# Patient Record
Sex: Female | Born: 1975
Health system: Southern US, Community
[De-identification: ages and names within clinical notes are randomized; demographics above are authoritative.]

## PROBLEM LIST (undated history)

## (undated) DIAGNOSIS — M1990 Unspecified osteoarthritis, unspecified site: Secondary | ICD-10-CM

## (undated) DIAGNOSIS — K219 Gastro-esophageal reflux disease without esophagitis: Secondary | ICD-10-CM

## (undated) DIAGNOSIS — Z87898 Personal history of other specified conditions: Secondary | ICD-10-CM

## (undated) DIAGNOSIS — D131 Benign neoplasm of stomach: Secondary | ICD-10-CM

## (undated) DIAGNOSIS — E119 Type 2 diabetes mellitus without complications: Secondary | ICD-10-CM

## (undated) DIAGNOSIS — R112 Nausea with vomiting, unspecified: Secondary | ICD-10-CM

## (undated) DIAGNOSIS — E8881 Metabolic syndrome: Secondary | ICD-10-CM

## (undated) DIAGNOSIS — M7541 Impingement syndrome of right shoulder: Secondary | ICD-10-CM

## (undated) DIAGNOSIS — R7302 Impaired glucose tolerance (oral): Secondary | ICD-10-CM

## (undated) DIAGNOSIS — K5900 Constipation, unspecified: Secondary | ICD-10-CM

## (undated) DIAGNOSIS — K635 Polyp of colon: Secondary | ICD-10-CM

## (undated) DIAGNOSIS — E785 Hyperlipidemia, unspecified: Secondary | ICD-10-CM

## (undated) DIAGNOSIS — B0089 Other herpesviral infection: Secondary | ICD-10-CM

## (undated) DIAGNOSIS — T7840XA Allergy, unspecified, initial encounter: Secondary | ICD-10-CM

## (undated) DIAGNOSIS — G4700 Insomnia, unspecified: Secondary | ICD-10-CM

## (undated) DIAGNOSIS — F419 Anxiety disorder, unspecified: Secondary | ICD-10-CM

## (undated) DIAGNOSIS — Z8639 Personal history of other endocrine, nutritional and metabolic disease: Secondary | ICD-10-CM

## (undated) DIAGNOSIS — G43909 Migraine, unspecified, not intractable, without status migrainosus: Secondary | ICD-10-CM

## (undated) DIAGNOSIS — F429 Obsessive-compulsive disorder, unspecified: Secondary | ICD-10-CM

## (undated) DIAGNOSIS — E669 Obesity, unspecified: Secondary | ICD-10-CM

## (undated) DIAGNOSIS — S0300XA Dislocation of jaw, unspecified side, initial encounter: Secondary | ICD-10-CM

## (undated) DIAGNOSIS — Z9889 Other specified postprocedural states: Secondary | ICD-10-CM

## (undated) DIAGNOSIS — E038 Other specified hypothyroidism: Secondary | ICD-10-CM

## (undated) DIAGNOSIS — E559 Vitamin D deficiency, unspecified: Secondary | ICD-10-CM

## (undated) DIAGNOSIS — I10 Essential (primary) hypertension: Secondary | ICD-10-CM

## (undated) HISTORY — PX: REDUCTION MAMMAPLASTY: SUR839

## (undated) HISTORY — DX: Hyperlipidemia, unspecified: E78.5

## (undated) HISTORY — PX: BREAST BIOPSY: SHX20

## (undated) HISTORY — DX: Benign neoplasm of stomach: D13.1

## (undated) HISTORY — PX: BREAST EXCISIONAL BIOPSY: SUR124

## (undated) HISTORY — DX: Polyp of colon: K63.5

## (undated) HISTORY — DX: Allergy, unspecified, initial encounter: T78.40XA

## (undated) HISTORY — DX: Vitamin D deficiency, unspecified: E55.9

## (undated) HISTORY — PX: WISDOM TOOTH EXTRACTION: SHX21

---

## 2006-01-11 HISTORY — PX: LASIK: SHX215

## 2008-02-12 HISTORY — PX: OTHER SURGICAL HISTORY: SHX169

## 2008-05-11 HISTORY — PX: BREAST REDUCTION SURGERY: SHX8

## 2010-01-11 HISTORY — PX: CARPAL TUNNEL RELEASE: SHX101

## 2010-11-23 ENCOUNTER — Emergency Department (HOSPITAL_COMMUNITY)
Admission: EM | Admit: 2010-11-23 | Discharge: 2010-11-23 | Disposition: A | Discharge status: Home / Self Care | Payer: 59 | Source: Home / Self Care

## 2010-11-23 ENCOUNTER — Encounter: Payer: Self-pay | Admitting: *Deleted

## 2010-11-23 DIAGNOSIS — S93499A Sprain of other ligament of unspecified ankle, initial encounter: Secondary | ICD-10-CM

## 2010-11-23 DIAGNOSIS — S86019A Strain of unspecified Achilles tendon, initial encounter: Secondary | ICD-10-CM

## 2010-11-23 HISTORY — DX: Essential (primary) hypertension: I10

## 2010-11-23 MED ORDER — IBUPROFEN 800 MG PO TABS: 800.0000 mg | ORAL_TABLET | Freq: Three times a day (TID) | ORAL | Status: AC

## 2010-11-23 NOTE — ED Notes (Signed)
Pt c/o pain to left heel that radiates up into her calf.  States last pm she went to run after her 37 year old and felt something pull in the back of her ankle that caused a burning pain up into her calf.  Has continued to have pain especially with walking.

## 2010-11-23 NOTE — ED Provider Notes (Signed)
History     CSN: [id number] Arrival date & time: 11/23/2010  3:58 PM   None     Chief Complaint  Patient presents with  . Ankle Injury    (Consider location/radiation/quality/duration/timing/severity/associated sxs/prior treatment) HPI Comments: Pt states she took of running after her 37 yo nephew and suddenly felt a pop and severe pain in the back of her Lt ankle. She is able to walk on her toes but is painful to walk normally since.   Patient is a 35 y.o. female presenting with lower extremity injury. The history is provided by the patient.  Ankle Injury This is a new problem. The current episode started 12 to 24 hours ago. The problem occurs constantly. The problem has not changed since onset.Pertinent negatives include no chest pain and no shortness of breath. The symptoms are aggravated by bending. The symptoms are relieved by rest. She has tried nothing for the symptoms.    Past Medical History  Diagnosis Date  . Hypertension   . Thyroid disease     Past Surgical History  Procedure Date  . Breast reduction surgery   . Carpal tunnel release     bilateral    No family history on file.  History  Substance Use Topics  . Smoking status: Never Smoker   . Smokeless tobacco: Not on file  . Alcohol Use: Yes     social    OB History    Grav Para Term Preterm Abortions TAB SAB Ect Mult Living                  Review of Systems  Respiratory: Negative for shortness of breath.   Cardiovascular: Negative for chest pain.  Musculoskeletal: Negative for back pain and joint swelling.  Skin: Negative for color change, rash and wound.    Allergies  Review of patient's allergies indicates no known allergies.  Home Medications   Current Outpatient Rx  Name Route Sig Dispense Refill  . BENAZEPRIL-HYDROCHLOROTHIAZIDE 20-12.5 MG PO TABS Oral Take 1 tablet by mouth daily.      Marland Kitchen LEVOTHYROXINE SODIUM 100 MCG PO TABS Oral Take 100 mcg by mouth daily.      Azzie Roup ACE-ETH  ESTRAD-FE 1-20 MG-MCG PO TABS Oral Take 1 tablet by mouth daily.      . IBUPROFEN 800 MG PO TABS Oral Take 1 tablet (800 mg total) by mouth 3 (three) times daily. 15 tablet 0    BP 132/89  Pulse 97  Temp(Src) 98 F (36.7 C) (Oral)  Resp 16  SpO2 97%  Physical Exam  Nursing note and vitals reviewed. Constitutional: She appears well-developed and well-nourished. No distress.  HENT:  Head: Normocephalic and atraumatic.  Cardiovascular: Normal rate, regular rhythm and normal heart sounds.   Pulmonary/Chest: Effort normal and breath sounds normal. No respiratory distress.  Musculoskeletal:       Left ankle: She exhibits decreased range of motion (decreased dorsiflexion due to pain). She exhibits no swelling, no ecchymosis, no deformity and normal pulse. no tenderness. No lateral malleolus, no medial malleolus, no AITFL, no CF ligament, no posterior TFL, no head of 5th metatarsal and no proximal fibula tenderness found. Achilles tendon exhibits pain. Achilles tendon exhibits no defect and normal Thompson's test results.       Left foot: She exhibits normal range of motion, no tenderness, no bony tenderness, no swelling, normal capillary refill and no deformity.  Neurological: She is alert.  Skin: Skin is warm and dry. No rash noted.  Psychiatric: She has a normal mood and affect.    ED Course  Procedures (including critical care time)  Labs Reviewed - No data to display No results found.   1. Achilles tendon sprain       MDM          Melody Comas, PA 11/23/10 1812

## 2010-11-24 NOTE — ED Provider Notes (Signed)
Medical screening examination/treatment/procedure(s) were performed by non-physician practitioner and as supervising physician I was immediately available for consultation/collaboration.   [facility]; MD   Sharin Grave, MD 11/24/10 [phone]

## 2010-12-07 ENCOUNTER — Encounter: Payer: Self-pay | Admitting: Internal Medicine

## 2010-12-07 ENCOUNTER — Other Ambulatory Visit: Payer: 59 | Admitting: Internal Medicine

## 2010-12-07 DIAGNOSIS — Z0000 Encounter for general adult medical examination without abnormal findings: Secondary | ICD-10-CM

## 2010-12-07 LAB — CBC WITH DIFFERENTIAL/PLATELET
Basophils Absolute: 0 10*3/uL (ref 0.0–0.1)
Eosinophils Absolute: 0.1 10*3/uL (ref 0.0–0.7)
Lymphocytes Relative: 23 % (ref 12–46)
Lymphs Abs: 2 10*3/uL (ref 0.7–4.0)
MCH: 30.2 pg (ref 26.0–34.0)
Neutrophils Relative %: 70 % (ref 43–77)
Platelets: 357 10*3/uL (ref 150–400)
RBC: 4.93 MIL/uL (ref 3.87–5.11)
RDW: 14.2 % (ref 11.5–15.5)
WBC: 8.8 10*3/uL (ref 4.0–10.5)

## 2010-12-07 LAB — COMPREHENSIVE METABOLIC PANEL
ALT: 12 U/L (ref 0–35)
AST: 18 U/L (ref 0–37)
CO2: 25 mEq/L (ref 19–32)
Creat: 0.84 mg/dL (ref 0.50–1.10)
Sodium: 139 mEq/L (ref 135–145)
Total Bilirubin: 0.5 mg/dL (ref 0.3–1.2)
Total Protein: 7.3 g/dL (ref 6.0–8.3)

## 2010-12-07 LAB — LIPID PANEL
Cholesterol: 248 mg/dL — ABNORMAL HIGH (ref 0–200)
LDL Cholesterol: 140 mg/dL — ABNORMAL HIGH (ref 0–99)
Total CHOL/HDL Ratio: 4.9 Ratio
VLDL: 57 mg/dL — ABNORMAL HIGH (ref 0–40)

## 2010-12-07 LAB — TSH: TSH: 0.668 u[IU]/mL (ref 0.350–4.500)

## 2010-12-08 LAB — VITAMIN D 25 HYDROXY (VIT D DEFICIENCY, FRACTURES): Vit D, 25-Hydroxy: 28 ng/mL — ABNORMAL LOW (ref 30–89)

## 2010-12-10 ENCOUNTER — Encounter: Payer: Self-pay | Admitting: Internal Medicine

## 2011-01-07 ENCOUNTER — Encounter: Payer: Self-pay | Admitting: Internal Medicine

## 2011-01-07 ENCOUNTER — Ambulatory Visit (INDEPENDENT_AMBULATORY_CARE_PROVIDER_SITE_OTHER): Payer: 59 | Admitting: Internal Medicine

## 2011-01-07 VITALS — BP 116/78 | HR 80 | Temp 98.3°F | Ht 64.5 in | Wt 237.0 lb

## 2011-01-07 DIAGNOSIS — E785 Hyperlipidemia, unspecified: Secondary | ICD-10-CM

## 2011-01-07 DIAGNOSIS — R7302 Impaired glucose tolerance (oral): Secondary | ICD-10-CM | POA: Insufficient documentation

## 2011-01-07 DIAGNOSIS — E669 Obesity, unspecified: Secondary | ICD-10-CM | POA: Insufficient documentation

## 2011-01-07 DIAGNOSIS — E039 Hypothyroidism, unspecified: Secondary | ICD-10-CM | POA: Insufficient documentation

## 2011-01-07 DIAGNOSIS — R7309 Other abnormal glucose: Secondary | ICD-10-CM

## 2011-01-07 DIAGNOSIS — Z0000 Encounter for general adult medical examination without abnormal findings: Secondary | ICD-10-CM

## 2011-01-07 DIAGNOSIS — Z131 Encounter for screening for diabetes mellitus: Secondary | ICD-10-CM

## 2011-01-07 LAB — POCT URINALYSIS DIPSTICK
Bilirubin, UA: NEGATIVE
Blood, UA: NEGATIVE
Leukocytes, UA: NEGATIVE
Nitrite, UA: NEGATIVE
Protein, UA: NEGATIVE
Urobilinogen, UA: NEGATIVE
pH, UA: 5.5

## 2011-01-07 NOTE — Patient Instructions (Signed)
Continue with same dose of Synthroid. Begin Lipitor 10 mg daily and recheck fasting lipid panel liver functions with office visit in 6 months.

## 2011-01-07 NOTE — Progress Notes (Signed)
Subjective:    Patient ID: Dana Washington, female    DOB: Jan 25, 1975, 35 y.o.   MRN: [id number]  HPI 35 year old white female moved here recently from [place]. 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 She has a sister who lives here. Parents are to move here soon. Patient was told by physician in [place] that she had hyperlipidemia. She was tried on Pravachol for a while but subsequently discontinued it. Apparently this is hereditary from her father. Also his been on Cymbalta 30 mg daily off and on for the past few years. She had weaned off of it but feels she needs to go back on it since moving was somewhat stressful. She recently purchased a house but was living temporarily with her sister prior to purchasing a house. Patient completed 4 years of college. Does not smoke or consume alcohol. History of hypothyroidism currently on Synthroid 0.1 mg daily. Had breast reduction surgery may 2010. Subsequently had bilateral CTR February 2012. Lasik surgery 2008. Hx of Hashimoto's thyroiditis with resultant hypothyroidism. Family history of early onset breast cancer. History of vitamin D deficiency. Benign Lymph node biopsy February 2010.   Family history father with history of prostate cancer, mother with history of skin cancer. Sister with history of migraine headaches.  Social history patient is single with no children    Review of Systems  Constitutional: Negative.   HENT:       Negative  Eyes:       History of Lasik surgery  Cardiovascular:       History of palpitations, history of high blood pressure  Gastrointestinal:       History of constipation  Genitourinary:       Takes oral contraceptives  Neurological: Positive for headaches.  Hematological: Negative.   Psychiatric/Behavioral:       History of anxiety       Objective:   Physical Exam  Vitals reviewed. Constitutional: She is oriented to person, place, and time.  She appears well-developed and well-nourished. No distress.  HENT:  Head: Normocephalic and atraumatic.  Right Ear: External ear normal.  Left Ear: External ear normal.  Mouth/Throat: Oropharynx is clear and moist. No oropharyngeal exudate.  Eyes: Conjunctivae and EOM are normal. Pupils are equal, round, and reactive to light. No scleral icterus.  Neck: Neck supple. No JVD present. No thyromegaly present.  Cardiovascular: Normal rate, regular rhythm and normal heart sounds.   No murmur heard. Pulmonary/Chest: Effort normal and breath sounds normal.       Breast exam: has nodules each breast to the left of each nipple about the size of a nickel. Patient says this is been diagnosed as bilateral Seromas  Abdominal: Soft. Bowel sounds are normal. She exhibits no mass. There is no tenderness.  Genitourinary:       Deferred  Musculoskeletal: Normal range of motion. She exhibits no edema.  Lymphadenopathy:    She has no cervical adenopathy.  Neurological: She is alert and oriented to person, place, and time. She has normal reflexes. No cranial nerve deficit. Coordination normal.  Skin: Skin is warm and dry. No rash noted.  Psychiatric: She has a normal mood and affect. Her behavior is normal. Judgment and thought content normal.          Assessment & Plan:  History of Hashimoto's thyroiditis according to old medical records now with hypothyroidism treated with thyroid replacement therapy  Obesity  History of breast reduction surgery  History of anxiety  treated with Cymbalta-refill Cymbalta for 12 months  Hyperlipidemia-likely familial  Plan: Patient will start Lipitor generic 10 mg daily and return in 6 months for fasting lipid panel liver functions and office visit. I believe Lipitor would be a better choice and Pravachol because it would lower both total cholesterol and triglycerides as she has elevation of both.  History of vitamin D deficiency-needs to take 2000 units vitamin D 3  daily over-the-counter.

## 2011-01-18 ENCOUNTER — Encounter: Payer: Self-pay | Admitting: Internal Medicine

## 2011-01-18 ENCOUNTER — Ambulatory Visit (INDEPENDENT_AMBULATORY_CARE_PROVIDER_SITE_OTHER): Payer: 59 | Admitting: Internal Medicine

## 2011-01-18 VITALS — BP 128/84 | HR 80 | Temp 98.5°F | Wt 238.0 lb

## 2011-01-18 DIAGNOSIS — J40 Bronchitis, not specified as acute or chronic: Secondary | ICD-10-CM

## 2011-01-18 DIAGNOSIS — J45909 Unspecified asthma, uncomplicated: Secondary | ICD-10-CM

## 2011-01-20 ENCOUNTER — Encounter: Payer: Self-pay | Admitting: Internal Medicine

## 2011-01-20 NOTE — Progress Notes (Signed)
  Subjective:    Patient ID: Dana Washington, female    DOB: 05-06-1975, 36 y.o.   MRN: [id number]  HPI Patient has been on call a lot over the new year holiday it has come down with respiratory infection around New Year's Eve. Has cough and congestion. Some discolored sputum production. Some shortness of breath. Coworkers are complaining about her cough. No fever or shaking chills. No myalgias. Has had influenza immunization at work.    Review of Systems     Objective:   Physical Exam HEENT exam: TMs and pharynx are clear; neck supple without significant adenopathy; thousand nasally congested; coughing a lot in exam room; chest clear        Assessment & Plan:  Asthmatic bronchitis  Plan: Levaquin 500 milligrams daily for 10 days. Albuterol inhaler 2 sprays by mouth 4 times a day for 2 weeks. Hycodan 8 ounces 1 teaspoon by mouth every 6 hours when necessary cough. Sterapred DS 10 mg 6 day dosepak.

## 2011-01-20 NOTE — Patient Instructions (Signed)
Take antibiotics as prescribed daily with a meal. Take cough syrup as needed sparingly for cough. Take prednisone as directed for bronchospasm. Call if not better in 10 days or sooner if worse.

## 2011-03-11 ENCOUNTER — Encounter: Payer: Self-pay | Admitting: Internal Medicine

## 2011-03-11 ENCOUNTER — Ambulatory Visit (INDEPENDENT_AMBULATORY_CARE_PROVIDER_SITE_OTHER): Payer: 59 | Admitting: Internal Medicine

## 2011-03-11 VITALS — BP 116/80 | HR 72 | Temp 97.8°F | Wt 246.0 lb

## 2011-03-11 DIAGNOSIS — R110 Nausea: Secondary | ICD-10-CM

## 2011-03-11 DIAGNOSIS — G43909 Migraine, unspecified, not intractable, without status migrainosus: Secondary | ICD-10-CM

## 2011-03-11 MED ORDER — ONDANSETRON HCL 4 MG/2ML IJ SOLN
4.0000 mg | Freq: Once | INTRAMUSCULAR | Status: AC
  Administered 2011-03-11: 4 mg via INTRAMUSCULAR

## 2011-03-30 ENCOUNTER — Telehealth: Payer: Self-pay | Admitting: Internal Medicine

## 2011-03-30 MED ORDER — FROVATRIPTAN SUCCINATE 2.5 MG PO TABS: 2.5000 mg | ORAL_TABLET | ORAL | Status: AC | PRN

## 2011-03-30 NOTE — Telephone Encounter (Signed)
Cannot tolerate Imitrex. Call in Frova tablets to hospital pharmacy. Rx written for 90 day supply with one refill.

## 2011-04-08 ENCOUNTER — Ambulatory Visit (INDEPENDENT_AMBULATORY_CARE_PROVIDER_SITE_OTHER): Payer: 59 | Admitting: Internal Medicine

## 2011-04-08 ENCOUNTER — Encounter: Payer: Self-pay | Admitting: Internal Medicine

## 2011-04-08 VITALS — BP 112/66 | HR 80 | Temp 98.3°F

## 2011-04-08 DIAGNOSIS — R51 Headache: Secondary | ICD-10-CM

## 2011-04-08 DIAGNOSIS — Z8669 Personal history of other diseases of the nervous system and sense organs: Secondary | ICD-10-CM

## 2011-04-08 NOTE — Patient Instructions (Addendum)
Try Zomig nasal spray for migraine headache instead of Frova. Try one Fioricet tablet as a rescue medication. Had onset of headache try to Aleve tablets and didn't move to triptan medication if no relief within the hour.

## 2011-04-08 NOTE — Progress Notes (Signed)
  Subjective:    Patient ID: Dana Washington, female    DOB: 1975/12/20, 36 y.o.   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 Currently on Topamax 50 mg daily. Currently having only one migraine a week worse previously she was having 3-4 week. She tried Frova tablets after trying Imitrex nasal spray which gave her chest pain. Imitrex nasal spray gave her better relief but the chest pain frightened her. Frova did fairly well but didn't give her complete relief of the headache. She did try Fioricet as a rescue medication. She took wanted and said it helped a bit but then when she took 2 she felt "drunk". She still not clear if she can mix ibuprofen with Frova with Fioricet. Explained how she could use these different medications today. At onset of headache she should take 2 Aleve or 600 mg of ibuprofen. Within the hour she's not getting relief, she should try Frova or Zomig nasal spray which I have prescribed today 5 mg. If no relief or incomplete relief after that she should then go to Fioricet as a rescue medication.    Review of Systems     Objective:   Physical Exam patient not examined today. Spent 20 minutes talking with her about how to manage these headaches and what medications can be mixed safely. She does have Phenergan on hand for nausea. Does feel that Topamax is helping a great deal.        Assessment & Plan:  Migraine headaches  Plan: Increase Topamax to 75 mg daily written prescription for #90 with one refill. Return in 2 months for followup on management. Try Zomig nasal spray 5 mg instead of Frova to see if pain relief will be better. For rescue medicine, try Fioricet 1 tablet instead of 2. At onset of headache try 2 Aleve and then proceed to triptan medication if not better within the hour.

## 2011-04-09 NOTE — Patient Instructions (Signed)
Start Topamax 25 mg daily increasing after one to 2 weeks to 50 mg daily. Return in 4 weeks. Try Imitrex tablets at onset of headache. Try Fioricet as rescue medication one or 2 tablets if headache not relieved by Imitrex

## 2011-04-09 NOTE — Progress Notes (Signed)
  Subjective:    Patient ID: Trellis Moment, female    DOB: [birth year], 36 y.o.   MRN: [id number]  HPI 36 year old white female presents today with a headache associated with photophobiaand nausea.some years ago she saw a neurologist and apparently was diagnosed with migraine headaches and then they went away for a while. Has been under some stress recently with moving and with excepting a new job. Has been having very frequent headaches.    Review of Systems     Objective:   Physical Exam funduscopic exam is benign. Disc are sharp and flat bilaterally. Brief neurological exam shows no focal deficits.Spent 25 minutes talking with patient about headaches, triggers for headaches, and management of headaches        Assessment & Plan:  Migraine headache  Plan: Patient may try Imitrex 100 mg tablet at onset of migraine. Since they have been very frequent, we  want to use prophylaxis with Topamax starting initially with 25 mg increasing to 50 mg after one to 2 weeks.return in about 4 weeks for reevaluation. For rescue medication for headache not relieved with Imitrex, try Fioricet 1 or 2 tablets every 4-6 hours not to exceed 6 tablets in 24 hours.

## 2011-04-29 ENCOUNTER — Telehealth: Payer: Self-pay | Admitting: Internal Medicine

## 2011-04-29 DIAGNOSIS — G43909 Migraine, unspecified, not intractable, without status migrainosus: Secondary | ICD-10-CM

## 2011-04-29 NOTE — Telephone Encounter (Signed)
Called in Sterapred dosepack for pt.  Advised pt to keep prev scheduled appt.

## 2011-04-29 NOTE — Telephone Encounter (Signed)
Call in Sterapred DS 10 mg 6 day dosepack Take as directed. And keep followup appointment.

## 2011-06-11 ENCOUNTER — Ambulatory Visit (INDEPENDENT_AMBULATORY_CARE_PROVIDER_SITE_OTHER): Payer: 59 | Admitting: Internal Medicine

## 2011-06-11 ENCOUNTER — Encounter: Payer: Self-pay | Admitting: Internal Medicine

## 2011-06-11 VITALS — BP 122/76 | HR 92 | Temp 98.6°F | Wt 252.0 lb

## 2011-06-11 DIAGNOSIS — F418 Other specified anxiety disorders: Secondary | ICD-10-CM | POA: Insufficient documentation

## 2011-06-11 DIAGNOSIS — Z8669 Personal history of other diseases of the nervous system and sense organs: Secondary | ICD-10-CM

## 2011-06-11 DIAGNOSIS — G43909 Migraine, unspecified, not intractable, without status migrainosus: Secondary | ICD-10-CM

## 2011-06-11 DIAGNOSIS — F32A Depression, unspecified: Secondary | ICD-10-CM | POA: Insufficient documentation

## 2011-06-11 DIAGNOSIS — F329 Major depressive disorder, single episode, unspecified: Secondary | ICD-10-CM

## 2011-06-11 DIAGNOSIS — I10 Essential (primary) hypertension: Secondary | ICD-10-CM

## 2011-06-11 DIAGNOSIS — F3289 Other specified depressive episodes: Secondary | ICD-10-CM

## 2011-06-11 DIAGNOSIS — E669 Obesity, unspecified: Secondary | ICD-10-CM

## 2011-06-11 MED ORDER — DULOXETINE HCL 60 MG PO CPEP: 60.0000 mg | ORAL_CAPSULE | Freq: Every day | ORAL | Status: AC

## 2011-06-11 NOTE — Progress Notes (Signed)
  Subjective:    Patient ID: Dana Washington, female    DOB: [birth year], 36 y.o.   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 Also has been a bit depressed recently. Somewhat homesick for [facility]. Her sister lives here. Patient recently bought a house here. However still misses her friends and family. Is working a great deal. Is on call this weekend. Has had fewer migraines on Topamax but the migraines seem to be more severe. They're waking her up in the middle of the night. She has missed one day of work since last visit. On further questioning, she is eating only a bowl of cereal sometimes for dinner. I suspect she is getting somewhat hypoglycemic and waking up in the middle of the night with a migraine triggered by that. Blood pressures under good control on current regimen. Benazepril/HCTZ 20/12.5 refilled today for when necessary one year. She says her sister thinks her Cymbalta needs to be increased. Think she is depressed.    Review of Systems     Objective:   Physical Exam somewhat tearful in the office today talking about her hometown. Chest clear; cardiac exam regular rate and rhythm; extremities without edema        Assessment & Plan:  Hypertension  Obesity  Migraine headaches  Depression  Plan: Patient has appointment have physical exam next month so we can followup with headaches and other issues at that time. Increase Cymbalta to 60 mg daily. New prescription written. Topamax 100 mg at bedtime new prescription written #90 with with 2 refills. Patient is to try to eat more calories for supper and less calories for lunch. Keep headache diary until return.

## 2011-06-11 NOTE — Patient Instructions (Signed)
Take Topamax 100 mg at bedtime. Increase Cymbalta from 30-60 mg daily. Return next month for physical exam. Continue antihypertensive medication. Try to eat more calories at suppertime

## 2011-06-14 ENCOUNTER — Encounter: Payer: Self-pay | Admitting: Internal Medicine

## 2011-06-14 ENCOUNTER — Ambulatory Visit (INDEPENDENT_AMBULATORY_CARE_PROVIDER_SITE_OTHER): Payer: 59 | Admitting: Internal Medicine

## 2011-06-14 DIAGNOSIS — L259 Unspecified contact dermatitis, unspecified cause: Secondary | ICD-10-CM

## 2011-06-14 DIAGNOSIS — L309 Dermatitis, unspecified: Secondary | ICD-10-CM

## 2011-06-14 NOTE — Progress Notes (Signed)
  Subjective:    Patient ID: Dana Washington, female    DOB: 02-May-1975, 36 y.o.   MRN: [id number]  HPI 35 year old white female with history of migraine headaches and depression was just seen here May 31 for followup on headaches and depression. 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 She noticed an area of erythema adjacent to her left nasal bridge running under her left eye. She is not aware of any recent insect bite. A few weeks ago she did have contact dermatitis consistent with poison ivy on her arms. Has not been working in the yard lately. She did clean up her house over the weekend. Says the rash feels irritated with a burning sensation. No fever.    Review of Systems     Objective:   Physical Exam  there is an area of erythema running from left para nasal bridge area under left eye. There is some swelling adjacent to the left nasal bridge. In that area there is a tiny weeping area. No other distinct vesicles.        Assessment & Plan:  Possible Herpes zoster  Possible Herpes simplex  Possible insect bite with cellulitis  Plan: Valtrex generic 1 g 3 times daily for 7 days. Levaquin 500 milligrams daily for 7 days. Patient is to return tomorrow afternoon for brief recheck. Currently her eye is not involved.

## 2011-06-14 NOTE — Patient Instructions (Addendum)
Take Valtrex 1 g 3 times daily for 7 days. Start Levaquin 500 mg daily with a meal for 7 days. We're attempting to cover any herpes infection as well as cellulitis. Return tomorrow for recheck.

## 2011-06-26 ENCOUNTER — Ambulatory Visit (INDEPENDENT_AMBULATORY_CARE_PROVIDER_SITE_OTHER): Payer: 59 | Admitting: Family Medicine

## 2011-06-26 VITALS — BP 127/84 | HR 65 | Temp 98.5°F | Resp 16 | Ht 64.75 in | Wt 254.0 lb

## 2011-06-26 DIAGNOSIS — H5789 Other specified disorders of eye and adnexa: Secondary | ICD-10-CM

## 2011-06-26 DIAGNOSIS — H579 Unspecified disorder of eye and adnexa: Secondary | ICD-10-CM

## 2011-06-26 MED ORDER — POLYMYXIN B-TRIMETHOPRIM 10000-0.1 UNIT/ML-% OP SOLN: 1.0000 [drp] | OPHTHALMIC | Status: DC

## 2011-06-26 NOTE — Progress Notes (Signed)
Patient Name: Dana Washington Date of Birth: 1975/09/23 Medical Record Number: [id number] Gender: female Date of Encounter: 06/26/2011  History of Present Illness:  Dana Washington is a 36 y.o. very pleasant female patient who presents with the following:  Awoke with right eye swelling this morning.  The swelling seems to have gone down some since she woke.  It eyes is "burning" but there is no crusting, no lesions. She does note increased tearing.  No corrective lenses- vision is at baseline.  She did cut the grass yesterday . Marland Kitchen It was hot and dusty but she did not have sunscreen on her face.   No photophobia, no pain.  Patient Active Problem List  Diagnosis  . Hyperlipidemia  . Impaired glucose tolerance  . Obesity  . Hypothyroidism  . History of migraine headaches  . Depression   Past Medical History  Diagnosis Date  . Hypertension   . Thyroid disease   . Vitamin d deficiency   . Hyperlipidemia    Past Surgical History  Procedure Date  . Breast reduction surgery   . Carpal tunnel release     bilateral  . Biopsy of lymph node    History  Substance Use Topics  . Smoking status: Never Smoker   . Smokeless tobacco: Never Used  . Alcohol Use: Yes     social   Family History  Problem Relation Age of Onset  . Heart disease Maternal Grandfather   . Heart disease Paternal Grandmother    No Known Allergies  Medication list has been reviewed and updated.  Prior to Admission medications   Medication Sig Start Date End Date Taking? Authorizing Provider  atorvastatin (LIPITOR) 10 MG tablet Take 10 mg by mouth daily.     Yes Historical Provider, MD  benazepril-hydrochlorthiazide (LOTENSIN HCT) 20-12.5 MG per tablet Take 1 tablet by mouth daily.     Yes Historical Provider, MD  butalbital-acetaminophen-caffeine (ESGIC PLUS) 50-500-40 MG per tablet Take 1 tablet by mouth every 4 (four) hours as needed.   Yes Historical Provider, MD  DULoxetine (CYMBALTA) 60 MG  capsule Take 1 capsule (60 mg total) by mouth daily. 06/11/11 06/10/12 Yes Margaree Mackintosh, MD  levothyroxine (SYNTHROID, LEVOTHROID) 100 MCG tablet Take 100 mcg by mouth daily.     Yes Historical Provider, MD  norethindrone-ethinyl estradiol (JUNEL FE,GILDESS FE,LOESTRIN FE) 1-20 MG-MCG tablet Take 1 tablet by mouth daily.     Yes Historical Provider, MD  topiramate (TOPAMAX) 50 MG tablet Take 50 mg by mouth daily.   Yes Historical Provider, MD  vitamin E (VITAMIN E) 400 UNIT capsule Take 400 Units by mouth daily.     Yes Historical Provider, MD  frovatriptan (FROVA) 2.5 MG tablet Take 1 tablet (2.5 mg total) by mouth as needed for migraine. If recurs, may repeat after 2 hours. Max of 3 tabs in 24 hours. 03/30/11 03/29/12  Margaree Mackintosh, MD    Review of Systems:  As per HPI- otherwise negative.  Physical Examination: Filed Vitals:   06/26/11 0926  BP: 127/84  Pulse: 65  Temp: 98.5 F (36.9 C)  Resp: 16   Filed Vitals:   06/26/11 0926  Height: 5' 4.75" (1.645 m)  Weight: 254 lb (115.214 kg)   Body mass index is 42.59 kg/(m^2). Ideal Body Weight: Weight in (lb) to have BMI = 25: 148.8   GEN: WDWN, NAD, Non-toxic, A & O x 3, obese HEENT: Atraumatic, Normocephalic. Neck supple. No masses, No LAD. Eyes:  PEERL, EOMI.  Fundoscopic exam wnl, fluorescin normal.  There is slight swelling of the medial corner of her eye, but this is minimal.  The lid appears normal, no evidence of stye.  conjunctivae are wnl.  Ears and Nose: No external deformity. CV: RRR, No M/G/R. No JVD. No thrill. No extra heart sounds. PULM: CTA B, no wheezes, crackles, rhonchi. No retractions. No resp. distress. No accessory muscle use. EXTR: No c/c/e NEURO Normal gait.  PSYCH: Normally interactive. Conversant. Not depressed or anxious appearing.  Calm demeanor.   Assessment and Plan: 1. Eye irritation  trimethoprim-polymyxin b (POLYTRIM) ophthalmic solution  Dana Washington has minimal swelling of the medial corner of her  right eye which is already significantly better.  No treatment may be needed.  She has an rx for polytrim as above, which she can use if her symptoms persists.  However, if she is well within the next day or so she may not even need to fill the drops.  If she develops any worsening or new symptoms she will give me a call.    Abbe Amsterdam, MD

## 2011-07-02 ENCOUNTER — Other Ambulatory Visit: Payer: Self-pay | Admitting: Internal Medicine

## 2011-07-03 ENCOUNTER — Ambulatory Visit (INDEPENDENT_AMBULATORY_CARE_PROVIDER_SITE_OTHER): Payer: 59 | Admitting: Family Medicine

## 2011-07-03 VITALS — BP 122/84 | HR 74 | Temp 98.8°F | Resp 16 | Ht 64.25 in | Wt 255.0 lb

## 2011-07-03 DIAGNOSIS — H5710 Ocular pain, unspecified eye: Secondary | ICD-10-CM

## 2011-07-03 DIAGNOSIS — T6391XA Toxic effect of contact with unspecified venomous animal, accidental (unintentional), initial encounter: Secondary | ICD-10-CM

## 2011-07-03 DIAGNOSIS — T63441A Toxic effect of venom of bees, accidental (unintentional), initial encounter: Secondary | ICD-10-CM

## 2011-07-03 MED ORDER — PREDNISONE 50 MG PO TABS: ORAL_TABLET | ORAL | Status: AC

## 2011-07-03 MED ORDER — METHYLPREDNISOLONE SODIUM SUCC 125 MG IJ SOLR
125.0000 mg | Freq: Once | INTRAMUSCULAR | Status: AC
  Administered 2011-07-03: 125 mg via INTRAMUSCULAR

## 2011-07-03 MED ORDER — FAMOTIDINE 40 MG PO TABS: 40.0000 mg | ORAL_TABLET | Freq: Two times a day (BID) | ORAL | Status: DC | PRN

## 2011-07-03 MED ORDER — RANITIDINE HCL 150 MG/10ML PO SYRP
150.0000 mg | ORAL_SOLUTION | Freq: Once | ORAL | Status: AC
  Administered 2011-07-03: 150 mg via ORAL

## 2011-07-03 NOTE — Patient Instructions (Signed)
Bee, Wasp, or Hornet Sting   Your caregiver has diagnosed you as having an insect sting. An insect sting appears as a red lump in the skin that sometimes has a tiny hole in the center, or it may have a stinger in the center of the wound. The most common stings are from wasps, hornets and bees.   Individuals have different reactions to insect stings.   A normal reaction may cause pain, swelling, and redness around the sting site.   A localized allergic reaction may cause swelling and redness that extends beyond the sting site.   A large local reaction may continue to develop over the next 12 to 36 hours.   On occasion, the reactions can be severe (anaphylactic reaction). An anaphylactic reaction may cause wheezing; difficulty breathing; chest pain; fainting; raised, itchy, red patches on the skin; a sick feeling to your stomach (nausea); vomiting; cramping; or diarrhea. If you have had an anaphylactic reaction to an insect sting in the past, you are more likely to have one again.   HOME CARE INSTRUCTIONS   With bee stings, a small sac of poison is left in the wound. Brushing across this with something such as a credit card, or anything similar, will help remove this and decrease the amount of the reaction. This same procedure will not help a wasp sting as they do not leave behind a stinger and poison sac.   Apply a cold compress for 10 to 20 minutes every hour for 1 to 2 days, depending on severity, to reduce swelling and itching.   To lessen pain, a paste made of water and baking soda may be rubbed on the bite or sting and left on for 5 minutes.   To relieve itching and swelling, you may use take medication or apply medicated creams or lotions as directed.   Only take over-the-counter or prescription medicines for pain, discomfort, or fever as directed by your caregiver.   Wash the sting site daily with soap and water. Apply antibiotic ointment on the sting site as directed.   If you suffered a severe reaction:   If  you did not require hospitalization, an adult will need to stay with you for 24 hours in case the symptoms return.   You may need to wear a medical bracelet or necklace stating the allergy.   You and your family need to learn when and how to use an anaphylaxis kit or epinephrine injection.   If you have had a severe reaction before, always carry your anaphylaxis kit with you.   SEEK MEDICAL CARE IF:   None of the above helps within 2 to 3 days.   The area becomes red, warm, tender, and swollen beyond the area of the bite or sting.   You have an oral temperature above 102° F (38.9° C).   SEEK IMMEDIATE MEDICAL CARE IF:   You have symptoms of an allergic reaction which are:   Wheezing.   Difficulty breathing.   Chest pain.   Lightheadedness or fainting.   Itchy, raised, red patches on the skin.   Nausea, vomiting, cramping or diarrhea.   ANY OF THESE SYMPTOMS MAY REPRESENT A SERIOUS PROBLEM THAT IS AN EMERGENCY. Do not wait to see if the symptoms will go away. Get medical help right away. Call your local emergency services (911 in [place] DO NOT drive yourself to the hospital.   MAKE SURE YOU:   Understand these instructions.   Will watch your condition.     Will get help right away if you are not doing well or get worse.   Document Released: 12/28/2004 Document Revised: 12/17/2010 Document Reviewed: 06/14/2009   ExitCare® Patient Information ©2012 [employer].

## 2011-07-03 NOTE — Progress Notes (Signed)
  Subjective:    Patient ID: Dana Washington, female    DOB: 04-17-1975, 36 y.o.   MRN: [id number]  HPI Pt was stung by yellowjacket in L eye yesterday.  Pt was outside in garden, also cutting grass Pt thinks that she may have hit yellowjacket nest.  Both pt and dog were stung.  Pt has had significant L eye swelling and secondary LAD.  No LOV.  No headache.  No fevers.  Has been taking scheduled benadryl with minimal relief in itching.  No nuchal rigidity. Some pain with opening jaw on L side.    Review of Systems See HPI, otherwise ROS negative    Objective:   Physical Exam  Constitutional: She appears well-developed and well-nourished.  HENT:  Head: Normocephalic.    Right Ear: External ear normal.  Left Ear: External ear normal.  Mouth/Throat: Oropharynx is clear and moist.       Jaw w/ full ROM   Eyes: Conjunctivae and EOM are normal. Pupils are equal, round, and reactive to light.  Neck: Normal range of motion. Neck supple.  Cardiovascular: Normal rate and regular rhythm.   Pulmonary/Chest: Effort normal.  Abdominal: Soft. Bowel sounds are normal.     L eye peripheral swelling; eye w/ full ROM  PERRL      Assessment & Plan:  Yellowjacket sting:  solumedrol 125 IM x1. pepcid and benadryl.  Prednisone x 5 days.  Infectious red flags discussed.  Follow up in 24 hours.  Handout given.     The patient and/or caregiver has been counseled thoroughly with regard to treatment plan and/or medications prescribed including dosage, schedule, interactions, rationale for use, and possible side effects and they verbalize understanding. Diagnoses and expected course of recovery discussed and will return if not improved as expected or if the condition worsens. Patient and/or caregiver verbalized understanding.

## 2011-07-04 ENCOUNTER — Telehealth: Payer: Self-pay

## 2011-07-04 NOTE — Telephone Encounter (Signed)
DR.NEWTON, PT WOULD LIKE TO INFORM YOU THAT SHE WILL NOT BE ABLE TO COME IN TODAY DUE TO WORK. SHE WOULD LIKE TO LET YOU KNOW THAT SHE  IS DOING BETTER, SWELLING HAS WENT DOWN AND THERE IS NO REDNESS AT THIS TIME. SHE ALSO WOULD LIKE TO THANK YOU FOR DOING SUCH A GREAT JOB.

## 2011-07-06 ENCOUNTER — Other Ambulatory Visit: Payer: 59 | Admitting: Internal Medicine

## 2011-07-06 DIAGNOSIS — Z79899 Other long term (current) drug therapy: Secondary | ICD-10-CM

## 2011-07-06 DIAGNOSIS — E785 Hyperlipidemia, unspecified: Secondary | ICD-10-CM

## 2011-07-06 LAB — HEPATIC FUNCTION PANEL
Alkaline Phosphatase: 82 U/L (ref 39–117)
Bilirubin, Direct: 0.1 mg/dL (ref 0.0–0.3)
Indirect Bilirubin: 0.3 mg/dL (ref 0.0–0.9)
Total Bilirubin: 0.4 mg/dL (ref 0.3–1.2)

## 2011-07-06 LAB — LIPID PANEL
Cholesterol: 187 mg/dL (ref 0–200)
Triglycerides: 174 mg/dL — ABNORMAL HIGH (ref <150)

## 2011-07-08 ENCOUNTER — Other Ambulatory Visit: Payer: 59 | Admitting: Internal Medicine

## 2011-07-09 ENCOUNTER — Ambulatory Visit (INDEPENDENT_AMBULATORY_CARE_PROVIDER_SITE_OTHER): Payer: 59 | Admitting: Internal Medicine

## 2011-07-09 ENCOUNTER — Encounter: Payer: Self-pay | Admitting: Internal Medicine

## 2011-07-09 VITALS — BP 112/70 | HR 76 | Temp 98.4°F | Ht 64.5 in | Wt 255.0 lb

## 2011-07-09 DIAGNOSIS — I10 Essential (primary) hypertension: Secondary | ICD-10-CM

## 2011-07-09 DIAGNOSIS — E785 Hyperlipidemia, unspecified: Secondary | ICD-10-CM

## 2011-07-09 DIAGNOSIS — E039 Hypothyroidism, unspecified: Secondary | ICD-10-CM

## 2011-07-09 DIAGNOSIS — G43909 Migraine, unspecified, not intractable, without status migrainosus: Secondary | ICD-10-CM

## 2011-07-09 DIAGNOSIS — T63481A Toxic effect of venom of other arthropod, accidental (unintentional), initial encounter: Secondary | ICD-10-CM

## 2011-07-09 DIAGNOSIS — IMO0001 Reserved for inherently not codable concepts without codable children: Secondary | ICD-10-CM

## 2011-07-10 DIAGNOSIS — I10 Essential (primary) hypertension: Secondary | ICD-10-CM | POA: Insufficient documentation

## 2011-07-10 NOTE — Patient Instructions (Addendum)
Carry Ana-Kit with you in your car at all times. Use if have recurrent insect stings. Continue Topamax. Continue medications for hypothyroidism, hyperlipidemia and hypertension. Return in 6 months for physical examination.

## 2011-07-10 NOTE — Progress Notes (Signed)
  Subjective:    Patient ID: Trellis Moment, female    DOB: 03/16/1975, 36 y.o.   MRN: [id number]  HPI 36 year old white female in today for six-month recheck of hypertension and hyperlipidemia. Had recent fasting lipid panel which proved to be essentially normal. She was pleased with that. However, on June 21 she was stung by several yellow jackets while mowing her yard. She took some Benadryl. Had swelling of her left eye. Multiple stings on the face. She is  concerned she still has some swelling of her left eyelid. She went to urgent care and was given prednisone to take which she is just finishing up. Urgent care told her she probably needed to get Ana- kit(Epipen). Patient complaining that bites are itchy. Has continued to take Benadryl for itching.  With regard to migraine headache she is now up Topamax 100 mg daily and is doing better with regard to number of headaches per month.    Review of Systems     Objective:   Physical Exam  The patient has mild swelling left eyelid. There is no erythema about her face whatsoever. Has slight swelling left mid eyebrow area. No evidence of cellulitis. No evidence of conjunctivitis.  Chest clear to auscultation; cardiac exam regular rate and rhythm; extremities without edema; neurological exam alert and oriented x3. No focal deficits on brief neurological exam. Tongue is not swollen. Pharynx is clear.       Assessment & Plan:  Multiple yellow jacket stings on face. No evidence of cellulitis  History of migraine headaches-improved with Topamax  Hypertension  Hyperlipidemia  Plan: Patient is to return in 6 months for physical examination. Prescription for Ana-Kit written with when necessary one year refills.

## 2011-08-17 ENCOUNTER — Encounter: Payer: Self-pay | Admitting: Internal Medicine

## 2011-08-17 ENCOUNTER — Ambulatory Visit (INDEPENDENT_AMBULATORY_CARE_PROVIDER_SITE_OTHER): Payer: 59 | Admitting: Internal Medicine

## 2011-08-17 VITALS — BP 100/68 | HR 84 | Temp 97.5°F | Wt 257.0 lb

## 2011-08-17 DIAGNOSIS — L259 Unspecified contact dermatitis, unspecified cause: Secondary | ICD-10-CM

## 2011-08-26 ENCOUNTER — Encounter: Payer: Self-pay | Admitting: Internal Medicine

## 2011-08-26 NOTE — Progress Notes (Signed)
  Subjective:    Patient ID: Dana Washington, female    DOB: 1975-07-27, 36 y.o.   MRN: [id number]  HPI 36 year old white female with history of migraine headaches, depression, obesity, hypertension, hyperlipidemia, hypothyroidism and impaired glucose tolerance in today with rash on her arms. Does her own yard work. Rash is itchy and she thinks it might be poison ivy.    Review of Systems     Objective:   Physical Exam raised red papules with linear distribution both arms. No bullous lesions. No rash on face.        Assessment & Plan:  Contact dermatitis-likely poison ivy  Plan: Sterapred DS 10 mg 12 day dosepak  Atarax 25 mg one or 2 tablets 4 times daily as needed for itching

## 2011-08-26 NOTE — Patient Instructions (Addendum)
Take Sterapred DS 10 mg 12 day dosepak for contact dermatitis as directed. Take Atarax as needed for itching

## 2011-08-30 ENCOUNTER — Other Ambulatory Visit: Payer: Self-pay | Admitting: Internal Medicine

## 2011-08-30 ENCOUNTER — Encounter: Payer: Self-pay | Admitting: Internal Medicine

## 2011-08-30 ENCOUNTER — Ambulatory Visit (INDEPENDENT_AMBULATORY_CARE_PROVIDER_SITE_OTHER): Payer: 59 | Admitting: Internal Medicine

## 2011-08-30 VITALS — BP 116/86 | HR 92 | Temp 97.7°F | Wt 255.0 lb

## 2011-08-30 DIAGNOSIS — B029 Zoster without complications: Secondary | ICD-10-CM

## 2011-08-30 DIAGNOSIS — B0239 Other herpes zoster eye disease: Secondary | ICD-10-CM

## 2011-08-30 NOTE — Patient Instructions (Addendum)
Start Valtrex 1 g 3 times daily for 7 days. We will refer you to ophthalmologist for evaluation

## 2011-08-30 NOTE — Progress Notes (Signed)
  Subjective:    Patient ID: Dana Washington, female    DOB: 02-22-1975, 36 y.o.   MRN: [id number]  HPI 37 year old white female awakened this morning with rash on left temple area. She also has erythematous  papule left eyelid. No drainage from the eye. Says rash feels irritated and is not itchy. She has a history of migraine headaches. No fever. No headache.    Review of Systems     Objective:   Physical Exam several papular blotches left temple area and left eyelid. No vesicles.        Assessment & Plan:  Herpes zoster left face and eyelid  Plan: Ophthalmology consultation to make sure she has no involvement in the eye  Valtrex 1 g 3 times daily for 7 days.

## 2011-09-03 ENCOUNTER — Telehealth: Payer: Self-pay | Admitting: Internal Medicine

## 2011-09-03 NOTE — Telephone Encounter (Signed)
Sp w/pharmacy in [place] - Gabapentin 300 mg #30 tid no refills.  Sp w/patient to advise this has been called in per instructions in message by MJB.

## 2011-09-03 NOTE — Telephone Encounter (Signed)
It has some side effects. May try Gabapentin 300mg  tid #30. Narcotics do not work for nerve root pain.

## 2011-12-08 ENCOUNTER — Other Ambulatory Visit: Payer: Self-pay | Admitting: Internal Medicine

## 2011-12-20 ENCOUNTER — Encounter: Payer: Self-pay | Admitting: Internal Medicine

## 2011-12-20 ENCOUNTER — Ambulatory Visit (INDEPENDENT_AMBULATORY_CARE_PROVIDER_SITE_OTHER): Payer: 59 | Admitting: Internal Medicine

## 2011-12-20 VITALS — BP 130/90 | HR 92 | Temp 98.9°F | Wt 263.0 lb

## 2011-12-20 DIAGNOSIS — F4320 Adjustment disorder, unspecified: Secondary | ICD-10-CM

## 2011-12-20 DIAGNOSIS — B029 Zoster without complications: Secondary | ICD-10-CM

## 2011-12-20 DIAGNOSIS — F419 Anxiety disorder, unspecified: Secondary | ICD-10-CM

## 2011-12-20 DIAGNOSIS — F411 Generalized anxiety disorder: Secondary | ICD-10-CM

## 2011-12-20 DIAGNOSIS — F4321 Adjustment disorder with depressed mood: Secondary | ICD-10-CM

## 2011-12-20 NOTE — Progress Notes (Signed)
  Subjective:    Patient ID: Dana Washington, female    DOB: 1975/04/01, 36 y.o.   MRN: [id number]  HPI 60 white female seen in mid August with dermatitis left face consistent with herpes zoster. Was treated with Valtrex for 7 days and improved. Was sent to ophthalmologist to rule out ophthalmic involvement. She saw Dr. Hazle Quant who said she had no ophthalmic involvement. Since then patient has had some intermittent burning in her left face.  Over Thanksgiving, she had to put her dog to sleep who apparently had had a stroke. This was extremely stressful. She had the dog for 8 years. Yesterday she awakened with a left facial rash once again with similar distribution of the initial outbreak. She had some of her father's Valtrex and begin taking that has had a total of 4 doses. Says the face is itchy and burning.  She is clearly anxious and grieving over loss of her dog.  When she had initial outbreak of shingles, she had a dermatology appointment for some other skin issues and dermatologist thought she also had herpes zoster at that time.    Review of Systems     Objective:   Physical Exam Erythematous macular papular splotchy rash left face. No pustules, no vesicles, no crusting. She has photos on her i-phone from her previous bout showing canal the rash progressed. It included crusting and vesicles. Spoke with patient for 25 minutes about grief reaction from loss of dog and other symptoms she is having at present time.       Assessment & Plan:  I think it would be unusual to have a recurrence of herpes zoster at this point. Am beginning to wonder if this is herpes simplex instead. Going to place her back on Valtrex 1 g 3 times daily for 7 days and then place her on suppression therapy with Valtrex 500 mg daily for 6 months. It does sound like she has had  a postherpetic neuralgia with the symptoms of burning after healing from the initial outbreak. We discussed possibly putting her on gabapentin  if the symptoms persist. For anxiety gave her Xanax 0.5 mg (#60) one half to one by mouth twice daily with no refill.

## 2011-12-20 NOTE — Patient Instructions (Addendum)
Take Valtrex 1 g 3 times daily for 7 days then 500 mg daily for 6 months. Take Xanax as needed for anxiety.

## 2012-01-25 ENCOUNTER — Other Ambulatory Visit: Payer: 59 | Admitting: Internal Medicine

## 2012-01-25 DIAGNOSIS — E785 Hyperlipidemia, unspecified: Secondary | ICD-10-CM

## 2012-01-25 DIAGNOSIS — E039 Hypothyroidism, unspecified: Secondary | ICD-10-CM

## 2012-01-25 DIAGNOSIS — Z0000 Encounter for general adult medical examination without abnormal findings: Secondary | ICD-10-CM

## 2012-01-25 LAB — COMPREHENSIVE METABOLIC PANEL
Albumin: 3.8 g/dL (ref 3.5–5.2)
Alkaline Phosphatase: 82 U/L (ref 39–117)
BUN: 12 mg/dL (ref 6–23)
Glucose, Bld: 85 mg/dL (ref 70–99)
Potassium: 3.9 mEq/L (ref 3.5–5.3)

## 2012-01-25 LAB — CBC WITH DIFFERENTIAL/PLATELET
Basophils Relative: 1 % (ref 0–1)
Eosinophils Absolute: 0.1 10*3/uL (ref 0.0–0.7)
Eosinophils Relative: 1 % (ref 0–5)
HCT: 41.4 % (ref 36.0–46.0)
Hemoglobin: 13.9 g/dL (ref 12.0–15.0)
MCH: 29.6 pg (ref 26.0–34.0)
MCHC: 33.6 g/dL (ref 30.0–36.0)
MCV: 88.3 fL (ref 78.0–100.0)
Monocytes Absolute: 0.6 10*3/uL (ref 0.1–1.0)
Monocytes Relative: 7 % (ref 3–12)
Neutro Abs: 5.5 10*3/uL (ref 1.7–7.7)

## 2012-01-25 LAB — LIPID PANEL
HDL: 51 mg/dL (ref >39)
LDL Cholesterol: 91 mg/dL (ref 0–99)
Triglycerides: 188 mg/dL — ABNORMAL HIGH (ref <150)

## 2012-01-27 ENCOUNTER — Other Ambulatory Visit: Payer: 59 | Admitting: Internal Medicine

## 2012-01-28 ENCOUNTER — Encounter: Payer: 59 | Admitting: Internal Medicine

## 2012-02-09 ENCOUNTER — Telehealth: Payer: Self-pay | Admitting: Internal Medicine

## 2012-02-09 DIAGNOSIS — Z0000 Encounter for general adult medical examination without abnormal findings: Secondary | ICD-10-CM

## 2012-02-09 NOTE — Telephone Encounter (Signed)
Patient is currently on Lo Loestrin oral contraceptives. She will to change to a generic formulation. Pharmacy is recommending Loestrin. New prescription written for Loestrin 1.5  FE 3 months supply with when necessary 1 year refill. Fax to [facility].

## 2012-02-10 ENCOUNTER — Other Ambulatory Visit: Payer: Self-pay

## 2012-02-10 MED ORDER — NORETHIN ACE-ETH ESTRAD-FE 1.5-30 MG-MCG PO TABS: 1.0000 | ORAL_TABLET | Freq: Every day | ORAL | Status: DC

## 2012-03-07 ENCOUNTER — Ambulatory Visit (INDEPENDENT_AMBULATORY_CARE_PROVIDER_SITE_OTHER): Payer: 59 | Admitting: Internal Medicine

## 2012-03-07 ENCOUNTER — Encounter: Payer: Self-pay | Admitting: Internal Medicine

## 2012-03-07 ENCOUNTER — Other Ambulatory Visit: Payer: Self-pay | Admitting: Internal Medicine

## 2012-03-07 VITALS — BP 122/88 | Temp 99.1°F | Wt 262.0 lb

## 2012-03-07 DIAGNOSIS — R768 Other specified abnormal immunological findings in serum: Secondary | ICD-10-CM

## 2012-03-07 DIAGNOSIS — F411 Generalized anxiety disorder: Secondary | ICD-10-CM

## 2012-03-07 DIAGNOSIS — R894 Abnormal immunological findings in specimens from other organs, systems and tissues: Secondary | ICD-10-CM

## 2012-03-07 NOTE — Progress Notes (Signed)
  Subjective:    Patient ID: Dana Washington, female    DOB: 05-31-1975, 37 y.o.   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 Last night she received a letter in the mail from the [employer] telling her she tested positive for T. Cruzi and had a positive hepatitis C antibody. Patient denies ever having had blood transfusions. Says in 1996 she spent 2 weeks in [place]. Says she has not had sex in 18 years. Denies any needle sticks. All of this has upset the patient. She is tearful in the office today. Says that she previously donated blood [place] lived out of [place] through a hospital blood bank and was never told that she had any type of abnormalities with blood testing. This is the first time she has ever donated through the [employer].  Of the tests performed,   T. crouzi final interpretation is negative, T. cruzi  supplemental test is nonreactive, T. cruzi confirmatory test is negative. T. cruzi antibodies are positive. Hepatitis C antibody reactive. Hepatitis C supplemental test negative.     Review of Systems     Objective:   Physical Exam  Patient was not examined today. Spent 25 minutes explaining lab result issues to patient. She's been under a lot of stress with illnesses in her dogs. She's crying in the office today. Explained to her the [employer] would not allow her to donate blood again with these findings due to their strict blood donation policy.She is disappointed in this.        Assessment & Plan:  Positive hepatitis C antibody-new information to patient with no history of needle stick or transfusion  T. Cruzi antibodies with no evidence of active disease  Anxiety  Plan: These tests will be repeated at [facility] lab. She is scheduled next week for physical exam and we will discuss at that time. Liver functions drawn.

## 2012-03-07 NOTE — Patient Instructions (Addendum)
Return on Monday for physical examination. Lab work has been drawn today in followup of [employer] results

## 2012-03-13 ENCOUNTER — Other Ambulatory Visit: Payer: Self-pay | Admitting: Internal Medicine

## 2012-03-13 ENCOUNTER — Ambulatory Visit (INDEPENDENT_AMBULATORY_CARE_PROVIDER_SITE_OTHER): Payer: 59 | Admitting: Internal Medicine

## 2012-03-13 VITALS — BP 116/76 | HR 88 | Temp 98.6°F | Ht 65.0 in | Wt 254.0 lb

## 2012-03-13 DIAGNOSIS — E8881 Metabolic syndrome: Secondary | ICD-10-CM

## 2012-03-13 DIAGNOSIS — E785 Hyperlipidemia, unspecified: Secondary | ICD-10-CM

## 2012-03-13 DIAGNOSIS — F32A Depression, unspecified: Secondary | ICD-10-CM

## 2012-03-13 DIAGNOSIS — I10 Essential (primary) hypertension: Secondary | ICD-10-CM

## 2012-03-13 DIAGNOSIS — Z0000 Encounter for general adult medical examination without abnormal findings: Secondary | ICD-10-CM

## 2012-03-13 DIAGNOSIS — R7301 Impaired fasting glucose: Secondary | ICD-10-CM

## 2012-03-13 DIAGNOSIS — E669 Obesity, unspecified: Secondary | ICD-10-CM

## 2012-03-13 DIAGNOSIS — F3289 Other specified depressive episodes: Secondary | ICD-10-CM

## 2012-03-13 DIAGNOSIS — E039 Hypothyroidism, unspecified: Secondary | ICD-10-CM

## 2012-03-13 LAB — POCT URINALYSIS DIPSTICK
Bilirubin, UA: NEGATIVE
Ketones, UA: NEGATIVE
Leukocytes, UA: NEGATIVE
Nitrite, UA: NEGATIVE
Protein, UA: NEGATIVE
pH, UA: 5.5

## 2012-03-13 LAB — HEMOGLOBIN A1C: Hgb A1c MFr Bld: 5.4 % (ref <5.7)

## 2012-03-14 ENCOUNTER — Encounter: Payer: Self-pay | Admitting: Internal Medicine

## 2012-03-14 NOTE — Patient Instructions (Addendum)
We will make appointment for you to be seen in infectious disease clinic. Return in 6 months for office visit, lipid panel, liver functions, hemoglobin A1c and TSH. Encouraged diet and exercise.

## 2012-03-14 NOTE — Progress Notes (Signed)
Subjective:    Patient ID: Dana Washington, female    DOB: 17-Aug-1975, 37 y.o.   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 She had recent office visit saying that [employer] had written her a letter saying she had tested positive for hepatitis C and for Chagas  disease. Patient has visited [place] wants but stayed and a very nice hotel in [place] for couple of weeks. No travel to [place] or [place].  She was first seen here in December 2012 having moved from [place]. 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 She has a sister who lives here. She was tried on Pravachol for while in [place] but subsequently discontinued it. Apparently this is inherited from her father. History of depression treated with Cymbalta. Says moving has been stressful, her dog died, and now she's been told she can no longer donate blood. History of hypothyroidism treated with Synthroid 0.1 mg daily.  She had breast reduction surgery may 2010. Subsequently had bilateral CTR February 2012. Lasix surgery 2008. History of Hashimoto's thyroiditis with resultant hypothyroidism. History of vitamin D deficiency. Benign lymph node biopsy February 2010.  Family history: Patient says there is a family history of early onset breast cancer. Father with history of prostate cancer. Mother with history of skin cancer. Sister with history of migraine headaches.  Social history patient is single with no children.    Review of Systems  Constitutional: Positive for fatigue.  HENT: Negative.   Eyes:       History of Lasix surgery for myopia  Respiratory: Negative.   Cardiovascular: Negative.   Endocrine:       History of hypothyroidism  Genitourinary: Negative.   Neurological:   History of migraine headaches  Hematological: Negative.   Psychiatric/Behavioral:       Anxiety and depression       Objective:   Physical Exam  Constitutional: She is oriented to person, place, and time. She appears well-developed and well-nourished. No distress.  HENT:  Head: Normocephalic and atraumatic.  Right Ear: External ear normal.  Left Ear: External ear normal.  Mouth/Throat: No oropharyngeal exudate.  Eyes: Conjunctivae and EOM are normal. Pupils are equal, round, and reactive to light. Right eye exhibits no discharge. Left eye exhibits no discharge. No scleral icterus.  Neck: Neck supple. No thyromegaly present.  Cardiovascular: Normal rate, regular rhythm, normal heart sounds and intact distal pulses.   No murmur heard. Pulmonary/Chest: Effort normal. No respiratory distress. She has no wheezes. She has no rales. She exhibits no tenderness.  Breasts normal female. She does have a nodule adjacent to the left nipple at 3:00 which is been there for some time. She says it has been previously identified as a SEROMA  Abdominal: Soft. Bowel sounds are normal. She exhibits no distension and no mass. There is no tenderness. There is no rebound and no guarding.  Genitourinary:  Deferred to GYN  Musculoskeletal: She exhibits no edema.  Lymphadenopathy:    She has no cervical adenopathy.  Neurological: She is alert and oriented to person, place, and time. She has normal reflexes. No cranial nerve deficit. Coordination normal.  Skin: Skin is warm and dry. No rash noted. She is not diaphoretic.  Psychiatric: She has a normal mood and affect. Her behavior is normal. Judgment and thought content normal.  Assessment & Plan:   Seroma  left breast at 3:00  Hypothyroidism  Hyperlipidemia  Obesity  Impaired glucose tolerance  History of migraine headaches  Depression  Hypertension  Plan: Refer patient to infectious disease clinic to clear up questions she has  about hepatitis C status and Chagas disease. We did recheck hepatitis C antibody recently through [facility] lab and the report is negative. Repeat Chagas titer is pending.  Other medical problems are stable at this point in time on current medications. No change in current medications. Return in 6 months.

## 2012-03-16 ENCOUNTER — Telehealth: Payer: Self-pay

## 2012-03-16 DIAGNOSIS — Z202 Contact with and (suspected) exposure to infections with a predominantly sexual mode of transmission: Secondary | ICD-10-CM

## 2012-03-24 NOTE — Telephone Encounter (Signed)
Scheduled for appointment with Infec disease md

## 2012-03-27 ENCOUNTER — Ambulatory Visit (INDEPENDENT_AMBULATORY_CARE_PROVIDER_SITE_OTHER): Payer: 59 | Admitting: Internal Medicine

## 2012-03-27 ENCOUNTER — Encounter: Payer: Self-pay | Admitting: Internal Medicine

## 2012-03-27 VITALS — BP 135/88 | HR 114 | Temp 98.3°F | Ht 66.0 in | Wt 230.0 lb

## 2012-03-27 DIAGNOSIS — R899 Unspecified abnormal finding in specimens from other organs, systems and tissues: Secondary | ICD-10-CM

## 2012-03-27 DIAGNOSIS — R6889 Other general symptoms and signs: Secondary | ICD-10-CM

## 2012-03-27 NOTE — Progress Notes (Signed)
Patient ID: Dana Washington, female   DOB: 21-Apr-1975, 37 y.o.   MRN: [id number]         [facility]  Reason for Consult: Positive screening antibody test for hepatitis C and Trypanosoma cruzi infection by the [employer] Referring Physician: Dr. Eden Emms Baxley  Patient Active Problem List  Diagnosis  . Hyperlipidemia  . Impaired glucose tolerance  . Obesity  . Hypothyroidism  . History of migraine headaches  . Depression  . Hypertension    Patient's Medications  New Prescriptions   No medications on file  Previous Medications   ATORVASTATIN (LIPITOR) 10 MG TABLET    TAKE 1 TABLET BY MOUTH ONCE DAILY   BENAZEPRIL-HYDROCHLORTHIAZIDE (LOTENSIN HCT) 20-12.5 MG PER TABLET    TAKE 1 TABLET BY MOUTH ONCE DAILY   BUTALBITAL-ACETAMINOPHEN-CAFFEINE (FIORICET, ESGIC) 50-325-40 MG PER TABLET    Take 1 tablet by mouth 2 (two) times daily as needed for headache.   DULOXETINE (CYMBALTA) 60 MG CAPSULE    Take 1 capsule (60 mg total) by mouth daily.   FAMOTIDINE (PEPCID) 40 MG TABLET    Take 1 tablet (40 mg total) by mouth 2 (two) times daily as needed for heartburn.   FROVATRIPTAN (FROVA) 2.5 MG TABLET    Take 1 tablet (2.5 mg total) by mouth as needed for migraine. If recurs, may repeat after 2 hours. Max of 3 tabs in 24 hours.   LEVOTHYROXINE (SYNTHROID, LEVOTHROID) 100 MCG TABLET    Take 100 mcg by mouth daily.     NORETHINDRONE-ETHINYL ESTRADIOL-IRON (LOESTRIN FE 1.5/30) 1.5-30 MG-MCG TABLET    Take 1 tablet by mouth daily.   TOPIRAMATE (TOPAMAX) 100 MG TABLET    TAKE 1 TABLET BY MOUTH AT BEDTIME   VITAMIN E (VITAMIN E) 400 UNIT CAPSULE    Take 400 Units by mouth daily.    Modified Medications   Modified Medication Previous Medication   VALACYCLOVIR (VALTREX) 1000 MG TABLET valACYclovir (VALTREX) 1000 MG tablet          TAKE 1 TABLET BY MOUTH THREE TIMES A DAY FOR 7 DAYS  Discontinued Medications   No medications on file    Recommendations: 1. No further  diagnostic testing or treatment for hepatitis C or Trypanosoma infection needed   Assessment: She is at very low risk for hepatitis C and Chagas disease. I reviewed his serologic test [facility] both cases screening tests were positive but confirmatory test were negative indicating that both results were false positives. She also had further testing for both hepatitis C and Chagas ordered by Dr. Lenord Fellers and both of those were negative as well. She seems reassured by this. There is no need for any further diagnostic testing. She has no evidence of infection and does not need any treatment.   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 Lenord Fellers for evaluation of positive screening antibody test at the time of blood donation one month ago. Dana Washington has been donating blood on a regular basis for the past 15 years. She recently moved here to [place] from [place]. The health system she worked with in [place] and its own blood donation systems so she had not been donating through the [employer]. Last month she gave her first donation after moving to [facility] and received a letter from the [employer] stating that she had positive antibody test for hepatitis C and Trypanosoma. She denies any  risk factors for hepatitis C and had never been notified of any abnormal test by her blood bank in [place]. She had one brief trip to [place] 18 years ago, staying in nice hotel accommodations. She's had no other travel to central or [place].  Review of Systems: Pertinent items are noted in HPI.    Past Medical History  Diagnosis Date  . Hypertension   . Thyroid disease   . Vitamin D deficiency   . Hyperlipidemia     History  Substance Use Topics  . Smoking status: Never Smoker   . Smokeless tobacco: Never Used  . Alcohol Use: Yes     Comment: social    Family History  Problem Relation Age of Onset  . Heart disease Maternal  Grandfather   . Heart disease Paternal Grandmother    Allergies  Allergen Reactions  . Bee Venom Hives    OBJECTIVE: Blood pressure 135/88, pulse 114, temperature 98.3 F (36.8 C), temperature source Oral, height 5\' 6"  (1.676 m), weight 230 lb (104.327 kg), last menstrual period 02/29/2012. General: she is nervous but otherwise good spirits  Microbiology: No results found for this or any previous visit (from the past 240 hour(s)).  Cliffton Asters, MD [facility] [phone] pager   [phone] cell 03/27/2012, 3:37 PM

## 2012-04-25 ENCOUNTER — Other Ambulatory Visit: Payer: Self-pay | Admitting: Internal Medicine

## 2012-05-16 ENCOUNTER — Other Ambulatory Visit: Payer: Self-pay

## 2012-05-16 ENCOUNTER — Encounter: Payer: Self-pay | Admitting: Internal Medicine

## 2012-05-16 MED ORDER — POLYETHYLENE GLYCOL 3350 17 GM/SCOOP PO POWD: 17.0000 g | Freq: Every day | ORAL | Status: DC | PRN

## 2012-06-02 ENCOUNTER — Other Ambulatory Visit (HOSPITAL_COMMUNITY): Payer: Self-pay | Admitting: Sports Medicine

## 2012-06-02 DIAGNOSIS — M25511 Pain in right shoulder: Secondary | ICD-10-CM

## 2012-06-09 ENCOUNTER — Ambulatory Visit (HOSPITAL_COMMUNITY)
Admission: RE | Admit: 2012-06-09 | Discharge: 2012-06-09 | Disposition: A | Discharge status: Home / Self Care | Payer: 59 | Source: Ambulatory Visit | Attending: Sports Medicine | Admitting: Sports Medicine

## 2012-06-09 DIAGNOSIS — M19019 Primary osteoarthritis, unspecified shoulder: Secondary | ICD-10-CM | POA: Insufficient documentation

## 2012-06-09 DIAGNOSIS — M25511 Pain in right shoulder: Secondary | ICD-10-CM

## 2012-06-15 ENCOUNTER — Other Ambulatory Visit: Payer: Self-pay | Admitting: Internal Medicine

## 2012-06-15 ENCOUNTER — Ambulatory Visit (HOSPITAL_COMMUNITY): Payer: 59

## 2012-06-19 ENCOUNTER — Ambulatory Visit: Payer: 59 | Attending: Internal Medicine | Admitting: Physical Therapy

## 2012-06-19 DIAGNOSIS — IMO0001 Reserved for inherently not codable concepts without codable children: Secondary | ICD-10-CM | POA: Insufficient documentation

## 2012-06-19 DIAGNOSIS — M25519 Pain in unspecified shoulder: Secondary | ICD-10-CM | POA: Insufficient documentation

## 2012-06-22 ENCOUNTER — Ambulatory Visit: Payer: 59 | Admitting: Rehabilitative and Restorative Service Providers"

## 2012-06-22 ENCOUNTER — Ambulatory Visit: Payer: 59

## 2012-06-30 ENCOUNTER — Ambulatory Visit: Payer: 59 | Admitting: Physical Therapy

## 2012-07-03 ENCOUNTER — Ambulatory Visit: Payer: 59 | Admitting: Physical Therapy

## 2012-07-05 ENCOUNTER — Ambulatory Visit: Payer: 59 | Admitting: Physical Therapy

## 2012-07-07 ENCOUNTER — Ambulatory Visit: Payer: 59 | Admitting: Physical Therapy

## 2012-07-13 ENCOUNTER — Ambulatory Visit: Payer: 59 | Attending: Internal Medicine | Admitting: Physical Therapy

## 2012-07-13 DIAGNOSIS — IMO0001 Reserved for inherently not codable concepts without codable children: Secondary | ICD-10-CM | POA: Insufficient documentation

## 2012-07-13 DIAGNOSIS — M25519 Pain in unspecified shoulder: Secondary | ICD-10-CM | POA: Insufficient documentation

## 2012-07-17 ENCOUNTER — Encounter: Payer: 59 | Admitting: Physical Therapy

## 2012-07-20 ENCOUNTER — Ambulatory Visit: Payer: 59 | Admitting: Physical Therapy

## 2012-07-24 ENCOUNTER — Encounter: Payer: 59 | Admitting: Physical Therapy

## 2012-07-27 ENCOUNTER — Encounter: Payer: 59 | Admitting: Physical Therapy

## 2012-08-21 ENCOUNTER — Other Ambulatory Visit: Payer: Self-pay | Admitting: Orthopedic Surgery

## 2012-08-28 ENCOUNTER — Other Ambulatory Visit: Payer: Self-pay | Admitting: Internal Medicine

## 2012-09-07 ENCOUNTER — Other Ambulatory Visit: Payer: Self-pay

## 2012-09-07 ENCOUNTER — Encounter: Payer: Self-pay | Admitting: Internal Medicine

## 2012-09-07 MED ORDER — OMEPRAZOLE 20 MG PO CPDR: 20.0000 mg | DELAYED_RELEASE_CAPSULE | Freq: Every day | ORAL | Status: DC

## 2012-09-08 ENCOUNTER — Encounter (HOSPITAL_BASED_OUTPATIENT_CLINIC_OR_DEPARTMENT_OTHER): Payer: Self-pay | Admitting: *Deleted

## 2012-09-08 NOTE — Progress Notes (Signed)
NPO AFTER MN WITH EXCEPTION CLEAR LIQUIDS UNTIL 0800 (NO CREAM/ MILK PRODUCTS).  ARRIVE AT 1200. NEEDS ISTAT, URINE PREG. AND EKG. WILL TAKE AM MEDS EXCEPT NO LOTENSIN W/ SIPS OF WATER.

## 2012-09-19 ENCOUNTER — Encounter (HOSPITAL_BASED_OUTPATIENT_CLINIC_OR_DEPARTMENT_OTHER): Payer: Self-pay | Admitting: *Deleted

## 2012-09-19 ENCOUNTER — Ambulatory Visit (HOSPITAL_BASED_OUTPATIENT_CLINIC_OR_DEPARTMENT_OTHER): Payer: 59 | Admitting: Anesthesiology

## 2012-09-19 ENCOUNTER — Encounter (HOSPITAL_BASED_OUTPATIENT_CLINIC_OR_DEPARTMENT_OTHER)
Admission: RE | Disposition: A | Discharge status: Home / Self Care | Payer: Self-pay | Source: Ambulatory Visit | Attending: Specialist

## 2012-09-19 ENCOUNTER — Ambulatory Visit (HOSPITAL_BASED_OUTPATIENT_CLINIC_OR_DEPARTMENT_OTHER)
Admission: RE | Admit: 2012-09-19 | Discharge: 2012-09-19 | Disposition: A | Discharge status: Home / Self Care | Payer: 59 | Source: Ambulatory Visit | Attending: Specialist | Admitting: Specialist

## 2012-09-19 ENCOUNTER — Encounter (HOSPITAL_BASED_OUTPATIENT_CLINIC_OR_DEPARTMENT_OTHER): Payer: Self-pay | Admitting: Anesthesiology

## 2012-09-19 DIAGNOSIS — E669 Obesity, unspecified: Secondary | ICD-10-CM

## 2012-09-19 DIAGNOSIS — IMO0002 Reserved for concepts with insufficient information to code with codable children: Secondary | ICD-10-CM | POA: Insufficient documentation

## 2012-09-19 DIAGNOSIS — K219 Gastro-esophageal reflux disease without esophagitis: Secondary | ICD-10-CM | POA: Insufficient documentation

## 2012-09-19 DIAGNOSIS — E038 Other specified hypothyroidism: Secondary | ICD-10-CM | POA: Insufficient documentation

## 2012-09-19 DIAGNOSIS — M75100 Unspecified rotator cuff tear or rupture of unspecified shoulder, not specified as traumatic: Secondary | ICD-10-CM | POA: Insufficient documentation

## 2012-09-19 DIAGNOSIS — I10 Essential (primary) hypertension: Secondary | ICD-10-CM | POA: Insufficient documentation

## 2012-09-19 DIAGNOSIS — M24119 Other articular cartilage disorders, unspecified shoulder: Secondary | ICD-10-CM | POA: Insufficient documentation

## 2012-09-19 DIAGNOSIS — S43429A Sprain of unspecified rotator cuff capsule, initial encounter: Secondary | ICD-10-CM | POA: Insufficient documentation

## 2012-09-19 DIAGNOSIS — E785 Hyperlipidemia, unspecified: Secondary | ICD-10-CM | POA: Insufficient documentation

## 2012-09-19 DIAGNOSIS — M19019 Primary osteoarthritis, unspecified shoulder: Secondary | ICD-10-CM | POA: Insufficient documentation

## 2012-09-19 DIAGNOSIS — Z79899 Other long term (current) drug therapy: Secondary | ICD-10-CM | POA: Insufficient documentation

## 2012-09-19 DIAGNOSIS — E559 Vitamin D deficiency, unspecified: Secondary | ICD-10-CM | POA: Insufficient documentation

## 2012-09-19 DIAGNOSIS — M25819 Other specified joint disorders, unspecified shoulder: Secondary | ICD-10-CM | POA: Insufficient documentation

## 2012-09-19 DIAGNOSIS — X58XXXA Exposure to other specified factors, initial encounter: Secondary | ICD-10-CM | POA: Insufficient documentation

## 2012-09-19 HISTORY — DX: Impingement syndrome of right shoulder: M75.41

## 2012-09-19 HISTORY — DX: Gastro-esophageal reflux disease without esophagitis: K21.9

## 2012-09-19 HISTORY — DX: Migraine, unspecified, not intractable, without status migrainosus: G43.909

## 2012-09-19 HISTORY — DX: Unspecified osteoarthritis, unspecified site: M19.90

## 2012-09-19 HISTORY — DX: Personal history of other endocrine, nutritional and metabolic disease: Z86.39

## 2012-09-19 HISTORY — DX: Other specified hypothyroidism: E03.8

## 2012-09-19 HISTORY — PX: SHOULDER ARTHROSCOPY WITH ROTATOR CUFF REPAIR AND SUBACROMIAL DECOMPRESSION: SHX5686

## 2012-09-19 HISTORY — DX: Other specified postprocedural states: R11.2

## 2012-09-19 HISTORY — DX: Other specified postprocedural states: Z98.890

## 2012-09-19 HISTORY — DX: Impaired glucose tolerance (oral): R73.02

## 2012-09-19 LAB — POCT I-STAT 4, (NA,K, GLUC, HGB,HCT)
Glucose, Bld: 97 mg/dL (ref 70–99)
HCT: 42 % (ref 36.0–46.0)
Hemoglobin: 14.3 g/dL (ref 12.0–15.0)

## 2012-09-19 LAB — POCT PREGNANCY, URINE: Preg Test, Ur: NEGATIVE

## 2012-09-19 SURGERY — SHOULDER ARTHROSCOPY WITH ROTATOR CUFF REPAIR AND SUBACROMIAL DECOMPRESSION
Anesthesia: General | Site: Shoulder | Laterality: Right | Wound class: Clean

## 2012-09-19 MED ORDER — SODIUM CHLORIDE 0.9 % IR SOLN
Status: DC | PRN
  Administered 2012-09-19: 24000 mL

## 2012-09-19 MED ORDER — METOCLOPRAMIDE HCL 5 MG/ML IJ SOLN
INTRAMUSCULAR | Status: DC | PRN
  Administered 2012-09-19: 10 mg via INTRAVENOUS

## 2012-09-19 MED ORDER — GLYCOPYRROLATE 0.2 MG/ML IJ SOLN
INTRAMUSCULAR | Status: DC | PRN
  Administered 2012-09-19: 0.4 mg via INTRAVENOUS

## 2012-09-19 MED ORDER — OXYCODONE-ACETAMINOPHEN 5-325 MG PO TABS: 1.0000 | ORAL_TABLET | ORAL | Status: DC | PRN

## 2012-09-19 MED ORDER — ROPIVACAINE HCL 5 MG/ML IJ SOLN
INTRAMUSCULAR | Status: DC | PRN
  Administered 2012-09-19: 30 mL

## 2012-09-19 MED ORDER — FENTANYL CITRATE 0.05 MG/ML IJ SOLN
25.0000 ug | INTRAMUSCULAR | Status: DC | PRN
  Administered 2012-09-19: 25 ug via INTRAVENOUS
  Filled 2012-09-19: qty 1

## 2012-09-19 MED ORDER — CEPHALEXIN 500 MG PO CAPS: 500.0000 mg | ORAL_CAPSULE | Freq: Three times a day (TID) | ORAL | Status: DC

## 2012-09-19 MED ORDER — CEFAZOLIN SODIUM-DEXTROSE 2-3 GM-% IV SOLR
2.0000 g | INTRAVENOUS | Status: AC
  Administered 2012-09-19: 2 g via INTRAVENOUS
  Filled 2012-09-19: qty 50

## 2012-09-19 MED ORDER — MIDAZOLAM HCL 5 MG/5ML IJ SOLN
INTRAMUSCULAR | Status: DC | PRN
  Administered 2012-09-19: 2 mg via INTRAVENOUS

## 2012-09-19 MED ORDER — PROPOFOL 10 MG/ML IV BOLUS
INTRAVENOUS | Status: DC | PRN
  Administered 2012-09-19: 150 mg via INTRAVENOUS

## 2012-09-19 MED ORDER — CHLORHEXIDINE GLUCONATE 4 % EX LIQD
60.0000 mL | Freq: Once | CUTANEOUS | Status: DC
  Filled 2012-09-19: qty 60

## 2012-09-19 MED ORDER — SCOPOLAMINE 1 MG/3DAYS TD PT72
1.0000 | MEDICATED_PATCH | TRANSDERMAL | Status: DC
  Administered 2012-09-19: 1.5 mg via TRANSDERMAL
  Filled 2012-09-19: qty 1

## 2012-09-19 MED ORDER — SODIUM CHLORIDE 0.9 % IV SOLN
INTRAVENOUS | Status: DC
  Filled 2012-09-19: qty 1000

## 2012-09-19 MED ORDER — LACTATED RINGERS IV SOLN
INTRAVENOUS | Status: DC
  Filled 2012-09-19: qty 1000

## 2012-09-19 MED ORDER — SUCCINYLCHOLINE CHLORIDE 20 MG/ML IJ SOLN
INTRAMUSCULAR | Status: DC | PRN
  Administered 2012-09-19: 120 mg via INTRAVENOUS

## 2012-09-19 MED ORDER — FENTANYL CITRATE 0.05 MG/ML IJ SOLN
INTRAMUSCULAR | Status: DC | PRN
  Administered 2012-09-19: 50 ug via INTRAVENOUS
  Administered 2012-09-19: 25 ug via INTRAVENOUS
  Administered 2012-09-19: 50 ug via INTRAVENOUS
  Administered 2012-09-19: 25 ug via INTRAVENOUS
  Administered 2012-09-19: 50 ug via INTRAVENOUS

## 2012-09-19 MED ORDER — METHOCARBAMOL 500 MG PO TABS: 500.0000 mg | ORAL_TABLET | Freq: Four times a day (QID) | ORAL | Status: DC | PRN

## 2012-09-19 MED ORDER — NEOSTIGMINE METHYLSULFATE 1 MG/ML IJ SOLN
INTRAMUSCULAR | Status: DC | PRN
  Administered 2012-09-19: 3 mg via INTRAVENOUS

## 2012-09-19 MED ORDER — LACTATED RINGERS IV SOLN
INTRAVENOUS | Status: DC
  Administered 2012-09-19 (×3): via INTRAVENOUS
  Filled 2012-09-19: qty 1000

## 2012-09-19 MED ORDER — DEXAMETHASONE SODIUM PHOSPHATE 10 MG/ML IJ SOLN
INTRAMUSCULAR | Status: DC | PRN
  Administered 2012-09-19: 10 mg via INTRAVENOUS

## 2012-09-19 MED ORDER — LIDOCAINE HCL (CARDIAC) 20 MG/ML IV SOLN
INTRAVENOUS | Status: DC | PRN
  Administered 2012-09-19: 100 mg via INTRAVENOUS

## 2012-09-19 MED ORDER — ROCURONIUM BROMIDE 100 MG/10ML IV SOLN
INTRAVENOUS | Status: DC | PRN
  Administered 2012-09-19: 30 mg via INTRAVENOUS

## 2012-09-19 SURGICAL SUPPLY — 75 items
BLADE CUDA GRT WHITE 3.5 (BLADE) ×2 IMPLANT
BLADE CUTTER GATOR 3.5 (BLADE) IMPLANT
BLADE GREAT WHITE 4.2 (BLADE) ×2 IMPLANT
BLADE SURG 11 STRL SS (BLADE) ×2 IMPLANT
BLADE SURG 15 STRL LF DISP TIS (BLADE) IMPLANT
BLADE SURG 15 STRL SS (BLADE)
BUR 3.5 LG SPHERICAL (BURR) IMPLANT
BUR OVAL 6.0 (BURR) ×2 IMPLANT
BURR 3.5 LG SPHERICAL (BURR)
CANISTER SUCT LVC 12 LTR MEDI- (MISCELLANEOUS) ×6 IMPLANT
CANISTER SUCTION 2500CC (MISCELLANEOUS) IMPLANT
CANNULA 5.75X7 CRYSTAL CLEAR (CANNULA) ×2 IMPLANT
CANNULA 5.75X71 LONG (CANNULA) IMPLANT
CANNULA TWIST IN 8.25X7CM (CANNULA) ×4 IMPLANT
CLOTH BEACON ORANGE TIMEOUT ST (SAFETY) ×2 IMPLANT
COVER MAYO STAND STRL (DRAPES) ×2 IMPLANT
COVER TABLE BACK 60X90 (DRAPES) ×2 IMPLANT
DRAPE LG THREE QUARTER DISP (DRAPES) ×4 IMPLANT
DRAPE ORTHO SPLIT 77X108 STRL (DRAPES) ×2
DRAPE POUCH INSTRU U-SHP 10X18 (DRAPES) ×2 IMPLANT
DRAPE STERI 35X30 U-POUCH (DRAPES) ×2 IMPLANT
DRAPE SURG 17X23 STRL (DRAPES) ×2 IMPLANT
DRAPE SURG ORHT 6 SPLT 77X108 (DRAPES) ×2 IMPLANT
DRAPE U-SHAPE 47X51 STRL (DRAPES) ×2 IMPLANT
DRSG PAD ABDOMINAL 8X10 ST (GAUZE/BANDAGES/DRESSINGS) ×2 IMPLANT
DURAPREP 26ML APPLICATOR (WOUND CARE) ×2 IMPLANT
ELECT MENISCUS 165MM 90D (ELECTRODE) IMPLANT
ELECT REM PT RETURN 9FT ADLT (ELECTROSURGICAL) ×2
ELECTRODE REM PT RTRN 9FT ADLT (ELECTROSURGICAL) ×1 IMPLANT
FIBERSTICK 2 (SUTURE) IMPLANT
GAUZE SPONGE 4X4 12PLY STRL LF (GAUZE/BANDAGES/DRESSINGS) ×2 IMPLANT
GAUZE XEROFORM 1X8 LF (GAUZE/BANDAGES/DRESSINGS) ×2 IMPLANT
GLOVE BIO SURGEON STRL SZ7.5 (GLOVE) ×2 IMPLANT
GLOVE BIOGEL M 6.5 STRL (GLOVE) ×2 IMPLANT
GLOVE BIOGEL M STER SZ 6 (GLOVE) ×2 IMPLANT
GLOVE BIOGEL PI IND STRL 7.5 (GLOVE) ×2 IMPLANT
GLOVE BIOGEL PI INDICATOR 7.5 (GLOVE) ×2
GLOVE INDICATOR 8.0 STRL GRN (GLOVE) ×4 IMPLANT
GLOVE SURG ORTHO 8.0 STRL STRW (GLOVE) ×2 IMPLANT
GOWN PREVENTION PLUS LG XLONG (DISPOSABLE) ×4 IMPLANT
GOWN STRL REIN XL XLG (GOWN DISPOSABLE) ×4 IMPLANT
KIT SHOULDER TRACTION (DRAPES) ×2 IMPLANT
LASSO SUT 90 DEGREE (SUTURE) IMPLANT
NEEDLE 1/2 CIR CATGUT .05X1.09 (NEEDLE) IMPLANT
NEEDLE HYPO 22GX1.5 SAFETY (NEEDLE) ×2 IMPLANT
NEEDLE SCORPION (NEEDLE) IMPLANT
NEEDLE SPNL 18GX3.5 QUINCKE PK (NEEDLE) ×2 IMPLANT
NS IRRIG 500ML POUR BTL (IV SOLUTION) IMPLANT
PACK BASIN DAY SURGERY FS (CUSTOM PROCEDURE TRAY) ×2 IMPLANT
PENCIL BUTTON HOLSTER BLD 10FT (ELECTRODE) IMPLANT
SET ARTHROSCOPY TUBING (MISCELLANEOUS) ×1
SET ARTHROSCOPY TUBING PVC (MISCELLANEOUS) ×1 IMPLANT
SLING ULTRA II AB L (ORTHOPEDIC SUPPLIES) IMPLANT
SLING ULTRA II AB S (ORTHOPEDIC SUPPLIES) IMPLANT
SLING ULTRA II LARGE (SOFTGOODS) ×2 IMPLANT
SPONGE GAUZE 4X4 12PLY (GAUZE/BANDAGES/DRESSINGS) ×2 IMPLANT
SPONGE LAP 4X18 X RAY DECT (DISPOSABLE) IMPLANT
SUCTION FRAZIER TIP 10 FR DISP (SUCTIONS) IMPLANT
SUT ETHILON 2 0 PS N (SUTURE) ×2 IMPLANT
SUT LASSO 45 DEGREE LEFT (SUTURE) IMPLANT
SUT LASSO 45D RIGHT (SUTURE) IMPLANT
SUT PDS AB 1 CT1 27 (SUTURE) IMPLANT
SUT VIC AB 0 CT1 36 (SUTURE) IMPLANT
SUT VIC AB 2-0 CT1 27 (SUTURE)
SUT VIC AB 2-0 CT1 TAPERPNT 27 (SUTURE) IMPLANT
SYR 20CC LL (SYRINGE) ×2 IMPLANT
SYR CONTROL 10ML LL (SYRINGE) IMPLANT
SYR TB 1ML 27GX1/2 SAFE (SYRINGE) ×1 IMPLANT
SYR TB 1ML 27GX1/2 SAFETY (SYRINGE) ×1
TAPE HYPAFIX 6X30 (GAUZE/BANDAGES/DRESSINGS) ×2 IMPLANT
TOWEL OR 17X24 6PK STRL BLUE (TOWEL DISPOSABLE) ×2 IMPLANT
TUBE CONNECTING 12X1/4 (SUCTIONS) ×4 IMPLANT
WAND 90 DEG TURBOVAC W/CORD (SURGICAL WAND) ×2 IMPLANT
WATER STERILE IRR 500ML POUR (IV SOLUTION) ×2 IMPLANT
YANKAUER SUCT BULB TIP NO VENT (SUCTIONS) IMPLANT

## 2012-09-19 NOTE — H&P (Signed)
Dana Washington is an 37 y.o. female.   Chief Complaint: Shoulder pain HPI: Patient was admitted to the [facility] surgery center for shoulder arthroscopy as consented. Patient has underwent several conservative treatments, despite them she had a continuation for symptoms. She wishes to proceed with surgical intervention. Denies worsening or changing of symptoms or new injuries.  Past Medical History  Diagnosis Date  . Hypertension   . Vitamin D deficiency   . Hyperlipidemia   . Impingement syndrome of right shoulder   . Hx of Hashimoto thyroiditis   . Hypothyroidism, secondary   . Glucose intolerance (impaired glucose tolerance)   . Migraine   . GERD (gastroesophageal reflux disease)   . OA (osteoarthritis)     RIGHT SHOULDER AC JOINT  . PONV (postoperative nausea and vomiting)     SEVERE    Past Surgical History  Procedure Laterality Date  . Breast reduction surgery Bilateral MAY 2010  . Carpal tunnel release Bilateral 2012  . Biopsy of lymph node  FEB 2010    BENIGN  . Wisdom tooth extraction  AGE 43    Family History  Problem Relation Age of Onset  . Heart disease Maternal Grandfather   . Heart disease Paternal Grandmother    Social History:  reports that she has never smoked. She has never used smokeless tobacco. She reports that  drinks alcohol. She reports that she does not use illicit drugs.  Allergies:  Allergies  Allergen Reactions  . Bee Venom Hives    Medications Prior to Admission  Medication Sig Dispense Refill  . atorvastatin (LIPITOR) 10 MG tablet       . benazepril-hydrochlorthiazide (LOTENSIN HCT) 20-12.5 MG per tablet       . levothyroxine (SYNTHROID) 100 MCG tablet       . norethindrone-ethinyl estradiol-iron (LOESTRIN FE 1.5/30) 1.5-30 MG-MCG tablet Take 1 tablet by mouth daily.  1 Package  11  . omeprazole (PRILOSEC) 20 MG capsule Take 20 mg by mouth every morning.      . polyethylene glycol powder (GLYCOLAX/MIRALAX) powder Take 17 g by mouth daily.       Marland Kitchen PRESCRIPTION MEDICATION Apply topically 3 (three) times daily as needed. ---  COMPOUND PAIN TOPICAL CREAM  --      . topiramate (TOPAMAX) 100 MG tablet       . vitamin E (VITAMIN E) 400 UNIT capsule Take 400 Units by mouth daily.       . butalbital-acetaminophen-caffeine (FIORICET, ESGIC) 50-325-40 MG per tablet Take 1 tablet by mouth 2 (two) times daily as needed for headache.        Results for orders placed during the hospital encounter of 09/19/12 (from the past 48 hour(s))  POCT I-STAT 4, (NA,K, GLUC, HGB,HCT)     Status: None   Collection Time    09/19/12 12:46 PM      Result Value Range   Sodium 142  135 - 145 mEq/L   Potassium 3.6  3.5 - 5.1 mEq/L   Glucose, Bld 97  70 - 99 mg/dL   HCT 09.8  11.9 - 14.7 %   Hemoglobin 14.3  12.0 - 15.0 g/dL   No results found.  Review of Systems  Constitutional: Negative.   HENT: Negative.   Eyes: Negative.   Cardiovascular: Negative.   Gastrointestinal: Negative.   Genitourinary: Negative.   Musculoskeletal: Positive for joint pain.  Skin: Negative.   Neurological: Negative.   Endo/Heme/Allergies: Negative.   Psychiatric/Behavioral: Negative.     Blood pressure  141/95, pulse 93, temperature 98.5 F (36.9 C), temperature source Oral, resp. rate 18, height 5\' 6"  (1.676 m), weight 122.925 kg (271 lb), last menstrual period 08/18/2012, SpO2 97.00%. Physical Exam  Constitutional: She is oriented to person, place, and time. She appears well-developed.  HENT:  Head: Normocephalic.  Eyes: EOM are normal.  Neck: Normal range of motion.  Cardiovascular: Normal rate, regular rhythm and intact distal pulses.   Respiratory: Effort normal.  GI: Soft.  Genitourinary:  deffered  Musculoskeletal: She exhibits tenderness.  Neurological: She is alert and oriented to person, place, and time.  Skin: Skin is warm and dry.  Psychiatric: Her behavior is normal.     Assessment/Plan Shoulder Arthroscopy WLOP: Possible RCR, Labral, SAD, DCR.   D/C home today with family care. Take medications as directed. Follow post-op instructions. Follow up in office in 7-10 days from surgery.   Syncere Eble L 09/19/2012, 2:00 PM

## 2012-09-19 NOTE — Op Note (Signed)
Preop diagnosis right shoulder possible labral tear, impingement syndrome possible rotator cuff tear, a.c. arthritis. Postop diagnosis 1 right shoulder type I glenoid labral tearing #2 rotator cuff impingement syndrome 20% partial rotator cuff tear supraspinatous. 3 symptomatic a.c. joint osteoarthritis  Seizure #1 right shoulder glenohumeral arthroscopy with labral debridement. #2 arthroscopic subacromial decompression with acromioplasty bursectomy CA ligament release and debridement of partial rotator cuff tear. #3 arthroscopic distal clavicle resection Mumford procedure Surgeon Dana Washington M.D. Assistant Dana Sequin, PA-C Anesthesia interscalene block general Estimated blood loss minimal Drains none Complications none Disposition PACU stable  Operative details Patient was counseled in the holding area interscalene block administered cracks out was marked. The way the operating room IV Ancef was given. In the or placed supine position general anesthesia. PAS stockings applied for DVT prophylaxis gently turned into a left lateral decubitus position probably padded. Right shoulder was examined full range of motion stable prepped with DuraPrep draped into a sterile fashion. Using shoulder positioner 30 abduction 10 of full flexion 15 pounds of longitudinal traction to the size drawn. Dana Washington out had been done and confirmed. Posterior portal was crated arthroscope placed into the glenohumeral joint. Diagnostic arthroscopy the Bicipital is a type I labral tear into superior posterior without descent was as the biceps anchor. Antral portal was made an outside in technique through the rotator cuff interval. But Russians and reduced debridement tissue back to healthy tissue and is for down the cautery system. Her to cartilage healthy source which was normal the humeral ligaments were normal supraspinous insertion site he was reversed of the capitate healthy tissue and appeared to be approximately 20%  partial rotator cuff tear supraspinatous with remaining necrotic tissue.  Arthroscope was placed into the subacromial region with a subacromial bursitis was encountered. Lateral portal was established neurovascular structures were protected including a sling. But Russians and reduced a subacromial bursectomy was performed. Rotator cuff was palpated on the bursal side with the tip was found to be satisfactory she satisfactory thickness and did not block his hip and it was involved and a high-grade tear. This: MRI scan. ArthroCare system was utilized to release the periosteum CA ligament as there was some scuffing of the, rotator cuff underneath the coracoacromial arch. Burs and placed posteriorly an anterior inferior acromioplasty was performed convert to a flat acromion acromion type I morphology. A.c. joint be markedly osteoarthritic with sclerotic distal bone. The bur was placed anterior and the lateral 8-10 mm of the clavicle is was removed circumferentially in the superior capsule intact copper palpated down be stable debris was removed hemostasis was obtained. Also noted that the clavicle back to healthy appearing bone described to sclerotic hyperemic bone laterally consistent with primary pathology. Following this the air was a debridement without masses noted irrigated also [place] was removed taken at traction. Normal pulses. Portals closed 4 nylon suture. The 10 cc of 0.5 Sensorcaine for skin subacromial region stertorous by the shoulder turned supine awakened placed into a sling and taken operating room Dana Washington condition. No complications or problems. She restabilize impacted discharge to home.  Help with patient positioning prepping draping technical and surgical assistance throughout the entire case wound closure application dressing and sling Mr. Dana Sequin, PA-C assistance was needed, presented Dana Washington thank you

## 2012-09-19 NOTE — Anesthesia Procedure Notes (Signed)
Anesthesia Regional Block:  Interscalene brachial plexus block  Pre-Anesthetic Checklist: ,, timeout performed, Correct Patient, Correct Site, Correct Laterality, Correct Procedure, Correct Position, site marked, Risks and benefits discussed,  Surgical consent,  Pre-op evaluation,  At surgeon's request and post-op pain management  Laterality: Right  Prep: chloraprep       Needles:  Injection technique: Single-shot  Needle Type: Stimiplex     Needle Length:cm 9 cm Needle Gauge: 20 and 20 G    Additional Needles:  Procedures: ultrasound guided (picture in chart) Interscalene brachial plexus block Narrative:  Start time: 09/19/2012 1:30 PM End time: 09/19/2012 1:40 PM Injection made incrementally with aspirations every 5 mL.  Performed by: Personally  Anesthesiologist: Gaetano Hawthorne MD  Additional Notes: Patient tolerated the procedure well without complications  Interscalene brachial plexus block

## 2012-09-19 NOTE — Transfer of Care (Signed)
Immediate Anesthesia Transfer of Care Note  Patient: Dana Washington  Procedure(s) Performed: Procedure(s) (LRB): RIGHT SHOULDER EXAMINE UNDER ANESTHESIA, ARTHROSCOPY,  DEBRIDEMENT, SUBACROMIAL DECOMPRESSION, DISTAL CLAVICLE RESECTION AND  ROTATOR CUFF REPAIR, LABRAL REPAIR  (Right)  Patient Location: PACU  Anesthesia Type: General  Level of Consciousness: awake, oriented, sedated and patient cooperative  Airway & Oxygen Therapy: Patient Spontanous Breathing and Patient connected to face mask oxygen  Post-op Assessment: Report given to PACU RN and Post -op Vital signs reviewed and stable  Post vital signs: Reviewed and stable  Complications: No apparent anesthesia complications

## 2012-09-19 NOTE — Anesthesia Preprocedure Evaluation (Addendum)
Anesthesia Evaluation  Patient identified by MRN, date of birth, ID band Patient awake    Reviewed: Allergy & Precautions, H&P , NPO status , Patient's Chart, lab work & pertinent test results  History of Anesthesia Complications (+) PONV  Airway Mallampati: II TM Distance: >3 FB Neck ROM: full    Dental no notable dental hx. (+) Teeth Intact and Dental Advisory Given   Pulmonary neg pulmonary ROS,  breath sounds clear to auscultation  Pulmonary exam normal       Cardiovascular Exercise Tolerance: Good hypertension, Pt. on medications Rhythm:regular Rate:Normal     Neuro/Psych negative neurological ROS  negative psych ROS   GI/Hepatic negative GI ROS, Neg liver ROS,   Endo/Other  Hypothyroidism Morbid obesityImpaired glucose tolerance.  History Hashimoto's thyroiditis  Renal/GU negative Renal ROS  negative genitourinary   Musculoskeletal   Abdominal (+) + obese,   Peds  Hematology negative hematology ROS (+)   Anesthesia Other Findings   Reproductive/Obstetrics negative OB ROS                         Anesthesia Physical Anesthesia Plan  ASA: II  Anesthesia Plan: General   Post-op Pain Management:    Induction: Intravenous  Airway Management Planned: Oral ETT  Additional Equipment:   Intra-op Plan:   Post-operative Plan: Extubation in OR  Informed Consent: I have reviewed the patients History and Physical, chart, labs and discussed the procedure including the risks, benefits and alternatives for the proposed anesthesia with the patient or authorized representative who has indicated his/her understanding and acceptance.   Dental Advisory Given  Plan Discussed with: CRNA and Surgeon  Anesthesia Plan Comments:         Anesthesia Quick Evaluation

## 2012-09-19 NOTE — H&P (Signed)
I have seen and examined this patient.  Agree with the note above.  Cheri Ayotte ANDREW 09/19/2012 2:11 PM

## 2012-09-20 ENCOUNTER — Encounter (HOSPITAL_BASED_OUTPATIENT_CLINIC_OR_DEPARTMENT_OTHER): Payer: Self-pay | Admitting: Specialist

## 2012-09-21 NOTE — Anesthesia Postprocedure Evaluation (Signed)
  Anesthesia Post-op Note  Patient: Dana Washington  Procedure(s) Performed: Procedure(s) (LRB): RIGHT SHOULDER EXAMINE UNDER ANESTHESIA, ARTHROSCOPY,  DEBRIDEMENT, SUBACROMIAL DECOMPRESSION, DISTAL CLAVICLE RESECTION AND  ROTATOR CUFF REPAIR, LABRAL REPAIR  (Right)  Patient Location: PACU  Anesthesia Type: GA combined with regional for post-op pain  Level of Consciousness: awake and alert   Airway and Oxygen Therapy: Patient Spontanous Breathing  Post-op Pain: mild  Post-op Assessment: Post-op Vital signs reviewed, Patient's Cardiovascular Status Stable, Respiratory Function Stable, Patent Airway and No signs of Nausea or vomiting  Last Vitals:  Filed Vitals:   09/19/12 1800  BP: 113/72  Pulse: 103  Temp: 36.1 C  Resp: 16    Post-op Vital Signs: stable   Complications: No apparent anesthesia complications

## 2012-09-27 ENCOUNTER — Ambulatory Visit: Payer: 59 | Attending: Specialist

## 2012-09-27 DIAGNOSIS — IMO0001 Reserved for inherently not codable concepts without codable children: Secondary | ICD-10-CM | POA: Insufficient documentation

## 2012-09-27 DIAGNOSIS — M25519 Pain in unspecified shoulder: Secondary | ICD-10-CM | POA: Insufficient documentation

## 2012-09-28 ENCOUNTER — Ambulatory Visit: Payer: 59 | Admitting: Rehabilitation

## 2012-10-02 ENCOUNTER — Ambulatory Visit: Payer: 59 | Admitting: Rehabilitation

## 2012-10-03 ENCOUNTER — Ambulatory Visit: Payer: 59 | Admitting: Physical Therapy

## 2012-10-04 ENCOUNTER — Ambulatory Visit: Payer: 59

## 2012-10-05 ENCOUNTER — Ambulatory Visit: Payer: 59

## 2012-10-09 ENCOUNTER — Ambulatory Visit: Payer: 59 | Admitting: Rehabilitation

## 2012-10-12 ENCOUNTER — Ambulatory Visit: Payer: 59 | Attending: Specialist

## 2012-10-12 DIAGNOSIS — M25519 Pain in unspecified shoulder: Secondary | ICD-10-CM | POA: Insufficient documentation

## 2012-10-12 DIAGNOSIS — IMO0001 Reserved for inherently not codable concepts without codable children: Secondary | ICD-10-CM | POA: Insufficient documentation

## 2012-10-17 ENCOUNTER — Ambulatory Visit: Payer: 59 | Admitting: Rehabilitation

## 2012-10-19 ENCOUNTER — Ambulatory Visit: Payer: 59

## 2012-10-24 ENCOUNTER — Ambulatory Visit: Payer: 59

## 2012-10-26 ENCOUNTER — Ambulatory Visit: Payer: 59 | Admitting: Rehabilitation

## 2012-10-31 NOTE — Op Note (Signed)
This is a redictation on 10/31/2012 all Alanee Rawlinson do to the previous dictation been inaccurate in several areas Preoperative diagnosis right shoulder possible labral tear, impingement syndrome possible rotator cuff tear, a.c. joint arthritis Postoperative diagnosis #1 right shoulder type I glenoid labral tearing #2 rotator cuff impingement syndrome 20% partial thickness supraspinatous #3 symptomatic a.c. joint osteoarthritis Procedure 1 right shoulder glenohumeral arthroscopy with labral debridement #2 arthroscopic subacromial decompression with acromioplasty bursectomy and CA ligament release and debridement of partial rotator cuff tear #3 arthroscopic distal clavicle resection Mumford procedure Surgeon Valma Cava M.D. Assistant Marciano Sequin PA-C Anesthesia Gen. with interscalene block Estimated blood loss minimal Drains none Complications none Disposition PACU stable  Operative details Was counseled in the holding area interscalene block was administered. Taken to the operating room IV Ancef was given on the way. Placed under general anesthesia. Turned into a left lateral decubitus position probably padded. Right shoulder for range of motion and stable prepped with DuraPrep and draped into a sterile fashion. Overhead shoulder positioner was utilized. Elijah Birk out had been done. Arthroscopic portals were established posterior glenohumeral joint evaluation revealed type I glenoid labral tearing undersurface rotator cuff of the 22nd partial tear. Glenohumeral ligaments normal synovium normal articular surface normal stability normal. Anterior portal made outside in technique shaver introduced labral tear debris with a right shaver. Smoothed down cautery. Partial rotator cuff tear debris with motorized shaver back to healthy edges. 20% thickness. Arthroscope was placed into the subacromial region lateral portal was established neurovascular structures were protected inspected axillary nerve  shaver was introduced a subacromial bursectomy was performed for extreme bursitis. R. utilized to release subacromial attachment of CA ligament and periosteum burs placed posteriorly and an anterior inferior acromioplasty was performed convert into a flat acromion morphology rotator cuff bursal surface showed mild fraying underneath the coracoacromial arch but no frank tear. A.c. joint was found the arthritic burs placed anterior and the lateral 5-8 mm of the distal clavicle was removed leaving the superior capsule intact the clavicle was palpated down be stable debris was removed hemostasis was obtained. There was no other abnormalities noted irrigated and arthroscopic equipment was removed. Taken out of traction. Normal pulses. Portals closed with 4-0 nylon suture. 10 cc 0.25% Marcaine injected portal sites subacromial. Sterile dressing was applied sling turned supine awakened taken from operating room to PACU in stable condition. We stabilized the PACU and discharge to home. Mr. Marciano Sequin, PA-C assistance was needed throughout the entire case prepping and draping, arthroscopic assistance, wound closure, dressing application, and sling.

## 2012-11-03 ENCOUNTER — Ambulatory Visit: Payer: 59

## 2012-11-03 ENCOUNTER — Other Ambulatory Visit: Payer: Self-pay | Admitting: Internal Medicine

## 2012-11-07 ENCOUNTER — Ambulatory Visit: Payer: 59

## 2012-11-07 ENCOUNTER — Ambulatory Visit: Payer: 59 | Admitting: Rehabilitation

## 2012-11-09 ENCOUNTER — Ambulatory Visit: Payer: 59

## 2012-11-14 ENCOUNTER — Ambulatory Visit: Payer: 59

## 2012-11-16 ENCOUNTER — Other Ambulatory Visit: Payer: Self-pay

## 2012-11-16 ENCOUNTER — Ambulatory Visit: Payer: 59 | Attending: Specialist

## 2012-11-16 DIAGNOSIS — M25519 Pain in unspecified shoulder: Secondary | ICD-10-CM | POA: Insufficient documentation

## 2012-11-16 DIAGNOSIS — IMO0001 Reserved for inherently not codable concepts without codable children: Secondary | ICD-10-CM | POA: Insufficient documentation

## 2012-12-21 ENCOUNTER — Other Ambulatory Visit: Payer: Self-pay | Admitting: Internal Medicine

## 2012-12-25 ENCOUNTER — Other Ambulatory Visit: Payer: Self-pay | Admitting: Internal Medicine

## 2013-01-29 ENCOUNTER — Other Ambulatory Visit: Payer: 59 | Admitting: Internal Medicine

## 2013-01-30 ENCOUNTER — Encounter: Payer: 59 | Admitting: Internal Medicine

## 2013-02-12 ENCOUNTER — Other Ambulatory Visit: Payer: Self-pay | Admitting: Internal Medicine

## 2013-02-20 ENCOUNTER — Other Ambulatory Visit: Payer: 59 | Admitting: Internal Medicine

## 2013-02-20 DIAGNOSIS — Z130 Encounter for screening for diseases of the blood and blood-forming organs and certain disorders involving the immune mechanism: Secondary | ICD-10-CM

## 2013-02-20 DIAGNOSIS — E785 Hyperlipidemia, unspecified: Secondary | ICD-10-CM

## 2013-02-20 DIAGNOSIS — I10 Essential (primary) hypertension: Secondary | ICD-10-CM

## 2013-02-20 DIAGNOSIS — Z1329 Encounter for screening for other suspected endocrine disorder: Secondary | ICD-10-CM

## 2013-02-20 DIAGNOSIS — R7301 Impaired fasting glucose: Secondary | ICD-10-CM

## 2013-02-20 DIAGNOSIS — E039 Hypothyroidism, unspecified: Secondary | ICD-10-CM

## 2013-02-20 DIAGNOSIS — Z13228 Encounter for screening for other metabolic disorders: Secondary | ICD-10-CM

## 2013-02-20 LAB — COMPREHENSIVE METABOLIC PANEL
ALBUMIN: 3.9 g/dL (ref 3.5–5.2)
ALK PHOS: 85 U/L (ref 39–117)
ALT: 14 U/L (ref 0–35)
AST: 16 U/L (ref 0–37)
BUN: 11 mg/dL (ref 6–23)
CALCIUM: 9.1 mg/dL (ref 8.4–10.5)
CO2: 24 mEq/L (ref 19–32)
CREATININE: 0.75 mg/dL (ref 0.50–1.10)
Chloride: 108 mEq/L (ref 96–112)
GLUCOSE: 93 mg/dL (ref 70–99)
POTASSIUM: 4.8 meq/L (ref 3.5–5.3)
Sodium: 141 mEq/L (ref 135–145)
Total Bilirubin: 0.3 mg/dL (ref 0.2–1.2)
Total Protein: 6.9 g/dL (ref 6.0–8.3)

## 2013-02-20 LAB — CBC WITH DIFFERENTIAL/PLATELET
BASOS PCT: 1 % (ref 0–1)
Basophils Absolute: 0 10*3/uL (ref 0.0–0.1)
EOS PCT: 1 % (ref 0–5)
Eosinophils Absolute: 0.1 10*3/uL (ref 0.0–0.7)
HEMATOCRIT: 42.1 % (ref 36.0–46.0)
HEMOGLOBIN: 14.1 g/dL (ref 12.0–15.0)
Lymphocytes Relative: 26 % (ref 12–46)
Lymphs Abs: 2 10*3/uL (ref 0.7–4.0)
MCH: 28.4 pg (ref 26.0–34.0)
MCHC: 33.5 g/dL (ref 30.0–36.0)
MCV: 84.7 fL (ref 78.0–100.0)
MONO ABS: 0.5 10*3/uL (ref 0.1–1.0)
MONOS PCT: 7 % (ref 3–12)
NEUTROS ABS: 5.1 10*3/uL (ref 1.7–7.7)
Neutrophils Relative %: 65 % (ref 43–77)
Platelets: 399 10*3/uL (ref 150–400)
RBC: 4.97 MIL/uL (ref 3.87–5.11)
RDW: 15.2 % (ref 11.5–15.5)
WBC: 7.7 10*3/uL (ref 4.0–10.5)

## 2013-02-20 LAB — HEMOGLOBIN A1C
Hgb A1c MFr Bld: 5.7 % — ABNORMAL HIGH (ref <5.7)
MEAN PLASMA GLUCOSE: 117 mg/dL — AB (ref <117)

## 2013-02-20 LAB — LIPID PANEL
Cholesterol: 178 mg/dL (ref 0–200)
HDL: 43 mg/dL (ref >39)
LDL CALC: 110 mg/dL — AB (ref 0–99)
TRIGLYCERIDES: 124 mg/dL (ref <150)
Total CHOL/HDL Ratio: 4.1 Ratio
VLDL: 25 mg/dL (ref 0–40)

## 2013-02-20 LAB — TSH: TSH: 0.792 u[IU]/mL (ref 0.350–4.500)

## 2013-02-21 LAB — VITAMIN D 25 HYDROXY (VIT D DEFICIENCY, FRACTURES): Vit D, 25-Hydroxy: 30 ng/mL (ref 30–89)

## 2013-02-27 ENCOUNTER — Ambulatory Visit (INDEPENDENT_AMBULATORY_CARE_PROVIDER_SITE_OTHER): Payer: 59 | Admitting: Internal Medicine

## 2013-02-27 VITALS — BP 127/85 | HR 117 | Temp 99.0°F | Resp 18 | Ht 66.0 in | Wt 271.8 lb

## 2013-02-27 DIAGNOSIS — R1110 Vomiting, unspecified: Secondary | ICD-10-CM

## 2013-02-27 DIAGNOSIS — E860 Dehydration: Secondary | ICD-10-CM

## 2013-02-27 DIAGNOSIS — R197 Diarrhea, unspecified: Secondary | ICD-10-CM

## 2013-02-27 DIAGNOSIS — A0811 Acute gastroenteropathy due to Norwalk agent: Secondary | ICD-10-CM

## 2013-02-27 LAB — POCT CBC
Granulocyte percent: 84.4 %G — AB (ref 37–80)
HEMATOCRIT: 46.8 % (ref 37.7–47.9)
Hemoglobin: 14.7 g/dL (ref 12.2–16.2)
LYMPH, POC: 1 (ref 0.6–3.4)
MCH: 28.8 pg (ref 27–31.2)
MCHC: 31.4 g/dL — AB (ref 31.8–35.4)
MCV: 91.6 fL (ref 80–97)
MID (cbc): 0.3 (ref 0–0.9)
MPV: 8.3 fL (ref 0–99.8)
POC Granulocyte: 7 — AB (ref 2–6.9)
POC LYMPH %: 11.5 % (ref 10–50)
POC MID %: 4.1 %M (ref 0–12)
Platelet Count, POC: 354 10*3/uL (ref 142–424)
RBC: 5.11 M/uL (ref 4.04–5.48)
RDW, POC: 14.7 %
WBC: 8.3 10*3/uL (ref 4.6–10.2)

## 2013-02-27 LAB — BASIC METABOLIC PANEL
BUN: 13 mg/dL (ref 6–23)
CALCIUM: 8.8 mg/dL (ref 8.4–10.5)
CO2: 21 mEq/L (ref 19–32)
CREATININE: 0.82 mg/dL (ref 0.50–1.10)
Chloride: 104 mEq/L (ref 96–112)
GLUCOSE: 102 mg/dL — AB (ref 70–99)
Potassium: 4 mEq/L (ref 3.5–5.3)
Sodium: 135 mEq/L (ref 135–145)

## 2013-02-27 LAB — GLUCOSE, POCT (MANUAL RESULT ENTRY): POC GLUCOSE: 95 mg/dL (ref 70–99)

## 2013-02-27 MED ORDER — ONDANSETRON HCL 8 MG PO TABS: 8.0000 mg | ORAL_TABLET | Freq: Three times a day (TID) | ORAL | Status: DC | PRN

## 2013-02-27 MED ORDER — ONDANSETRON 4 MG PO TBDP
4.0000 mg | ORAL_TABLET | Freq: Once | ORAL | Status: AC
  Administered 2013-02-27: 4 mg via ORAL

## 2013-02-27 NOTE — Patient Instructions (Addendum)
Norovirus Infection Norovirus illness is caused by a viral infection. The term norovirus refers to a group of viruses. Any of those viruses can cause norovirus illness. This illness is often referred to by other names such as viral gastroenteritis, stomach flu, and food poisoning. Anyone can get a norovirus infection. People can have the illness multiple times during their lifetime. CAUSES  Norovirus is found in the stool or vomit of infected people. It is easily spread from person to person (contagious). People with norovirus are contagious from the moment they begin feeling ill. They may remain contagious for as long as 3 days to 2 weeks after recovery. People can become infected with the virus in several ways. This includes:  Eating food or drinking liquids that are contaminated with norovirus.  Touching surfaces or objects contaminated with norovirus, and then placing your hand in your mouth.  Having direct contact with a person who is infected and shows symptoms. This may occur while caring for someone with illness or while sharing foods or eating utensils with someone who is ill. SYMPTOMS  Symptoms usually begin 1 to 2 days after ingestion of the virus. Symptoms may include:  Nausea.  Vomiting.  Diarrhea.  Stomach cramps.  Low-grade fever.  Chills.  Headache.  Muscle aches.  Tiredness. Most people with norovirus illness get better within 1 to 2 days. Some people become dehydrated because they cannot drink enough liquids to replace those lost from vomiting and diarrhea. This is especially true for young children, the elderly, and others who are unable to care for themselves. DIAGNOSIS  Diagnosis is based on your symptoms and exam. Currently, only state public health laboratories have the ability to test for norovirus in stool or vomit. TREATMENT  No specific treatment exists for norovirus infections. No vaccine is available to prevent infections. Norovirus illness is usually  brief in healthy people. If you are ill with vomiting and diarrhea, you should drink enough water and fluids to keep your urine clear or pale yellow. Dehydration is the most serious health effect that can result from this infection. By drinking oral rehydration solution (ORS), people can reduce their chance of becoming dehydrated. There are many commercially available pre-made and powdered ORS designed to safely rehydrate people. These may be recommended by your caregiver. Replace any new fluid losses from diarrhea or vomiting with ORS as follows:  If your child weighs 10 kg or less (22 lb or less), give 60 to 120 ml ( to  cup or 2 to 4 oz) of ORS for each diarrheal stool or vomiting episode.  If your child weighs more than 10 kg (more than 22 lb), give 120 to 240 ml ( to 1 cup or 4 to 8 oz) of ORS for each diarrheal stool or vomiting episode. HOME CARE INSTRUCTIONS   Follow all your caregiver's instructions.  Avoid sugar-free and alcoholic drinks while ill.  Only take over-the-counter or prescription medicines for pain, vomiting, diarrhea, or fever as directed by your caregiver. You can decrease your chances of coming in contact with norovirus or spreading it by following these steps:  Frequently wash your hands, especially after using the toilet, changing diapers, and before eating or preparing food.  Carefully wash fruits and vegetables. Cook shellfish before eating them.  Do not prepare food for others while you are infected and for at least 3 days after recovering from illness.  Thoroughly clean and disinfect contaminated surfaces immediately after an episode of illness using a bleach-based household cleaner.    Immediately remove and wash clothing or linens that may be contaminated with the virus.  Use the toilet to dispose of any vomit or stool. Make sure the surrounding area is kept clean.  Food that may have been contaminated by an ill person should be discarded. SEEK IMMEDIATE  MEDICAL CARE IF:   You develop symptoms of dehydration that do not improve with fluid replacement. This may include:  Excessive sleepiness.  Lack of tears.  Dry mouth.  Dizziness when standing.  Weak pulse. Document Released: 03/20/2002 Document Revised: 03/22/2011 Document Reviewed: 04/21/2009 [facility] Patient Information 2014 [place], [place]. Dehydration, Adult Dehydration is when you lose more fluids from the body than you take in. Vital organs like the kidneys, brain, and heart cannot function without a proper amount of fluids and salt. Any loss of fluids from the body can cause dehydration.  CAUSES   Vomiting.  Diarrhea.  Excessive sweating.  Excessive urine output.  Fever. SYMPTOMS  Mild dehydration  Thirst.  Dry lips.  Slightly dry mouth. Moderate dehydration  Very dry mouth.  Sunken eyes.  Skin does not bounce back quickly when lightly pinched and released.  Dark urine and decreased urine production.  Decreased tear production.  Headache. Severe dehydration  Very dry mouth.  Extreme thirst.  Rapid, weak pulse (more than 100 beats per minute at rest).  Cold hands and feet.  Not able to sweat in spite of heat and temperature.  Rapid breathing.  Blue lips.  Confusion and lethargy.  Difficulty being awakened.  Minimal urine production.  No tears. DIAGNOSIS  Your caregiver will diagnose dehydration based on your symptoms and your exam. Blood and urine tests will help confirm the diagnosis. The diagnostic evaluation should also identify the cause of dehydration. TREATMENT  Treatment of mild or moderate dehydration can often be done at home by increasing the amount of fluids that you drink. It is best to drink small amounts of fluid more often. Drinking too much at one time can make vomiting worse. Refer to the home care instructions below. Severe dehydration needs to be treated at the hospital where you will probably be given intravenous  (IV) fluids that contain water and electrolytes. HOME CARE INSTRUCTIONS   Ask your caregiver about specific rehydration instructions.  Drink enough fluids to keep your urine clear or pale yellow.  Drink small amounts frequently if you have nausea and vomiting.  Eat as you normally do.  Avoid:  Foods or drinks high in sugar.  Carbonated drinks.  Juice.  Extremely hot or cold fluids.  Drinks with caffeine.  Fatty, greasy foods.  Alcohol.  Tobacco.  Overeating.  Gelatin desserts.  Wash your hands well to avoid spreading bacteria and viruses.  Only take over-the-counter or prescription medicines for pain, discomfort, or fever as directed by your caregiver.  Ask your caregiver if you should continue all prescribed and over-the-counter medicines.  Keep all follow-up appointments with your caregiver. SEEK MEDICAL CARE IF:  You have abdominal pain and it increases or stays in one area (localizes).  You have a rash, stiff neck, or severe headache.  You are irritable, sleepy, or difficult to awaken.  You are weak, dizzy, or extremely thirsty. SEEK IMMEDIATE MEDICAL CARE IF:   You are unable to keep fluids down or you get worse despite treatment.  You have frequent episodes of vomiting or diarrhea.  You have blood or green matter (bile) in your vomit.  You have blood in your stool or your stool looks black and  tarry.  You have not urinated in 6 to 8 hours, or you have only urinated a small amount of very dark urine.  You have a fever.  You faint. MAKE SURE YOU:   Understand these instructions.  Will watch your condition.  Will get help right away if you are not doing well or get worse. Document Released: 12/28/2004 Document Revised: 03/22/2011 Document Reviewed: 08/17/2010 [facility] Patient Information 2014 [place], [place].

## 2013-02-27 NOTE — Progress Notes (Signed)
   Subjective:    Patient ID: Dana Washington, female    DOB: [birth year], 38 y.o.   MRN: [id number]  HPI Patient complains of vomiting, diarrhea, and bodyaches.  She works with [employer] and think she may have picked it up from there.  She states it feels like flu-like symptoms.  Symptoms all started last night.  Has had 3 episodes of vomiting usually after eating.  Has not eaten in two days.  Diarrhea now coming out clear.  Have abdominal pain.  Afraid to eat because it comes right up.  States that she feels very thirsty. Having 2-3 stools per hour, feels like her mouth is dry. Had fever to 101.   Review of Systems     Objective:   Physical Exam  Vitals reviewed. Constitutional: She is oriented to person, place, and time. She appears well-nourished. No distress.  HENT:  Head: Normocephalic.  Mouth/Throat: Oropharynx is clear and moist.  Eyes: EOM are normal.  Cardiovascular: Normal rate, regular rhythm and normal heart sounds.   Pulmonary/Chest: Effort normal and breath sounds normal.  Abdominal: Soft. She exhibits no mass. Bowel sounds are increased. There is no tenderness.  Neurological: She is alert and oriented to person, place, and time. She exhibits normal muscle tone. Coordination normal.  Psychiatric: She has a normal mood and affect. Her behavior is normal. Judgment and thought content normal.   119/84 lying  127/85 standing 94                  117  Zofran odt 8mg /trial oral therapy here and observe     Results for orders placed in visit on 02/27/13  GLUCOSE, POCT (MANUAL RESULT ENTRY)      Result Value Ref Range   POC Glucose 95  70 - 99 mg/dl  POCT CBC      Result Value Ref Range   WBC 8.3  4.6 - 10.2 K/uL   Lymph, poc 1.0  0.6 - 3.4   POC LYMPH PERCENT 11.5  10 - 50 %L   MID (cbc) 0.3  0 - 0.9   POC MID % 4.1  0 - 12 %M   POC Granulocyte 7.0 (*) 2 - 6.9   Granulocyte percent 84.4 (*) 37 - 80 %G   RBC 5.11  4.04 - 5.48 M/uL   Hemoglobin 14.7  12.2 - 16.2 g/dL   HCT, POC 46.8  37.7 - 47.9 %   MCV 91.6  80 - 97 fL   MCH, POC 28.8  27 - 31.2 pg   MCHC 31.4 (*) 31.8 - 35.4 g/dL   RDW, POC 14.7     Platelet Count, POC 354  142 - 424 K/uL   MPV 8.3  0 - 99.8 fL    Assessment & Plan:  Hold diuretic till better/Norovirus likely Stay home Hydrate/Zofran If worse need IV replacement

## 2013-03-08 ENCOUNTER — Encounter: Payer: 59 | Admitting: Internal Medicine

## 2013-03-15 ENCOUNTER — Encounter: Payer: Self-pay | Admitting: Internal Medicine

## 2013-03-15 ENCOUNTER — Ambulatory Visit (INDEPENDENT_AMBULATORY_CARE_PROVIDER_SITE_OTHER): Payer: 59 | Admitting: Internal Medicine

## 2013-03-15 VITALS — BP 120/82 | HR 80 | Temp 99.8°F | Ht 64.5 in | Wt 268.0 lb

## 2013-03-15 DIAGNOSIS — R7302 Impaired glucose tolerance (oral): Secondary | ICD-10-CM

## 2013-03-15 DIAGNOSIS — F32A Depression, unspecified: Secondary | ICD-10-CM

## 2013-03-15 DIAGNOSIS — E785 Hyperlipidemia, unspecified: Secondary | ICD-10-CM

## 2013-03-15 DIAGNOSIS — R7309 Other abnormal glucose: Secondary | ICD-10-CM

## 2013-03-15 DIAGNOSIS — F419 Anxiety disorder, unspecified: Secondary | ICD-10-CM

## 2013-03-15 DIAGNOSIS — E8881 Metabolic syndrome: Secondary | ICD-10-CM

## 2013-03-15 DIAGNOSIS — F329 Major depressive disorder, single episode, unspecified: Secondary | ICD-10-CM

## 2013-03-15 DIAGNOSIS — E039 Hypothyroidism, unspecified: Secondary | ICD-10-CM

## 2013-03-15 DIAGNOSIS — I10 Essential (primary) hypertension: Secondary | ICD-10-CM

## 2013-03-15 DIAGNOSIS — F341 Dysthymic disorder: Secondary | ICD-10-CM

## 2013-03-15 DIAGNOSIS — Z8669 Personal history of other diseases of the nervous system and sense organs: Secondary | ICD-10-CM

## 2013-03-15 DIAGNOSIS — E669 Obesity, unspecified: Secondary | ICD-10-CM

## 2013-03-15 MED ORDER — METFORMIN HCL 500 MG PO TABS: 500.0000 mg | ORAL_TABLET | Freq: Every day | ORAL | Status: DC

## 2013-03-15 MED ORDER — ESOMEPRAZOLE MAGNESIUM 40 MG PO CPDR: 40.0000 mg | DELAYED_RELEASE_CAPSULE | Freq: Every day | ORAL | Status: DC

## 2013-03-15 MED ORDER — SERTRALINE HCL 50 MG PO TABS: 50.0000 mg | ORAL_TABLET | Freq: Every day | ORAL | Status: DC

## 2013-03-15 MED ORDER — VALACYCLOVIR HCL 500 MG PO TABS: 500.0000 mg | ORAL_TABLET | Freq: Every day | ORAL | Status: DC

## 2013-03-15 NOTE — Progress Notes (Signed)
Subjective:    Patient ID: Dana Washington, female    DOB: [birth year], 38 y.o.   MRN: [id number]  HPI 38 year old White Female for health maintenance and evaluation of medical issues. Stress at work.  History of hypertension, hyperlipidemia, obesity, depression, migraine headaches, hypothyroidism, impaired glucose tolerance, metabolic syndrome.  First presented to this office December 2012 having moved from [place]. Was started on Pravachol while in [place] but discontinued it. Apparently hyperlipidemia is inherited from her father. History of depression treated with Cymbalta.  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 Single. No children. Sister lives here as well. Mother planning to move here. Social alcohol consumption.  Past medical history: Breast reduction surgery in may 2010. Subsequently had bilateral CTR February 2012. Lasik surgery 2008 for myopia. History of Hashimoto's  thyroiditis with resultant hypothyroidism. History of vitamin D deficiency. Benign lymph node biopsy in February 2010. History of seroma left breast.  History of bilateral carpal tunnel release 2012. Had shoulder surgery by Dr. Theda Sers September 2014. Had a 20% rotator cuff tear supraspinatus and osteoarthritis of the a.c. joint. Had partial clavicle resection. She had a glenoid labral tear .  Family history: Patient says there's a family history of early onset breast cancer. Father with history of prostate cancer. Mother with history of skin cancer. Sister with history of migraine headaches.    Review of Systems  Constitutional: Positive for fatigue.  HENT: Negative.   Respiratory: Negative.   Cardiovascular: Negative.   Gastrointestinal: Negative.   Endocrine:       History of hypothyroidism. History of impaired glucose tolerance  Genitourinary: Negative.   Neurological:       History of migraine headaches    Psychiatric/Behavioral:       Anxiety and depression       Objective:   Physical Exam  Vitals reviewed. Constitutional: She is oriented to person, place, and time. She appears well-developed and well-nourished. No distress.  HENT:  Head: Normocephalic and atraumatic.  Right Ear: External ear normal.  Left Ear: External ear normal.  Mouth/Throat: Oropharynx is clear and moist. No oropharyngeal exudate.  Eyes: Conjunctivae and EOM are normal. Pupils are equal, round, and reactive to light. Right eye exhibits no discharge. Left eye exhibits no discharge. No scleral icterus.  Neck: Neck supple. No JVD present. No thyromegaly present.  Cardiovascular: Normal rate, regular rhythm, normal heart sounds and intact distal pulses.   No murmur heard. Pulmonary/Chest: Effort normal and breath sounds normal. No respiratory distress. She has no rales. She exhibits no tenderness.  Abdominal: Soft. Bowel sounds are normal. She exhibits no distension and no mass. There is no rebound.  Genitourinary:  Deferred to GYN  Musculoskeletal: Normal range of motion. She exhibits no edema.  Lymphadenopathy:    She has no cervical adenopathy.  Neurological: She is alert and oriented to person, place, and time. She has normal reflexes. No cranial nerve deficit. Coordination normal.  Skin: Skin is warm and dry. No rash noted. She is not diaphoretic.  Psychiatric: She has a normal mood and affect. Her behavior is normal. Judgment and thought content normal.          Assessment & Plan:  Hypothyroidism-stable history of migraine headaches  Anxiety and depression some of which is related to work stress  Obesity  Hyperlipidemia  Hypertension  Metabolic syndrome  Status post breast reduction surgery  Plan: Start Zoloft 50 mg daily. Prescription for prophylactic Valtrex.  Start metformin 500 mg daily and return in 6 months. Recommend counseling for work stress.

## 2013-03-15 NOTE — Patient Instructions (Signed)
Start Zoloft 50 mg daily, Valtrex 500 mg daily and Metformin 500 mg daily. Return in 6 months. Call with progress report on meds in 4 weeks.

## 2013-04-02 ENCOUNTER — Telehealth: Payer: Self-pay | Admitting: Internal Medicine

## 2013-04-02 ENCOUNTER — Other Ambulatory Visit: Payer: Self-pay | Admitting: Internal Medicine

## 2013-04-02 MED ORDER — SERTRALINE HCL 100 MG PO TABS: 100.0000 mg | ORAL_TABLET | Freq: Every day | ORAL | Status: DC

## 2013-04-02 NOTE — Telephone Encounter (Signed)
Increase Zoloft to 100 mg daily and follow up here in 3-4 weeks. Takes time to increase Serotonin. Have E-scribed new scipt. Double what she has until that is gone then get new RX

## 2013-04-02 NOTE — Telephone Encounter (Signed)
LMOM for patient (pt advised ok to leave message on cell) with Dr. Verlene Mayer instructions.  Patient also given 4 wk f/u appointment for 4/30 @ 3:45 p.m.  Patient instructed to call the office and R/S if that appointment day/time is not suitable to her schedule.

## 2013-04-20 ENCOUNTER — Other Ambulatory Visit: Payer: Self-pay | Admitting: Internal Medicine

## 2013-05-10 ENCOUNTER — Ambulatory Visit (INDEPENDENT_AMBULATORY_CARE_PROVIDER_SITE_OTHER): Payer: 59 | Admitting: Internal Medicine

## 2013-05-10 ENCOUNTER — Encounter: Payer: Self-pay | Admitting: Internal Medicine

## 2013-05-10 VITALS — BP 116/76 | HR 84 | Temp 98.8°F | Wt 263.0 lb

## 2013-05-10 DIAGNOSIS — F429 Obsessive-compulsive disorder, unspecified: Secondary | ICD-10-CM

## 2013-05-10 DIAGNOSIS — F411 Generalized anxiety disorder: Secondary | ICD-10-CM

## 2013-05-10 MED ORDER — CLONAZEPAM 0.5 MG PO TABS: 0.5000 mg | ORAL_TABLET | Freq: Two times a day (BID) | ORAL | Status: DC

## 2013-05-10 MED ORDER — FLUOXETINE HCL 20 MG PO TABS: 20.0000 mg | ORAL_TABLET | Freq: Every day | ORAL | Status: DC

## 2013-05-10 NOTE — Progress Notes (Signed)
   Subjective:    Patient ID: Dana Washington, female    DOB: 1975-01-15, 38 y.o.   MRN: [id number]  HPI Does not feel that Zoloft 100 mg daily is working very well. Still remains anxious and this has been noted by some coworkers. Not sleeping well. Also tells me today she has OCD tendencies and has to check various rooms in her house before going to bed at night. Has been on Cymbalta in the remote past. Some stress with her house. May have to have a new roof which she did not anticipate. Grieving loss of her animals.    Review of Systems     Objective:   Physical Exam spent 15 minutes speaking with patient about these issues. Try to focus on OCD tendencies which was new information to me.        Assessment & Plan:  Anxiety  Obsessive-compulsive disorder  Plan: She's not sleeping well which I think is adding to her stress. Start Klonopin 0.5 mg one half to one by mouth twice daily as needed for sleep and anxiety. I think her day we'll do better she takes a small dose of Klonopin before going to work. Change Zoloft Prozac 20 mg daily. Reevaluate in 6-8 weeks.

## 2013-05-10 NOTE — Patient Instructions (Signed)
Change Zoloft to Prozac 20 mg daily. Take Klonopin 0.5 mg one half tablet every morning and 0.5 mg (entire tablet) at bedtime. Return in 6 weeks. Consider counseling.

## 2013-05-12 ENCOUNTER — Encounter: Payer: Self-pay | Admitting: Internal Medicine

## 2013-05-14 ENCOUNTER — Ambulatory Visit (INDEPENDENT_AMBULATORY_CARE_PROVIDER_SITE_OTHER): Payer: 59 | Admitting: Internal Medicine

## 2013-05-14 VITALS — BP 108/84 | Temp 99.1°F | Wt 263.0 lb

## 2013-05-14 DIAGNOSIS — N63 Unspecified lump in unspecified breast: Secondary | ICD-10-CM

## 2013-05-14 DIAGNOSIS — R222 Localized swelling, mass and lump, trunk: Secondary | ICD-10-CM

## 2013-05-14 DIAGNOSIS — N6310 Unspecified lump in the right breast, unspecified quadrant: Secondary | ICD-10-CM

## 2013-05-14 NOTE — Patient Instructions (Signed)
To have diagnostic mammogram in the very near future.

## 2013-05-15 ENCOUNTER — Other Ambulatory Visit: Payer: Self-pay | Admitting: Internal Medicine

## 2013-05-15 DIAGNOSIS — R222 Localized swelling, mass and lump, trunk: Secondary | ICD-10-CM

## 2013-05-16 ENCOUNTER — Encounter: Payer: Self-pay | Admitting: Internal Medicine

## 2013-05-16 NOTE — Progress Notes (Signed)
   Subjective:    Patient ID: Dana Washington, female    DOB: 04-29-1975, 38 y.o.   MRN: [id number]  HPI Patient has history of previous breast reduction surgery. Says that there was a Soraya right medial breast near her nipple after that surgery. Says it always feels abnormal in that area but nail she has noticed that there is a frank small hard lump.    Review of Systems     Objective:   Physical Exam small nodule right medial breast adjacent to the nipple 3:00 position. No adenopathy in axilla.        Assessment & Plan:  ? Seroma versus fibrocystic breast disease  Plan: Diagnostic mammogram. Patient is quite worried about this.

## 2013-05-21 ENCOUNTER — Ambulatory Visit
Admission: RE | Admit: 2013-05-21 | Discharge: 2013-05-21 | Disposition: A | Discharge status: Home / Self Care | Payer: 59 | Source: Ambulatory Visit | Attending: Internal Medicine | Admitting: Internal Medicine

## 2013-05-21 DIAGNOSIS — R222 Localized swelling, mass and lump, trunk: Secondary | ICD-10-CM

## 2013-05-24 ENCOUNTER — Other Ambulatory Visit: Payer: 59

## 2013-05-25 ENCOUNTER — Other Ambulatory Visit: Payer: 59

## 2013-06-22 ENCOUNTER — Encounter: Payer: Self-pay | Admitting: Internal Medicine

## 2013-06-22 ENCOUNTER — Ambulatory Visit (INDEPENDENT_AMBULATORY_CARE_PROVIDER_SITE_OTHER): Payer: 59 | Admitting: Internal Medicine

## 2013-06-22 VITALS — BP 126/74 | HR 80 | Temp 98.5°F | Wt 261.0 lb

## 2013-06-22 DIAGNOSIS — F411 Generalized anxiety disorder: Secondary | ICD-10-CM

## 2013-06-22 DIAGNOSIS — F429 Obsessive-compulsive disorder, unspecified: Secondary | ICD-10-CM

## 2013-06-22 MED ORDER — FLUOXETINE HCL 20 MG PO TABS: 20.0000 mg | ORAL_TABLET | Freq: Every day | ORAL | Status: DC

## 2013-06-27 ENCOUNTER — Other Ambulatory Visit: Payer: Self-pay | Admitting: Internal Medicine

## 2013-07-09 ENCOUNTER — Other Ambulatory Visit: Payer: Self-pay | Admitting: Internal Medicine

## 2013-07-09 DIAGNOSIS — F429 Obsessive-compulsive disorder, unspecified: Secondary | ICD-10-CM | POA: Insufficient documentation

## 2013-07-09 DIAGNOSIS — F419 Anxiety disorder, unspecified: Secondary | ICD-10-CM | POA: Insufficient documentation

## 2013-07-09 DIAGNOSIS — F411 Generalized anxiety disorder: Secondary | ICD-10-CM | POA: Insufficient documentation

## 2013-07-09 NOTE — Patient Instructions (Signed)
Continue Prozac 20 mg daily. Has six-month recheck appointment September 25

## 2013-07-09 NOTE — Progress Notes (Signed)
   Subjective:    Patient ID: Dana Washington, female    DOB: 12/29/1975, 38 y.o.   MRN: [id number]  HPI  At last visit in April 2015, It was noted that she did not think Zoloft 100 mg daily was working well. She was changed to Prozac with followup today. History of OCD tendencies. Has also been on Cymbalta in the past. Patient reports that she's feeling much better. She also has a history of anxiety. She's pleased with the change to Prozac.  She was also started on Klonopin at last visit for anxiety and insomnia. Has done well with that.    Review of Systems     Objective:   Physical Exam  Spent 15 minutes speaking with patient about these issues      Assessment & Plan:  OCD  Depression   Anxiety  OCD  Depression  Plan: Continue Prozac 20 mg daily. Continue Klonopin twice daily as needed. Keep appointment September 2015 for six-month recheck.

## 2013-07-16 ENCOUNTER — Ambulatory Visit (INDEPENDENT_AMBULATORY_CARE_PROVIDER_SITE_OTHER): Payer: 59 | Admitting: Internal Medicine

## 2013-07-16 ENCOUNTER — Encounter: Payer: Self-pay | Admitting: Internal Medicine

## 2013-07-16 VITALS — BP 108/62 | HR 76 | Temp 99.1°F | Wt 258.0 lb

## 2013-07-16 DIAGNOSIS — Z87898 Personal history of other specified conditions: Secondary | ICD-10-CM

## 2013-07-16 DIAGNOSIS — M26609 Unspecified temporomandibular joint disorder, unspecified side: Secondary | ICD-10-CM

## 2013-07-16 MED ORDER — INDOMETHACIN 50 MG PO CAPS: 50.0000 mg | ORAL_CAPSULE | Freq: Three times a day (TID) | ORAL | Status: DC

## 2013-07-16 MED ORDER — SCOPOLAMINE 1 MG/3DAYS TD PT72: 1.0000 | MEDICATED_PATCH | TRANSDERMAL | Status: DC

## 2013-07-16 NOTE — Patient Instructions (Signed)
Take Indocin 50 mg 3 times daily with meals for 10 days. Apply ice to right TM joint 20 minutes twice daily. Transderm-Scop has been prescribed for your trip to [place] for motion sickness

## 2013-07-18 NOTE — Progress Notes (Signed)
   Subjective:    Patient ID: Dana Washington, female    DOB: [birth year], 38 y.o.   MRN: [id number]  HPI   Complaining of right ear pain which is bothering her all day long. She keeps sticking objects in her ear because of pain and discomfort. There is no itching. There is no drainage. She's not been swimming recently. Someone at work suggested she seek medical attention. Also she's going on a trip to [place] in August and needs something for motion sickness. She has a history of migraine headaches and motion sickness is associated with migraines. Frequently gets nausea and vomiting when she flies despite taking Dramamine.    Review of Systems     Objective:   Physical Exam Both external ear canals are clear. Right TM is slightly full but not red at all. Left TM is clear. She has considerable tenderness right TM joint with popping        Assessment & Plan:  Right TMJ syndrome  Motion sickness  Plan: Prescribe Transderm-Scop patches for trip to [place]. For TMJ, Indocin 50 mg 3 times a day with food. Apply ice to right TM joint 20 minutes twice daily. May need to have nightguard to prevent bruxism.

## 2013-09-04 ENCOUNTER — Other Ambulatory Visit: Payer: Self-pay | Admitting: Internal Medicine

## 2013-09-20 ENCOUNTER — Other Ambulatory Visit: Payer: 59 | Admitting: Internal Medicine

## 2013-09-20 DIAGNOSIS — E039 Hypothyroidism, unspecified: Secondary | ICD-10-CM

## 2013-09-20 DIAGNOSIS — I10 Essential (primary) hypertension: Secondary | ICD-10-CM

## 2013-09-20 DIAGNOSIS — R7309 Other abnormal glucose: Secondary | ICD-10-CM

## 2013-09-20 DIAGNOSIS — E781 Pure hyperglyceridemia: Secondary | ICD-10-CM

## 2013-09-20 LAB — HEPATIC FUNCTION PANEL
ALBUMIN: 4.1 g/dL (ref 3.5–5.2)
ALT: 12 U/L (ref 0–35)
AST: 13 U/L (ref 0–37)
Alkaline Phosphatase: 70 U/L (ref 39–117)
BILIRUBIN DIRECT: 0.1 mg/dL (ref 0.0–0.3)
BILIRUBIN INDIRECT: 0.1 mg/dL — AB (ref 0.2–1.2)
Total Bilirubin: 0.2 mg/dL (ref 0.2–1.2)
Total Protein: 7.1 g/dL (ref 6.0–8.3)

## 2013-09-20 LAB — LIPID PANEL
Cholesterol: 190 mg/dL (ref 0–200)
HDL: 50 mg/dL (ref >39)
LDL CALC: 107 mg/dL — AB (ref 0–99)
Total CHOL/HDL Ratio: 3.8 Ratio
Triglycerides: 166 mg/dL — ABNORMAL HIGH (ref <150)
VLDL: 33 mg/dL (ref 0–40)

## 2013-09-20 LAB — HEMOGLOBIN A1C
Hgb A1c MFr Bld: 6 % — ABNORMAL HIGH (ref <5.7)
MEAN PLASMA GLUCOSE: 126 mg/dL — AB (ref <117)

## 2013-09-21 ENCOUNTER — Ambulatory Visit: Payer: 59 | Admitting: Internal Medicine

## 2013-09-21 LAB — TSH: TSH: 1.99 u[IU]/mL (ref 0.350–4.500)

## 2013-10-16 ENCOUNTER — Ambulatory Visit: Payer: 59 | Admitting: Internal Medicine

## 2013-10-19 ENCOUNTER — Ambulatory Visit (INDEPENDENT_AMBULATORY_CARE_PROVIDER_SITE_OTHER): Payer: 59 | Admitting: Internal Medicine

## 2013-10-19 ENCOUNTER — Encounter: Payer: Self-pay | Admitting: Internal Medicine

## 2013-10-19 VITALS — BP 100/78 | HR 68 | Temp 98.8°F | Ht 65.5 in | Wt 255.0 lb

## 2013-10-19 DIAGNOSIS — E785 Hyperlipidemia, unspecified: Secondary | ICD-10-CM

## 2013-10-19 DIAGNOSIS — R7302 Impaired glucose tolerance (oral): Secondary | ICD-10-CM

## 2013-10-19 DIAGNOSIS — E669 Obesity, unspecified: Secondary | ICD-10-CM

## 2013-10-19 DIAGNOSIS — A084 Viral intestinal infection, unspecified: Secondary | ICD-10-CM

## 2013-10-19 MED ORDER — METFORMIN HCL 500 MG PO TABS: 500.0000 mg | ORAL_TABLET | Freq: Two times a day (BID) | ORAL | Status: DC

## 2013-10-19 NOTE — Progress Notes (Signed)
   Subjective:    Patient ID: Dana Washington, female    DOB: [birth year], 38 y.o.   MRN: [id number]  HPI Here today to followup on impaired glucose tolerance and hyperlipidemia. In the meantime as been to [place]. Had a good time there and probably had some dietary indiscretions including alcohol. Triglycerides are elevated. Onset Tuesday of gastroenteritis symptoms with diarrhea. No vomiting. No fever. Symptoms have begun to resolve. She is off work today. Is supposed to work tomorrow but told her she might not want to do that if she still symptomatic.    Review of Systems     Objective:   Physical Exam  Not examined. Spent 25 minutes speaking with patient about these issues.      Assessment & Plan:  Viral gastroenteritis-continue clear liquids if diarrhea is persistent. May take 10 days for symptoms to resolve. Advance diet slowly.  Impaired glucose tolerance-increase metformin 500 mg twice daily. Hemoglobin A1c actually increased slightly  Despite starting metformin 500 mg once daily at last visit  Hypertriglyceridemia-triglycerides are 166 however she's been no recent trip to [place]. Recheck in 4 months.  Plan: She'll return in February for fasting lipid panel and hemoglobin A1c.

## 2013-10-19 NOTE — Patient Instructions (Signed)
Increase metformin to 500 mg twice daily. Return in February for fasting lipid panel and hemoglobin A1c.

## 2013-11-23 ENCOUNTER — Other Ambulatory Visit: Payer: Self-pay | Admitting: Internal Medicine

## 2013-12-17 ENCOUNTER — Other Ambulatory Visit: Payer: Self-pay | Admitting: Internal Medicine

## 2013-12-18 ENCOUNTER — Telehealth: Payer: Self-pay | Admitting: Internal Medicine

## 2013-12-18 NOTE — Telephone Encounter (Signed)
Patient has been on topamax for about 3 years.  She was wondering if this is something she should ween off of.  She will make an appointment to discuss the change.  She also has had some concerns about her pharmacy.  She received what she thought was a 90 day supply but she should still have 30 pills left and does not.  Advised patient to follow up with pharmacy.  New Rx was sent to pharmacy 12/17/2013.

## 2013-12-18 NOTE — Telephone Encounter (Signed)
She has decided that she wants to ween down off of Topamax.  She would like to cut back to 50 mg.  She wants to know if you would write her a script for 50 mg of Topamax.  She would like to titrate down over months.  Would you consider doing that?

## 2013-12-18 NOTE — Telephone Encounter (Signed)
Why does she want to decrease dose?

## 2013-12-24 ENCOUNTER — Ambulatory Visit: Payer: 59 | Admitting: Internal Medicine

## 2013-12-28 ENCOUNTER — Other Ambulatory Visit: Payer: Self-pay | Admitting: Internal Medicine

## 2014-01-07 ENCOUNTER — Ambulatory Visit (INDEPENDENT_AMBULATORY_CARE_PROVIDER_SITE_OTHER): Payer: 59 | Admitting: Physician Assistant

## 2014-01-07 VITALS — BP 124/82 | HR 108 | Temp 99.1°F | Resp 16 | Ht 64.25 in | Wt 257.0 lb

## 2014-01-07 DIAGNOSIS — R059 Cough, unspecified: Secondary | ICD-10-CM

## 2014-01-07 DIAGNOSIS — J029 Acute pharyngitis, unspecified: Secondary | ICD-10-CM

## 2014-01-07 DIAGNOSIS — R05 Cough: Secondary | ICD-10-CM

## 2014-01-07 LAB — POCT RAPID STREP A (OFFICE): RAPID STREP A SCREEN: NEGATIVE

## 2014-01-07 MED ORDER — BENZONATATE 100 MG PO CAPS: 100.0000 mg | ORAL_CAPSULE | Freq: Three times a day (TID) | ORAL | Status: DC | PRN

## 2014-01-07 MED ORDER — IPRATROPIUM BROMIDE 0.03 % NA SOLN: 2.0000 | Freq: Two times a day (BID) | NASAL | Status: DC

## 2014-01-07 MED ORDER — GUAIFENESIN ER 1200 MG PO TB12: 1.0000 | ORAL_TABLET | Freq: Two times a day (BID) | ORAL | Status: DC | PRN

## 2014-01-07 MED ORDER — HYDROCOD POLST-CHLORPHEN POLST 10-8 MG/5ML PO LQCR: 5.0000 mL | Freq: Two times a day (BID) | ORAL | Status: DC | PRN

## 2014-01-07 NOTE — Patient Instructions (Signed)
Take the tessalon during the day, and reserve the Tussionex for bedtime. Use the Mucinex to thin the mucous, making it easier to cough up and also less irritating to your throat. Get plenty of rest and drink at least 64 ounces of water daily.

## 2014-01-07 NOTE — Progress Notes (Signed)
Subjective:    Patient ID: Dana Washington, female    DOB: [birth year], 38 y.o.   MRN: [id number]   PCP: Elby Showers, MD  Chief Complaint  Patient presents with  . Sore Throat    x 5 days  . Cough    x 5 days    Allergies  Allergen Reactions  . Bee Venom Hives    Patient Active Problem List   Diagnosis Date Noted  . OCD (obsessive compulsive disorder) 07/09/2013  . Anxiety state, unspecified 07/09/2013  . Hypertension 07/10/2011  . Depression 06/11/2011  . History of migraine headaches 04/08/2011  . Hyperlipidemia 01/07/2011  . Impaired glucose tolerance 01/07/2011  . Obesity 01/07/2011  . Hypothyroidism 01/07/2011    Prior to Admission medications   Medication Sig Start Date End Date Taking? Authorizing Provider  atorvastatin (LIPITOR) 10 MG tablet TAKE 1 TABLET BY MOUTH ONCE DAILY 11/23/13  Yes Elby Showers, MD  benazepril-hydrochlorthiazide (LOTENSIN HCT) 20-12.5 MG per tablet TAKE 1 TABLET BY MOUTH ONCE DAILY 12/28/13  Yes Elby Showers, MD  butalbital-acetaminophen-caffeine (FIORICET, ESGIC) 50-325-40 MG per tablet Take 1 tablet by mouth 2 (two) times daily as needed for headache.   Yes Historical Provider, MD  clonazePAM (KLONOPIN) 0.5 MG tablet Take 1 tablet (0.5 mg total) by mouth 2 (two) times daily. 05/10/13  Yes Elby Showers, MD  esomeprazole (NEXIUM) 40 MG capsule Take 1 capsule (40 mg total) by mouth daily. 03/15/13  Yes Elby Showers, MD  FLUoxetine (PROZAC) 20 MG tablet Take 1 tablet (20 mg total) by mouth daily. 06/22/13  Yes Elby Showers, MD  LARIN FE 1.5/30 1.5-30 MG-MCG tablet TAKE 1 TABLET BY MOUTH DAILY. 04/20/13  Yes Elby Showers, MD  metFORMIN (GLUCOPHAGE) 500 MG tablet Take 1 tablet (500 mg total) by mouth 2 (two) times daily with a meal. 10/19/13  Yes Elby Showers, MD  SYNTHROID 100 MCG tablet TAKE 1 TABLET BY MOUTH DAILY 09/04/13  Yes Elby Showers, MD  topiramate (TOPAMAX) 100 MG tablet TAKE 1 TABLET BY MOUTH EVERY NIGHT AT BEDTIME 12/17/13  Yes  Elby Showers, MD  valACYclovir (VALTREX) 500 MG tablet Take 1 tablet (500 mg total) by mouth daily. Patient not taking: Reported on 01/07/2014 03/15/13   Elby Showers, MD    Medical, Surgical, Family and Social History reviewed and updated.  HPI  5 days of non-productive cough and sore throat. Nothing to blow out of her nose, "But I really wish I could." Her niece and nephew have had colds recently. 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 Unable to sleep due to persistent cough. Thinks she probably just has a cold, and is embarrassed to be here, but she's worried that she may have strep throat due to the severity of the sore throat.  No fever/chills. Notes she normally "runs a little lower that most." No GI/GU symptoms. No CP, SOB, HA, dizziness.   Review of Systems As above.    Objective:   Physical Exam  Constitutional: She is oriented to person, place, and time. She appears well-developed and well-nourished. No distress.  BP 124/82 mmHg  Pulse 108  Temp(Src) 99.1 F (37.3 C) (Oral)  Resp 16  Ht 5' 4.25" (1.632 m)  Wt 257 lb (116.574 kg)  BMI 43.77 kg/m2  SpO2 98%   HENT:  Head: Normocephalic and atraumatic.  Right Ear: Hearing, tympanic membrane, external ear and ear canal normal.  Left Ear:  Hearing, tympanic membrane, external ear and ear canal normal.  Nose: Mucosal edema present.  Mouth/Throat: Uvula is midline, oropharynx is clear and moist and mucous membranes are normal.  Eyes: Conjunctivae, EOM and lids are normal. Pupils are equal, round, and reactive to light. No scleral icterus.  Neck: Neck supple. No thyroid mass and no thyromegaly present.  Cardiovascular: Normal rate, regular rhythm, normal heart sounds and intact distal pulses.   Pulmonary/Chest: Effort normal and breath sounds normal.  Lymphadenopathy:       Head (right side): No tonsillar adenopathy present.       Head (left side): No tonsillar adenopathy present.    She  has no cervical adenopathy.  No palpable nodes in the neck, but she feels soreness with palpation.  Neurological: She is alert and oriented to person, place, and time.  Skin: Skin is warm and dry.  Psychiatric: She has a normal mood and affect. Her speech is normal and behavior is normal.   Results for orders placed or performed in visit on 01/07/14  POCT rapid strep A  Result Value Ref Range   Rapid Strep A Screen Negative Negative          Assessment & Plan:  1. Cough 2. Sore throat  Likely viral URI. Supportive care. Anticipatory guidance.  RTC or f/u with PCP if symptoms worsen/persist.  - POCT rapid strep A - Culture, Group A Strep  - ipratropium (ATROVENT) 0.03 % nasal spray; Place 2 sprays into both nostrils 2 (two) times daily.  Dispense: 30 mL; Refill: 0 - benzonatate (TESSALON) 100 MG capsule; Take 1-2 capsules (100-200 mg total) by mouth 3 (three) times daily as needed for cough.  Dispense: 40 capsule; Refill: 0 (USE FOR DAYTIME) - Guaifenesin (MUCINEX MAXIMUM STRENGTH) 1200 MG TB12; Take 1 tablet (1,200 mg total) by mouth every 12 (twelve) hours as needed.  Dispense: 14 tablet; Refill: 1 - chlorpheniramine-HYDROcodone (TUSSIONEX PENNKINETIC ER) 10-8 MG/5ML LQCR; Take 5 mLs by mouth every 12 (twelve) hours as needed for cough (cough).  Dispense: 60 mL; Refill: 0 (USE FOR BEDTIME)    Fara Chute, PA-C Physician Assistant-Certified [facility]

## 2014-01-10 ENCOUNTER — Encounter: Payer: Self-pay | Admitting: Internal Medicine

## 2014-01-10 ENCOUNTER — Ambulatory Visit (INDEPENDENT_AMBULATORY_CARE_PROVIDER_SITE_OTHER): Payer: 59 | Admitting: Internal Medicine

## 2014-01-10 VITALS — BP 118/82 | HR 96 | Temp 98.4°F | Wt 253.5 lb

## 2014-01-10 DIAGNOSIS — H6503 Acute serous otitis media, bilateral: Secondary | ICD-10-CM

## 2014-01-10 DIAGNOSIS — R059 Cough, unspecified: Secondary | ICD-10-CM

## 2014-01-10 DIAGNOSIS — J683 Other acute and subacute respiratory conditions due to chemicals, gases, fumes and vapors: Secondary | ICD-10-CM

## 2014-01-10 DIAGNOSIS — R05 Cough: Secondary | ICD-10-CM

## 2014-01-10 DIAGNOSIS — J029 Acute pharyngitis, unspecified: Secondary | ICD-10-CM

## 2014-01-10 DIAGNOSIS — J45901 Unspecified asthma with (acute) exacerbation: Secondary | ICD-10-CM

## 2014-01-10 DIAGNOSIS — J209 Acute bronchitis, unspecified: Secondary | ICD-10-CM

## 2014-01-10 LAB — CULTURE, GROUP A STREP: Organism ID, Bacteria: NORMAL

## 2014-01-10 MED ORDER — ALBUTEROL SULFATE HFA 108 (90 BASE) MCG/ACT IN AERS: 2.0000 | INHALATION_SPRAY | Freq: Four times a day (QID) | RESPIRATORY_TRACT | Status: DC | PRN

## 2014-01-10 MED ORDER — PREDNISONE 10 MG PO TABS: ORAL_TABLET | ORAL | Status: DC

## 2014-01-10 MED ORDER — FLUCONAZOLE 150 MG PO TABS: 150.0000 mg | ORAL_TABLET | Freq: Once | ORAL | Status: DC

## 2014-01-10 MED ORDER — LEVOFLOXACIN 500 MG PO TABS: 500.0000 mg | ORAL_TABLET | Freq: Every day | ORAL | Status: DC

## 2014-01-10 MED ORDER — HYDROCOD POLST-CHLORPHEN POLST 10-8 MG/5ML PO LQCR: 5.0000 mL | Freq: Two times a day (BID) | ORAL | Status: DC | PRN

## 2014-01-10 NOTE — Patient Instructions (Addendum)
Take levaquin 500 mg daily, prednisone taper as directed, Albuterol inhaler 4 times a day. Diflucan if needed for yeast infection. tussionex for cough

## 2014-01-10 NOTE — Progress Notes (Signed)
   Subjective:    Patient ID: Dana Washington, female    DOB: 1975-01-27, 38 y.o.   MRN: [id number]  HPI Patient went to urgent care December 28 with sore throat and cough for 5 days. 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 Rapid strep screen negative at urgent care.. Was given Atrovent nasal spray, Tessalon Perles, Tussionex cough syrup and Mucinex..  Has continued to have deep coughed and some shortness of breath despite having Tussionex. Some wheezing. No fever or shaking chills. Says sore throat is really the worst symptom besides the cough.    Review of Systems     Objective:   Physical Exam Deep congested cough. TMs are full bilaterally and pink but not red. Pharynx slightly injected. Rapid strep screen negative. Neck is supple without adenopathy. Chest clear to auscultation       Assessment & Plan:  Acute bronchitis  Pharyngitis  Reactive airways disease  Plan: Sterapred DS 10 mg 6 day dosepak. Refill Tussionex 115 mL 1 teaspoon by mouth every 8 hours when necessary cough. Levaquin 500 milligrams daily for 7 days. Albuterol inhaler 2 sprays by mouth 4 times a day when necessary cough or wheezing

## 2014-02-08 ENCOUNTER — Other Ambulatory Visit: Payer: Self-pay | Admitting: Internal Medicine

## 2014-02-23 IMAGING — MR MR SHOULDER*R* W/O CM
4 of 5 series · 19 of 40 positions shown · non-contrast
Comparison: None

CLINICAL DATA: Right shoulder pain.

MRI OF THE RIGHT SHOULDER WITHOUT CONTRAST
TECHNIQUE: Multiplanar, multisequence MR imaging of the  shoulder
was performed.  No intravenous contrast was administered.

[Series 3: T2 fat-sat · axial · 4.0mm · 0.23mm/px · z∈[-63,+17]mm · 5 of 20 slices shown (1 of 3)]
[im 1/20]
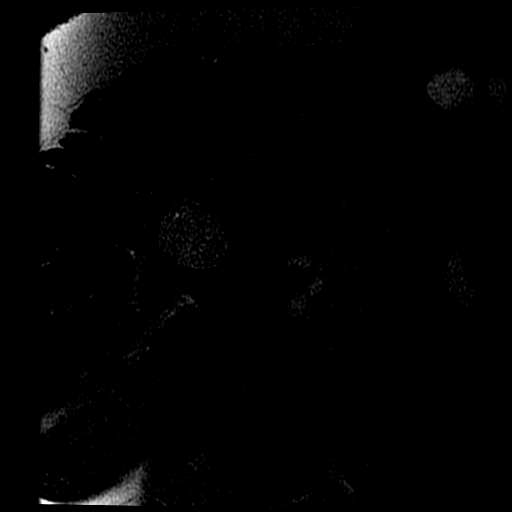
[im 3/20]
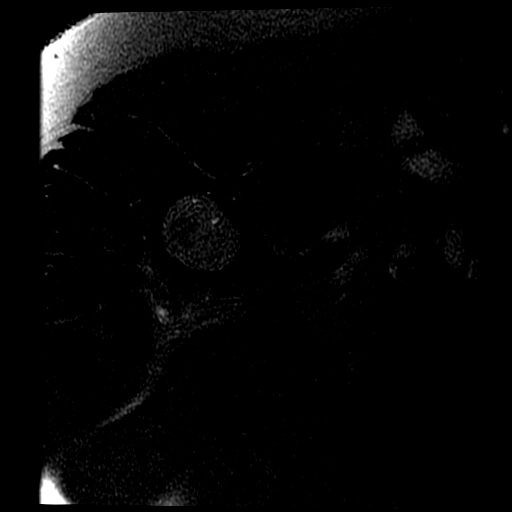
[im 6/20]
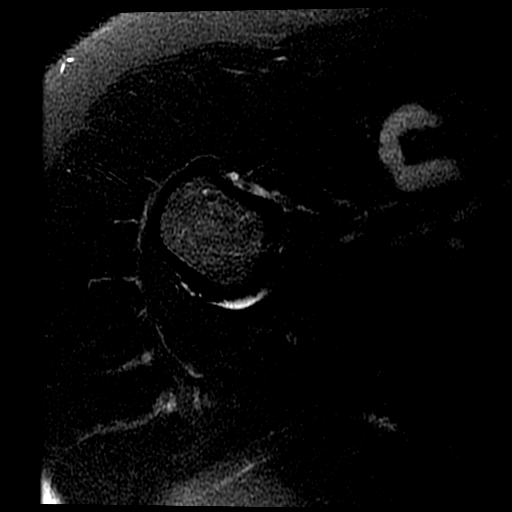
[im 11/20]
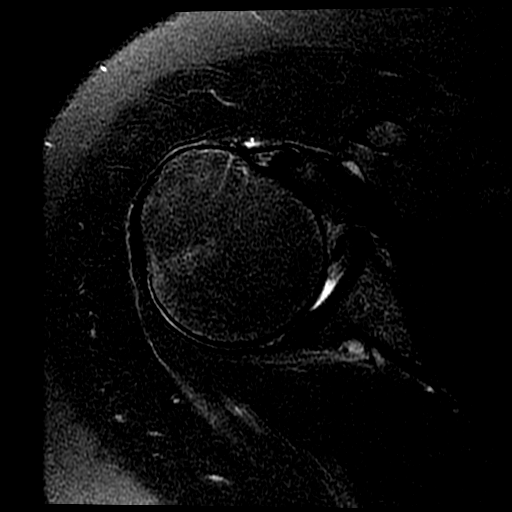
[im 17/20]
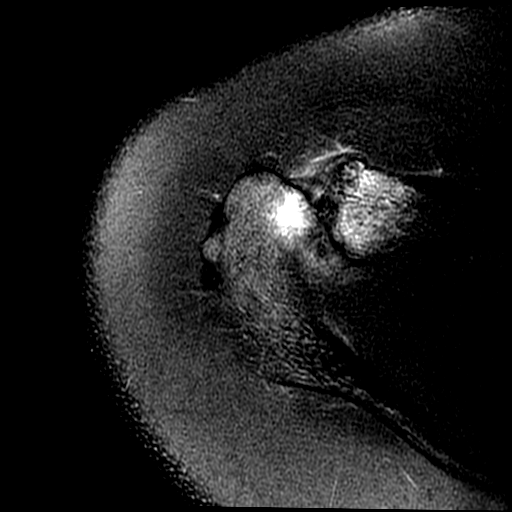

[Series 4: T2 fat-sat · oblique · 4.0mm · 0.27mm/px · 3 of 18 slices shown (2 of 3)]
[im 3/18]
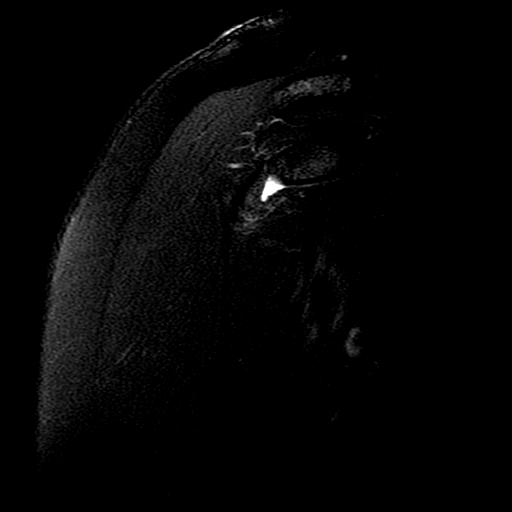
[im 10/18]
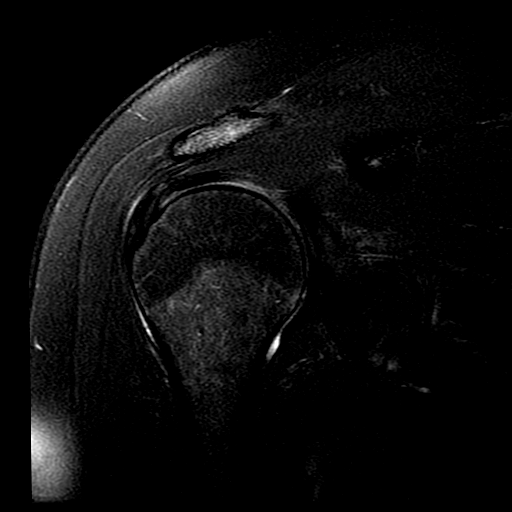
[im 15/18]
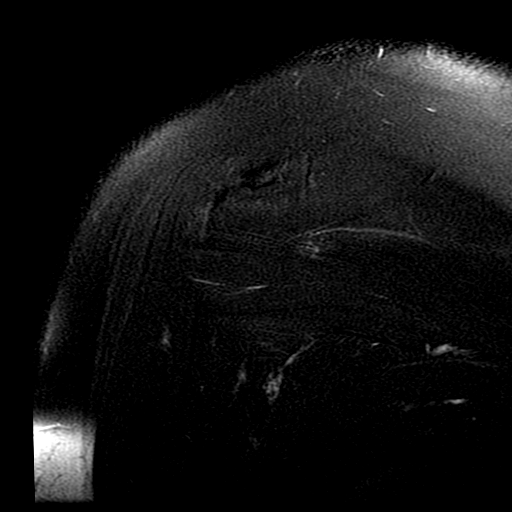

[Series 5: PD fat-sat · oblique · 4.0mm · 0.27mm/px · 8 of 18 slices shown]
[im 1/18]
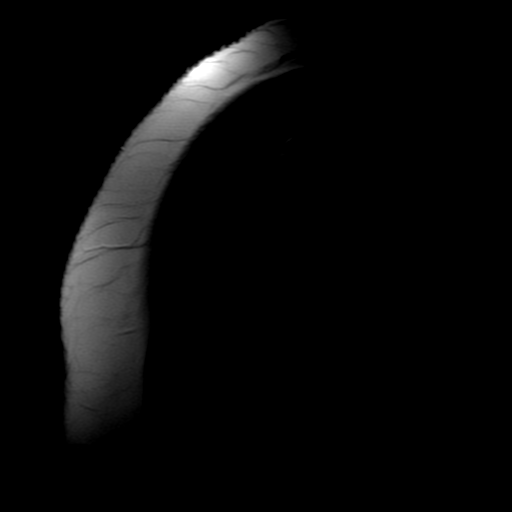
[im 3/18]
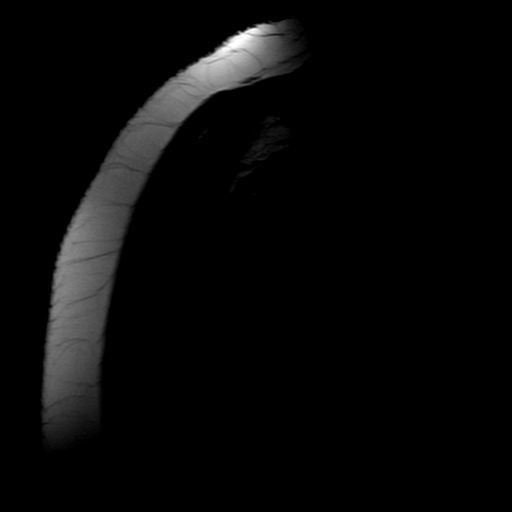
[im 5/18]
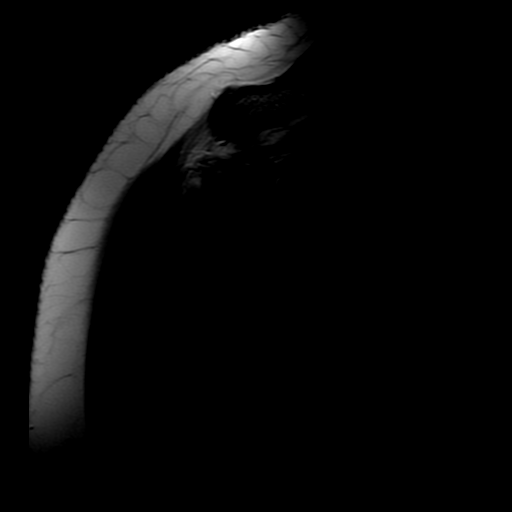
[im 8/18]
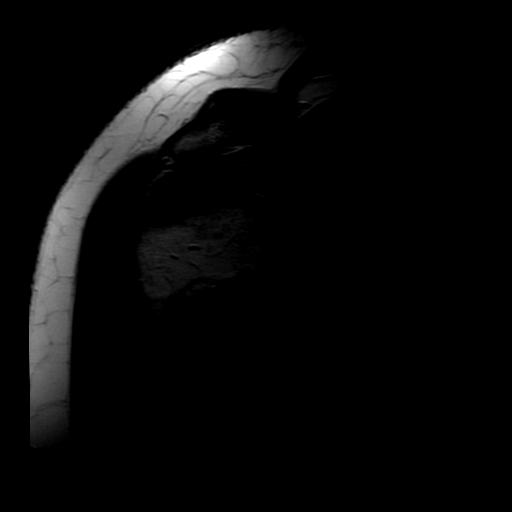
[im 10/18]
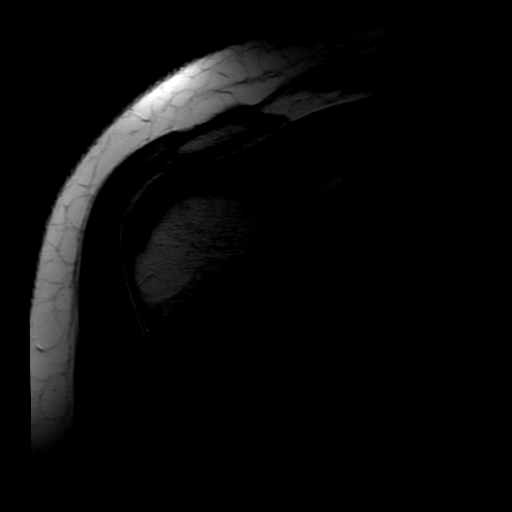
[im 13/18]
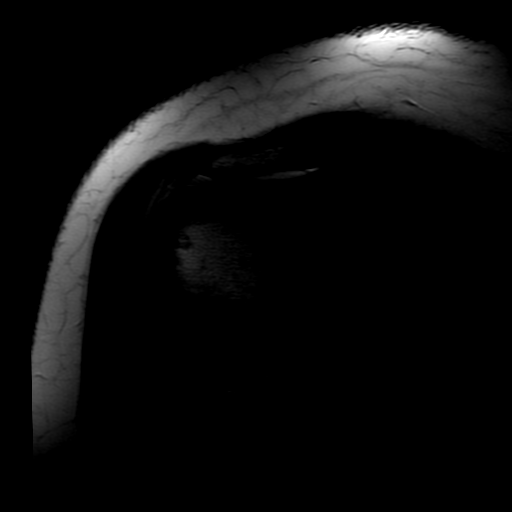
[im 15/18]
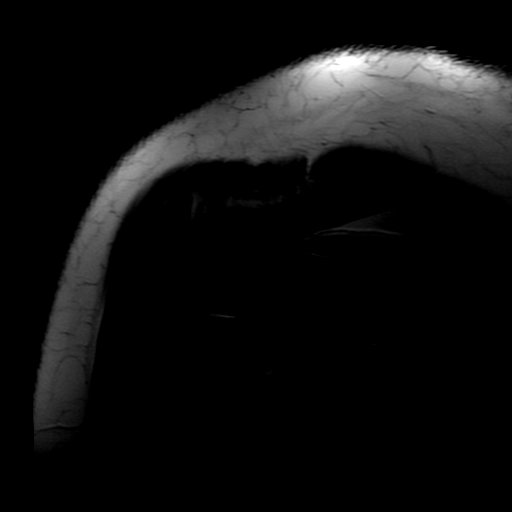
[im 18/18]
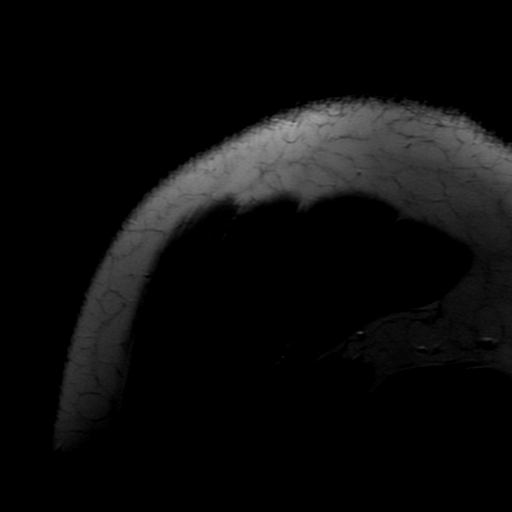

[Series 6: T2 fat-sat · oblique · 4.0mm · 0.27mm/px · 3 of 20 slices shown (3 of 3)]
[im 3/20]
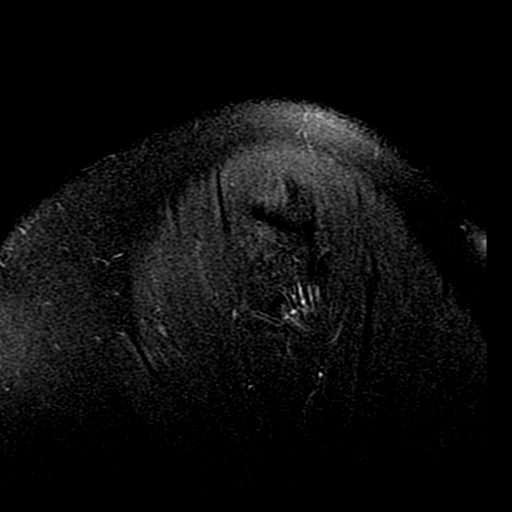
[im 11/20]
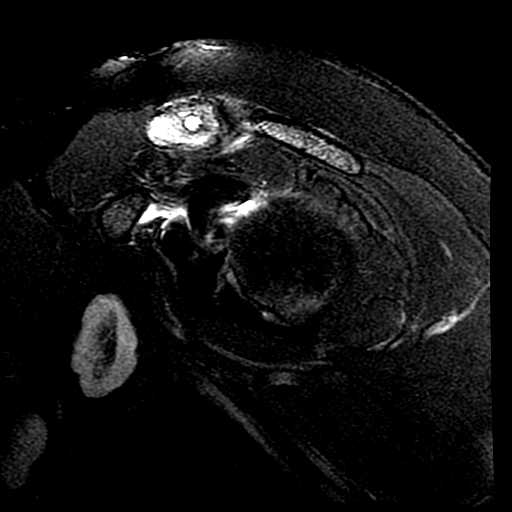
[im 17/20]
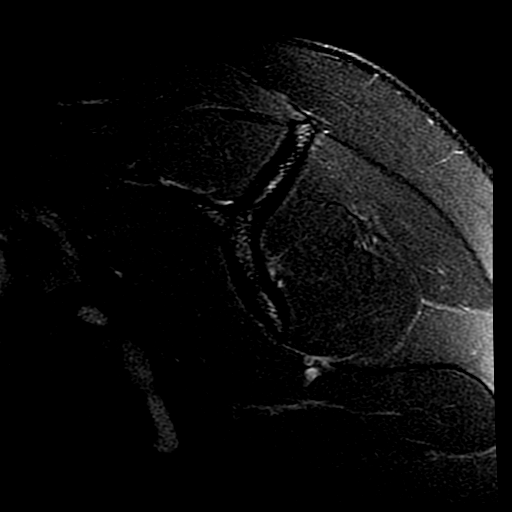

[19 of 40 positions shown; findings below may reference images not displayed]

FINDINGS: A right axillary node measures 1.6 cm in short axis, with a fatty
hilum observed. No parenchymal thickness not including the hilum is
9 mm.  Multiple additional right axillary nodes observed.

Rotator cuff:  Mild subscapularis tendinopathy.
Muscles:  Intact
Biceps long head:  Mild tendinopathy of the intra-articular
segment.

Acromioclavicular Joint:  Moderate degenerative arthropathy with
osseous edema in the distal clavicle and acromion, degenerative
subcortical cystic lesions, and low-level adjacent soft tissue
edema.
Glenohumeral Joint:  Articular cartilage intact.  Humeral head is
posteriorly seated in the glenoid, query capsular laxity.  Anterior
capsular insertion site difficult to ascertain.  Possible capsular
redundancy in the  anterior joint.

Labrum:  No discrete abnormality of the labrum is identified,
although the use of intra-articular contrast can increase
sensitivity in detecting labral tear.
Bones:  AC joint as noted above; otherwise intact.
IMPRESSION: 1.  Degenerative AC joint arthropathy in the with considerable
associated osseous edema.
2.  Mild subscapularis and mild biceps tendinopathy.
3.  Low-level edema in the rotator interval.  This can sometimes be
associated with adhesive capsulitis, correlate with the patients
constellation of symptoms.
4.  Humeral head is posteriorly located in the glenoid, query
anterior capsular laxity.
5.  Scattered right axillary lymph nodes are borderline prominent.

## 2014-02-28 ENCOUNTER — Encounter: Payer: Self-pay | Admitting: Internal Medicine

## 2014-02-28 ENCOUNTER — Ambulatory Visit (INDEPENDENT_AMBULATORY_CARE_PROVIDER_SITE_OTHER): Payer: 59 | Admitting: Internal Medicine

## 2014-02-28 VITALS — BP 120/80 | HR 93 | Temp 97.8°F | Wt 254.0 lb

## 2014-02-28 DIAGNOSIS — R05 Cough: Secondary | ICD-10-CM

## 2014-02-28 DIAGNOSIS — R059 Cough, unspecified: Secondary | ICD-10-CM

## 2014-02-28 MED ORDER — HYDROCOD POLST-CHLORPHEN POLST 10-8 MG/5ML PO LQCR
5.0000 mL | Freq: Two times a day (BID) | ORAL | Status: DC | PRN
Start: 2014-02-28 — End: 2014-03-26

## 2014-02-28 MED ORDER — PREDNISONE 10 MG PO KIT: PACK | ORAL | Status: DC

## 2014-02-28 MED ORDER — FLUCONAZOLE 150 MG PO TABS: 150.0000 mg | ORAL_TABLET | Freq: Once | ORAL | Status: DC

## 2014-02-28 MED ORDER — CLARITHROMYCIN 500 MG PO TABS: 500.0000 mg | ORAL_TABLET | Freq: Two times a day (BID) | ORAL | Status: DC

## 2014-03-01 ENCOUNTER — Ambulatory Visit
Admission: RE | Admit: 2014-03-01 | Discharge: 2014-03-01 | Disposition: A | Discharge status: Home / Self Care | Payer: 59 | Source: Ambulatory Visit | Attending: Internal Medicine | Admitting: Internal Medicine

## 2014-03-01 ENCOUNTER — Other Ambulatory Visit: Payer: Self-pay | Admitting: Internal Medicine

## 2014-03-01 ENCOUNTER — Other Ambulatory Visit: Payer: Self-pay | Admitting: *Deleted

## 2014-03-01 DIAGNOSIS — R05 Cough: Secondary | ICD-10-CM

## 2014-03-01 DIAGNOSIS — R059 Cough, unspecified: Secondary | ICD-10-CM

## 2014-03-01 MED ORDER — PREDNISONE 10 MG PO KIT: PACK | ORAL | Status: DC

## 2014-03-01 NOTE — Telephone Encounter (Signed)
Called in prednisone script

## 2014-03-01 NOTE — Telephone Encounter (Signed)
Resent script for prednisone to patient pharmacy they stated they had not received it

## 2014-03-01 NOTE — Telephone Encounter (Signed)
This was ordered for 12 days yesterday. Please check into it

## 2014-03-12 ENCOUNTER — Other Ambulatory Visit: Payer: Self-pay | Admitting: Internal Medicine

## 2014-03-12 NOTE — Patient Instructions (Signed)
Take Biaxin twice daily for 10 days. Take Tussionex every 12 hours as needed for cough. Diflucan if needed for yeast infection on antibiotics.

## 2014-03-12 NOTE — Progress Notes (Signed)
   Subjective:    Patient ID: Adrian Prince, female    DOB: 17-Nov-1975, 39 y.o.   MRN: [id number]  HPI Patient had respiratory infection with sore throat and cough December 28. She is sick again. She has job as Water quality scientist. Has had cough congestion and fever. Not used to being around pediatric patients that are in. Formerly just works with adults.    Review of Systems     Objective:   Physical Exam  Skin warm and dry. Nodes none. Pharynx slightly injected. TMs slightly full. Neck supple. Chest clear.      Assessment & Plan:  Acute URI  Plan: Biaxin 500 mg twice daily for 10 days. Tussionex 1 teaspoon by mouth every 12 hours when necessary cough. Diflucan to take if yeast infection develops on antibiotic therapy. Rest and drink plenty of fluids. Do not go to work if febrile

## 2014-03-18 ENCOUNTER — Other Ambulatory Visit: Payer: Self-pay | Admitting: Internal Medicine

## 2014-03-21 ENCOUNTER — Other Ambulatory Visit: Payer: 59 | Admitting: Internal Medicine

## 2014-03-21 DIAGNOSIS — Z1329 Encounter for screening for other suspected endocrine disorder: Secondary | ICD-10-CM

## 2014-03-21 DIAGNOSIS — Z0000 Encounter for general adult medical examination without abnormal findings: Secondary | ICD-10-CM

## 2014-03-21 DIAGNOSIS — Z130 Encounter for screening for diseases of the blood and blood-forming organs and certain disorders involving the immune mechanism: Secondary | ICD-10-CM

## 2014-03-21 DIAGNOSIS — E119 Type 2 diabetes mellitus without complications: Secondary | ICD-10-CM

## 2014-03-21 DIAGNOSIS — Z13220 Encounter for screening for lipoid disorders: Secondary | ICD-10-CM

## 2014-03-21 DIAGNOSIS — Z1321 Encounter for screening for nutritional disorder: Secondary | ICD-10-CM

## 2014-03-21 LAB — COMPLETE METABOLIC PANEL WITH GFR
ALBUMIN: 4.3 g/dL (ref 3.5–5.2)
ALK PHOS: 83 U/L (ref 39–117)
ALT: 15 U/L (ref 0–35)
AST: 17 U/L (ref 0–37)
BUN: 13 mg/dL (ref 6–23)
CALCIUM: 9.2 mg/dL (ref 8.4–10.5)
CO2: 25 mEq/L (ref 19–32)
Chloride: 106 mEq/L (ref 96–112)
Creat: 0.97 mg/dL (ref 0.50–1.10)
GFR, EST AFRICAN AMERICAN: 84 mL/min
GFR, EST NON AFRICAN AMERICAN: 73 mL/min
GLUCOSE: 90 mg/dL (ref 70–99)
Potassium: 4.8 mEq/L (ref 3.5–5.3)
Sodium: 141 mEq/L (ref 135–145)
Total Bilirubin: 0.5 mg/dL (ref 0.2–1.2)
Total Protein: 7.2 g/dL (ref 6.0–8.3)

## 2014-03-21 LAB — CBC WITH DIFFERENTIAL/PLATELET
BASOS ABS: 0 10*3/uL (ref 0.0–0.1)
BASOS PCT: 0 % (ref 0–1)
Eosinophils Absolute: 0.1 10*3/uL (ref 0.0–0.7)
Eosinophils Relative: 1 % (ref 0–5)
HCT: 43.5 % (ref 36.0–46.0)
Hemoglobin: 14.4 g/dL (ref 12.0–15.0)
LYMPHS ABS: 1.6 10*3/uL (ref 0.7–4.0)
Lymphocytes Relative: 24 % (ref 12–46)
MCH: 29.1 pg (ref 26.0–34.0)
MCHC: 33.1 g/dL (ref 30.0–36.0)
MCV: 88.1 fL (ref 78.0–100.0)
MONO ABS: 0.6 10*3/uL (ref 0.1–1.0)
MPV: 8.9 fL (ref 8.6–12.4)
Monocytes Relative: 9 % (ref 3–12)
NEUTROS ABS: 4.5 10*3/uL (ref 1.7–7.7)
Neutrophils Relative %: 66 % (ref 43–77)
Platelets: 355 10*3/uL (ref 150–400)
RBC: 4.94 MIL/uL (ref 3.87–5.11)
RDW: 14.5 % (ref 11.5–15.5)
WBC: 6.8 10*3/uL (ref 4.0–10.5)

## 2014-03-21 LAB — LIPID PANEL
CHOLESTEROL: 213 mg/dL — AB (ref 0–200)
HDL: 46 mg/dL (ref >=46)
LDL CALC: 130 mg/dL — AB (ref 0–99)
Total CHOL/HDL Ratio: 4.6 Ratio
Triglycerides: 186 mg/dL — ABNORMAL HIGH (ref <150)
VLDL: 37 mg/dL (ref 0–40)

## 2014-03-21 LAB — HEMOGLOBIN A1C
Hgb A1c MFr Bld: 5.9 % — ABNORMAL HIGH (ref <5.7)
Mean Plasma Glucose: 123 mg/dL — ABNORMAL HIGH (ref <117)

## 2014-03-21 LAB — TSH: TSH: 0.591 u[IU]/mL (ref 0.350–4.500)

## 2014-03-22 ENCOUNTER — Encounter: Payer: 59 | Admitting: Internal Medicine

## 2014-03-22 LAB — VITAMIN D 25 HYDROXY (VIT D DEFICIENCY, FRACTURES): Vit D, 25-Hydroxy: 23 ng/mL — ABNORMAL LOW (ref 30–100)

## 2014-03-26 ENCOUNTER — Ambulatory Visit (INDEPENDENT_AMBULATORY_CARE_PROVIDER_SITE_OTHER): Payer: 59 | Admitting: Internal Medicine

## 2014-03-26 ENCOUNTER — Encounter: Payer: Self-pay | Admitting: Internal Medicine

## 2014-03-26 VITALS — BP 120/80 | HR 83 | Temp 97.8°F | Ht 65.5 in | Wt 258.0 lb

## 2014-03-26 DIAGNOSIS — F329 Major depressive disorder, single episode, unspecified: Secondary | ICD-10-CM

## 2014-03-26 DIAGNOSIS — F439 Reaction to severe stress, unspecified: Secondary | ICD-10-CM

## 2014-03-26 DIAGNOSIS — Z0000 Encounter for general adult medical examination without abnormal findings: Secondary | ICD-10-CM | POA: Diagnosis not present

## 2014-03-26 DIAGNOSIS — I10 Essential (primary) hypertension: Secondary | ICD-10-CM

## 2014-03-26 DIAGNOSIS — E669 Obesity, unspecified: Secondary | ICD-10-CM

## 2014-03-26 DIAGNOSIS — E8881 Metabolic syndrome: Secondary | ICD-10-CM | POA: Diagnosis not present

## 2014-03-26 DIAGNOSIS — Z8669 Personal history of other diseases of the nervous system and sense organs: Secondary | ICD-10-CM

## 2014-03-26 DIAGNOSIS — R7302 Impaired glucose tolerance (oral): Secondary | ICD-10-CM

## 2014-03-26 DIAGNOSIS — Z658 Other specified problems related to psychosocial circumstances: Secondary | ICD-10-CM

## 2014-03-26 DIAGNOSIS — E559 Vitamin D deficiency, unspecified: Secondary | ICD-10-CM

## 2014-03-26 DIAGNOSIS — E785 Hyperlipidemia, unspecified: Secondary | ICD-10-CM

## 2014-03-26 DIAGNOSIS — E119 Type 2 diabetes mellitus without complications: Secondary | ICD-10-CM | POA: Diagnosis not present

## 2014-03-26 DIAGNOSIS — E039 Hypothyroidism, unspecified: Secondary | ICD-10-CM

## 2014-03-26 DIAGNOSIS — F32A Depression, unspecified: Secondary | ICD-10-CM

## 2014-03-27 LAB — MICROALBUMIN / CREATININE URINE RATIO
CREATININE, URINE: 127.5 mg/dL
Microalb Creat Ratio: 2.4 mg/g (ref 0.0–30.0)
Microalb, Ur: 0.3 mg/dL (ref <2.0)

## 2014-04-15 ENCOUNTER — Encounter: Payer: Self-pay | Admitting: Internal Medicine

## 2014-04-30 MED ORDER — ALPRAZOLAM 0.25 MG PO TABS: 0.2500 mg | ORAL_TABLET | Freq: Two times a day (BID) | ORAL | Status: DC | PRN

## 2014-04-30 NOTE — Telephone Encounter (Signed)
Anxiety regarding job stress causing some epigastric pain. Wants Xanax . Rx Xanax 0.25 mg #60 0ne po bid no refill

## 2014-06-08 NOTE — Progress Notes (Signed)
Subjective:    Patient ID: Dana Washington, female    DOB: Jan 08, 1976, 39 y.o.   MRN: [id number]  HPI  39 year old White Female in today for health maintenance exam and evaluation of medical issues. Mother lives with her now. That seems to be working out well. Continues to have stress at work. 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 Is on call all the time and this is very stressful. She has a history of hypertension, hyperlipidemia, obesity, migraine headaches, hypothyroidism, impaired glucose tolerance, metabolic syndrome.  First presented to this office December 2012 having moved from [place]. Was started on Pravachol while in [place] but discontinued it. Apparently hyperlipidemia is inherited from her father. Depression treated with Cymbalta.  Social history: Single, never married, no children. Sister lives here as well. Social alcohol consumption. Nonsmoker. Enjoys dogs.  Family history: Father with history of prostate cancer and hyperlipidemia. Mother with history of skin cancer. Sister with history of migraine headaches. Patient says there's a family history of early onset breast cancer.  Past medical history: Breast reduction surgery May 2010. Subsequently had bilateral carpal tunnel release February 2012. Lasix surgery 2008 for myopia. History of Hashimoto's thyroiditis with resultant hypothyroidism. History of vitamin D deficiency. Benign lymph node biopsy February 2010. History of seroma left breast. Shoulder surgery by Dr. Theda Sers September 2014. Had a 20% rotator cuff tear and osteoarthritis of the AC joint. Had partial clavicle resection. She had a glenoid labral tear.      Review of Systems a lot of situational stress with job at the present time. Says she's unable to make any changes in the foreseeable future. They have indicated they will be getting some help for her in the near future. Feel she has no free time for herself. Spent most of the time  talking about the situational stress.     Objective:   Physical Exam  Constitutional: She appears well-developed and well-nourished. No distress.  HENT:  Head: Normocephalic and atraumatic.  Right Ear: External ear normal.  Left Ear: External ear normal.  Mouth/Throat: No oropharyngeal exudate.  Eyes: Conjunctivae and EOM are normal. Pupils are equal, round, and reactive to light. Right eye exhibits no discharge. Left eye exhibits no discharge. No scleral icterus.  Neck: Neck supple. No JVD present. No thyromegaly present.  Cardiovascular: Normal rate, regular rhythm, normal heart sounds and intact distal pulses.   No murmur heard. Pulmonary/Chest: Effort normal and breath sounds normal. No respiratory distress. She has no wheezes. She has no rales. She exhibits no tenderness.  Breast without masses  Abdominal: Soft. Bowel sounds are normal. She exhibits no distension and no mass. There is no tenderness. There is no rebound and no guarding.  Genitourinary:  Deferred to GYN  Musculoskeletal: She exhibits no edema.  Lymphadenopathy:    She has no cervical adenopathy.  Neurological: She is alert. She has normal reflexes. No cranial nerve deficit. Coordination normal.  Skin: Skin is warm and dry. No rash noted.  Psychiatric: She has a normal mood and affect. Her behavior is normal. Thought content normal.  Anxious  Vitals reviewed.         Assessment & Plan:  Obesity  Metabolic syndrome  Impaired glucose tolerance  Hypothyroidism status post Hashimoto's thyroiditis  Essential hypertension  Hyperlipidemia -has elevated triglycerides and LDL cholesterol. Needs to work on diet and exercise but is under so much stress at work and this is difficult at present time   History of depression-treated with Cymbalta  Migraine headaches  Situational stress  Vitamin D deficiency-take 2000 units vitamin D 3 daily  Plan: Encouraged patient to get some time off. May need counseling  to deal with situational stress but she's not willing to do that at the present time. Continue same medications and return in 6 months. Try to work on diet and exercise.

## 2014-06-08 NOTE — Patient Instructions (Signed)
Continue same medications and return in 6 months. Consider counseling for situational stress. Try to get some time off work.

## 2014-06-26 ENCOUNTER — Other Ambulatory Visit: Payer: Self-pay | Admitting: Internal Medicine

## 2014-08-09 ENCOUNTER — Other Ambulatory Visit: Payer: Self-pay | Admitting: Internal Medicine

## 2014-09-02 ENCOUNTER — Other Ambulatory Visit: Payer: Self-pay | Admitting: Internal Medicine

## 2014-09-02 NOTE — Telephone Encounter (Signed)
lipitor refilled

## 2014-09-10 ENCOUNTER — Other Ambulatory Visit: Payer: Self-pay | Admitting: Internal Medicine

## 2014-09-17 ENCOUNTER — Other Ambulatory Visit: Payer: 59 | Admitting: Internal Medicine

## 2014-09-17 DIAGNOSIS — Z79899 Other long term (current) drug therapy: Secondary | ICD-10-CM

## 2014-09-17 DIAGNOSIS — E119 Type 2 diabetes mellitus without complications: Secondary | ICD-10-CM

## 2014-09-17 DIAGNOSIS — E785 Hyperlipidemia, unspecified: Secondary | ICD-10-CM

## 2014-09-17 DIAGNOSIS — E039 Hypothyroidism, unspecified: Secondary | ICD-10-CM

## 2014-09-17 LAB — LIPID PANEL
CHOLESTEROL: 188 mg/dL (ref 125–200)
HDL: 44 mg/dL — ABNORMAL LOW (ref >=46)
LDL Cholesterol: 105 mg/dL (ref <130)
Total CHOL/HDL Ratio: 4.3 Ratio (ref <=5.0)
Triglycerides: 195 mg/dL — ABNORMAL HIGH (ref <150)
VLDL: 39 mg/dL — AB (ref <30)

## 2014-09-17 LAB — HEPATIC FUNCTION PANEL
ALT: 10 U/L (ref 6–29)
AST: 14 U/L (ref 10–30)
Albumin: 4.1 g/dL (ref 3.6–5.1)
Alkaline Phosphatase: 86 U/L (ref 33–115)
BILIRUBIN DIRECT: 0.1 mg/dL (ref <=0.2)
BILIRUBIN TOTAL: 0.3 mg/dL (ref 0.2–1.2)
Indirect Bilirubin: 0.2 mg/dL (ref 0.2–1.2)
Total Protein: 7.5 g/dL (ref 6.1–8.1)

## 2014-09-17 LAB — TSH: TSH: 1.51 u[IU]/mL (ref 0.350–4.500)

## 2014-09-17 LAB — HEMOGLOBIN A1C
Hgb A1c MFr Bld: 5.9 % — ABNORMAL HIGH (ref <5.7)
Mean Plasma Glucose: 123 mg/dL — ABNORMAL HIGH (ref <117)

## 2014-09-19 ENCOUNTER — Ambulatory Visit (INDEPENDENT_AMBULATORY_CARE_PROVIDER_SITE_OTHER): Payer: 59 | Admitting: Internal Medicine

## 2014-09-19 ENCOUNTER — Encounter: Payer: Self-pay | Admitting: Internal Medicine

## 2014-09-19 VITALS — BP 112/74 | HR 75 | Temp 97.9°F | Ht 66.0 in | Wt 271.0 lb

## 2014-09-19 DIAGNOSIS — E8881 Metabolic syndrome: Secondary | ICD-10-CM | POA: Diagnosis not present

## 2014-09-19 DIAGNOSIS — F419 Anxiety disorder, unspecified: Secondary | ICD-10-CM

## 2014-09-19 DIAGNOSIS — E669 Obesity, unspecified: Secondary | ICD-10-CM | POA: Diagnosis not present

## 2014-09-19 DIAGNOSIS — F439 Reaction to severe stress, unspecified: Secondary | ICD-10-CM

## 2014-09-19 DIAGNOSIS — E039 Hypothyroidism, unspecified: Secondary | ICD-10-CM

## 2014-09-19 DIAGNOSIS — I10 Essential (primary) hypertension: Secondary | ICD-10-CM | POA: Diagnosis not present

## 2014-09-19 DIAGNOSIS — F329 Major depressive disorder, single episode, unspecified: Secondary | ICD-10-CM

## 2014-09-19 DIAGNOSIS — E785 Hyperlipidemia, unspecified: Secondary | ICD-10-CM

## 2014-09-19 DIAGNOSIS — E559 Vitamin D deficiency, unspecified: Secondary | ICD-10-CM

## 2014-09-19 DIAGNOSIS — Z8669 Personal history of other diseases of the nervous system and sense organs: Secondary | ICD-10-CM

## 2014-09-19 DIAGNOSIS — F418 Other specified anxiety disorders: Secondary | ICD-10-CM

## 2014-09-19 DIAGNOSIS — Z658 Other specified problems related to psychosocial circumstances: Secondary | ICD-10-CM

## 2014-09-19 DIAGNOSIS — F32A Depression, unspecified: Secondary | ICD-10-CM

## 2014-10-11 NOTE — Progress Notes (Signed)
   Subjective:    Patient ID: Dana Washington, female    DOB: 11-16-1975, 39 y.o.   MRN: [id number]  HPI In today to follow-up on multiple medical issues include obesity, essential hypertension, type 2 diabetes mellitus,hyperlipidemia, metabolic syndrome, depression, history of migraine headaches, hypothyroidism, vitamin D deficiency, situational stress. Work stresse has not gotten any better despite complaining to Librarian, academic. Needs to have additional pediatric sonographer to help give her some relief. Is on call all the time.  Not really able to diet and exercise. She weighs 271 pounds. Previously weighed 258 pounds in March 2016. Has gained 13 pounds.  History of Hashimoto's thyroiditis resulting in hypothyroidism. Patient is compliant with medications. Is on Lipitor, Synthroid, Prozac, Xanax, Fioricet, metformin.  To receive influenza immunization at work.    Review of Systems     Objective:   Physical Exam Skin warm and dry. Nodes none. Neck is supple without JVD thyromegaly or carotid bruits. Chest clear to auscultation. Cardiac exam regular rate and rhythm normal S1 and S2. Extremities without edema       Assessment & Plan:  Essential hypertension  Type 2 diabetes mellitus  Obesity  Vitamin D deficiency  History of migraine headaches  Hypothyroidism  Situational stress  Hyperlipidemia  Plan: Patient is going to have to learn to advocate for herself and stopped taking on too much work. This is not healthy for her. She's actually gained 13 pounds. She needs to take time to look after herself needs diet and exercise.  Patient encouraged to make some white style changes. Return in 6 months.  25 minutes spent with patient today   Plan: In March LDL cholesterol was 1:30 and is now 105. Triglycerides were 186 and now are 195. Total cholesterol was 213 and is now 188. In March vitamin D level was 23. TSH is within normal limits.

## 2014-10-11 NOTE — Patient Instructions (Signed)
Please take some time for yourself and advocate for some additional help at work. Continue to work on diet exercise and weight loss. Continue same medications. Return in 6 months.

## 2014-10-17 ENCOUNTER — Other Ambulatory Visit: Payer: Self-pay

## 2014-10-17 DIAGNOSIS — Z1231 Encounter for screening mammogram for malignant neoplasm of breast: Secondary | ICD-10-CM

## 2014-10-29 ENCOUNTER — Ambulatory Visit
Admission: RE | Admit: 2014-10-29 | Discharge: 2014-10-29 | Disposition: A | Discharge status: Home / Self Care | Payer: 59 | Source: Ambulatory Visit

## 2014-10-29 DIAGNOSIS — Z1231 Encounter for screening mammogram for malignant neoplasm of breast: Secondary | ICD-10-CM

## 2014-12-02 ENCOUNTER — Other Ambulatory Visit: Payer: Self-pay | Admitting: Internal Medicine

## 2014-12-16 ENCOUNTER — Other Ambulatory Visit: Payer: Self-pay | Admitting: Internal Medicine

## 2015-01-07 ENCOUNTER — Other Ambulatory Visit: Payer: Self-pay

## 2015-01-07 NOTE — Telephone Encounter (Signed)
Give #30 with no refill

## 2015-01-07 NOTE — Telephone Encounter (Signed)
Patient requested 

## 2015-01-08 MED ORDER — BUTALBITAL-APAP-CAFFEINE 50-325-40 MG PO TABS: 1.0000 | ORAL_TABLET | Freq: Two times a day (BID) | ORAL | Status: DC | PRN

## 2015-01-22 ENCOUNTER — Other Ambulatory Visit: Payer: Self-pay | Admitting: Internal Medicine

## 2015-01-22 MED FILL — TOPIRAMATE 100 MG TABLET: 100 | 90 days supply | Qty: 90 | Fill #0

## 2015-02-03 ENCOUNTER — Other Ambulatory Visit: Payer: Self-pay | Admitting: Internal Medicine

## 2015-02-03 MED FILL — FLUoxetine HCL 20 MG CAPS: 20 | 90 days supply | Qty: 90 | Fill #0

## 2015-02-17 MED FILL — LARIN FE 1.5-30 TABLET: 1.5-30 | 84 days supply | Qty: 84 | Fill #0

## 2015-02-24 DIAGNOSIS — D485 Neoplasm of uncertain behavior of skin: Secondary | ICD-10-CM | POA: Diagnosis not present

## 2015-02-24 DIAGNOSIS — D2362 Other benign neoplasm of skin of left upper limb, including shoulder: Secondary | ICD-10-CM | POA: Diagnosis not present

## 2015-03-03 ENCOUNTER — Other Ambulatory Visit: Payer: Self-pay | Admitting: Internal Medicine

## 2015-03-03 MED FILL — ESOMEPRAZOLE MAG DR 40 MG C: 40 | 90 days supply | Qty: 90 | Fill #0

## 2015-03-03 MED FILL — ATORVASTATIN 10 MG TABLET: 10 | 90 days supply | Qty: 90 | Fill #1

## 2015-03-24 ENCOUNTER — Other Ambulatory Visit: Payer: Self-pay | Admitting: Internal Medicine

## 2015-03-24 MED FILL — SYNTHROID 100 MCG TABLET: 100 | 90 days supply | Qty: 90 | Fill #0

## 2015-03-24 MED FILL — BENAZEPRIL-HCTZ 20-12.5 MG: 20-12.5 | 90 days supply | Qty: 90 | Fill #0

## 2015-03-25 ENCOUNTER — Other Ambulatory Visit: Payer: Self-pay | Admitting: Internal Medicine

## 2015-03-25 ENCOUNTER — Other Ambulatory Visit: Payer: 59 | Admitting: Internal Medicine

## 2015-03-25 DIAGNOSIS — Z0000 Encounter for general adult medical examination without abnormal findings: Secondary | ICD-10-CM

## 2015-03-25 DIAGNOSIS — E559 Vitamin D deficiency, unspecified: Secondary | ICD-10-CM | POA: Diagnosis not present

## 2015-03-25 DIAGNOSIS — E119 Type 2 diabetes mellitus without complications: Secondary | ICD-10-CM | POA: Diagnosis not present

## 2015-03-25 DIAGNOSIS — E8881 Metabolic syndrome: Secondary | ICD-10-CM | POA: Diagnosis not present

## 2015-03-25 DIAGNOSIS — R7302 Impaired glucose tolerance (oral): Secondary | ICD-10-CM | POA: Diagnosis not present

## 2015-03-25 DIAGNOSIS — I10 Essential (primary) hypertension: Secondary | ICD-10-CM

## 2015-03-25 DIAGNOSIS — E785 Hyperlipidemia, unspecified: Secondary | ICD-10-CM

## 2015-03-25 DIAGNOSIS — E039 Hypothyroidism, unspecified: Secondary | ICD-10-CM

## 2015-03-25 LAB — COMPREHENSIVE METABOLIC PANEL
ALK PHOS: 91 U/L (ref 33–115)
ALT: 16 U/L (ref 6–29)
AST: 18 U/L (ref 10–30)
Albumin: 4.3 g/dL (ref 3.6–5.1)
BUN: 13 mg/dL (ref 7–25)
CO2: 24 mmol/L (ref 20–31)
CREATININE: 0.94 mg/dL (ref 0.50–1.10)
Calcium: 10 mg/dL (ref 8.6–10.2)
Chloride: 103 mmol/L (ref 98–110)
Glucose, Bld: 103 mg/dL — ABNORMAL HIGH (ref 65–99)
Potassium: 4.6 mmol/L (ref 3.5–5.3)
SODIUM: 140 mmol/L (ref 135–146)
TOTAL PROTEIN: 7.6 g/dL (ref 6.1–8.1)
Total Bilirubin: 0.3 mg/dL (ref 0.2–1.2)

## 2015-03-25 NOTE — Addendum Note (Signed)
Addended by: Milta Deiters on: 03/25/2015 10:32 AM   Modules accepted: Orders

## 2015-03-26 ENCOUNTER — Telehealth: Payer: Self-pay

## 2015-03-26 LAB — TSH: TSH: 1.35 mIU/L

## 2015-03-26 LAB — CBC WITH DIFFERENTIAL/PLATELET
BASOS ABS: 0.1 10*3/uL (ref 0.0–0.1)
Basophils Relative: 1 % (ref 0–1)
EOS ABS: 0.1 10*3/uL (ref 0.0–0.7)
EOS PCT: 1 % (ref 0–5)
HCT: 44.4 % (ref 36.0–46.0)
Hemoglobin: 14.5 g/dL (ref 12.0–15.0)
LYMPHS PCT: 22 % (ref 12–46)
Lymphs Abs: 1.8 10*3/uL (ref 0.7–4.0)
MCH: 28.8 pg (ref 26.0–34.0)
MCHC: 32.7 g/dL (ref 30.0–36.0)
MCV: 88.3 fL (ref 78.0–100.0)
MPV: 9.3 fL (ref 8.6–12.4)
Monocytes Absolute: 0.6 10*3/uL (ref 0.1–1.0)
Monocytes Relative: 7 % (ref 3–12)
NEUTROS PCT: 69 % (ref 43–77)
Neutro Abs: 5.7 10*3/uL (ref 1.7–7.7)
PLATELETS: 355 10*3/uL (ref 150–400)
RBC: 5.03 MIL/uL (ref 3.87–5.11)
RDW: 15.1 % (ref 11.5–15.5)
WBC: 8.3 10*3/uL (ref 4.0–10.5)

## 2015-03-26 LAB — LIPID PANEL
CHOL/HDL RATIO: 3.6 ratio (ref <=5.0)
Cholesterol: 183 mg/dL (ref 125–200)
HDL: 51 mg/dL (ref >=46)
LDL Cholesterol: 92 mg/dL (ref <130)
TRIGLYCERIDES: 202 mg/dL — AB (ref <150)
VLDL: 40 mg/dL — ABNORMAL HIGH (ref <30)

## 2015-03-26 LAB — HEMOGLOBIN A1C
Hgb A1c MFr Bld: 5.9 % — ABNORMAL HIGH (ref <5.7)
Mean Plasma Glucose: 123 mg/dL — ABNORMAL HIGH (ref <117)

## 2015-03-26 LAB — VITAMIN D 25 HYDROXY (VIT D DEFICIENCY, FRACTURES): VIT D 25 HYDROXY: 17 ng/mL — AB (ref 30–100)

## 2015-03-27 NOTE — Telephone Encounter (Signed)
Entered in error

## 2015-03-28 ENCOUNTER — Encounter: Payer: Self-pay | Admitting: Internal Medicine

## 2015-03-28 ENCOUNTER — Ambulatory Visit (INDEPENDENT_AMBULATORY_CARE_PROVIDER_SITE_OTHER): Payer: 59 | Admitting: Internal Medicine

## 2015-03-28 VITALS — BP 118/80 | HR 83 | Temp 98.0°F | Ht 64.0 in | Wt 269.0 lb

## 2015-03-28 DIAGNOSIS — E8881 Metabolic syndrome: Secondary | ICD-10-CM | POA: Diagnosis not present

## 2015-03-28 DIAGNOSIS — Z0000 Encounter for general adult medical examination without abnormal findings: Secondary | ICD-10-CM

## 2015-03-28 DIAGNOSIS — R7302 Impaired glucose tolerance (oral): Secondary | ICD-10-CM | POA: Diagnosis not present

## 2015-03-28 DIAGNOSIS — E039 Hypothyroidism, unspecified: Secondary | ICD-10-CM

## 2015-03-28 DIAGNOSIS — E559 Vitamin D deficiency, unspecified: Secondary | ICD-10-CM

## 2015-03-28 DIAGNOSIS — Z8669 Personal history of other diseases of the nervous system and sense organs: Secondary | ICD-10-CM | POA: Diagnosis not present

## 2015-03-28 DIAGNOSIS — E669 Obesity, unspecified: Secondary | ICD-10-CM | POA: Diagnosis not present

## 2015-03-28 DIAGNOSIS — E785 Hyperlipidemia, unspecified: Secondary | ICD-10-CM | POA: Diagnosis not present

## 2015-03-28 DIAGNOSIS — I10 Essential (primary) hypertension: Secondary | ICD-10-CM | POA: Diagnosis not present

## 2015-03-28 DIAGNOSIS — Z9013 Acquired absence of bilateral breasts and nipples: Secondary | ICD-10-CM | POA: Diagnosis not present

## 2015-03-28 DIAGNOSIS — Z9889 Other specified postprocedural states: Secondary | ICD-10-CM

## 2015-03-28 MED ORDER — METFORMIN HCL 500 MG PO TABS: ORAL_TABLET | ORAL | Status: DC

## 2015-03-28 MED ORDER — FLUOXETINE HCL 20 MG PO CAPS: 20.0000 mg | ORAL_CAPSULE | Freq: Every day | ORAL | Status: DC

## 2015-03-28 MED ORDER — ATORVASTATIN CALCIUM 20 MG PO TABS: 20.0000 mg | ORAL_TABLET | Freq: Every day | ORAL | Status: DC

## 2015-03-28 MED ORDER — ESOMEPRAZOLE MAGNESIUM 40 MG PO CPDR: 40.0000 mg | DELAYED_RELEASE_CAPSULE | Freq: Every day | ORAL | Status: DC

## 2015-03-28 MED ORDER — ERGOCALCIFEROL 1.25 MG (50000 UT) PO CAPS: 50000.0000 [IU] | ORAL_CAPSULE | ORAL | Status: DC

## 2015-03-28 MED FILL — VIT D2 1.25 MG (50,000 UNIT: 1.25 MG | 84 days supply | Qty: 12 | Fill #0

## 2015-03-28 MED FILL — metFORMIN HCL 500 MG TABS: 500 | 90 days supply | Qty: 180 | Fill #0

## 2015-03-28 MED FILL — ATORVASTATIN 20 MG TABLET: 20 | 90 days supply | Qty: 90 | Fill #0

## 2015-04-11 DIAGNOSIS — E559 Vitamin D deficiency, unspecified: Secondary | ICD-10-CM | POA: Insufficient documentation

## 2015-04-11 NOTE — Progress Notes (Signed)
Subjective:    Patient ID: Dana Washington, female    DOB: 05-30-1975, 40 y.o.   MRN: [id number]  HPI 40 year old Female in today for health maintenance exam and evaluation of medical issues.She has a history of essential hypertension, hyperlipidemia, obesity, metabolic syndrome, NP glucose tolerance, migraine headaches and hypothyroidism.  She first presented this office December 2012 having moved from [place]. She was started on Pravachol while in [place] but discontinued it. Apparently hyperlipidemia is inherited from her father. She has a history of depression treated with Cymbalta.  Social history: Single, never married, no children. Sister lives here. Mother has moved here and is living with her for now. This is worked out very well. 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 She is getting a bit more time off but still working quite hard. Nonsmoker. Social alcohol consumption. Enjoys dogs.  Family history: Father with history of prostate cancer and hyperlipidemia. Mother with history of skin cancer. Sister with history of migraine headaches. Patient says is a family history of early onset breast cancer.  Past medical history: Patient had breast reduction surgery May 2010. Subsequently had bilateral carpal tunnel release February 2012. Lasik surgery 2008 for myopia. History of Hashimoto's thyroiditis with resultant hypothyroidism. History of vitamin D deficiency. Benign lymph node biopsy February 2010. History of seroma left breast which was a result of her breast reduction surgery. Shoulder surgery by Dr. Theda Sers September 2014. Had a 20% rotator cuff tear and osteoarthritis of the before meals joint. Had partial clavicle resection. She had a glenoid labral tear.    Review of Systems  Constitutional: Negative.   All other systems reviewed and are negative.      Objective:   Physical Exam  Constitutional: She is oriented to person, place, and time. She appears  well-developed and well-nourished. No distress.  HENT:  Head: Normocephalic and atraumatic.  Right Ear: External ear normal.  Left Ear: External ear normal.  Mouth/Throat: Oropharynx is clear and moist. No oropharyngeal exudate.  Eyes: Conjunctivae are normal. Pupils are equal, round, and reactive to light. Right eye exhibits no discharge. Left eye exhibits no discharge. No scleral icterus.  Neck: Neck supple. No JVD present. No thyromegaly present.  Cardiovascular: Normal rate, regular rhythm, normal heart sounds and intact distal pulses.   No murmur heard. Pulmonary/Chest: Effort normal and breath sounds normal. No respiratory distress. She has no wheezes. She has no rales. She exhibits no tenderness.  Seroma left breast. Otherwise no masses.  Abdominal: Soft. Bowel sounds are normal. She exhibits no distension and no mass. There is no tenderness. There is no rebound and no guarding.  Genitourinary:  Deferred to GYN  Musculoskeletal: She exhibits no edema.  Lymphadenopathy:    She has no cervical adenopathy.  Neurological: She is alert and oriented to person, place, and time. She has normal reflexes. No cranial nerve deficit. Coordination normal.  Skin: Skin is warm and dry. No rash noted. She is not diaphoretic.  Psychiatric: She has a normal mood and affect. Her behavior is normal. Judgment and thought content normal.  Vitals reviewed.         Assessment & Plan:  Impaired glucose tolerance  History of migraine headaches  Hypertension  Hyperlipidemia  Hypothyroidism  Metabolic syndrome  Stressful job  Obesity  Vitamin D deficiency-level is 17. Take Drisdol 50,000 Units weekly 12 weeks in 2000 units D3 daily.  History of breast reduction surgery  Plan: Fasting glucose is 103 with hemoglobin A1c 5.9%. She has  glucose intolerance under fairly good control. I think it would go away if she was able to lose some weight. Lipid panel is normal. Liver functions are normal.  Vitamin D level is low at 17. Needs supplementation. Encouraged diet exercise and weight loss and return in 6 months.

## 2015-04-11 NOTE — Patient Instructions (Signed)
Take 50,000 units Drisdol weekly for 12 weeks then 2000 units vitamin D 3 daily. Continue other medications as previously prescribed. Return in 6 months. Recommend diet exercise and weight loss. It was a pleasure to see you today.

## 2015-04-14 MED FILL — TOPIRAMATE 100 MG TABLET: 100 | 90 days supply | Qty: 90 | Fill #1

## 2015-05-05 MED FILL — LARIN FE 1.5-30 TABLET: 1.5-30 | 84 days supply | Qty: 84 | Fill #1

## 2015-05-05 MED FILL — FLUoxetine HCL 20 MG CAPS: 20 | 90 days supply | Qty: 90 | Fill #0

## 2015-05-12 IMAGING — US US BREAST*R* LIMITED INC AXILLA
1 series · 12 of 12 positions shown · non-contrast
Comparison: Previous examinations at [facility] dated
09/18/2009 and 01/11/2008.

CLINICAL DATA: Mass felt by the patient in the upper inner right
breast. Status post bilateral breast reduction in 5474.

EXAM:
DIGITAL DIAGNOSTIC  BILATERAL MAMMOGRAM WITH CAD
ULTRASOUND RIGHT BREAST

[Series 1: us breast*right* limited inc axilla · 12 of 12 slices shown]
[im 1/12]
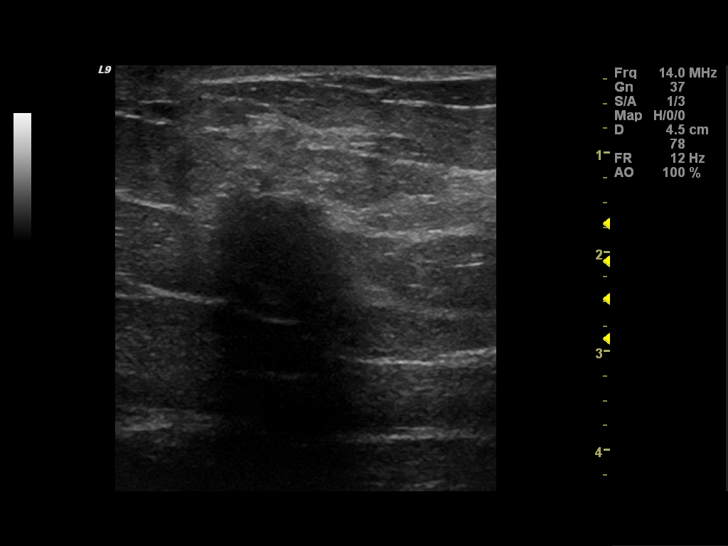
[im 2/12]
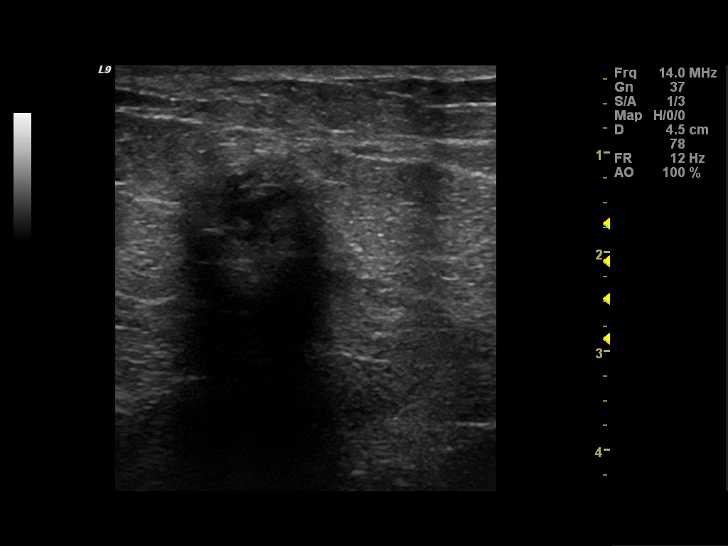
[im 3/12]
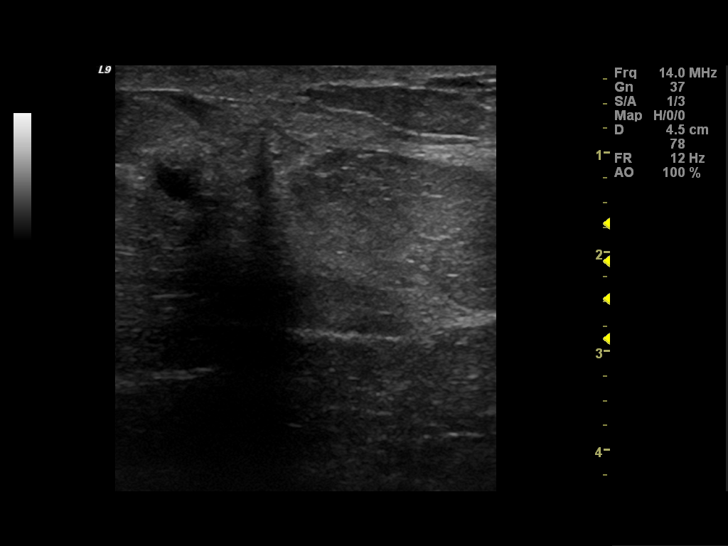
[im 4/12]
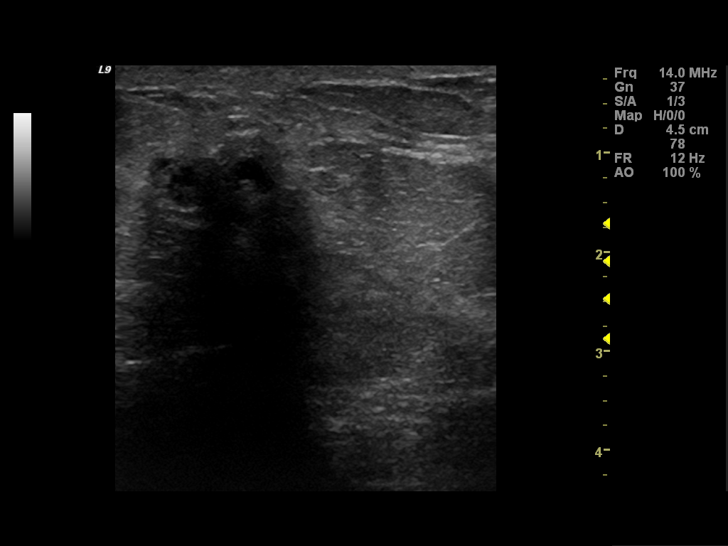
[im 5/12]
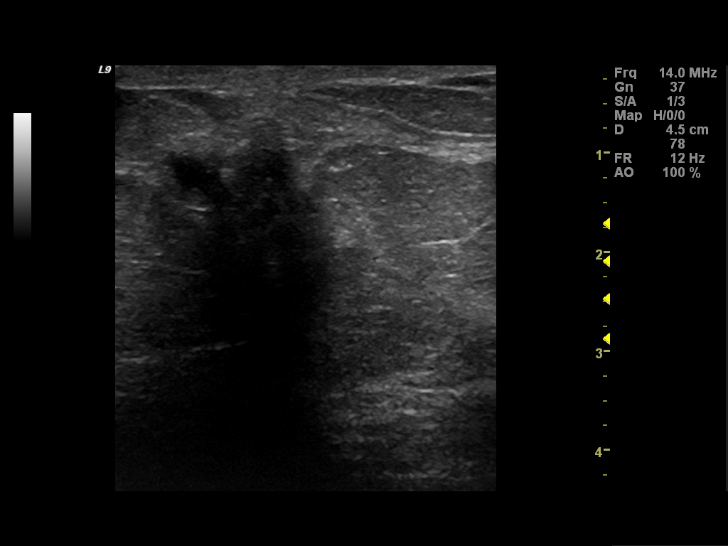
[im 6/12]
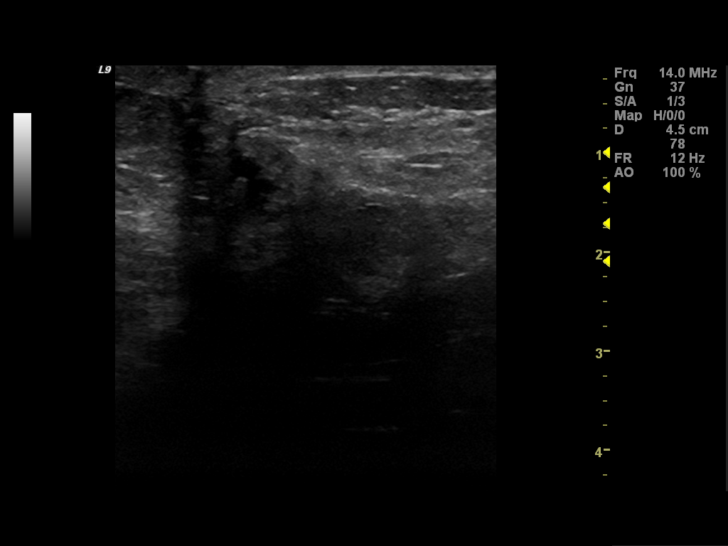
[im 7/12]
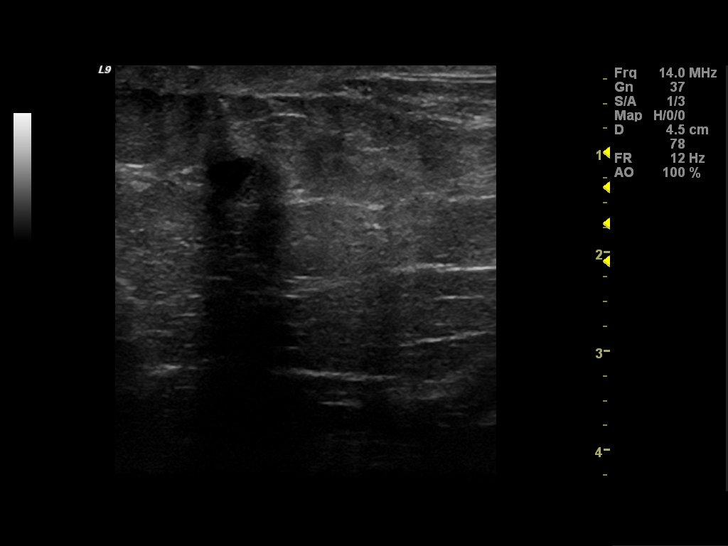
[im 8/12]
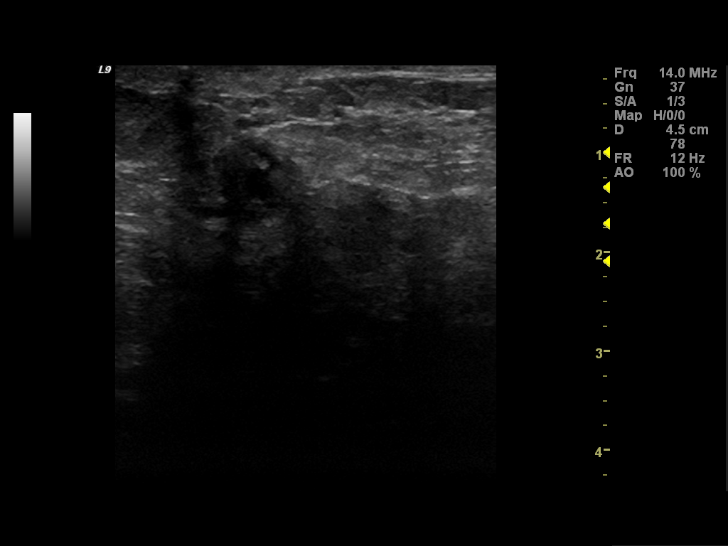
[im 9/12]
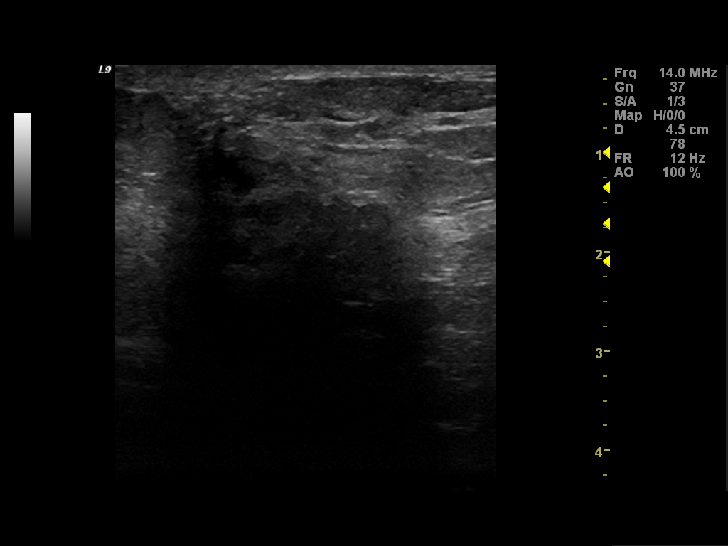
[im 10/12]
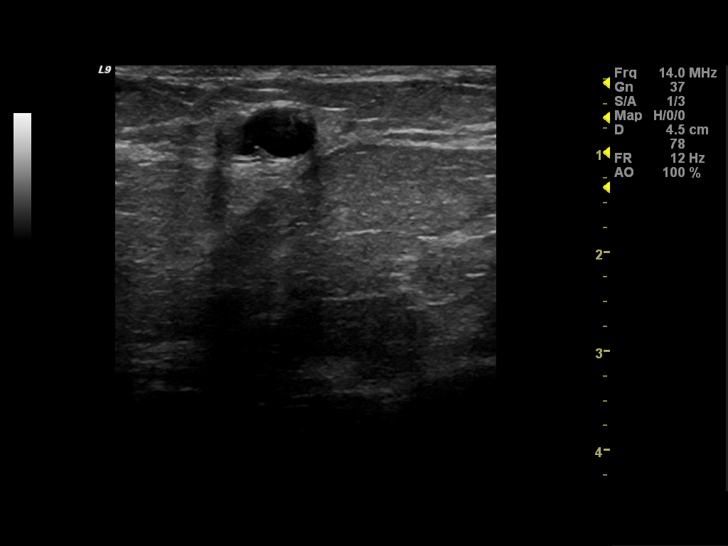
[im 11/12]
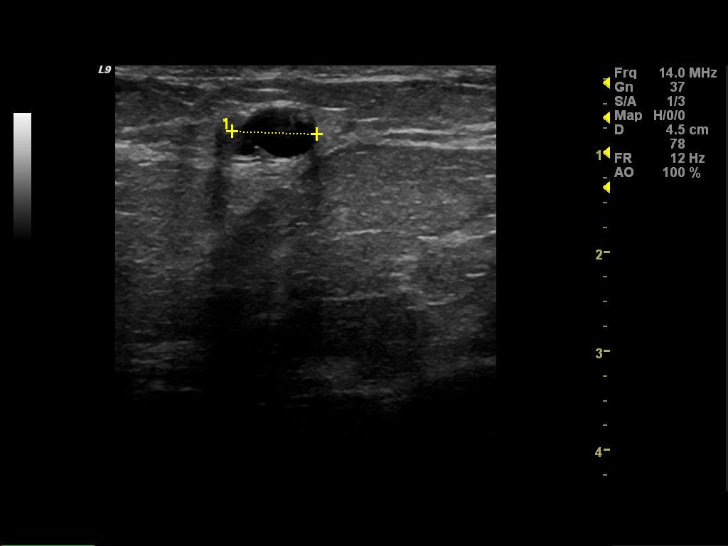
[im 12/12]
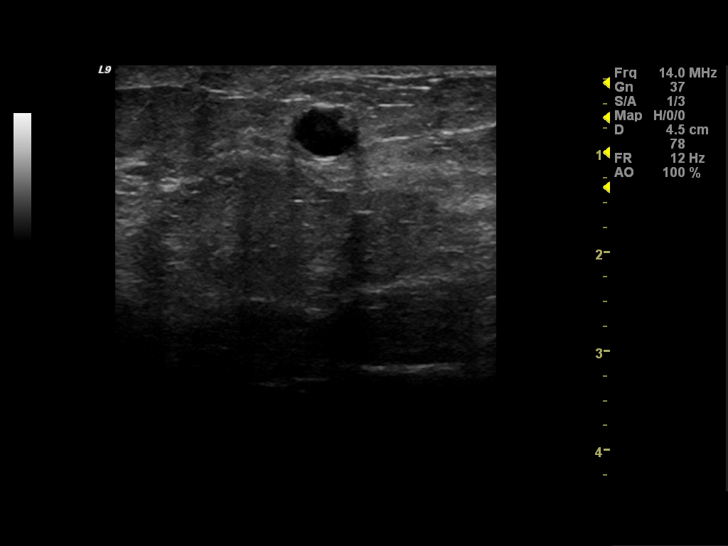

[12 of 12 positions shown; findings below may reference images not displayed]

ACR Breast Density Category b: There are scattered areas of
fibroglandular density.
FINDINGS: Bilateral postreduction changes with areas of bilateral fat necrosis
and oil cyst formation. This includes an area of fat necrosis and
oil cyst formation at the location of the mass felt by the patient
in the upper inner right breast, marked with a metallic marker. On
the initial craniocaudal view of the right breast, there was also an
irregular density deep in the medial aspect of the breast. This has
an appearance of normal fibroglandular tissue and Cooper's ligaments
on the rolled craniocaudal views, unchanged compared to the previous
examinations.

Mammographic images were processed with CAD.

On physical exam, the patient has an approximately 1.5 cm rounded
palpable mass like area in the upper inner right breast at the
location of patient concern. No mass is palpable elsewhere in the
medial right breast.

Ultrasound is performed, showing areas of fat necrosis and oil cyst
formation with developing calcification and posterior acoustical
shadowing and posterior acoustical enhancement in the 2 o'clock
position of the right breast, 2 cm from the nipple, at the area of
palpable concern. No abnormality elsewhere in the medial right
breast.
IMPRESSION: Bilateral postreduction fat necrosis, including the area of patient
concern on the right. No evidence of malignancy.

RECOMMENDATION:
Bilateral screening mammogram in 1 year.

I have discussed the findings and recommendations with the patient.
Results were also provided in writing at the conclusion of the
visit. If applicable, a reminder letter will be sent to the patient
regarding the next appointment.

BI-RADS CATEGORY  2: Benign.

## 2015-05-12 IMAGING — MG MM DIGITAL DIAGNOSTIC BILAT
8 of 10 series · 8 of 10 positions shown · non-contrast
Comparison: Previous examinations at [facility] dated
09/18/2009 and 01/11/2008.

CLINICAL DATA: Mass felt by the patient in the upper inner right
breast. Status post bilateral breast reduction in 5474.

EXAM:
DIGITAL DIAGNOSTIC  BILATERAL MAMMOGRAM WITH CAD
ULTRASOUND RIGHT BREAST

[R CC (1 of 4)]
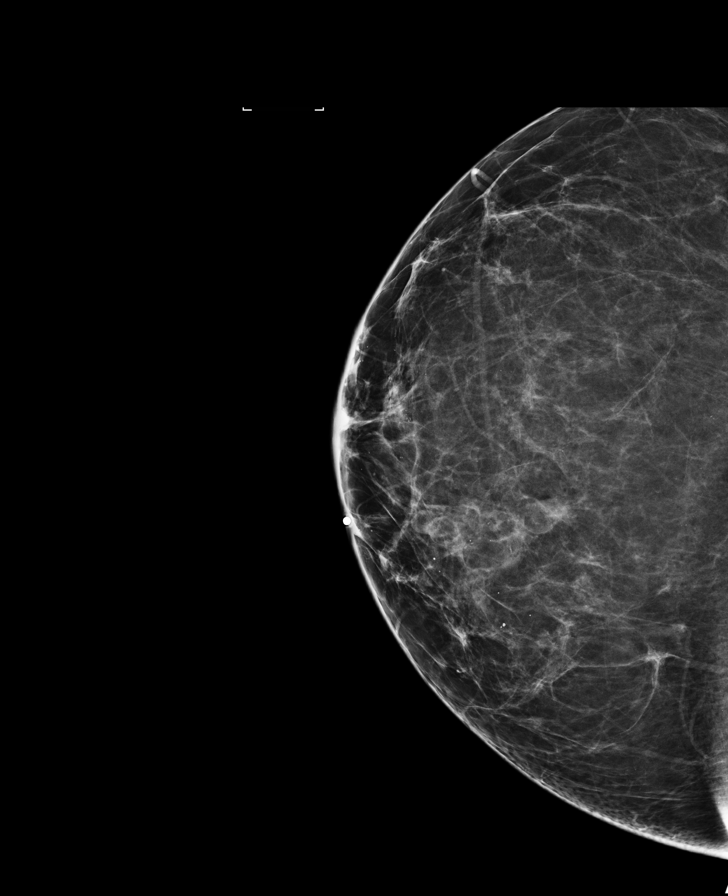

[R CC (2 of 4)]
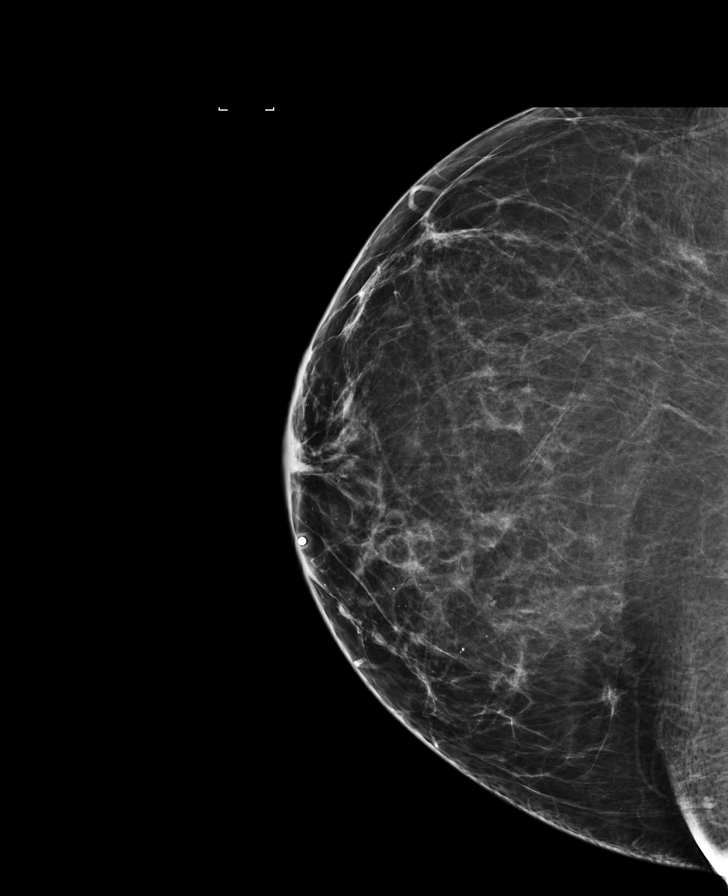

[R ML]
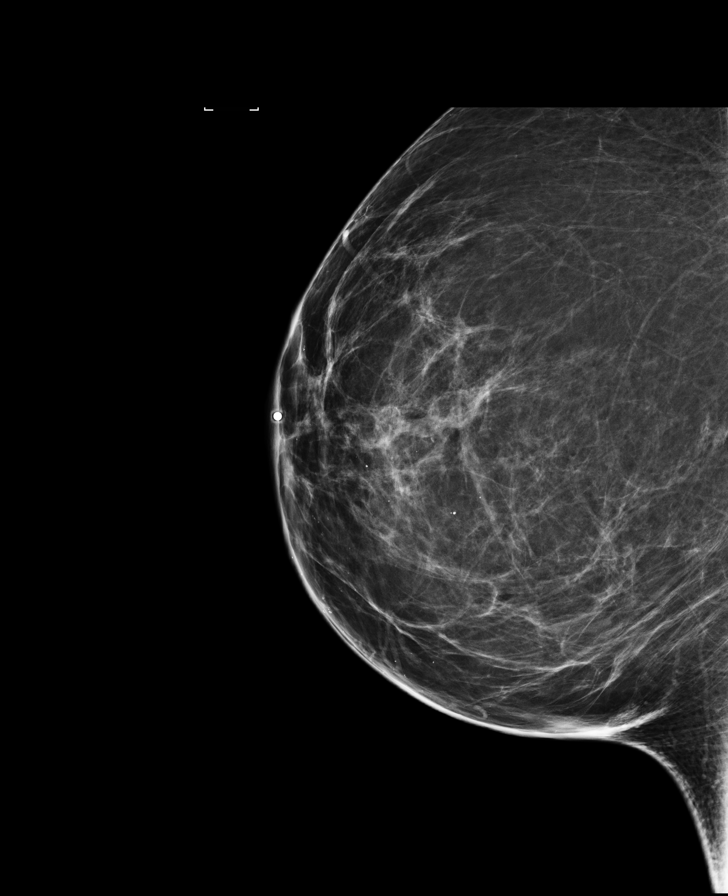

[R TAN]
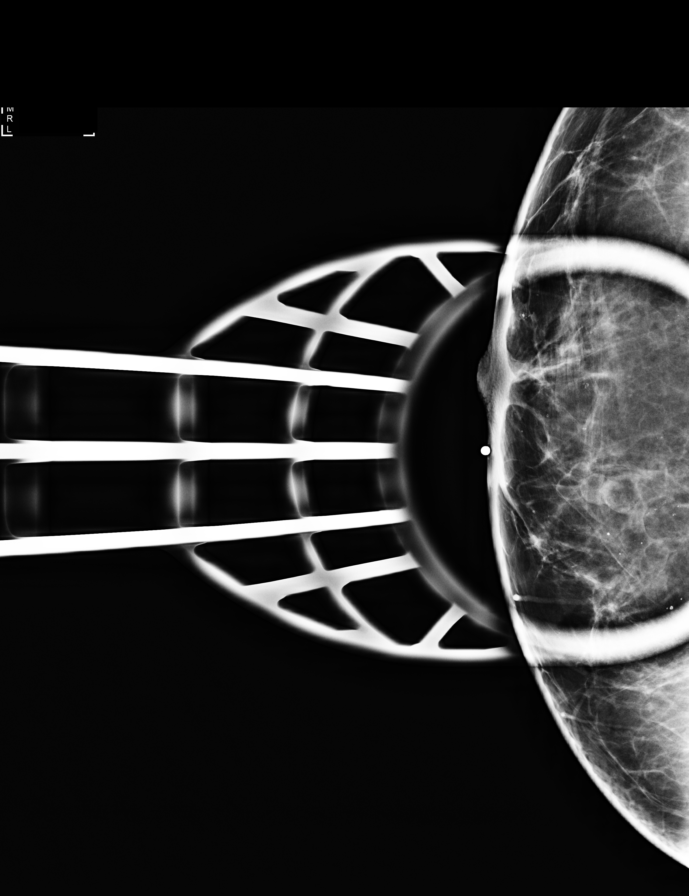

[R CC (3 of 4)]
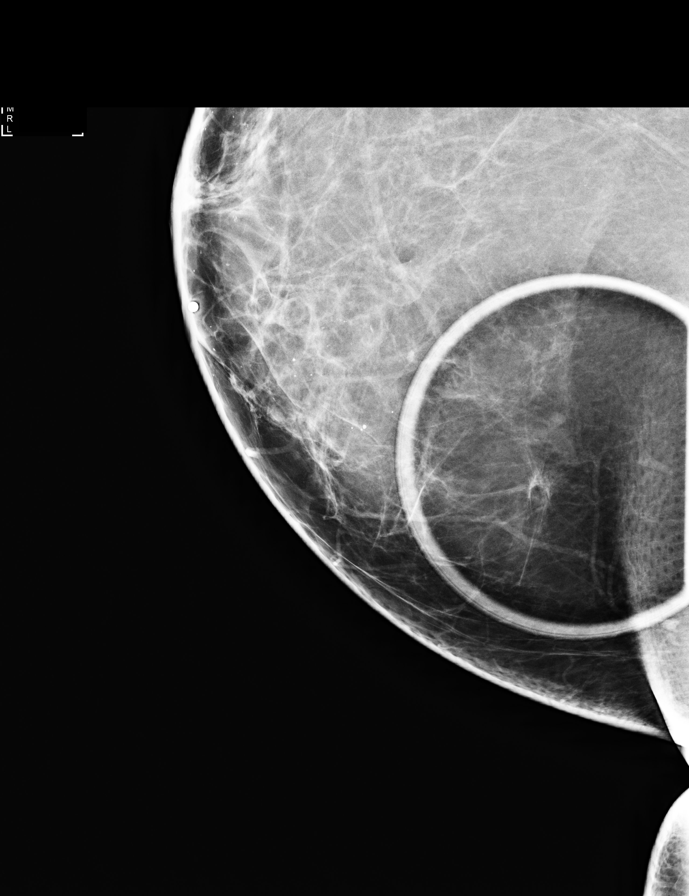

[R CC (4 of 4)]
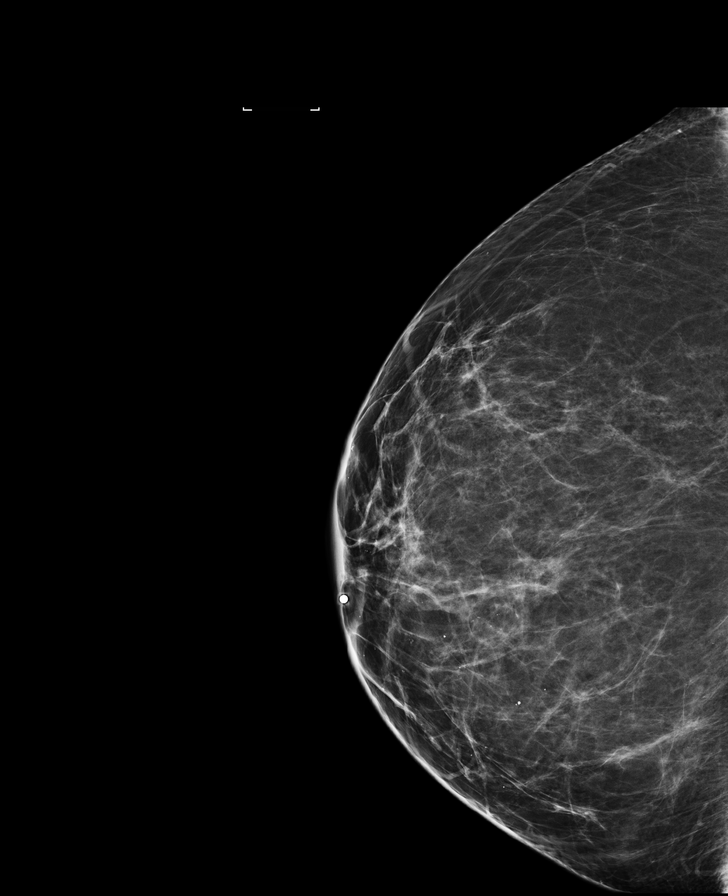

[L MLO]
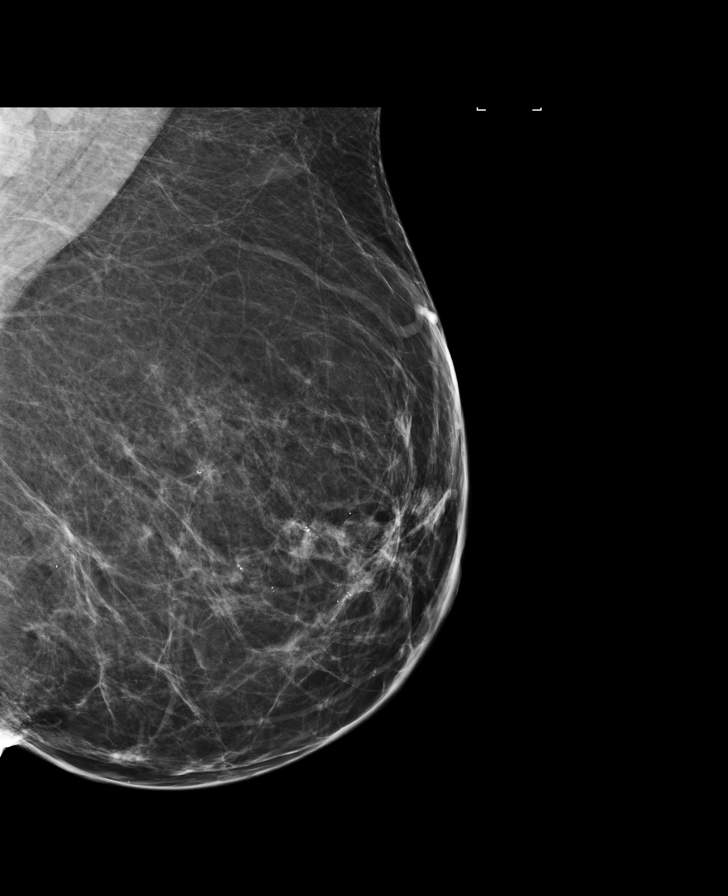

[R MLO]
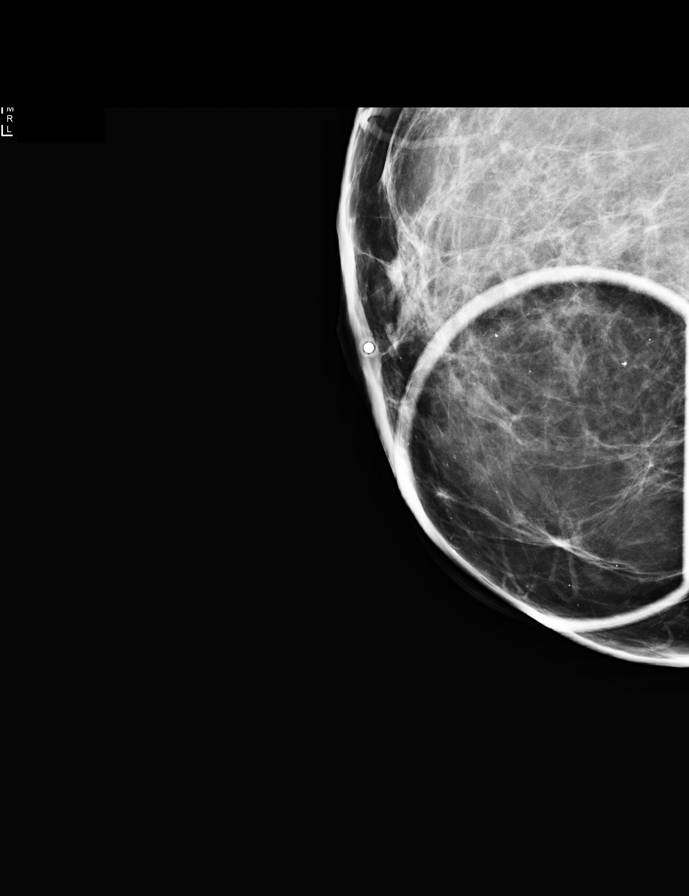

[8 of 10 positions shown; findings below may reference images not displayed]

ACR Breast Density Category b: There are scattered areas of
fibroglandular density.
FINDINGS: Bilateral postreduction changes with areas of bilateral fat necrosis
and oil cyst formation. This includes an area of fat necrosis and
oil cyst formation at the location of the mass felt by the patient
in the upper inner right breast, marked with a metallic marker. On
the initial craniocaudal view of the right breast, there was also an
irregular density deep in the medial aspect of the breast. This has
an appearance of normal fibroglandular tissue and Cooper's ligaments
on the rolled craniocaudal views, unchanged compared to the previous
examinations.

Mammographic images were processed with CAD.

On physical exam, the patient has an approximately 1.5 cm rounded
palpable mass like area in the upper inner right breast at the
location of patient concern. No mass is palpable elsewhere in the
medial right breast.

Ultrasound is performed, showing areas of fat necrosis and oil cyst
formation with developing calcification and posterior acoustical
shadowing and posterior acoustical enhancement in the 2 o'clock
position of the right breast, 2 cm from the nipple, at the area of
palpable concern. No abnormality elsewhere in the medial right
breast.
IMPRESSION: Bilateral postreduction fat necrosis, including the area of patient
concern on the right. No evidence of malignancy.

RECOMMENDATION:
Bilateral screening mammogram in 1 year.

I have discussed the findings and recommendations with the patient.
Results were also provided in writing at the conclusion of the
visit. If applicable, a reminder letter will be sent to the patient
regarding the next appointment.

BI-RADS CATEGORY  2: Benign.

## 2015-05-27 MED FILL — ESOMEPRAZOLE MAG DR 40 MG C: 40 | 90 days supply | Qty: 90 | Fill #0

## 2015-06-23 ENCOUNTER — Other Ambulatory Visit: Payer: 59 | Admitting: Internal Medicine

## 2015-06-23 ENCOUNTER — Other Ambulatory Visit: Payer: Self-pay | Admitting: Internal Medicine

## 2015-06-23 DIAGNOSIS — E785 Hyperlipidemia, unspecified: Secondary | ICD-10-CM | POA: Diagnosis not present

## 2015-06-23 DIAGNOSIS — E119 Type 2 diabetes mellitus without complications: Secondary | ICD-10-CM

## 2015-06-23 DIAGNOSIS — Z79899 Other long term (current) drug therapy: Secondary | ICD-10-CM

## 2015-06-23 DIAGNOSIS — E559 Vitamin D deficiency, unspecified: Secondary | ICD-10-CM | POA: Diagnosis not present

## 2015-06-23 LAB — LIPID PANEL
CHOLESTEROL: 184 mg/dL (ref 125–200)
HDL: 61 mg/dL (ref >=46)
LDL Cholesterol: 96 mg/dL (ref <130)
Total CHOL/HDL Ratio: 3 Ratio (ref <=5.0)
Triglycerides: 134 mg/dL (ref <150)
VLDL: 27 mg/dL (ref <30)

## 2015-06-23 LAB — HEPATIC FUNCTION PANEL
ALBUMIN: 4.5 g/dL (ref 3.6–5.1)
ALK PHOS: 91 U/L (ref 33–115)
ALT: 13 U/L (ref 6–29)
AST: 16 U/L (ref 10–30)
BILIRUBIN TOTAL: 0.3 mg/dL (ref 0.2–1.2)
Bilirubin, Direct: 0.1 mg/dL (ref <=0.2)
Indirect Bilirubin: 0.2 mg/dL (ref 0.2–1.2)
TOTAL PROTEIN: 7.3 g/dL (ref 6.1–8.1)

## 2015-06-23 LAB — HEMOGLOBIN A1C
HEMOGLOBIN A1C: 5.8 % — AB (ref <5.7)
Mean Plasma Glucose: 120 mg/dL

## 2015-06-23 MED FILL — metFORMIN HCL 500 MG TABS: 500 | 90 days supply | Qty: 180 | Fill #1

## 2015-06-23 MED FILL — SYNTHROID 100 MCG TABLET: 100 | 90 days supply | Qty: 90 | Fill #0

## 2015-06-23 MED FILL — BENAZEPRIL-HCTZ 20-12.5 MG: 20-12.5 | 90 days supply | Qty: 90 | Fill #0

## 2015-06-23 MED FILL — ATORVASTATIN 20 MG TABLET: 20 | 90 days supply | Qty: 90 | Fill #1

## 2015-06-26 ENCOUNTER — Encounter: Payer: Self-pay | Admitting: Internal Medicine

## 2015-06-26 ENCOUNTER — Ambulatory Visit (INDEPENDENT_AMBULATORY_CARE_PROVIDER_SITE_OTHER): Payer: 59 | Admitting: Internal Medicine

## 2015-06-26 VITALS — BP 118/72 | HR 72 | Temp 98.1°F | Resp 18 | Wt 258.0 lb

## 2015-06-26 DIAGNOSIS — E669 Obesity, unspecified: Secondary | ICD-10-CM | POA: Diagnosis not present

## 2015-06-26 DIAGNOSIS — E8881 Metabolic syndrome: Secondary | ICD-10-CM

## 2015-06-26 DIAGNOSIS — E785 Hyperlipidemia, unspecified: Secondary | ICD-10-CM

## 2015-06-26 DIAGNOSIS — E559 Vitamin D deficiency, unspecified: Secondary | ICD-10-CM | POA: Diagnosis not present

## 2015-06-26 DIAGNOSIS — I10 Essential (primary) hypertension: Secondary | ICD-10-CM | POA: Diagnosis not present

## 2015-06-26 DIAGNOSIS — E039 Hypothyroidism, unspecified: Secondary | ICD-10-CM

## 2015-06-26 DIAGNOSIS — R7302 Impaired glucose tolerance (oral): Secondary | ICD-10-CM | POA: Diagnosis not present

## 2015-06-27 LAB — VITAMIN D 25 HYDROXY (VIT D DEFICIENCY, FRACTURES): Vit D, 25-Hydroxy: 46 ng/mL (ref 30–100)

## 2015-07-09 NOTE — Patient Instructions (Signed)
It was pleasure to see you today. Continue to work on diet exercise and weight loss. Continue same medications and follow-up in November.

## 2015-07-09 NOTE — Progress Notes (Signed)
   Subjective:    Patient ID: Dana Washington, female    DOB: [birth year], 40 y.o.   MRN: [id number]  HPI Three-month follow-up on vitamin D deficiency after taking high-dose vitamin D for 12 weeks then daily vitamin D supplement, follow-up on hyperlipidemia, specifically triglycerides, follow-up on impaired glucose tolerance. Patient states to work very hard. Is looking forward to a trip in the Fall    Review of Systems     Objective:   Physical Exam Neck is supple without JVD thyromegaly or carotid bruits. Chest clear. Cardiac exam regular rate and rhythm normal S1 and S2. Extremities without edema.       Assessment & Plan:  Impaired glucose tolerance-11 A1c improved from 5.9% 5.8%. I believe glucose intolerance would go away if she was able to lose some weight  Hyperlipidemia-improvement in triglycerides from 202-134 which is significant  Vitamin D deficiency-vitamin D level now within normal limits after taking 50,000 units vitamin D3 weekly for 12 weeks then 2000 units daily.  Obesity-encouraged diet exercise and weight loss  Essential hypertension-stable on current regimen  Hypothyroidism-TSH not checked at this visit  Metabolic syndrome  Plan: Return in November for follow-up. Continue to encourage diet exercise weight loss and taking time for herself

## 2015-07-21 MED FILL — TOPIRAMATE 100 MG TABLET: 100 | 90 days supply | Qty: 90 | Fill #2

## 2015-07-28 MED FILL — LARIN FE 1.5-30 TABLET: 1.5-30 | 84 days supply | Qty: 84 | Fill #2

## 2015-07-28 MED FILL — FLUoxetine HCL 20 MG CAPS: 20 | 90 days supply | Qty: 90 | Fill #1

## 2015-08-26 MED FILL — ESOMEPRAZOLE MAG DR 40 MG C: 40 | 90 days supply | Qty: 90 | Fill #1

## 2015-09-16 ENCOUNTER — Other Ambulatory Visit: Payer: Self-pay | Admitting: Internal Medicine

## 2015-09-16 MED FILL — BENAZEPRIL-HCTZ 20-12.5 MG: 20-12.5 | 90 days supply | Qty: 90 | Fill #0

## 2015-09-16 MED FILL — SYNTHROID 100 MCG TABLET: 100 | 90 days supply | Qty: 90 | Fill #0

## 2015-09-16 NOTE — Telephone Encounter (Signed)
Please refill x 6 months 

## 2015-09-18 MED FILL — SCOPOLAMINE 1 MG/3 DAY PATC: 1 | 30 days supply | Qty: 10 | Fill #0

## 2015-10-13 MED FILL — TOPIRAMATE 100 MG TABLET: 100 | 90 days supply | Qty: 90 | Fill #3

## 2015-10-15 ENCOUNTER — Other Ambulatory Visit: Payer: Self-pay | Admitting: Internal Medicine

## 2015-10-15 DIAGNOSIS — Z1231 Encounter for screening mammogram for malignant neoplasm of breast: Secondary | ICD-10-CM

## 2015-10-17 MED FILL — LARIN FE 1.5-30 TABLET: 1.5-30 | 84 days supply | Qty: 84 | Fill #3

## 2015-10-27 MED FILL — ATORVASTATIN 20 MG TABLET: 20 | 90 days supply | Qty: 90 | Fill #2

## 2015-10-27 MED FILL — FLUoxetine HCL 20 MG CAPS: 20 | 90 days supply | Qty: 90 | Fill #1

## 2015-10-30 ENCOUNTER — Ambulatory Visit
Admission: RE | Admit: 2015-10-30 | Discharge: 2015-10-30 | Disposition: A | Discharge status: Home / Self Care | Payer: 59 | Source: Ambulatory Visit | Attending: Internal Medicine | Admitting: Internal Medicine

## 2015-10-30 DIAGNOSIS — Z1231 Encounter for screening mammogram for malignant neoplasm of breast: Secondary | ICD-10-CM | POA: Diagnosis not present

## 2015-11-14 ENCOUNTER — Other Ambulatory Visit: Payer: 59 | Admitting: Internal Medicine

## 2015-11-14 DIAGNOSIS — R7302 Impaired glucose tolerance (oral): Secondary | ICD-10-CM | POA: Diagnosis not present

## 2015-11-14 DIAGNOSIS — E785 Hyperlipidemia, unspecified: Secondary | ICD-10-CM | POA: Diagnosis not present

## 2015-11-14 LAB — HEMOGLOBIN A1C
HEMOGLOBIN A1C: 5.5 % (ref <5.7)
Mean Plasma Glucose: 111 mg/dL

## 2015-11-15 LAB — LIPID PANEL
Cholesterol: 156 mg/dL (ref 125–200)
HDL: 48 mg/dL (ref >=46)
LDL Cholesterol: 82 mg/dL (ref <130)
Total CHOL/HDL Ratio: 3.3 Ratio (ref <=5.0)
Triglycerides: 129 mg/dL (ref <150)
VLDL: 26 mg/dL (ref <30)

## 2015-11-15 IMAGING — CR DG CHEST 2V
2 series · 2 of 2 positions shown · non-contrast
Comparison: None.

CLINICAL DATA: Persistent coughing congestion.  Low-grade fever.

EXAM:
CHEST  2 VIEW

[view not recorded (1 of 2)]
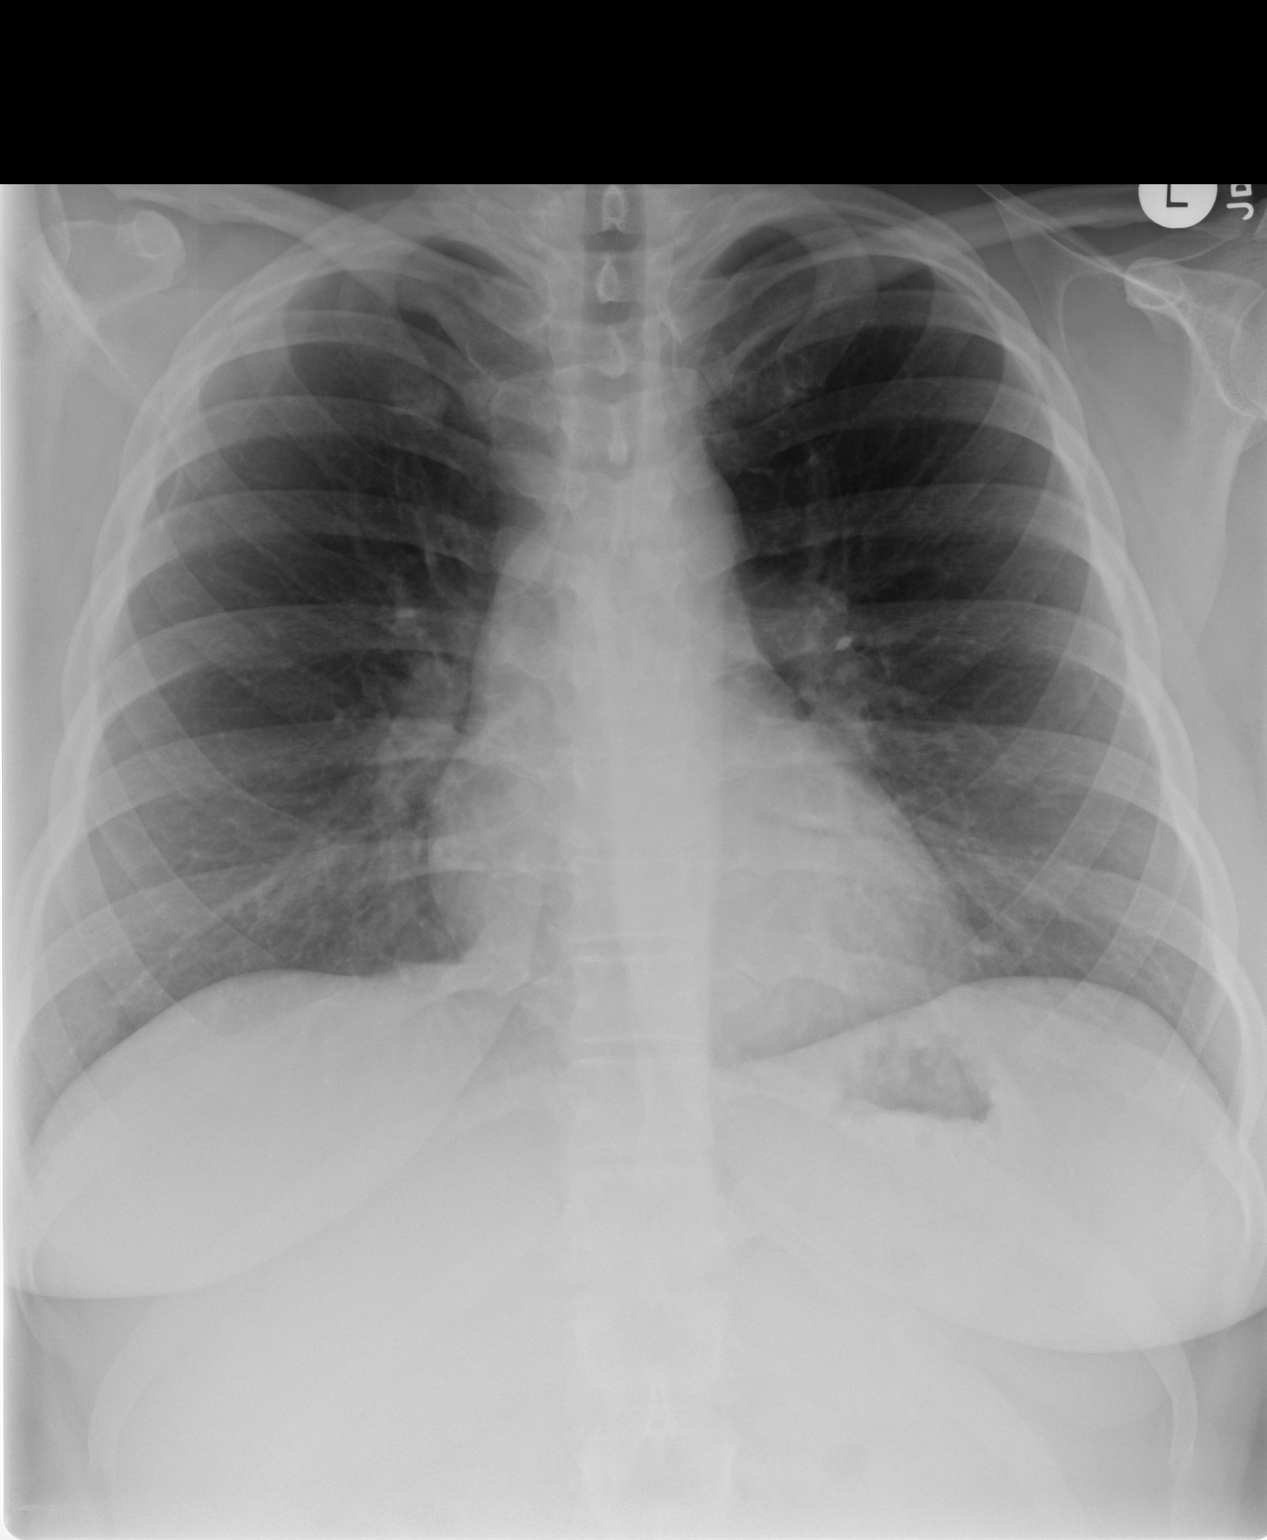

[view not recorded (2 of 2)]
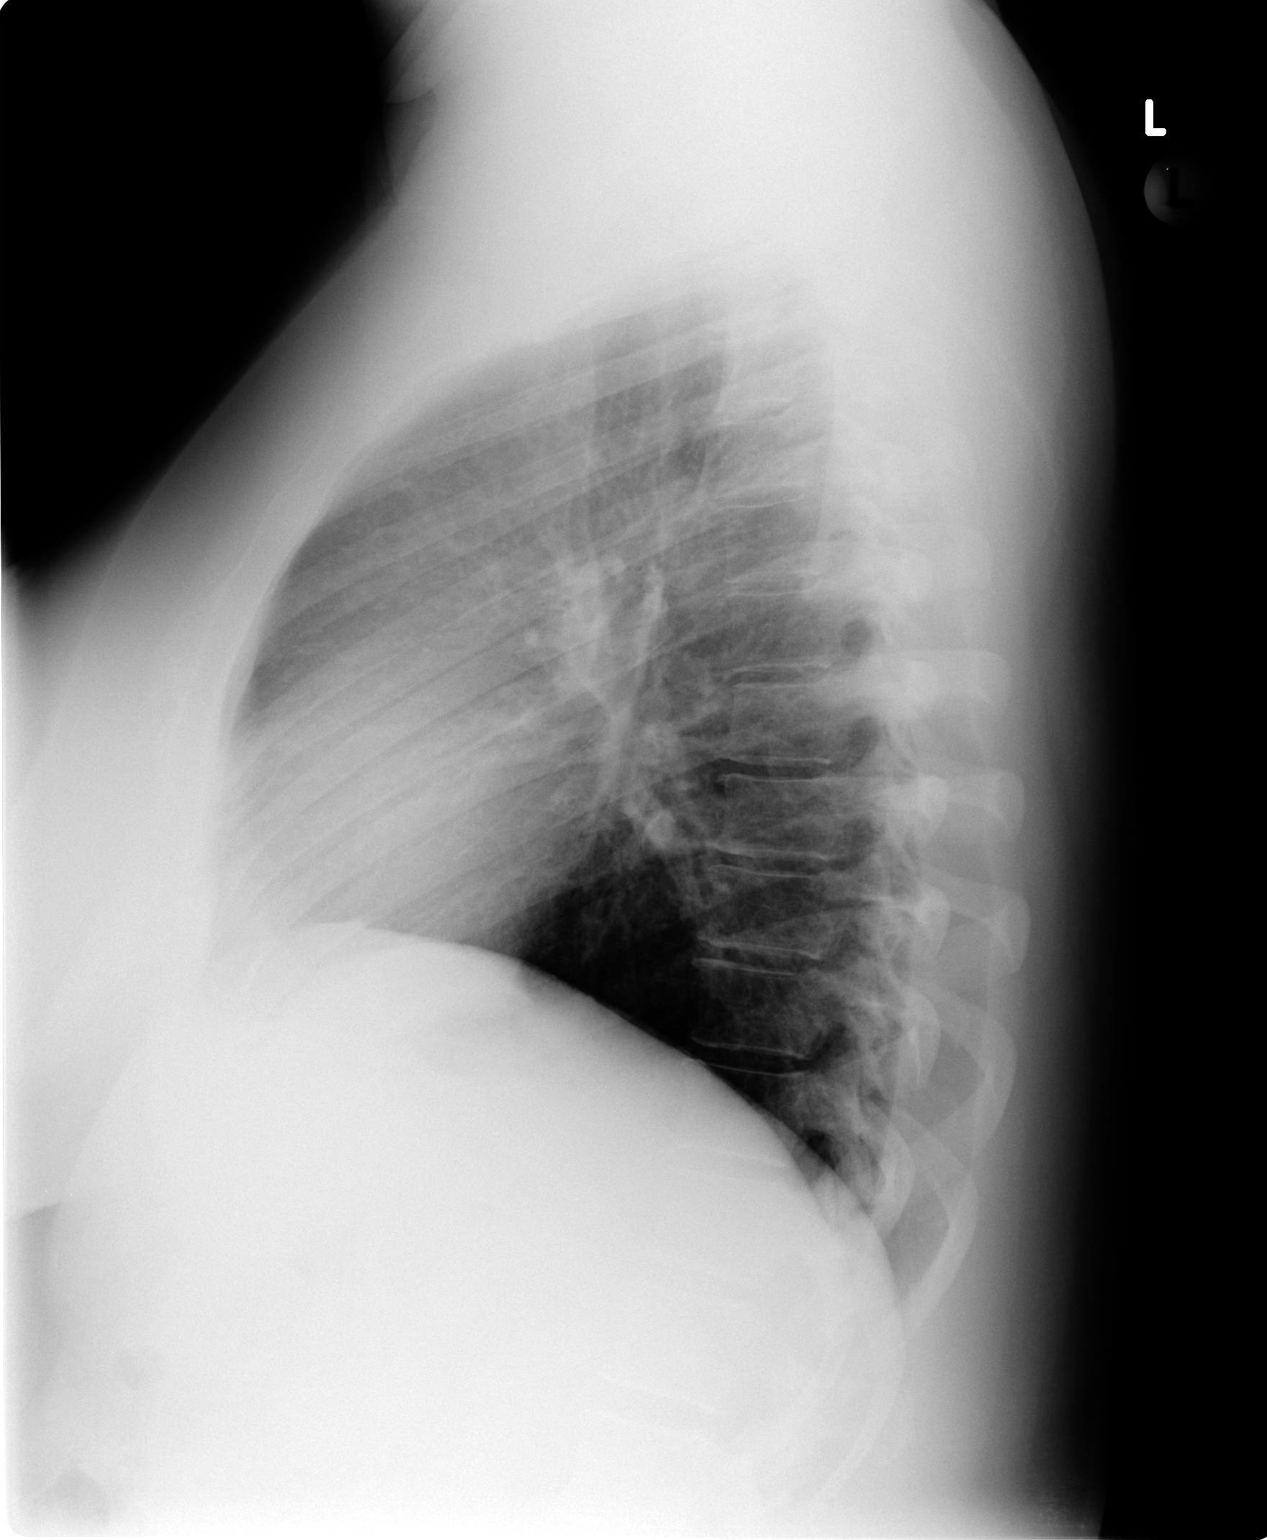

[2 of 2 positions shown; findings below may reference images not displayed]

FINDINGS: The heart size and mediastinal contours are within normal limits.
Both lungs are clear. The visualized skeletal structures are
unremarkable.
IMPRESSION: No active cardiopulmonary disease.

## 2015-11-17 ENCOUNTER — Other Ambulatory Visit: Payer: 59 | Admitting: Internal Medicine

## 2015-11-18 ENCOUNTER — Ambulatory Visit (INDEPENDENT_AMBULATORY_CARE_PROVIDER_SITE_OTHER): Payer: 59 | Admitting: Internal Medicine

## 2015-11-18 ENCOUNTER — Encounter: Payer: Self-pay | Admitting: Internal Medicine

## 2015-11-18 VITALS — BP 120/84 | HR 86 | Temp 97.6°F | Wt 266.0 lb

## 2015-11-18 DIAGNOSIS — E782 Mixed hyperlipidemia: Secondary | ICD-10-CM | POA: Diagnosis not present

## 2015-11-18 DIAGNOSIS — E559 Vitamin D deficiency, unspecified: Secondary | ICD-10-CM | POA: Diagnosis not present

## 2015-11-18 DIAGNOSIS — I10 Essential (primary) hypertension: Secondary | ICD-10-CM | POA: Diagnosis not present

## 2015-11-18 DIAGNOSIS — F329 Major depressive disorder, single episode, unspecified: Secondary | ICD-10-CM

## 2015-11-18 DIAGNOSIS — F419 Anxiety disorder, unspecified: Secondary | ICD-10-CM

## 2015-11-18 DIAGNOSIS — F418 Other specified anxiety disorders: Secondary | ICD-10-CM

## 2015-11-18 DIAGNOSIS — R7302 Impaired glucose tolerance (oral): Secondary | ICD-10-CM | POA: Diagnosis not present

## 2015-11-18 DIAGNOSIS — F32A Depression, unspecified: Secondary | ICD-10-CM

## 2015-11-24 MED FILL — ESOMEPRAZOLE MAG DR 40 MG C: 40 | 90 days supply | Qty: 90 | Fill #2

## 2015-12-11 NOTE — Progress Notes (Signed)
   Subjective:    Patient ID: Adrian Prince, female    DOB: 13-May-1975, 40 y.o.   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 Recently went on a cruise and had a great time. Was to go on a cruise to [place] this coming summer. Needs to get time off from work. 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 The only help she has is from [place]. Still on call a great deal. Seems less stressed. The vacation helped her a lot.  History of hyperlipidemia, vitamin D deficiency, anxiety depression, essential hypertension, hypothyroidism, obesity and impaired glucose tolerance.  Lipid panel is normal liver functions are normal. Hemoglobin A1c 5.5% and previously was 5.8%.    Review of Systems see above. No new complaints     Objective:   Physical Exam  Chest clear. Cardiac exam regular rate and rhythm. Extremities without edema      Assessment & Plan:  Impaired glucose tolerance  Hyperlipidemia  Vitamin D deficiency  Anxiety depression  Essential hypertension-stable on current regimen  Plan: An pleased with her lab work today. I'm glad she had a good time on vacation. She needed the vacation very much. She should schedule time off on a regular basis. Return in 6 months for physical exam.

## 2015-12-11 NOTE — Patient Instructions (Signed)
It was pleasure to see you today. Continue good work with diet and exercise and follow up in 6 months.

## 2015-12-14 ENCOUNTER — Telehealth: Payer: 59 | Admitting: Physician Assistant

## 2015-12-14 DIAGNOSIS — H1030 Unspecified acute conjunctivitis, unspecified eye: Secondary | ICD-10-CM | POA: Diagnosis not present

## 2015-12-14 MED ORDER — POLYMYXIN B-TRIMETHOPRIM 10000-0.1 UNIT/ML-% OP SOLN: OPHTHALMIC | 0 refills | Status: DC

## 2015-12-14 NOTE — Progress Notes (Signed)

## 2015-12-22 ENCOUNTER — Other Ambulatory Visit: Payer: Self-pay | Admitting: Internal Medicine

## 2015-12-22 MED FILL — BENAZEPRIL-HCTZ 20-12.5 MG: 20-12.5 | 90 days supply | Qty: 90 | Fill #0

## 2015-12-22 MED FILL — SYNTHROID 100 MCG TABLET: 100 | 90 days supply | Qty: 90 | Fill #0

## 2016-01-06 ENCOUNTER — Telehealth: Payer: Self-pay | Admitting: Internal Medicine

## 2016-01-06 NOTE — Telephone Encounter (Signed)
Patient states that Dr. Evern Bio prescribed her birth control pills in the past.  She didn't need to have her PAP smear repeated for 3 years so she didn't go back to see her.  And, she had refills on her BC pills, so she didn't make an appointment to go back to her.  Now, she needs refills on her BP pills and wants to know if you would be willing to refill those for her??  Patient states she's currently on Junel birth control.  I don't see them listed on her chart.    Pharmacy:  [facility]  Best # for contact:  [phone]

## 2016-01-06 NOTE — Telephone Encounter (Signed)
No not willing to refill. At time of CPE it says pelvic exam deferred to GYN. Needs to be seen there yearly for pill Rx despite when Pap is due.

## 2016-01-06 NOTE — Telephone Encounter (Signed)
Spoke with patient and advised that she will need to contact her GYN to have a refill on her BC pills.  Patient verbalized understanding of this conversation.  She'll reach out to their office.

## 2016-01-07 MED FILL — LARIN FE 1.5-30 TABLET: 1.5-30 | 84 days supply | Qty: 84 | Fill #0

## 2016-01-09 DIAGNOSIS — R61 Generalized hyperhidrosis: Secondary | ICD-10-CM | POA: Diagnosis not present

## 2016-01-09 DIAGNOSIS — Z01419 Encounter for gynecological examination (general) (routine) without abnormal findings: Secondary | ICD-10-CM | POA: Diagnosis not present

## 2016-01-09 DIAGNOSIS — Z793 Long term (current) use of hormonal contraceptives: Secondary | ICD-10-CM | POA: Diagnosis not present

## 2016-01-09 DIAGNOSIS — Z3041 Encounter for surveillance of contraceptive pills: Secondary | ICD-10-CM | POA: Diagnosis not present

## 2016-01-09 DIAGNOSIS — Z6841 Body Mass Index (BMI) 40.0 and over, adult: Secondary | ICD-10-CM | POA: Diagnosis not present

## 2016-01-16 ENCOUNTER — Other Ambulatory Visit: Payer: Self-pay | Admitting: Internal Medicine

## 2016-01-16 MED FILL — TOPIRAMATE 100 MG TABLET: 100 | 90 days supply | Qty: 90 | Fill #0

## 2016-01-19 MED FILL — ATORVASTATIN 20 MG TABLET: 20 | 90 days supply | Qty: 90 | Fill #3

## 2016-02-02 ENCOUNTER — Other Ambulatory Visit: Payer: Self-pay | Admitting: Internal Medicine

## 2016-02-02 MED FILL — FLUoxetine HCL 20 MG CAPS: 20 | 90 days supply | Qty: 90 | Fill #0

## 2016-02-17 MED FILL — ESOMEPRAZOLE MAG DR 40 MG C: 40 | 90 days supply | Qty: 90 | Fill #3

## 2016-02-23 ENCOUNTER — Ambulatory Visit (INDEPENDENT_AMBULATORY_CARE_PROVIDER_SITE_OTHER): Payer: 59 | Admitting: Internal Medicine

## 2016-02-23 ENCOUNTER — Encounter: Payer: Self-pay | Admitting: Internal Medicine

## 2016-02-23 VITALS — BP 122/88 | HR 104 | Temp 98.5°F | Wt 259.0 lb

## 2016-02-23 DIAGNOSIS — L509 Urticaria, unspecified: Secondary | ICD-10-CM

## 2016-02-23 MED ORDER — PREDNISONE 10 MG PO TABS: ORAL_TABLET | ORAL | 0 refills | Status: DC

## 2016-02-23 MED FILL — predniSONE 10 MG TABS: 10 | 12 days supply | Qty: 42 | Fill #0

## 2016-02-23 NOTE — Patient Instructions (Addendum)
Take Zantac 150 mg twice daily for at least 2 weeks. Zyrtec 10 mg daily. Sterapred DS 10 mg 12 day dosepak take as directed.See allergist if symptoms persist.

## 2016-02-23 NOTE — Progress Notes (Signed)
   Subjective:    Patient ID: Dana Washington, female    DOB: 03/28/1975, 41 y.o.   MRN: [id number]  HPI  Started having outbreak of hives yesterday about 3 pm.  Ate cod Friday and yesterday. Broke out in hives mostly on arms  and cheeks. Says lesions are painful. No fever. No recent episode of urticaria. In 2013 had issues with rash on her arm in August 2013 and also had recurrent rash under her left eye. Was thought to initially have Herpes zoster under eye. No ocular involvement per opthalmologist.  She has dogs. Says she has had an outside rat problem and was worried about leptospirosis.    Review of Systems see above- no intake of chocolate or shellfish. No viral illness recently. No change in meds     Objective:   Physical Exam Urticarial lesions on her forearms bilaterally. Cheeks are red. Eyes are not swollen.       Assessment & Plan:  Urticaria-etiology unclear. Denies being worried or upset about anything  Plan:Zyrtec 10 mg at bedtime. Zantac 150 mg by mouth twice daily. Sterapred DS 10 mg 12 day Dosepak. If symptoms persist, she will need to see allergist.

## 2016-03-04 ENCOUNTER — Other Ambulatory Visit: Payer: Self-pay | Admitting: Internal Medicine

## 2016-03-04 MED FILL — POLYETHYLENE GLYCOL 3350 PO: 90 days supply | Qty: 1581 | Fill #0

## 2016-03-16 ENCOUNTER — Other Ambulatory Visit: Payer: Self-pay | Admitting: Internal Medicine

## 2016-03-16 MED FILL — BENAZEPRIL-HCTZ 20-12.5 MG: 20-12.5 | 90 days supply | Qty: 90 | Fill #0

## 2016-03-16 MED FILL — SYNTHROID 100 MCG TABLET: 100 | 90 days supply | Qty: 90 | Fill #0

## 2016-03-26 ENCOUNTER — Other Ambulatory Visit: Payer: 59 | Admitting: Internal Medicine

## 2016-03-29 ENCOUNTER — Encounter: Payer: Self-pay | Admitting: Internal Medicine

## 2016-03-29 ENCOUNTER — Ambulatory Visit (INDEPENDENT_AMBULATORY_CARE_PROVIDER_SITE_OTHER): Payer: 59 | Admitting: Internal Medicine

## 2016-03-29 VITALS — BP 116/82 | HR 74 | Temp 98.6°F | Ht 64.5 in | Wt 239.0 lb

## 2016-03-29 DIAGNOSIS — E119 Type 2 diabetes mellitus without complications: Secondary | ICD-10-CM | POA: Diagnosis not present

## 2016-03-29 DIAGNOSIS — E785 Hyperlipidemia, unspecified: Secondary | ICD-10-CM | POA: Diagnosis not present

## 2016-03-29 DIAGNOSIS — Z1321 Encounter for screening for nutritional disorder: Secondary | ICD-10-CM

## 2016-03-29 DIAGNOSIS — E8881 Metabolic syndrome: Secondary | ICD-10-CM

## 2016-03-29 DIAGNOSIS — I10 Essential (primary) hypertension: Secondary | ICD-10-CM | POA: Diagnosis not present

## 2016-03-29 DIAGNOSIS — F329 Major depressive disorder, single episode, unspecified: Secondary | ICD-10-CM

## 2016-03-29 DIAGNOSIS — Z9889 Other specified postprocedural states: Secondary | ICD-10-CM

## 2016-03-29 DIAGNOSIS — E039 Hypothyroidism, unspecified: Secondary | ICD-10-CM | POA: Diagnosis not present

## 2016-03-29 DIAGNOSIS — Z0000 Encounter for general adult medical examination without abnormal findings: Secondary | ICD-10-CM | POA: Diagnosis not present

## 2016-03-29 DIAGNOSIS — R7302 Impaired glucose tolerance (oral): Secondary | ICD-10-CM

## 2016-03-29 DIAGNOSIS — F419 Anxiety disorder, unspecified: Secondary | ICD-10-CM

## 2016-03-29 DIAGNOSIS — Z8669 Personal history of other diseases of the nervous system and sense organs: Secondary | ICD-10-CM | POA: Diagnosis not present

## 2016-03-29 DIAGNOSIS — F418 Other specified anxiety disorders: Secondary | ICD-10-CM

## 2016-03-29 DIAGNOSIS — Z6841 Body Mass Index (BMI) 40.0 and over, adult: Secondary | ICD-10-CM

## 2016-03-29 DIAGNOSIS — F32A Depression, unspecified: Secondary | ICD-10-CM

## 2016-03-29 LAB — CBC WITH DIFFERENTIAL/PLATELET
Basophils Absolute: 0 cells/uL (ref 0–200)
Basophils Relative: 0 %
EOS PCT: 1 %
Eosinophils Absolute: 62 cells/uL (ref 15–500)
HCT: 45.8 % — ABNORMAL HIGH (ref 35.0–45.0)
HEMOGLOBIN: 15.2 g/dL (ref 11.7–15.5)
LYMPHS ABS: 1426 {cells}/uL (ref 850–3900)
Lymphocytes Relative: 23 %
MCH: 30.2 pg (ref 27.0–33.0)
MCHC: 33.2 g/dL (ref 32.0–36.0)
MCV: 90.9 fL (ref 80.0–100.0)
MPV: 9.6 fL (ref 7.5–12.5)
Monocytes Absolute: 434 cells/uL (ref 200–950)
Monocytes Relative: 7 %
NEUTROS ABS: 4278 {cells}/uL (ref 1500–7800)
Neutrophils Relative %: 69 %
Platelets: 395 10*3/uL (ref 140–400)
RBC: 5.04 MIL/uL (ref 3.80–5.10)
RDW: 15 % (ref 11.0–15.0)
WBC: 6.2 10*3/uL (ref 3.8–10.8)

## 2016-03-29 LAB — COMPREHENSIVE METABOLIC PANEL
ALBUMIN: 4.2 g/dL (ref 3.6–5.1)
ALK PHOS: 75 U/L (ref 33–115)
ALT: 24 U/L (ref 6–29)
AST: 21 U/L (ref 10–30)
BUN: 11 mg/dL (ref 7–25)
CO2: 24 mmol/L (ref 20–31)
CREATININE: 0.99 mg/dL (ref 0.50–1.10)
Calcium: 9.5 mg/dL (ref 8.6–10.2)
Chloride: 106 mmol/L (ref 98–110)
Glucose, Bld: 96 mg/dL (ref 65–99)
Potassium: 4 mmol/L (ref 3.5–5.3)
SODIUM: 139 mmol/L (ref 135–146)
TOTAL PROTEIN: 7 g/dL (ref 6.1–8.1)
Total Bilirubin: 0.4 mg/dL (ref 0.2–1.2)

## 2016-03-29 LAB — LIPID PANEL
CHOLESTEROL: 160 mg/dL (ref <200)
HDL: 45 mg/dL — ABNORMAL LOW (ref >50)
LDL Cholesterol: 86 mg/dL (ref <100)
TRIGLYCERIDES: 145 mg/dL (ref <150)
Total CHOL/HDL Ratio: 3.6 Ratio (ref <5.0)
VLDL: 29 mg/dL (ref <30)

## 2016-03-29 LAB — TSH: TSH: 0.83 mIU/L

## 2016-03-29 MED FILL — LARIN FE 1.5-30 TABLET: 1.5-30 | 84 days supply | Qty: 84 | Fill #0

## 2016-03-29 NOTE — Progress Notes (Signed)
Subjective:    Patient ID: Dana Washington, female    DOB: Apr 08, 1975, 41 y.o.   MRN: [id number]  HPI   41 year old Female  for health maintenance exam and evaluation of medical issues.   She has a history of essential hypertension, hyperlipidemia, obesity, metabolic syndrome, impaired glucose tolerance, migraine headaches and hypothyroidism.  She first presented to this office December 2012 having moved from [place].  Apparently hyperlipidemia is inherited from her parents. She has a history of depression treated with Cymbalta.  Social history: Single, never married, no children. Sister lives here. Mother has moved here and is living with her. This is worked out very well. 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 Now getting more time off than she did initially when she started the job. Nonsmoker. Social alcohol consumption. Enjoys dogs.  Family history: Father with history of prostate cancer and hyperlipidemia. Mother with history of skin cancer and hyperlipidemia. Sister with history of migraine headaches. Patient says there is a family history of early-onset breast cancer.  Patient had breast reduction surgery May 2010. Subsequently had bilateral carpal tunnel release February 2012. Lasik surgery 2008 for myopia. History of Hashimoto's thyroiditis with resultant hypothyroidism. History of vitamin D deficiency. Benign lymph node biopsy February 2010. History of seroma left breast which was a result of her breast reduction surgery. Shoulder surgery by Dr. Cherly Beach September 2014. Had a 20% rotator cuff tear and osteoarthritis of the AC joint. Had partial clavicle resection. She had a glenoid labral tear.  Episode of urticaria February 2018 treated with steroids, Zyrtec and Zantac. Symptoms resolved      Review of Systems  Constitutional: Negative.   Eyes: Negative.   Respiratory: Negative.   Cardiovascular: Negative.   Gastrointestinal: Negative.   Genitourinary:  Negative.   Musculoskeletal: Negative.   Neurological:       Migraine headaches  Hematological: Negative.   Psychiatric/Behavioral: Negative.        Objective:   Physical Exam  Constitutional: She is oriented to person, place, and time. She appears well-developed and well-nourished. No distress.  HENT:  Head: Normocephalic and atraumatic.  Right Ear: External ear normal.  Left Ear: External ear normal.  Mouth/Throat: Oropharynx is clear and moist. No oropharyngeal exudate.  Eyes: Conjunctivae and EOM are normal. Pupils are equal, round, and reactive to light. Right eye exhibits no discharge. Left eye exhibits no discharge.  Neck: Neck supple. No JVD present. No thyromegaly present.  Cardiovascular: Normal rate, regular rhythm and normal heart sounds.   No murmur heard. Pulmonary/Chest: Effort normal and breath sounds normal. She has no wheezes. She has no rales.  History of breast reduction surgery. Seroma left breast  Abdominal: Soft. Bowel sounds are normal. She exhibits no distension and no mass. There is no tenderness. There is no rebound and no guarding.  Genitourinary:  Genitourinary Comments: Deferred to GYN  Musculoskeletal: She exhibits no edema.  Lymphadenopathy:    She has no cervical adenopathy.  Neurological: She is alert and oriented to person, place, and time. She has normal reflexes. No cranial nerve deficit. Coordination normal.  Skin: Skin is warm and dry. No rash noted. She is not diaphoretic.  Psychiatric: She has a normal mood and affect. Her behavior is normal. Judgment and thought content normal.  Vitals reviewed.         Assessment & Plan:  Hyperlipidemia-LDL and triglycerides are normal. Total cholesterol normal. HDL cholesterol 45  Impaired glucose tolerance-hemoglobin A1c excellent at 5.4%  History of vitamin D deficiency-level is normal at 34  Hypertension  Hypothyroidism-status post Hashimoto's thyroiditis. TSH is normal thyroid  replacement  BMI 40.39-encourage Weight Watchers as she has been doing  History of migraine headaches  Stressful job  Metabolic syndrome  History of breast reduction surgery  Plan: I'm pleased with her results. I'm pleased that she is participate in Weight Watchers. Return in 6 months.

## 2016-03-30 ENCOUNTER — Encounter: Payer: Self-pay | Admitting: Internal Medicine

## 2016-03-30 LAB — HEMOGLOBIN A1C
HEMOGLOBIN A1C: 5.4 % (ref <5.7)
Mean Plasma Glucose: 108 mg/dL

## 2016-03-30 LAB — MICROALBUMIN / CREATININE URINE RATIO

## 2016-03-30 LAB — VITAMIN D 25 HYDROXY (VIT D DEFICIENCY, FRACTURES): Vit D, 25-Hydroxy: 34 ng/mL (ref 30–100)

## 2016-04-09 NOTE — Patient Instructions (Signed)
It was a pleasure to see you today. Pleased with weight watchers program. Pleased with lab results. Return in 6 months.

## 2016-04-21 MED FILL — TOPIRAMATE 100 MG TABLET: 100 | 90 days supply | Qty: 90 | Fill #1

## 2016-04-25 MED FILL — FLUoxetine HCL 20 MG CAPS: 20 | 90 days supply | Qty: 90 | Fill #1

## 2016-04-26 ENCOUNTER — Other Ambulatory Visit: Payer: Self-pay | Admitting: Internal Medicine

## 2016-04-26 MED FILL — ATORVASTATIN 20 MG TABLET: 20 | 90 days supply | Qty: 90 | Fill #0

## 2016-05-17 ENCOUNTER — Encounter: Payer: Self-pay | Admitting: Internal Medicine

## 2016-05-21 ENCOUNTER — Other Ambulatory Visit: Payer: Self-pay | Admitting: Internal Medicine

## 2016-05-24 MED FILL — ESOMEPRAZOLE MAG DR 40 MG C: 40 | 90 days supply | Qty: 90 | Fill #0

## 2016-05-24 MED FILL — metFORMIN HCL 500 MG TABS: 500 | 90 days supply | Qty: 180 | Fill #0

## 2016-05-30 MED FILL — BENAZEPRIL-HCTZ 20-12.5 MG: 20-12.5 | 90 days supply | Qty: 90 | Fill #0

## 2016-05-30 MED FILL — SYNTHROID 100 MCG TABLET: 100 | 90 days supply | Qty: 90 | Fill #0

## 2016-06-04 MED FILL — LARIN FE 1.5-30 TABLET: 1.5-30 | 84 days supply | Qty: 84 | Fill #1

## 2016-07-08 ENCOUNTER — Telehealth: Payer: 59 | Admitting: Family

## 2016-07-08 DIAGNOSIS — J028 Acute pharyngitis due to other specified organisms: Secondary | ICD-10-CM

## 2016-07-08 DIAGNOSIS — B9689 Other specified bacterial agents as the cause of diseases classified elsewhere: Secondary | ICD-10-CM

## 2016-07-08 MED ORDER — BENZONATATE 100 MG PO CAPS: 100.0000 mg | ORAL_CAPSULE | Freq: Three times a day (TID) | ORAL | 0 refills | Status: DC | PRN

## 2016-07-08 MED ORDER — AZITHROMYCIN 250 MG PO TABS: ORAL_TABLET | ORAL | 0 refills | Status: DC

## 2016-07-08 MED FILL — AZITHROMYCIN 250 MG TAB: 250 | 5 days supply | Qty: 6 | Fill #0

## 2016-07-08 MED FILL — BENZONATATE 100 MG CAP: 100 | 5 days supply | Qty: 30 | Fill #0

## 2016-07-08 NOTE — Progress Notes (Signed)
Thank you for the details you put in the comment boxes. Those details really help [place] take better care of you.   We are sorry that you are not feeling well.  Here is how we plan to help!  Based on what you have shared with me it looks like you have upper respiratory tract inflammation that has resulted in a significant cough.  Inflammation and infection in the upper respiratory tract is commonly called bronchitis and has four common causes:  Allergies, Viral Infections, Acid Reflux and Bacterial Infections.  Allergies, viruses and acid reflux are treated by controlling symptoms or eliminating the cause. An example might be a cough caused by taking certain blood pressure medications. You stop the cough by changing the medication. Another example might be a cough caused by acid reflux. Controlling the reflux helps control the cough.  Based on your presentation I believe you most likely have A cough due to bacteria.  When patients have a fever and a productive cough with a change in color or increased sputum production, we are concerned about bacterial bronchitis.  If left untreated it can progress to pneumonia.  If your symptoms do not improve with your treatment plan it is important that you contact your provider.   I have prescribed Azithromyin 250 mg: two tables now and then one tablet daily for 4 additonal days    In addition you may use A non-prescription cough medication called Mucinex DM: take 2 tablets every 12 hours. and A prescription cough medication called Tessalon Perles 100mg . You may take 1-2 capsules every 8 hours as needed for your cough.   USE OF BRONCHODILATOR ("RESCUE") INHALERS: There is a risk from using your bronchodilator too frequently.  The risk is that over-reliance on a medication which only relaxes the muscles surrounding the breathing tubes can reduce the effectiveness of medications prescribed to reduce swelling and congestion of the tubes themselves.  Although you feel brief  relief from the bronchodilator inhaler, your asthma may actually be worsening with the tubes becoming more swollen and filled with mucus.  This can delay other crucial treatments, such as oral steroid medications. If you need to use a bronchodilator inhaler daily, several times per day, you should discuss this with your provider.  There are probably better treatments that could be used to keep your asthma under control.     HOME CARE . Only take medications as instructed by your medical team. . Complete the entire course of an antibiotic. . Drink plenty of fluids and get plenty of rest. . Avoid close contacts especially the very young and the elderly . Cover your mouth if you cough or cough into your sleeve. . Always remember to wash your hands . A steam or ultrasonic humidifier can help congestion.   GET HELP RIGHT AWAY IF: . You develop worsening fever. . You become short of breath . You cough up blood. . Your symptoms persist after you have completed your treatment plan MAKE SURE YOU   Understand these instructions.  Will watch your condition.  Will get help right away if you are not doing well or get worse.  Your e-visit answers were reviewed by a board certified advanced clinical practitioner to complete your personal care plan.  Depending on the condition, your plan could have included both over the counter or prescription medications. If there is a problem please reply  once you have received a response from your provider. Your safety is important to [place].  If you  have drug allergies check your prescription carefully.    You can use MyChart to ask questions about today's visit, request a non-urgent call back, or ask for a work or school excuse for 24 hours related to this e-Visit. If it has been greater than 24 hours you will need to follow up with your provider, or enter a new e-Visit to address those concerns. You will get an e-mail in the next two days asking about your  experience.  I hope that your e-visit has been valuable and will speed your recovery. Thank you for using e-visits.

## 2016-07-19 ENCOUNTER — Other Ambulatory Visit: Payer: Self-pay | Admitting: Internal Medicine

## 2016-07-19 MED FILL — ATORVASTATIN 20 MG TABLET: 20 | 90 days supply | Qty: 90 | Fill #1

## 2016-07-19 MED FILL — TOPIRAMATE 100 MG TABLET: 100 | 90 days supply | Qty: 90 | Fill #2

## 2016-07-19 MED FILL — FLUoxetine HCL 20 MG CAPS: 20 | 90 days supply | Qty: 90 | Fill #0

## 2016-08-16 MED FILL — ESOMEPRAZOLE MAG DR 40 MG C: 40 | 90 days supply | Qty: 90 | Fill #1

## 2016-09-02 ENCOUNTER — Other Ambulatory Visit: Payer: Self-pay | Admitting: Internal Medicine

## 2016-09-02 MED FILL — BENAZEPRIL-HCTZ 20-12.5 MG: 20-12.5 | 90 days supply | Qty: 90 | Fill #0

## 2016-09-02 MED FILL — SYNTHROID 100 MCG TABLET: 100 | 90 days supply | Qty: 90 | Fill #0

## 2016-09-02 MED FILL — LARIN FE 1.5-30 TABLET: 1.5-30 | 84 days supply | Qty: 84 | Fill #2

## 2016-09-16 ENCOUNTER — Other Ambulatory Visit: Payer: Self-pay | Admitting: Internal Medicine

## 2016-09-16 DIAGNOSIS — Z1231 Encounter for screening mammogram for malignant neoplasm of breast: Secondary | ICD-10-CM

## 2016-09-27 ENCOUNTER — Other Ambulatory Visit: Payer: 59 | Admitting: Internal Medicine

## 2016-09-27 DIAGNOSIS — E785 Hyperlipidemia, unspecified: Secondary | ICD-10-CM

## 2016-09-27 DIAGNOSIS — R7302 Impaired glucose tolerance (oral): Secondary | ICD-10-CM | POA: Diagnosis not present

## 2016-09-27 DIAGNOSIS — E039 Hypothyroidism, unspecified: Secondary | ICD-10-CM | POA: Diagnosis not present

## 2016-09-28 LAB — HEMOGLOBIN A1C
HEMOGLOBIN A1C: 5.3 %{Hb} (ref <5.7)
Mean Plasma Glucose: 105 (calc)
eAG (mmol/L): 5.8 (calc)

## 2016-09-28 LAB — HEPATIC FUNCTION PANEL
AG Ratio: 1.6 (calc) (ref 1.0–2.5)
ALKALINE PHOSPHATASE (APISO): 77 U/L (ref 33–115)
ALT: 19 U/L (ref 6–29)
AST: 17 U/L (ref 10–30)
Albumin: 4.4 g/dL (ref 3.6–5.1)
BILIRUBIN DIRECT: 0.1 mg/dL (ref 0.0–0.2)
BILIRUBIN TOTAL: 0.3 mg/dL (ref 0.2–1.2)
Globulin: 2.8 g/dL (calc) (ref 1.9–3.7)
Indirect Bilirubin: 0.2 mg/dL (calc) (ref 0.2–1.2)
Total Protein: 7.2 g/dL (ref 6.1–8.1)

## 2016-09-28 LAB — LIPID PANEL
CHOLESTEROL: 167 mg/dL (ref <200)
HDL: 46 mg/dL — ABNORMAL LOW (ref >50)
LDL Cholesterol (Calc): 95 mg/dL (calc)
Non-HDL Cholesterol (Calc): 121 mg/dL (calc) (ref <130)
TRIGLYCERIDES: 155 mg/dL — AB (ref <150)
Total CHOL/HDL Ratio: 3.6 (calc) (ref <5.0)

## 2016-09-28 LAB — TSH: TSH: 0.85 m[IU]/L

## 2016-09-30 ENCOUNTER — Ambulatory Visit (INDEPENDENT_AMBULATORY_CARE_PROVIDER_SITE_OTHER): Payer: 59 | Admitting: Internal Medicine

## 2016-09-30 ENCOUNTER — Encounter: Payer: Self-pay | Admitting: Internal Medicine

## 2016-09-30 VITALS — BP 124/80 | HR 85 | Temp 98.2°F | Wt 231.0 lb

## 2016-09-30 DIAGNOSIS — E782 Mixed hyperlipidemia: Secondary | ICD-10-CM | POA: Diagnosis not present

## 2016-09-30 DIAGNOSIS — I10 Essential (primary) hypertension: Secondary | ICD-10-CM

## 2016-09-30 DIAGNOSIS — Z0000 Encounter for general adult medical examination without abnormal findings: Secondary | ICD-10-CM

## 2016-09-30 DIAGNOSIS — E8881 Metabolic syndrome: Secondary | ICD-10-CM

## 2016-09-30 DIAGNOSIS — E039 Hypothyroidism, unspecified: Secondary | ICD-10-CM

## 2016-09-30 DIAGNOSIS — R7302 Impaired glucose tolerance (oral): Secondary | ICD-10-CM

## 2016-09-30 DIAGNOSIS — Z6841 Body Mass Index (BMI) 40.0 and over, adult: Secondary | ICD-10-CM | POA: Diagnosis not present

## 2016-09-30 DIAGNOSIS — F32A Depression, unspecified: Secondary | ICD-10-CM

## 2016-09-30 DIAGNOSIS — F329 Major depressive disorder, single episode, unspecified: Secondary | ICD-10-CM | POA: Diagnosis not present

## 2016-09-30 DIAGNOSIS — Z8669 Personal history of other diseases of the nervous system and sense organs: Secondary | ICD-10-CM | POA: Diagnosis not present

## 2016-09-30 DIAGNOSIS — F419 Anxiety disorder, unspecified: Secondary | ICD-10-CM | POA: Diagnosis not present

## 2016-09-30 DIAGNOSIS — Z9889 Other specified postprocedural states: Secondary | ICD-10-CM

## 2016-10-02 NOTE — Patient Instructions (Signed)
It was a pleasure to see you today. Keep up the good work with weight loss. Continue same medications and return in 6 months. Have flu vaccine at work.

## 2016-10-02 NOTE — Progress Notes (Signed)
   Subjective:    Patient ID: Dana Washington, female    DOB: January 06, 1976, 41 y.o.   MRN: [id number]  HPI 41 year old female with history of hypertension, hyperlipidemia, hypothyroidism, impaired glucose tolerance in today for six-month recheck.  Doing well. Continues with Weight Watchers. Weight today is 231 pounds and was 239 pounds in March. She has been to [place] and enjoyed herself a great deal. Planning a trip to the [place] in a few months.    Hyperlipidemia is inherited from her parents.  History of depression treated with Cymbalta.  She first presented to this office December 2012 having moved from [place]. She is a vascular sonographer.  She has a history of migraine headaches.  Social history: Single, never married, no children. Her sister lives here. Her mother moved here a few years ago and is living with her. This is worked out very well. 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 She is getting more time off and she did initially when she started the job. Nonsmoker. Social alcohol consumption. Enjoys greyhound dogs.  Family history: Father with history of prostate cancer and hyperlipidemia. Mother with history of skin cancer and hyperlipidemia. Sister with history of migraine headaches. Patient says there is a family history of early onset breast cancer.  Patient had breast reduction surgery May 2010. Subsequently had bilateral carpal tunnel release February 2012. Lasix surgery 2008 4 myopia. History of Hashimoto's thyroiditis with resultant hypothyroidism. History of vitamin D deficiency. Benign lymph node biopsy February 2010. History of seroma left breast which was a result of her breast reduction surgery. Shoulder surgery by Dr. Theda Sers September 2014. She had a 20% rotator cuff tear and osteoarthritis of the before meals joint and had partial clavicle resection. She had a glenoid labral tear.  History of urticaria February 2018 treated with steroids  Zyrtec and Zantac. Symptoms resolved.    Review of Systems  Constitutional: Negative.   Neurological:       History of migraine headaches  All other systems reviewed and are negative.      Objective:   Physical Exam  Constitutional: She appears well-developed and well-nourished. No distress.  HENT:  Head: Normocephalic and atraumatic.  Right Ear: External ear normal.  Left Ear: External ear normal.  Mouth/Throat: Oropharynx is clear and moist.  Cardiovascular: Normal rate, regular rhythm, normal heart sounds and intact distal pulses.   No murmur heard. Pulmonary/Chest: Effort normal and breath sounds normal. No respiratory distress. She has no wheezes.  Breasts normal female with history of seroma left breast  Genitourinary:  Genitourinary Comments: Deferred to GYN physician  Skin: She is not diaphoretic.  Vitals reviewed.         Assessment & Plan:  Impaired glucose tolerance  Hypothyroidism with history of Hashimoto's thyroiditisHistory of breast reduction surgery  Impaired glucose tolerance  History of vitamin D deficiency  Essential hypertension  Morbid obesity  Metabolic syndrome  History of migraine headaches  History of breast reduction surgery  Plan: Continue same medications and return in 6 months. Continue Prozac for depression, metformin for glucose intolerance, benazepril HCTZ for hypertension, Lipitor, current dose of Synthroid is adequate, Topamax for migraine headaches as well as when necessary Esgic. continue Weight Watchers.

## 2016-10-11 MED FILL — TOPIRAMATE 100 MG TABLET: 100 | 90 days supply | Qty: 90 | Fill #3

## 2016-10-19 IMAGING — MG MM SCREEN MAMMOGRAM BILATERAL
4 series · 4 of 4 positions shown · non-contrast
Comparison: Previous exam(s).

CLINICAL DATA: Screening.

EXAM:
DIGITAL SCREENING BILATERAL MAMMOGRAM WITH CAD

[R CC]
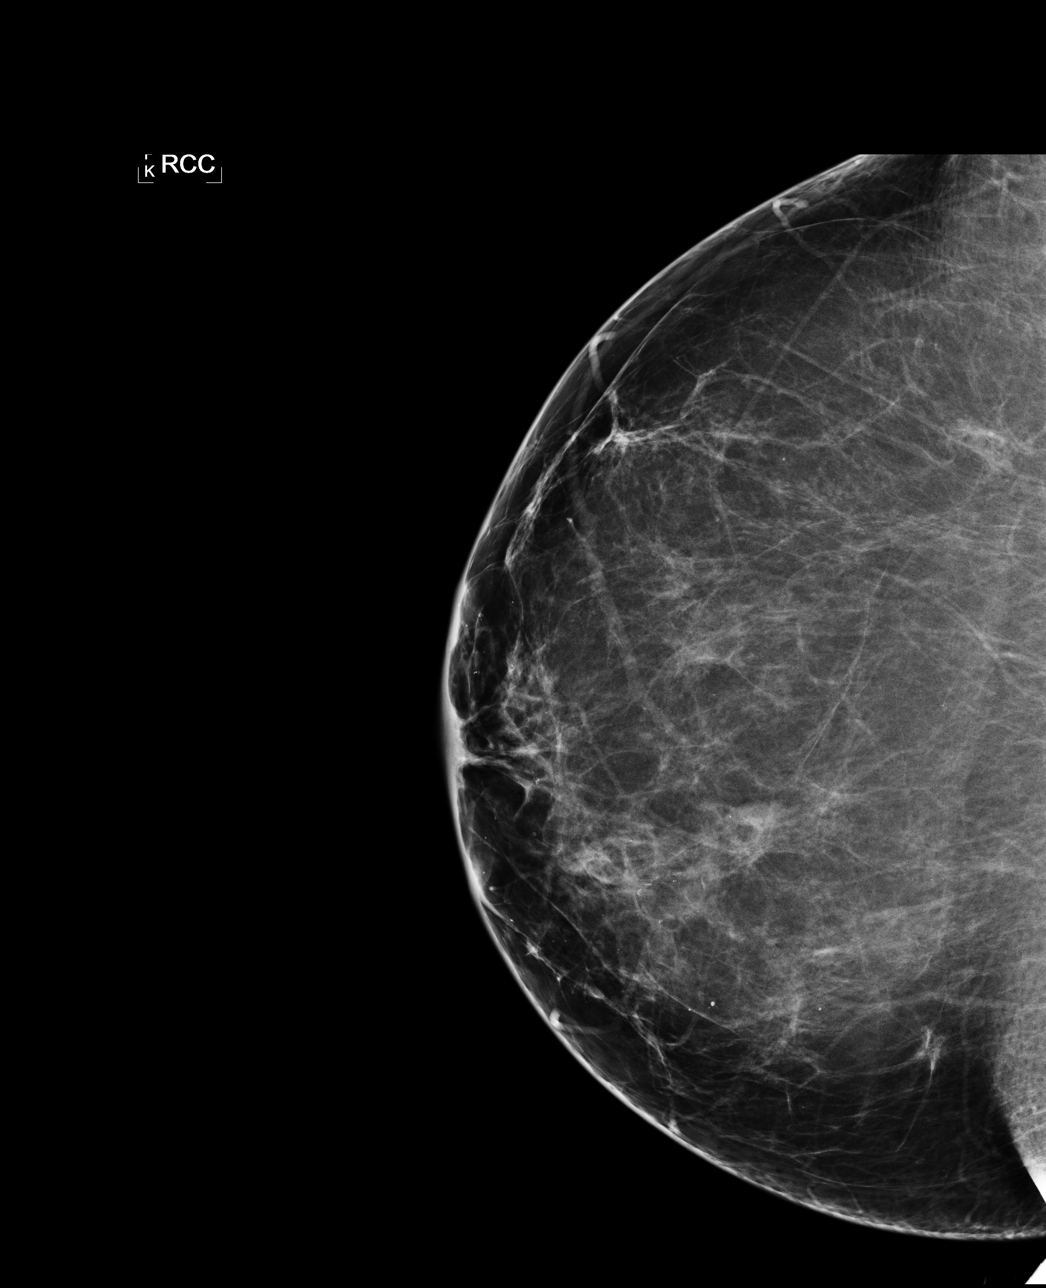

[L CC]
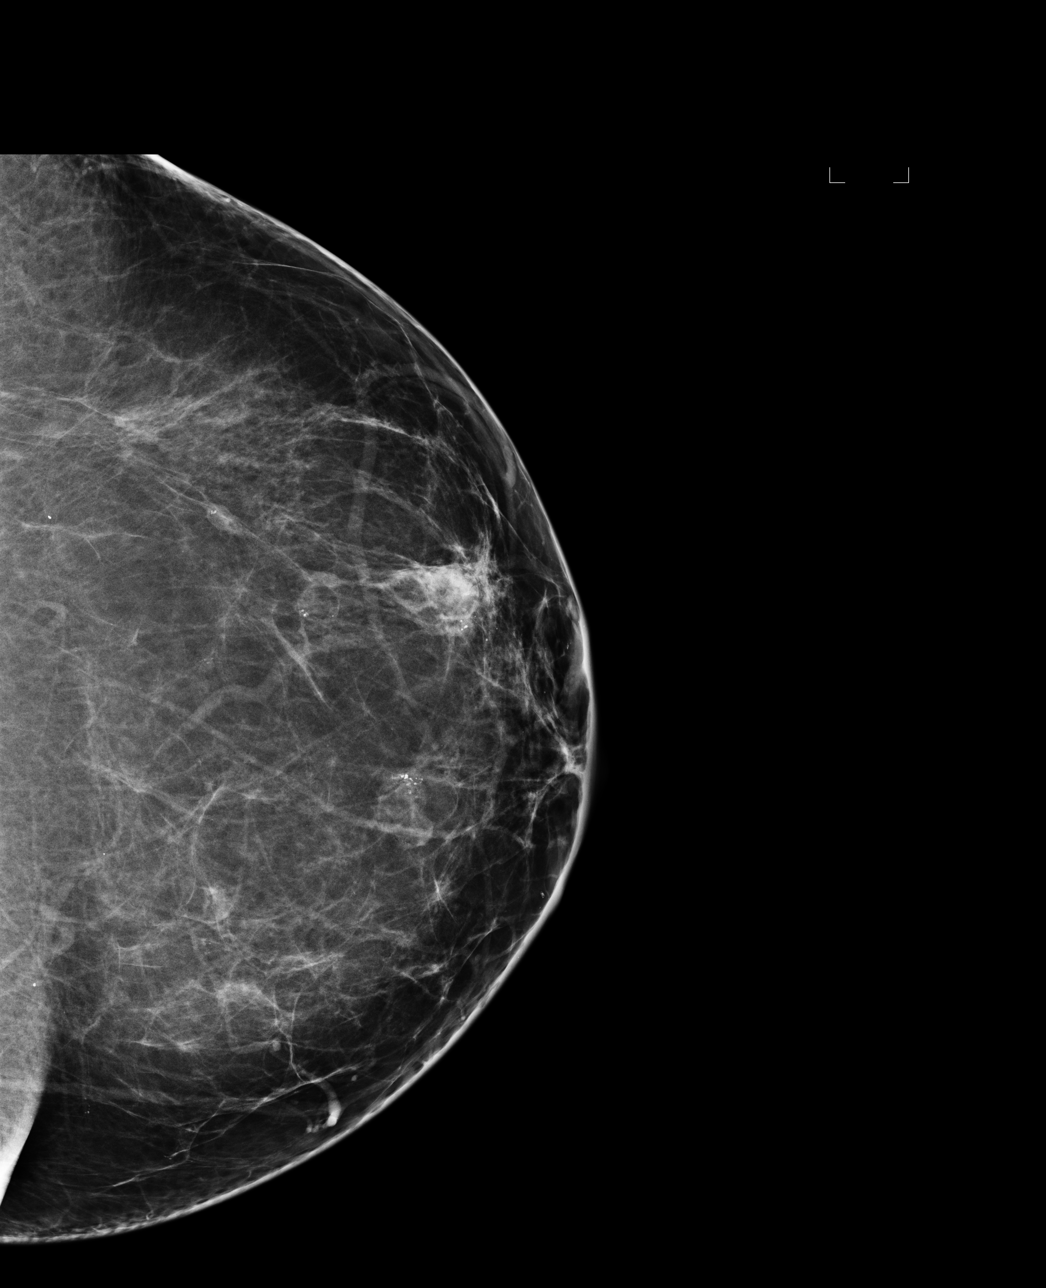

[L MLO]
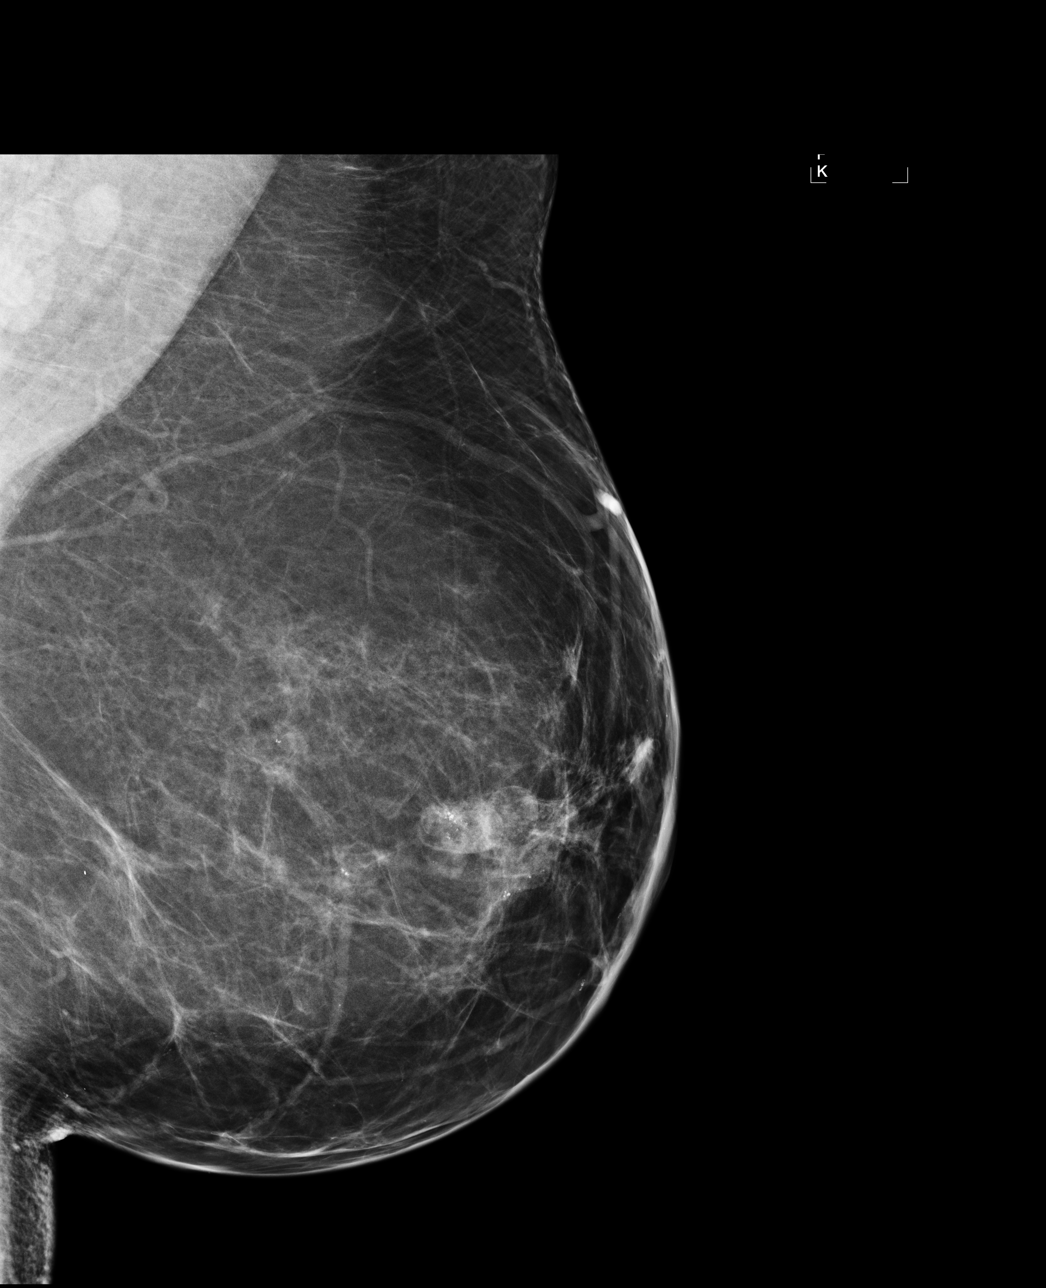

[R MLO]
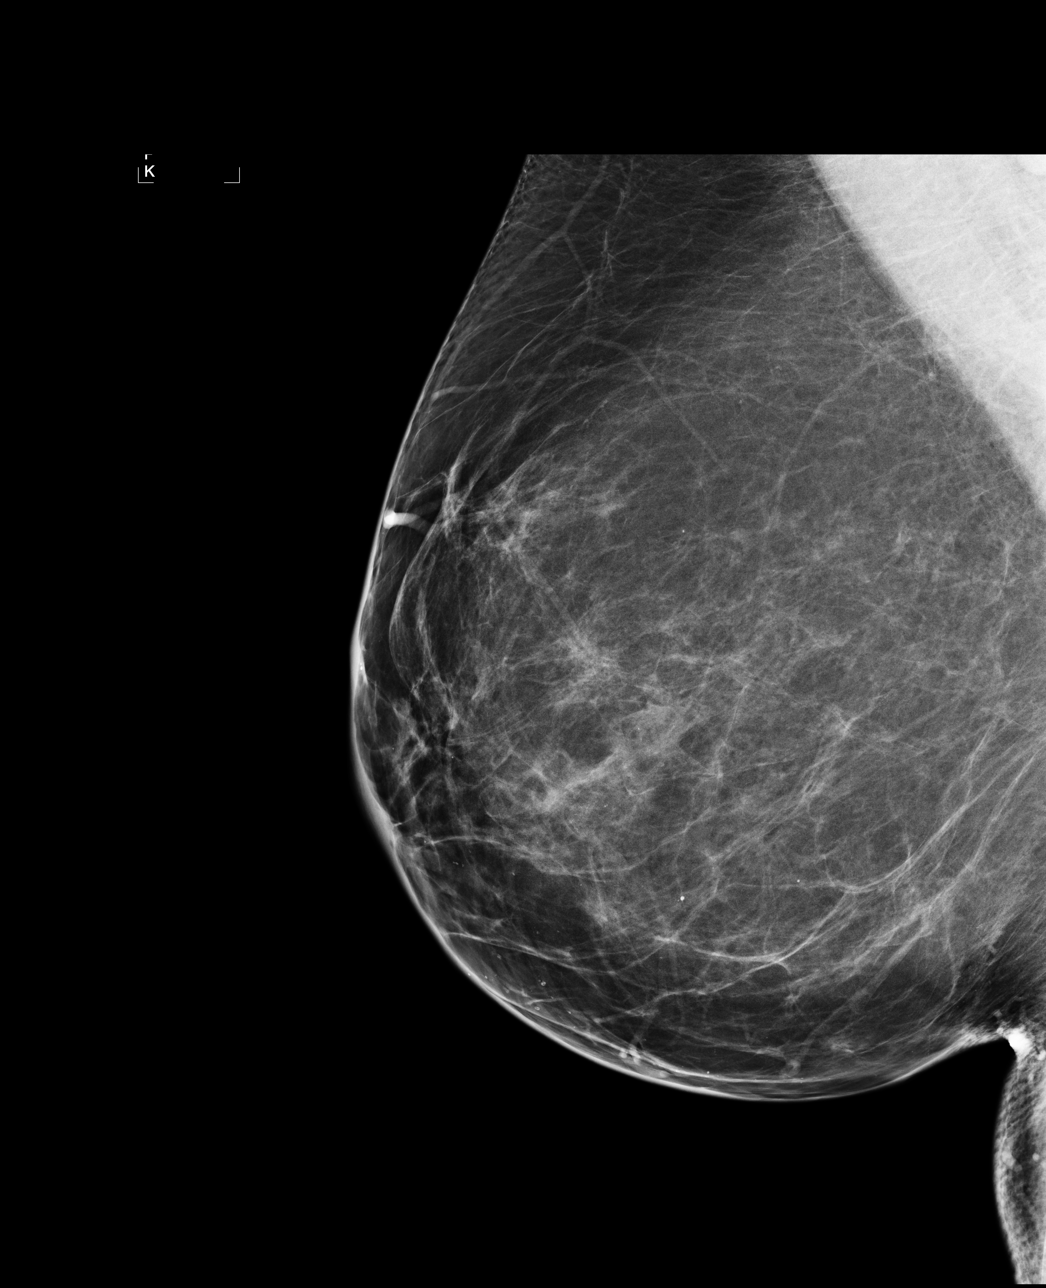

[4 of 4 positions shown; findings below may reference images not displayed]

ACR Breast Density Category b: There are scattered areas of
fibroglandular density.
FINDINGS: There are no findings suspicious for malignancy. Images were
processed with CAD.
IMPRESSION: No mammographic evidence of malignancy. A result letter of this
screening mammogram will be mailed directly to the patient.

RECOMMENDATION:
Screening mammogram in one year. (Code:AS-G-LCT)

BI-RADS CATEGORY  1: Negative.

## 2016-10-25 MED FILL — FLUoxetine HCL 20 MG CAPS: 20 | 90 days supply | Qty: 90 | Fill #1

## 2016-10-25 MED FILL — ATORVASTATIN 20 MG TABLET: 20 | 90 days supply | Qty: 90 | Fill #2

## 2016-11-02 ENCOUNTER — Ambulatory Visit
Admission: RE | Admit: 2016-11-02 | Discharge: 2016-11-02 | Disposition: A | Discharge status: Home / Self Care | Payer: 59 | Source: Ambulatory Visit | Attending: Internal Medicine | Admitting: Internal Medicine

## 2016-11-02 DIAGNOSIS — Z1231 Encounter for screening mammogram for malignant neoplasm of breast: Secondary | ICD-10-CM | POA: Diagnosis not present

## 2016-11-22 MED FILL — LARIN FE 1.5-30 TABLET: 1.5-30 | 84 days supply | Qty: 84 | Fill #3

## 2016-11-22 MED FILL — metFORMIN HCL 500 MG TABS: 500 | 90 days supply | Qty: 180 | Fill #1

## 2016-11-22 MED FILL — ESOMEPRAZOLE MAG DR 40 MG C: 40 | 90 days supply | Qty: 90 | Fill #2

## 2016-11-29 ENCOUNTER — Encounter: Payer: Self-pay | Admitting: Internal Medicine

## 2016-11-29 ENCOUNTER — Ambulatory Visit (INDEPENDENT_AMBULATORY_CARE_PROVIDER_SITE_OTHER): Payer: 59 | Admitting: Internal Medicine

## 2016-11-29 VITALS — BP 104/62 | HR 81 | Temp 97.6°F | Wt 218.0 lb

## 2016-11-29 DIAGNOSIS — F439 Reaction to severe stress, unspecified: Secondary | ICD-10-CM | POA: Diagnosis not present

## 2016-11-29 DIAGNOSIS — R21 Rash and other nonspecific skin eruption: Secondary | ICD-10-CM | POA: Diagnosis not present

## 2016-11-29 DIAGNOSIS — B009 Herpesviral infection, unspecified: Secondary | ICD-10-CM

## 2016-11-29 MED ORDER — ALPRAZOLAM 0.5 MG PO TABS: 0.5000 mg | ORAL_TABLET | Freq: Two times a day (BID) | ORAL | 0 refills | Status: DC | PRN

## 2016-11-29 MED ORDER — VALACYCLOVIR HCL 1 G PO TABS: 1000.0000 mg | ORAL_TABLET | Freq: Three times a day (TID) | ORAL | 0 refills | Status: DC

## 2016-11-29 MED ORDER — METHYLPREDNISOLONE ACETATE 80 MG/ML IJ SUSP
80.0000 mg | Freq: Once | INTRAMUSCULAR | Status: AC
  Administered 2016-11-29: 80 mg via INTRAMUSCULAR

## 2016-11-29 MED FILL — valACYclovir HCL 1 GM TABS: 1 | 7 days supply | Qty: 21 | Fill #0

## 2016-11-29 MED FILL — ALPRAZolam 0.5 MG TABS: 0.5 | 30 days supply | Qty: 60 | Fill #0

## 2016-11-29 NOTE — Patient Instructions (Signed)
Valtrex 1 g 3 times a day for 7 days.  Xanax twice a day as needed for anxiety.  Depo-Medrol 80 mg IM.  Call if not better next week or sooner if worse.

## 2016-11-29 NOTE — Progress Notes (Signed)
   Subjective:    Patient ID: Dana Washington, female    DOB: [birth year], 41 y.o.   MRN: [id number]  HPI 41 year old Female has been under stress with work situation recently.  She awakened this morning with rash on her left face.  Had pain in that area last evening.  In 2013 she had a very similar rash and has a picture to compare it to on her phone.  It was thought she had herpes zoster at the time and she was treated with Valtrex.  I think it is unlikely she has recurrent zoster.  I think is more likely she has recurrent herpes simplex.  She has no known food allergies.      Review of Systems complaining of burning and stinging area of rash    Objective:   Physical Exam  She has an erythematous macular rash along her left zygomatic arch.  This appears to be a discrete rash with borders and not cellulitis      Assessment & Plan:  Herpes simplex- I think recurrent zoster is unlikely.  Situational stress  Plan: Valtrex 1 g 3 times a day for 7 days.  Depo-Medrol 80 mg a.m.  Xanax 0.5 mg #60 one p.o. twice daily as needed anxiety

## 2016-12-02 ENCOUNTER — Telehealth: Payer: 59 | Admitting: Family

## 2016-12-02 DIAGNOSIS — J029 Acute pharyngitis, unspecified: Secondary | ICD-10-CM

## 2016-12-02 MED ORDER — BENZONATATE 100 MG PO CAPS: 100.0000 mg | ORAL_CAPSULE | Freq: Three times a day (TID) | ORAL | 0 refills | Status: DC | PRN

## 2016-12-02 MED ORDER — PREDNISONE 5 MG PO TABS: 5.0000 mg | ORAL_TABLET | ORAL | 0 refills | Status: DC

## 2016-12-02 NOTE — Progress Notes (Signed)
Thank you for the details you included in the comment boxes. Those details are very helpful in determining the best course of treatment for you and help [place] to provide the best care.  We are sorry that you are not feeling well.  Here is how we plan to help!  Based on your presentation I believe you most likely have A cough due to a virus.  This is called viral bronchitis and is best treated by rest, plenty of fluids and control of the cough.  You may use Ibuprofen or Tylenol as directed to help your symptoms.     In addition you may use A non-prescription cough medication called Mucinex DM: take 2 tablets every 12 hours. and A prescription cough medication called Tessalon Perles 100mg . You may take 1-2 capsules every 8 hours as needed for your cough.  Sterapred 5 mg dosepak  From your responses in the eVisit questionnaire you describe inflammation in the upper respiratory tract which is causing a significant cough.  This is commonly called Bronchitis and has four common causes:    Allergies  Viral Infections  Acid Reflux  Bacterial Infection Allergies, viruses and acid reflux are treated by controlling symptoms or eliminating the cause. An example might be a cough caused by taking certain blood pressure medications. You stop the cough by changing the medication. Another example might be a cough caused by acid reflux. Controlling the reflux helps control the cough.  USE OF BRONCHODILATOR ("RESCUE") INHALERS: There is a risk from using your bronchodilator too frequently.  The risk is that over-reliance on a medication which only relaxes the muscles surrounding the breathing tubes can reduce the effectiveness of medications prescribed to reduce swelling and congestion of the tubes themselves.  Although you feel brief relief from the bronchodilator inhaler, your asthma may actually be worsening with the tubes becoming more swollen and filled with mucus.  This can delay other crucial treatments, such  as oral steroid medications. If you need to use a bronchodilator inhaler daily, several times per day, you should discuss this with your provider.  There are probably better treatments that could be used to keep your asthma under control.     HOME CARE . Only take medications as instructed by your medical team. . Complete the entire course of an antibiotic. . Drink plenty of fluids and get plenty of rest. . Avoid close contacts especially the very young and the elderly . Cover your mouth if you cough or cough into your sleeve. . Always remember to wash your hands . A steam or ultrasonic humidifier can help congestion.   GET HELP RIGHT AWAY IF: . You develop worsening fever. . You become short of breath . You cough up blood. . Your symptoms persist after you have completed your treatment plan MAKE SURE YOU   Understand these instructions.  Will watch your condition.  Will get help right away if you are not doing well or get worse.  Your e-visit answers were reviewed by a board certified advanced clinical practitioner to complete your personal care plan.  Depending on the condition, your plan could have included both over the counter or prescription medications. If there is a problem please reply  once you have received a response from your provider. Your safety is important to [place].  If you have drug allergies check your prescription carefully.    You can use MyChart to ask questions about today's visit, request a non-urgent call back, or ask for a work  or school excuse for 24 hours related to this e-Visit. If it has been greater than 24 hours you will need to follow up with your provider, or enter a new e-Visit to address those concerns. You will get an e-mail in the next two days asking about your experience.  I hope that your e-visit has been valuable and will speed your recovery. Thank you for using e-visits.

## 2016-12-03 ENCOUNTER — Telehealth: Payer: 59 | Admitting: Family

## 2016-12-03 DIAGNOSIS — J208 Acute bronchitis due to other specified organisms: Secondary | ICD-10-CM | POA: Diagnosis not present

## 2016-12-03 NOTE — Progress Notes (Signed)
We are sorry that you are not feeling well.  Here is how we plan to help!  Based on your presentation I believe you most likely have A cough due to a virus.  This is called viral bronchitis and is best treated by rest, plenty of fluids and control of the cough.  You may use Ibuprofen or Tylenol as directed to help your symptoms.     In addition you may use A non-prescription cough medication called Robitussin DAC. Take 2 teaspoons every 8 hours or Delsym: take 2 teaspoons every 12 hours., A non-prescription cough medication called Mucinex DM: take 2 tablets every 12 hours. and A prescription cough medication called Tessalon Perles 100mg . You may take 1-2 capsules every 8 hours as needed for your cough.  Since you are feeling better and not having a fever we will continue with just the prednisone. If you become worse or do not improve let us know.   From your responses in the eVisit questionnaire you describe inflammation in the upper respiratory tract which is causing a significant cough.  This is commonly called Bronchitis and has four common causes:    Allergies  Viral Infections  Acid Reflux  Bacterial Infection Allergies, viruses and acid reflux are treated by controlling symptoms or eliminating the cause. An example might be a cough caused by taking certain blood pressure medications. You stop the cough by changing the medication. Another example might be a cough caused by acid reflux. Controlling the reflux helps control the cough.  USE OF BRONCHODILATOR ("RESCUE") INHALERS: There is a risk from using your bronchodilator too frequently.  The risk is that over-reliance on a medication which only relaxes the muscles surrounding the breathing tubes can reduce the effectiveness of medications prescribed to reduce swelling and congestion of the tubes themselves.  Although you feel brief relief from the bronchodilator inhaler, your asthma may actually be worsening with the tubes becoming more  swollen and filled with mucus.  This can delay other crucial treatments, such as oral steroid medications. If you need to use a bronchodilator inhaler daily, several times per day, you should discuss this with your provider.  There are probably better treatments that could be used to keep your asthma under control.     HOME CARE . Only take medications as instructed by your medical team. . Complete the entire course of an antibiotic. . Drink plenty of fluids and get plenty of rest. . Avoid close contacts especially the very young and the elderly . Cover your mouth if you cough or cough into your sleeve. . Always remember to wash your hands . A steam or ultrasonic humidifier can help congestion.   GET HELP RIGHT AWAY IF: . You develop worsening fever. . You become short of breath . You cough up blood. . Your symptoms persist after you have completed your treatment plan MAKE SURE YOU   Understand these instructions.  Will watch your condition.  Will get help right away if you are not doing well or get worse.  Your e-visit answers were reviewed by a board certified advanced clinical practitioner to complete your personal care plan.  Depending on the condition, your plan could have included both over the counter or prescription medications. If there is a problem please reply  once you have received a response from your provider. Your safety is important to [place].  If you have drug allergies check your prescription carefully.    You can use MyChart to ask questions about  today's visit, request a non-urgent call back, or ask for a work or school excuse for 24 hours related to this e-Visit. If it has been greater than 24 hours you will need to follow up with your provider, or enter a new e-Visit to address those concerns. You will get an e-mail in the next two days asking about your experience.  I hope that your e-visit has been valuable and will speed your recovery. Thank you for using  e-visits.

## 2016-12-06 ENCOUNTER — Ambulatory Visit (INDEPENDENT_AMBULATORY_CARE_PROVIDER_SITE_OTHER): Payer: 59 | Admitting: Internal Medicine

## 2016-12-06 ENCOUNTER — Encounter: Payer: Self-pay | Admitting: Internal Medicine

## 2016-12-06 VITALS — BP 100/60 | HR 102 | Temp 98.2°F | Ht 65.0 in | Wt 219.0 lb

## 2016-12-06 DIAGNOSIS — H6503 Acute serous otitis media, bilateral: Secondary | ICD-10-CM | POA: Diagnosis not present

## 2016-12-06 DIAGNOSIS — J029 Acute pharyngitis, unspecified: Secondary | ICD-10-CM | POA: Diagnosis not present

## 2016-12-06 DIAGNOSIS — J22 Unspecified acute lower respiratory infection: Secondary | ICD-10-CM | POA: Diagnosis not present

## 2016-12-06 LAB — POCT RAPID STREP A (OFFICE): Rapid Strep A Screen: NEGATIVE

## 2016-12-06 MED ORDER — LEVOFLOXACIN 500 MG PO TABS: 500.0000 mg | ORAL_TABLET | Freq: Every day | ORAL | 0 refills | Status: DC

## 2016-12-06 MED ORDER — FLUCONAZOLE 150 MG PO TABS: 150.0000 mg | ORAL_TABLET | Freq: Once | ORAL | 1 refills | Status: DC

## 2016-12-06 MED ORDER — HYDROCODONE-HOMATROPINE 5-1.5 MG/5ML PO SYRP: 5.0000 mL | ORAL_SOLUTION | Freq: Three times a day (TID) | ORAL | 0 refills | Status: DC | PRN

## 2016-12-06 MED FILL — FLUCONAZOLE 150 MG TABLET: 150 | 1 days supply | Qty: 1 | Fill #0

## 2016-12-06 MED FILL — levoFLOXacin 500 MG TABS: 500 | 10 days supply | Qty: 10 | Fill #0

## 2016-12-06 MED FILL — HYDROCODONE-HOMATROPINE SYR: 5-1.5 | 8 days supply | Qty: 120 | Fill #0

## 2016-12-06 NOTE — Progress Notes (Signed)
   Subjective:    Patient ID: Dana Washington, female    DOB: 1975/08/23, 41 y.o.   MRN: [id number]  HPI She was here on November 19 with lesion left face consistent with herpes simplex treated with Valtrex and improved.  However she developed a sore throat and laryngitis and was seen by ED visit on November 22.  Was given Tessalon Perles and advised to take Mucinex DM.  Was also given a Sterapred 5 mg Dosepak.  She call back the following day for another ER visit and was told to take Robitussin [facility] for Delsym.  Also was advised to take [employer]  She is still coughing and not feeling any better.  She spent Thanksgiving on the couch.  Cough is now becoming productive.  No fever or shaking chills.  Some sore throat.    Review of Systems see above     Objective:   Physical Exam Skin warm and dry.  Pharynx is injected without exudate.  Rapid strep screen negative.  TMs are full bilaterally.  Neck is supple without adenopathy.  Chest clear to auscultation.  She is coughing frequently in the office.  Lesion consistent with herpes simplex treated on November 19 has resolved.       Assessment & Plan:  Acute bronchitis  Bilateral serous otitis media  Plan: Hycodan 1 teaspoon p.o. every 8 hours as needed cough.  Levaquin 500 mg daily for 10 days.  Diflucan if needed for yeast infection.  Rest and drink plenty of fluids.

## 2016-12-06 NOTE — Patient Instructions (Signed)
Hycodan 1 teaspoon p.o. every 8 hours as needed cough.  Rest and drink plenty of fluids.  Levaquin 500 mg daily for 10 days.  Diflucan 150 mg tablet if needed for Candida vaginitis.

## 2016-12-14 ENCOUNTER — Other Ambulatory Visit: Payer: Self-pay | Admitting: Internal Medicine

## 2016-12-14 ENCOUNTER — Telehealth: Payer: Self-pay | Admitting: Internal Medicine

## 2016-12-14 MED FILL — BENAZEPRIL-HCTZ 20-12.5 MG: 20-12.5 | 90 days supply | Qty: 90 | Fill #0

## 2016-12-14 MED FILL — SYNTHROID 100 MCG TABLET: 100 | 90 days supply | Qty: 90 | Fill #0

## 2016-12-14 NOTE — Telephone Encounter (Signed)
Called in refill to [facility] for Synthroid and Lotensin HCT for #90 and no refills as patient has CPE in March with Dr. Renold Genta.  Patient is aware of this.  Dr. Renold Genta is out of town and has given verbal order for these refills.

## 2016-12-15 MED FILL — CLINDAMYCIN HCL 150 MG CAPS: 150 | 7 days supply | Qty: 28 | Fill #0

## 2016-12-28 ENCOUNTER — Other Ambulatory Visit: Payer: Self-pay | Admitting: Internal Medicine

## 2016-12-30 MED FILL — FLUCONAZOLE 150 MG TABLET: 150 | 1 days supply | Qty: 1 | Fill #1

## 2017-01-07 MED FILL — TOPIRAMATE 100 MG TABLET: 100 | 90 days supply | Qty: 90 | Fill #0

## 2017-01-10 DIAGNOSIS — Z113 Encounter for screening for infections with a predominantly sexual mode of transmission: Secondary | ICD-10-CM | POA: Diagnosis not present

## 2017-01-10 DIAGNOSIS — Z130 Encounter for screening for diseases of the blood and blood-forming organs and certain disorders involving the immune mechanism: Secondary | ICD-10-CM | POA: Diagnosis not present

## 2017-01-10 DIAGNOSIS — Z202 Contact with and (suspected) exposure to infections with a predominantly sexual mode of transmission: Secondary | ICD-10-CM | POA: Diagnosis not present

## 2017-01-10 DIAGNOSIS — Z3041 Encounter for surveillance of contraceptive pills: Secondary | ICD-10-CM | POA: Diagnosis not present

## 2017-01-10 DIAGNOSIS — Z1389 Encounter for screening for other disorder: Secondary | ICD-10-CM | POA: Diagnosis not present

## 2017-01-10 DIAGNOSIS — Z01419 Encounter for gynecological examination (general) (routine) without abnormal findings: Secondary | ICD-10-CM | POA: Diagnosis not present

## 2017-01-25 MED FILL — ATORVASTATIN 20 MG TABLET: 20 | 90 days supply | Qty: 90 | Fill #3

## 2017-01-25 MED FILL — FLUoxetine HCL 20 MG CAPS: 20 | 90 days supply | Qty: 90 | Fill #2

## 2017-01-27 MED FILL — PREDNISOLONE AC 1% EYE DROP: 1 | 50 days supply | Qty: 10 | Fill #0

## 2017-02-09 MED FILL — LARIN FE 1.5-30 TABLET: 1.5-30 | 84 days supply | Qty: 84 | Fill #0

## 2017-02-28 MED FILL — ESOMEPRAZOLE MAG DR 40 MG C: 40 | 90 days supply | Qty: 90 | Fill #3

## 2017-03-01 ENCOUNTER — Ambulatory Visit (INDEPENDENT_AMBULATORY_CARE_PROVIDER_SITE_OTHER): Payer: No Typology Code available for payment source | Admitting: Internal Medicine

## 2017-03-01 ENCOUNTER — Encounter: Payer: Self-pay | Admitting: Internal Medicine

## 2017-03-01 VITALS — BP 102/60 | HR 64 | Temp 98.1°F | Ht 65.0 in | Wt 222.0 lb

## 2017-03-01 DIAGNOSIS — R5383 Other fatigue: Secondary | ICD-10-CM | POA: Diagnosis not present

## 2017-03-01 DIAGNOSIS — D171 Benign lipomatous neoplasm of skin and subcutaneous tissue of trunk: Secondary | ICD-10-CM | POA: Diagnosis not present

## 2017-03-01 LAB — CBC WITH DIFFERENTIAL/PLATELET
BASOS PCT: 0.8 %
Basophils Absolute: 69 cells/uL (ref 0–200)
EOS ABS: 77 {cells}/uL (ref 15–500)
Eosinophils Relative: 0.9 %
HEMATOCRIT: 41.9 % (ref 35.0–45.0)
HEMOGLOBIN: 13.9 g/dL (ref 11.7–15.5)
Lymphs Abs: 2761 cells/uL (ref 850–3900)
MCH: 30.4 pg (ref 27.0–33.0)
MCHC: 33.2 g/dL (ref 32.0–36.0)
MCV: 91.7 fL (ref 80.0–100.0)
MPV: 9.6 fL (ref 7.5–12.5)
Monocytes Relative: 8.6 %
NEUTROS ABS: 4954 {cells}/uL (ref 1500–7800)
Neutrophils Relative %: 57.6 %
Platelets: 328 10*3/uL (ref 140–400)
RBC: 4.57 10*6/uL (ref 3.80–5.10)
RDW: 13.4 % (ref 11.0–15.0)
TOTAL LYMPHOCYTE: 32.1 %
WBC mixed population: 740 cells/uL (ref 200–950)
WBC: 8.6 10*3/uL (ref 3.8–10.8)

## 2017-03-01 LAB — COMPLETE METABOLIC PANEL WITH GFR
AG RATIO: 1.5 (calc) (ref 1.0–2.5)
ALBUMIN MSPROF: 4.1 g/dL (ref 3.6–5.1)
ALT: 11 U/L (ref 6–29)
AST: 15 U/L (ref 10–30)
Alkaline phosphatase (APISO): 59 U/L (ref 33–115)
BILIRUBIN TOTAL: 0.3 mg/dL (ref 0.2–1.2)
BUN / CREAT RATIO: 11 (calc) (ref 6–22)
BUN: 14 mg/dL (ref 7–25)
CHLORIDE: 105 mmol/L (ref 98–110)
CO2: 26 mmol/L (ref 20–32)
Calcium: 9.1 mg/dL (ref 8.6–10.2)
Creat: 1.27 mg/dL — ABNORMAL HIGH (ref 0.50–1.10)
GFR, Est African American: 60 mL/min/{1.73_m2} (ref >=60)
GFR, Est Non African American: 52 mL/min/{1.73_m2} — ABNORMAL LOW (ref >=60)
GLOBULIN: 2.8 g/dL (ref 1.9–3.7)
Glucose, Bld: 90 mg/dL (ref 65–99)
POTASSIUM: 4.3 mmol/L (ref 3.5–5.3)
SODIUM: 138 mmol/L (ref 135–146)
Total Protein: 6.9 g/dL (ref 6.1–8.1)

## 2017-03-01 MED ORDER — ALPRAZOLAM 0.5 MG PO TABS: 0.5000 mg | ORAL_TABLET | Freq: Two times a day (BID) | ORAL | 0 refills | Status: DC | PRN

## 2017-03-02 MED FILL — ALPRAZolam 0.5 MG TABS: 0.5 | 30 days supply | Qty: 60 | Fill #0

## 2017-03-02 NOTE — Progress Notes (Signed)
   Subjective:    Patient ID: Dana Washington, female    DOB: 1975-06-29, 42 y.o.   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  She is anxious about her health and is convinced herself that this might be a malignancy.  She is quite anxious.    Review of Systems     Objective:   Physical Exam I have not detected any lymphadenopathy in her axilla or neck or inguinal area.  She feels well.  No weight loss.  She has a nickel size lesion below her left rib cage area that I think is probably a small lipoma.  However I would like a surgeon to check this.  She needs significant reassurance       Assessment & Plan:  ?  Lipoma left upper quadrant  Plan: Asked her to see surgeon regarding further evaluation if necessary or just watchful waiting.

## 2017-03-02 NOTE — Patient Instructions (Addendum)
To see Dr. Georgette Dover regarding small mass left abdominal wall.  Labs drawn and pending.

## 2017-03-04 ENCOUNTER — Ambulatory Visit: Payer: Self-pay | Admitting: Surgery

## 2017-03-04 ENCOUNTER — Other Ambulatory Visit: Payer: Self-pay | Admitting: Surgery

## 2017-03-04 DIAGNOSIS — R1012 Left upper quadrant pain: Secondary | ICD-10-CM

## 2017-03-11 ENCOUNTER — Other Ambulatory Visit: Payer: Self-pay | Admitting: Internal Medicine

## 2017-03-11 MED FILL — SYNTHROID 100 MCG TABLET: 100 | 90 days supply | Qty: 90 | Fill #0

## 2017-03-11 MED FILL — BENAZEPRIL-HCTZ 20-12.5 MG: 20-12.5 | 90 days supply | Qty: 90 | Fill #0

## 2017-03-15 ENCOUNTER — Ambulatory Visit
Admission: RE | Admit: 2017-03-15 | Discharge: 2017-03-15 | Disposition: A | Discharge status: Home / Self Care | Payer: No Typology Code available for payment source | Source: Ambulatory Visit | Attending: Surgery | Admitting: Surgery

## 2017-03-15 DIAGNOSIS — R1012 Left upper quadrant pain: Secondary | ICD-10-CM

## 2017-03-15 MED ORDER — IOPAMIDOL (ISOVUE-300) INJECTION 61%
125.0000 mL | Freq: Once | INTRAVENOUS | Status: AC | PRN
  Administered 2017-03-15: 125 mL via INTRAVENOUS

## 2017-03-18 ENCOUNTER — Ambulatory Visit: Payer: Self-pay | Admitting: Surgery

## 2017-03-18 NOTE — H&P (View-Only) (Signed)
History of Present Illness  The patient is a 42 year old female who presents with abdominal pain. Referred by Dr. 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 The pain is constant with occasional intermittent exacerbation. She was examined and was noted to have a small palpable nodule (nickel-sized) below the left costal margin. No imaging. In 1999, she had enlarged lymph nodes biopsied from her left axilla. This took several attempts but she finally told that these were benign. The patient has a family history with multiple family members with breast cancer, but most of these family members were elderly.    The LUQ pain is sometimes sharp and does not always seem to be exacerbated by movement. The patient has lost almost 60 lbs in the last year with Weight Watchers and is much more active. She exercises regularly.   After the last visit, I obtained a CT scan.  CLINICAL DATA:  Left upper quadrant pain x3 weeks. Two lumps reported beneath the skin in the left upper quadrant.  EXAM: CT ABDOMEN AND PELVIS WITH CONTRAST  TECHNIQUE: Multidetector CT imaging of the abdomen and pelvis was performed using the standard protocol following bolus administration of intravenous contrast.  CONTRAST:  14mL ISOVUE-300 IOPAMIDOL (ISOVUE-300) INJECTION 61%  COMPARISON:  None.  FINDINGS: LOWER CHEST: Lung bases are clear. Included heart size is normal. No pericardial effusion.  HEPATOBILIARY: Liver and gallbladder are normal.  PANCREAS: Normal.  SPLEEN: Normal.  ADRENALS/URINARY TRACT: Kidneys are orthotopic, demonstrating symmetric enhancement. No nephrolithiasis, hydronephrosis or solid renal masses. Urinary bladder is partially distended and unremarkable. Normal adrenal glands.  STOMACH/BOWEL: The stomach, small and large bowel are normal  in course and caliber without inflammatory changes. Normal appendix. Moderate fecal retention within the descending colon through rectum.  VASCULAR/LYMPHATIC: Aortoiliac vessels are normal in course and caliber. No lymphadenopathy by CT size criteria.  REPRODUCTIVE: Normal.  OTHER: No intraperitoneal free fluid or free air.  MUSCULOSKELETAL: No acute osseous abnormality. No discrete soft tissue mass, adenopathy or fluid collection is identified to correspond with the patient's reported palpable lumps beneath the skin in the left upper quadrant.  IMPRESSION: 1. No acute solid nor hollow visceral organ abnormality. 2. Moderate fecal retention within the left colon. 3. No discrete soft tissue mass or fluid collection to account for the palpable lumps indicated by the patient in the left upper quadrant. No localizing markers are identified to indicate site of palpable lumps limiting assessment.   Electronically Signed   By: Ashley Royalty M.D.   On: 03/15/2017 23:57     Past Surgical History  Breast Biopsy  Left.  Foot Surgery  Left.  Mammoplasty; Reduction  Bilateral.  Shoulder Surgery  Right.    Diagnostic Studies History  Colonoscopy  never  Mammogram  within last year  Pap Smear  1-5 years ago    Allergies No Known Drug Allergies    Medication History  Xanax (0.5MG  Tablet, Oral) Active.  Topamax (25MG  Tablet, Oral) Active.  Benazepril-Hydrochlorothiazide (20-12.5MG  Tablet, Oral) Active.  Synthroid (100MCG Tablet, Oral) Active.  PROzac (20MG  Capsule, Oral) Active.  NexIUM (40MG  Capsule DR, Oral) Active.  Atorvastatin Calcium (20MG  Tablet, Oral) Active.  MiraLax (Oral) Active.  Transderm-Scop (1.5 MG) (1MG /3DAYS Patch 72HR, Transdermal) Active.  Medications Reconciled    Social History  Alcohol use  Occasional alcohol use.  No caffeine  use   No drug use   Tobacco use  Never smoker.    Family History  Anesthetic complications  Mother.  Cervical Cancer  Family Members In General.  Colon Cancer  Family Members In General.  Heart Disease  Father.  Hypertension  Father.  Malignant Neoplasm Of Pancreas  Father.  Migraine Headache  Sister.  Respiratory Condition  Family Members In General.  Thyroid problems  Mother.    Pregnancy / Birth History Age at menarche  5 years.  Contraceptive History  Oral contraceptives.  Gravida  0  Para  0  Regular periods     Other Problems  Anxiety Disorde  Gastroesophageal Reflux Disease   General anesthesia - complications   Hypercholesterolemia  Lump In Breast   Migraine Headache         Review of Systems   General Not Present- Appetite Loss, Chills, Fatigue, Fever, Night Sweats, Weight Gain and Weight Loss.  Skin Not Present- Change in Wart/Mole, Dryness, Hives, Jaundice, New Lesions, Non-Healing Wounds, Rash and Ulcer.  HEENT Not Present- Earache, Hearing Loss, Hoarseness, Nose Bleed, Oral Ulcers, Ringing in the Ears, Seasonal Allergies, Sinus Pain, Sore Throat, Visual Disturbances, Wears glasses/contact lenses and Yellow Eyes.  Respiratory Not Present- Bloody sputum, Chronic Cough, Difficulty Breathing, Snoring and Wheezing.  Breast Not Present- Breast Mass, Breast Pain, Nipple Discharge and Skin Changes.  Cardiovascular Not Present- Chest Pain, Difficulty Breathing Lying Down, Leg Cramps, Palpitations, Rapid Heart Rate, Shortness of Breath and Swelling of Extremities.  Gastrointestinal Not Present- Abdominal Pain, Bloating, Bloody Stool, Change in Bowel Habits, Chronic diarrhea, Constipation, Difficulty Swallowing, Excessive gas, Gets full quickly at meals, Hemorrhoids, Indigestion, Nausea, Rectal Pain and Vomiting.  Female Genitourinary Not Present- Frequency, Nocturia, Painful Urination, Pelvic Pain and Urgency.  Musculoskeletal Not Present- Back Pain, Joint Pain, Joint Stiffness, Muscle Pain, Muscle Weakness and Swelling of Extremities.  Neurological  Not Present- Decreased Memory, Fainting, Headaches, Numbness, Seizures, Tingling, Tremor, Trouble walking and Weakness.  Psychiatric Present- Anxiety. Not Present- Bipolar, Change in Sleep Pattern, Depression, Fearful and Frequent crying.  Endocrine Not Present- Cold Intolerance, Excessive Hunger, Hair Changes, Heat Intolerance, Hot flashes and New Diabetes.  Hematology Not Present- Blood Thinners, Easy Bruising, Excessive bleeding, Gland problems, HIV and Persistent Infections.    Vitals  Weight: 224 lb Height: 65.5in  Body Surface Area: 2.09  Body Mass Index: 36.71 kg/m  Temp.: 98.70F  Pulse: 91 (Regular)  P.OX: 97% (Room air)  BP: 120/78 (Sitting, Left Arm, Standard)              Physical Exam  The physical exam findings are as follows:  Note:WDWN in NAD  Eyes: Pupils equal, round; sclera anicteric  HENT: Oral mucosa moist; good dentition  Neck: No masses palpated, no thyromegaly  Lungs: CTA bilaterally; normal respiratory effort  CV: Regular rate and rhythm; no murmurs; extremities well-perfused with no edema  Abd: +bowel sounds, soft, moderate tenderness LUQ over and just below L costal margin  There is slight assymmetry to the subcutaneous adipose tissue L>R. There is a vague mass in the subcutaneous tissue that is about 5 cm across. No peritoneal signs. The mass does not enlarge with Valsalva maneuver  Skin: Warm, dry; no sign of jaundice  Psychiatric - alert and oriented x 4; calm mood and affect        Assessment & Plan  Subcutaneous lipoma - left abdominal wall 5 cm   Current Plans  Excision of subcutaneous lipoma - left abdominal wall.  The  surgical procedure has been discussed with the patient.  Potential risks, benefits, alternative treatments, and expected outcomes have been explained.  All of the patient's questions at this time have been answered.  The likelihood of reaching the patient's treatment goal is good.  The patient understand the proposed  surgical procedure and wishes to proceed.    The patient has a moderate-sized subcutaneous lipoma in this area that is becoming more noticeable as she loses weight.  The symptoms seem to be positional and muscular.  No other findings on CT scan.  Imogene Burn. Georgette Dover, MD, [facility]  General/ Trauma Surgery  03/18/2017 7:07 PM

## 2017-03-18 NOTE — H&P (Signed)
History of Present Illness  The patient is a 42 year old female who presents with abdominal pain. Referred by Dr. 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 The pain is constant with occasional intermittent exacerbation. She was examined and was noted to have a small palpable nodule (nickel-sized) below the left costal margin. No imaging. In 1999, she had enlarged lymph nodes biopsied from her left axilla. This took several attempts but she finally told that these were benign. The patient has a family history with multiple family members with breast cancer, but most of these family members were elderly.    The LUQ pain is sometimes sharp and does not always seem to be exacerbated by movement. The patient has lost almost 60 lbs in the last year with Weight Watchers and is much more active. She exercises regularly.   After the last visit, I obtained a CT scan.  CLINICAL DATA:  Left upper quadrant pain x3 weeks. Two lumps reported beneath the skin in the left upper quadrant.  EXAM: CT ABDOMEN AND PELVIS WITH CONTRAST  TECHNIQUE: Multidetector CT imaging of the abdomen and pelvis was performed using the standard protocol following bolus administration of intravenous contrast.  CONTRAST:  12mL ISOVUE-300 IOPAMIDOL (ISOVUE-300) INJECTION 61%  COMPARISON:  None.  FINDINGS: LOWER CHEST: Lung bases are clear. Included heart size is normal. No pericardial effusion.  HEPATOBILIARY: Liver and gallbladder are normal.  PANCREAS: Normal.  SPLEEN: Normal.  ADRENALS/URINARY TRACT: Kidneys are orthotopic, demonstrating symmetric enhancement. No nephrolithiasis, hydronephrosis or solid renal masses. Urinary bladder is partially distended and unremarkable. Normal adrenal glands.  STOMACH/BOWEL: The stomach, small and large bowel are normal  in course and caliber without inflammatory changes. Normal appendix. Moderate fecal retention within the descending colon through rectum.  VASCULAR/LYMPHATIC: Aortoiliac vessels are normal in course and caliber. No lymphadenopathy by CT size criteria.  REPRODUCTIVE: Normal.  OTHER: No intraperitoneal free fluid or free air.  MUSCULOSKELETAL: No acute osseous abnormality. No discrete soft tissue mass, adenopathy or fluid collection is identified to correspond with the patient's reported palpable lumps beneath the skin in the left upper quadrant.  IMPRESSION: 1. No acute solid nor hollow visceral organ abnormality. 2. Moderate fecal retention within the left colon. 3. No discrete soft tissue mass or fluid collection to account for the palpable lumps indicated by the patient in the left upper quadrant. No localizing markers are identified to indicate site of palpable lumps limiting assessment.   Electronically Signed   By: Ashley Royalty M.D.   On: 03/15/2017 23:57     Past Surgical History  Breast Biopsy  Left.  Foot Surgery  Left.  Mammoplasty; Reduction  Bilateral.  Shoulder Surgery  Right.    Diagnostic Studies History  Colonoscopy  never  Mammogram  within last year  Pap Smear  1-5 years ago    Allergies No Known Drug Allergies    Medication History  Xanax (0.5MG  Tablet, Oral) Active.  Topamax (25MG  Tablet, Oral) Active.  Benazepril-Hydrochlorothiazide (20-12.5MG  Tablet, Oral) Active.  Synthroid (100MCG Tablet, Oral) Active.  PROzac (20MG  Capsule, Oral) Active.  NexIUM (40MG  Capsule DR, Oral) Active.  Atorvastatin Calcium (20MG  Tablet, Oral) Active.  MiraLax (Oral) Active.  Transderm-Scop (1.5 MG) (1MG /3DAYS Patch 72HR, Transdermal) Active.  Medications Reconciled    Social History  Alcohol use  Occasional alcohol use.  No caffeine  use   No drug use   Tobacco use  Never smoker.    Family History  Anesthetic complications  Mother.  Cervical Cancer  Family Members In General.  Colon Cancer  Family Members In General.  Heart Disease  Father.  Hypertension  Father.  Malignant Neoplasm Of Pancreas  Father.  Migraine Headache  Sister.  Respiratory Condition  Family Members In General.  Thyroid problems  Mother.    Pregnancy / Birth History Age at menarche  44 years.  Contraceptive History  Oral contraceptives.  Gravida  0  Para  0  Regular periods     Other Problems  Anxiety Disorde  Gastroesophageal Reflux Disease   General anesthesia - complications   Hypercholesterolemia  Lump In Breast   Migraine Headache         Review of Systems   General Not Present- Appetite Loss, Chills, Fatigue, Fever, Night Sweats, Weight Gain and Weight Loss.  Skin Not Present- Change in Wart/Mole, Dryness, Hives, Jaundice, New Lesions, Non-Healing Wounds, Rash and Ulcer.  HEENT Not Present- Earache, Hearing Loss, Hoarseness, Nose Bleed, Oral Ulcers, Ringing in the Ears, Seasonal Allergies, Sinus Pain, Sore Throat, Visual Disturbances, Wears glasses/contact lenses and Yellow Eyes.  Respiratory Not Present- Bloody sputum, Chronic Cough, Difficulty Breathing, Snoring and Wheezing.  Breast Not Present- Breast Mass, Breast Pain, Nipple Discharge and Skin Changes.  Cardiovascular Not Present- Chest Pain, Difficulty Breathing Lying Down, Leg Cramps, Palpitations, Rapid Heart Rate, Shortness of Breath and Swelling of Extremities.  Gastrointestinal Not Present- Abdominal Pain, Bloating, Bloody Stool, Change in Bowel Habits, Chronic diarrhea, Constipation, Difficulty Swallowing, Excessive gas, Gets full quickly at meals, Hemorrhoids, Indigestion, Nausea, Rectal Pain and Vomiting.  Female Genitourinary Not Present- Frequency, Nocturia, Painful Urination, Pelvic Pain and Urgency.  Musculoskeletal Not Present- Back Pain, Joint Pain, Joint Stiffness, Muscle Pain, Muscle Weakness and Swelling of Extremities.  Neurological  Not Present- Decreased Memory, Fainting, Headaches, Numbness, Seizures, Tingling, Tremor, Trouble walking and Weakness.  Psychiatric Present- Anxiety. Not Present- Bipolar, Change in Sleep Pattern, Depression, Fearful and Frequent crying.  Endocrine Not Present- Cold Intolerance, Excessive Hunger, Hair Changes, Heat Intolerance, Hot flashes and New Diabetes.  Hematology Not Present- Blood Thinners, Easy Bruising, Excessive bleeding, Gland problems, HIV and Persistent Infections.    Vitals  Weight: 224 lb Height: 65.5in  Body Surface Area: 2.09  Body Mass Index: 36.71 kg/m  Temp.: 98.74F  Pulse: 91 (Regular)  P.OX: 97% (Room air)  BP: 120/78 (Sitting, Left Arm, Standard)              Physical Exam  The physical exam findings are as follows:  Note:WDWN in NAD  Eyes: Pupils equal, round; sclera anicteric  HENT: Oral mucosa moist; good dentition  Neck: No masses palpated, no thyromegaly  Lungs: CTA bilaterally; normal respiratory effort  CV: Regular rate and rhythm; no murmurs; extremities well-perfused with no edema  Abd: +bowel sounds, soft, moderate tenderness LUQ over and just below L costal margin  There is slight assymmetry to the subcutaneous adipose tissue L>R. There is a vague mass in the subcutaneous tissue that is about 5 cm across. No peritoneal signs. The mass does not enlarge with Valsalva maneuver  Skin: Warm, dry; no sign of jaundice  Psychiatric - alert and oriented x 4; calm mood and affect        Assessment & Plan  Subcutaneous lipoma - left abdominal wall 5 cm   Current Plans  Excision of subcutaneous lipoma - left abdominal wall.  The  surgical procedure has been discussed with the patient.  Potential risks, benefits, alternative treatments, and expected outcomes have been explained.  All of the patient's questions at this time have been answered.  The likelihood of reaching the patient's treatment goal is good.  The patient understand the proposed  surgical procedure and wishes to proceed.    The patient has a moderate-sized subcutaneous lipoma in this area that is becoming more noticeable as she loses weight.  The symptoms seem to be positional and muscular.  No other findings on CT scan.  Imogene Burn. Georgette Dover, MD, [facility]  General/ Trauma Surgery  03/18/2017 7:07 PM

## 2017-03-23 ENCOUNTER — Other Ambulatory Visit: Payer: Self-pay | Admitting: Internal Medicine

## 2017-03-23 DIAGNOSIS — E782 Mixed hyperlipidemia: Secondary | ICD-10-CM

## 2017-03-23 DIAGNOSIS — Z0000 Encounter for general adult medical examination without abnormal findings: Secondary | ICD-10-CM

## 2017-03-23 DIAGNOSIS — Z6841 Body Mass Index (BMI) 40.0 and over, adult: Secondary | ICD-10-CM

## 2017-03-23 DIAGNOSIS — E039 Hypothyroidism, unspecified: Secondary | ICD-10-CM

## 2017-03-23 DIAGNOSIS — Z1321 Encounter for screening for nutritional disorder: Secondary | ICD-10-CM

## 2017-03-23 DIAGNOSIS — E8881 Metabolic syndrome: Secondary | ICD-10-CM

## 2017-03-23 DIAGNOSIS — R7302 Impaired glucose tolerance (oral): Secondary | ICD-10-CM

## 2017-04-01 ENCOUNTER — Other Ambulatory Visit: Payer: No Typology Code available for payment source | Admitting: Internal Medicine

## 2017-04-01 DIAGNOSIS — Z1321 Encounter for screening for nutritional disorder: Secondary | ICD-10-CM

## 2017-04-01 DIAGNOSIS — E039 Hypothyroidism, unspecified: Secondary | ICD-10-CM

## 2017-04-01 DIAGNOSIS — E782 Mixed hyperlipidemia: Secondary | ICD-10-CM

## 2017-04-01 DIAGNOSIS — R7302 Impaired glucose tolerance (oral): Secondary | ICD-10-CM

## 2017-04-01 DIAGNOSIS — Z0000 Encounter for general adult medical examination without abnormal findings: Secondary | ICD-10-CM

## 2017-04-01 DIAGNOSIS — E8881 Metabolic syndrome: Secondary | ICD-10-CM

## 2017-04-01 DIAGNOSIS — Z6841 Body Mass Index (BMI) 40.0 and over, adult: Secondary | ICD-10-CM

## 2017-04-01 MED FILL — TRANSDERM-SCOP 1.5 MG/72HR: 1 | 12 days supply | Qty: 4 | Fill #0

## 2017-04-02 LAB — CBC WITH DIFFERENTIAL/PLATELET
BASOS ABS: 57 {cells}/uL (ref 0–200)
BASOS PCT: 0.8 %
EOS PCT: 1 %
Eosinophils Absolute: 71 cells/uL (ref 15–500)
HEMATOCRIT: 45.2 % — AB (ref 35.0–45.0)
Hemoglobin: 15 g/dL (ref 11.7–15.5)
LYMPHS ABS: 1363 {cells}/uL (ref 850–3900)
MCH: 30.5 pg (ref 27.0–33.0)
MCHC: 33.2 g/dL (ref 32.0–36.0)
MCV: 91.9 fL (ref 80.0–100.0)
MONOS PCT: 9.3 %
MPV: 9.4 fL (ref 7.5–12.5)
Neutro Abs: 4949 cells/uL (ref 1500–7800)
Neutrophils Relative %: 69.7 %
Platelets: 351 10*3/uL (ref 140–400)
RBC: 4.92 10*6/uL (ref 3.80–5.10)
RDW: 13 % (ref 11.0–15.0)
Total Lymphocyte: 19.2 %
WBC mixed population: 660 cells/uL (ref 200–950)
WBC: 7.1 10*3/uL (ref 3.8–10.8)

## 2017-04-02 LAB — LIPID PANEL
CHOL/HDL RATIO: 2.9 (calc) (ref <5.0)
Cholesterol: 170 mg/dL (ref <200)
HDL: 59 mg/dL (ref >50)
LDL Cholesterol (Calc): 90 mg/dL (calc)
NON-HDL CHOLESTEROL (CALC): 111 mg/dL (ref <130)
Triglycerides: 113 mg/dL (ref <150)

## 2017-04-02 LAB — COMPLETE METABOLIC PANEL WITH GFR
AG Ratio: 1.3 (calc) (ref 1.0–2.5)
ALT: 13 U/L (ref 6–29)
AST: 17 U/L (ref 10–30)
Albumin: 4.4 g/dL (ref 3.6–5.1)
Alkaline phosphatase (APISO): 67 U/L (ref 33–115)
BILIRUBIN TOTAL: 0.4 mg/dL (ref 0.2–1.2)
BUN: 14 mg/dL (ref 7–25)
CALCIUM: 9.5 mg/dL (ref 8.6–10.2)
CHLORIDE: 103 mmol/L (ref 98–110)
CO2: 27 mmol/L (ref 20–32)
Creat: 0.81 mg/dL (ref 0.50–1.10)
GFR, EST AFRICAN AMERICAN: 103 mL/min/{1.73_m2} (ref >=60)
GFR, EST NON AFRICAN AMERICAN: 89 mL/min/{1.73_m2} (ref >=60)
Globulin: 3.3 g/dL (calc) (ref 1.9–3.7)
Glucose, Bld: 94 mg/dL (ref 65–99)
Potassium: 4.3 mmol/L (ref 3.5–5.3)
Sodium: 138 mmol/L (ref 135–146)
TOTAL PROTEIN: 7.7 g/dL (ref 6.1–8.1)

## 2017-04-02 LAB — HEMOGLOBIN A1C
EAG (MMOL/L): 6 (calc)
HEMOGLOBIN A1C: 5.4 %{Hb} (ref <5.7)
Mean Plasma Glucose: 108 (calc)

## 2017-04-02 LAB — TSH: TSH: 1.4 mIU/L

## 2017-04-02 LAB — VITAMIN D 25 HYDROXY (VIT D DEFICIENCY, FRACTURES): VIT D 25 HYDROXY: 30 ng/mL (ref 30–100)

## 2017-04-04 ENCOUNTER — Encounter: Payer: 59 | Admitting: Internal Medicine

## 2017-04-11 ENCOUNTER — Encounter: Payer: Self-pay | Admitting: Internal Medicine

## 2017-04-11 ENCOUNTER — Encounter (HOSPITAL_BASED_OUTPATIENT_CLINIC_OR_DEPARTMENT_OTHER): Payer: Self-pay | Admitting: *Deleted

## 2017-04-11 ENCOUNTER — Other Ambulatory Visit: Payer: Self-pay

## 2017-04-11 ENCOUNTER — Ambulatory Visit (HOSPITAL_COMMUNITY)
Admission: RE | Admit: 2017-04-11 | Discharge: 2017-04-11 | Disposition: A | Discharge status: Home / Self Care | Payer: No Typology Code available for payment source | Source: Ambulatory Visit | Attending: Anesthesiology | Admitting: Anesthesiology

## 2017-04-11 ENCOUNTER — Ambulatory Visit (INDEPENDENT_AMBULATORY_CARE_PROVIDER_SITE_OTHER): Payer: No Typology Code available for payment source | Admitting: Internal Medicine

## 2017-04-11 ENCOUNTER — Other Ambulatory Visit (HOSPITAL_COMMUNITY): Payer: Self-pay | Admitting: Anesthesiology

## 2017-04-11 VITALS — BP 120/80 | HR 83 | Ht 63.0 in | Wt 231.0 lb

## 2017-04-11 DIAGNOSIS — F329 Major depressive disorder, single episode, unspecified: Secondary | ICD-10-CM | POA: Diagnosis not present

## 2017-04-11 DIAGNOSIS — E039 Hypothyroidism, unspecified: Secondary | ICD-10-CM | POA: Diagnosis not present

## 2017-04-11 DIAGNOSIS — E8881 Metabolic syndrome: Secondary | ICD-10-CM

## 2017-04-11 DIAGNOSIS — Z01818 Encounter for other preprocedural examination: Secondary | ICD-10-CM

## 2017-04-11 DIAGNOSIS — Z9889 Other specified postprocedural states: Secondary | ICD-10-CM | POA: Diagnosis not present

## 2017-04-11 DIAGNOSIS — F419 Anxiety disorder, unspecified: Secondary | ICD-10-CM

## 2017-04-11 DIAGNOSIS — E782 Mixed hyperlipidemia: Secondary | ICD-10-CM | POA: Diagnosis not present

## 2017-04-11 DIAGNOSIS — Z0000 Encounter for general adult medical examination without abnormal findings: Secondary | ICD-10-CM | POA: Diagnosis not present

## 2017-04-11 DIAGNOSIS — E119 Type 2 diabetes mellitus without complications: Secondary | ICD-10-CM | POA: Insufficient documentation

## 2017-04-11 DIAGNOSIS — R7302 Impaired glucose tolerance (oral): Secondary | ICD-10-CM

## 2017-04-11 DIAGNOSIS — I10 Essential (primary) hypertension: Secondary | ICD-10-CM

## 2017-04-11 DIAGNOSIS — Z6841 Body Mass Index (BMI) 40.0 and over, adult: Secondary | ICD-10-CM

## 2017-04-11 DIAGNOSIS — Z8669 Personal history of other diseases of the nervous system and sense organs: Secondary | ICD-10-CM

## 2017-04-11 DIAGNOSIS — Z01810 Encounter for preprocedural cardiovascular examination: Secondary | ICD-10-CM | POA: Diagnosis not present

## 2017-04-11 DIAGNOSIS — D171 Benign lipomatous neoplasm of skin and subcutaneous tissue of trunk: Secondary | ICD-10-CM

## 2017-04-11 DIAGNOSIS — F32A Depression, unspecified: Secondary | ICD-10-CM

## 2017-04-11 LAB — POCT URINALYSIS DIPSTICK
Appearance: NORMAL
Bilirubin, UA: NEGATIVE
Blood, UA: NEGATIVE
GLUCOSE UA: NEGATIVE
Ketones, UA: NEGATIVE
Leukocytes, UA: NEGATIVE
NITRITE UA: NEGATIVE
ODOR: NORMAL
PROTEIN UA: NEGATIVE
Spec Grav, UA: 1.015 (ref 1.010–1.025)
Urobilinogen, UA: 0.2 E.U./dL
pH, UA: 6.5 (ref 5.0–8.0)

## 2017-04-11 NOTE — Progress Notes (Signed)
Subjective:    Patient ID: Dana Washington, female    DOB: 1975-07-28, 42 y.o.   MRN: [id number]  HPI 42 year old Female in today for health maintenance exam and evaluation of medical issues.  She is scheduled for an excision of a subcutaneous lipoma left upper abdominal wall by Dr. Georgette Dover on April 4.  She had a CT done on March 5 of abdomen and pelvis showing moderate fecal retention within the left colon but no acute solid or hollow visceral organ abnormality.  No discrete soft tissue mass noted. I saw her on February 19 and thought she had a small nickel sized nodule below the left costal margin.  She was worried about this and I referred her to Dr. Georgette Dover for evaluation.  Suspected this might be a small lipoma. She has a history of essential hypertension, hyperlipidemia, obesity, metabolic syndrome, impaired glucose tolerance, migraine headaches and hypothyroidism.  She has a history of depression treated with Cymbalta.  Social history: Single, never married.  No children.  Sister lives here.  Her mother has moved here and is living with her.  This is worked out very well.  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  Non-smoker.  Social alcohol consumption.  Enjoys dogs.  Family history: Father with prostate cancer and hyperlipidemia.  Mother with history of skin cancer and hyperlipidemia.  Sister with history of migraine headaches.  Patient says there is a family history of early onset breast cancer.  Patient had breast reduction surgery May 2010.  Subsequently had bilateral carpal tunnel release February 2012.  Lasik surgery 2008 for myopia.  History of Hashimoto's thyroiditis with resultant hypothyroidism.  History of vitamin D deficiency.  Benign lymph node biopsy February 2010.  History of seroma left breast which was a result of her breast reduction surgery.  Shoulder surgery by Dr. Theda Sers September 2014.  She had a 20% rotator cuff tear and osteoarthritis of the AC joint.   Had partial clavicle resection.  She had a glenoid labral tear.  Episode of urticaria February 2018 treated with steroids, Zyrtec, and Zantac.  Symptoms resolved.  Recently restarted weight watchers after returning from vacation.  Weight in March 18 was 239 and is now 231 pounds.  Prior to going on vacation her weight was 222 pounds.  At one point in 2017 she weighed 266 pounds.  Review of Systems  Constitutional: Negative.   Respiratory: Negative.   Cardiovascular: Negative.   Gastrointestinal: Negative.   Genitourinary:       She is on oral contraceptives. She is sexually active..  Neurological: Negative.   Psychiatric/Behavioral: Negative.   Complains of a vague discomfort in her left upper abdomen. Patient  Objective:   Physical Exam  Constitutional: She is oriented to person, place, and time. She appears well-developed and well-nourished.  HENT:  Head: Normocephalic and atraumatic.  Left Ear: External ear normal.  Eyes: Pupils are equal, round, and reactive to light. Conjunctivae and EOM are normal. Right eye exhibits no discharge. Left eye exhibits no discharge.  Neck: Neck supple. No JVD present. No thyromegaly present.  Cardiovascular: Normal rate, regular rhythm, normal heart sounds and intact distal pulses.  No murmur heard. Pulmonary/Chest: Effort normal and breath sounds normal. No respiratory distress. She has no wheezes. She has no rales.  Breasts normal female without masses  Abdominal: Soft. Bowel sounds are normal. She exhibits no distension and no mass. There is no tenderness. There is no rebound and no guarding.  Genitourinary:  Genitourinary  Comments: Deferred to GYN physician  Musculoskeletal: She exhibits no edema.  Lymphadenopathy:    She has no cervical adenopathy.  Neurological: She is alert and oriented to person, place, and time. She has normal reflexes. No cranial nerve deficit. Coordination normal.  Skin: Skin is warm and dry. She is not diaphoretic.    Psychiatric: She has a normal mood and affect. Her behavior is normal. Judgment and thought content normal.  Vitals reviewed.         Assessment & Plan:  Her labs are reviewed and are entirely within normal limits including hemoglobin A1c, TSH lipid panel C met and CBC with differential.  Vitamin D is low normal at 30.  Recommend 2000 units vitamin D3 daily.  She is scheduled for excision of left subcutaneous abdominal mass on April 4.  History of migraine headaches stable with Topamax and uses as needed Fioricet.  Has Xanax for anxiety.  She is on metformin for impaired glucose tolerance  She takes Lipitor for hyperlipidemia and lipid panel was normal.  Takes Nexium for GE reflux  On Prozac for anxiety depression  Is on levothyroxine 0.1 mg daily for hypothyroidism.  Plan: Surgery plan for April  4.  I will follow-up with her in 6 months or sooner if necessary.

## 2017-04-11 NOTE — Patient Instructions (Signed)
It was a pleasure to see you today.  Continue same medications and follow-up in 6 months 

## 2017-04-14 ENCOUNTER — Other Ambulatory Visit: Payer: Self-pay

## 2017-04-14 ENCOUNTER — Encounter (HOSPITAL_BASED_OUTPATIENT_CLINIC_OR_DEPARTMENT_OTHER): Payer: Self-pay | Admitting: Anesthesiology

## 2017-04-14 ENCOUNTER — Ambulatory Visit (HOSPITAL_BASED_OUTPATIENT_CLINIC_OR_DEPARTMENT_OTHER): Payer: No Typology Code available for payment source | Admitting: Anesthesiology

## 2017-04-14 ENCOUNTER — Encounter (HOSPITAL_BASED_OUTPATIENT_CLINIC_OR_DEPARTMENT_OTHER)
Admission: RE | Disposition: A | Discharge status: Home / Self Care | Payer: Self-pay | Source: Ambulatory Visit | Attending: Surgery

## 2017-04-14 ENCOUNTER — Ambulatory Visit (HOSPITAL_BASED_OUTPATIENT_CLINIC_OR_DEPARTMENT_OTHER)
Admission: RE | Admit: 2017-04-14 | Discharge: 2017-04-14 | Disposition: A | Discharge status: Home / Self Care | Payer: No Typology Code available for payment source | Source: Ambulatory Visit | Attending: Surgery | Admitting: Surgery

## 2017-04-14 DIAGNOSIS — I10 Essential (primary) hypertension: Secondary | ICD-10-CM | POA: Insufficient documentation

## 2017-04-14 DIAGNOSIS — E063 Autoimmune thyroiditis: Secondary | ICD-10-CM | POA: Diagnosis not present

## 2017-04-14 DIAGNOSIS — F418 Other specified anxiety disorders: Secondary | ICD-10-CM | POA: Insufficient documentation

## 2017-04-14 DIAGNOSIS — Z79899 Other long term (current) drug therapy: Secondary | ICD-10-CM | POA: Diagnosis not present

## 2017-04-14 DIAGNOSIS — E7800 Pure hypercholesterolemia, unspecified: Secondary | ICD-10-CM | POA: Insufficient documentation

## 2017-04-14 DIAGNOSIS — K219 Gastro-esophageal reflux disease without esophagitis: Secondary | ICD-10-CM | POA: Insufficient documentation

## 2017-04-14 DIAGNOSIS — D171 Benign lipomatous neoplasm of skin and subcutaneous tissue of trunk: Secondary | ICD-10-CM | POA: Diagnosis not present

## 2017-04-14 HISTORY — DX: Type 2 diabetes mellitus without complications: E11.9

## 2017-04-14 HISTORY — PX: EXCISION MASS ABDOMINAL: SHX6701

## 2017-04-14 HISTORY — DX: Anxiety disorder, unspecified: F41.9

## 2017-04-14 SURGERY — EXCISION, MASS, TORSO
Anesthesia: General | Site: Abdomen

## 2017-04-14 MED ORDER — MIDAZOLAM HCL 2 MG/2ML IJ SOLN: 1.0000 mg | INTRAMUSCULAR | Status: DC | PRN

## 2017-04-14 MED ORDER — DEXAMETHASONE SODIUM PHOSPHATE 10 MG/ML IJ SOLN
INTRAMUSCULAR | Status: AC
  Filled 2017-04-14: qty 1

## 2017-04-14 MED ORDER — HYDROCODONE-ACETAMINOPHEN 5-325 MG PO TABS: 1.0000 | ORAL_TABLET | Freq: Four times a day (QID) | ORAL | 0 refills | Status: DC | PRN

## 2017-04-14 MED ORDER — SCOPOLAMINE 1 MG/3DAYS TD PT72: 1.0000 | MEDICATED_PATCH | TRANSDERMAL | Status: DC

## 2017-04-14 MED ORDER — ONDANSETRON HCL 4 MG/2ML IJ SOLN
INTRAMUSCULAR | Status: AC
  Filled 2017-04-14: qty 2

## 2017-04-14 MED ORDER — FENTANYL CITRATE (PF) 100 MCG/2ML IJ SOLN
INTRAMUSCULAR | Status: DC | PRN
  Administered 2017-04-14: 25 ug via INTRAVENOUS
  Administered 2017-04-14: 100 ug via INTRAVENOUS

## 2017-04-14 MED ORDER — LACTATED RINGERS IV SOLN
INTRAVENOUS | Status: DC
  Administered 2017-04-14: 08:00:00 via INTRAVENOUS

## 2017-04-14 MED ORDER — BUPIVACAINE-EPINEPHRINE 0.25% -1:200000 IJ SOLN
INTRAMUSCULAR | Status: DC | PRN
  Administered 2017-04-14: 10 mL

## 2017-04-14 MED ORDER — PROMETHAZINE HCL 25 MG/ML IJ SOLN: 6.2500 mg | INTRAMUSCULAR | Status: DC | PRN

## 2017-04-14 MED ORDER — DEXAMETHASONE SODIUM PHOSPHATE 4 MG/ML IJ SOLN
INTRAMUSCULAR | Status: DC | PRN
  Administered 2017-04-14: 10 mg via INTRAVENOUS

## 2017-04-14 MED ORDER — PROPOFOL 10 MG/ML IV BOLUS
INTRAVENOUS | Status: DC | PRN
  Administered 2017-04-14: 200 mg via INTRAVENOUS

## 2017-04-14 MED ORDER — MIDAZOLAM HCL 5 MG/5ML IJ SOLN
INTRAMUSCULAR | Status: DC | PRN
  Administered 2017-04-14: 2 mg via INTRAVENOUS

## 2017-04-14 MED ORDER — CHLORHEXIDINE GLUCONATE CLOTH 2 % EX PADS: 6.0000 | MEDICATED_PAD | Freq: Once | CUTANEOUS | Status: DC

## 2017-04-14 MED ORDER — SCOPOLAMINE 1 MG/3DAYS TD PT72
1.0000 | MEDICATED_PATCH | Freq: Once | TRANSDERMAL | Status: DC | PRN
  Administered 2017-04-14: 1.5 mg via TRANSDERMAL

## 2017-04-14 MED ORDER — SCOPOLAMINE 1 MG/3DAYS TD PT72
MEDICATED_PATCH | TRANSDERMAL | Status: AC
  Filled 2017-04-14: qty 1

## 2017-04-14 MED ORDER — FENTANYL CITRATE (PF) 100 MCG/2ML IJ SOLN
25.0000 ug | INTRAMUSCULAR | Status: DC | PRN
  Administered 2017-04-14: 50 ug via INTRAVENOUS

## 2017-04-14 MED ORDER — CEFAZOLIN SODIUM-DEXTROSE 2-4 GM/100ML-% IV SOLN
INTRAVENOUS | Status: AC
  Filled 2017-04-14: qty 100

## 2017-04-14 MED ORDER — FENTANYL CITRATE (PF) 100 MCG/2ML IJ SOLN: 50.0000 ug | INTRAMUSCULAR | Status: DC | PRN

## 2017-04-14 MED ORDER — BUPIVACAINE-EPINEPHRINE (PF) 0.25% -1:200000 IJ SOLN
INTRAMUSCULAR | Status: AC
  Filled 2017-04-14: qty 30

## 2017-04-14 MED ORDER — FENTANYL CITRATE (PF) 100 MCG/2ML IJ SOLN
INTRAMUSCULAR | Status: AC
  Filled 2017-04-14: qty 2

## 2017-04-14 MED ORDER — EPHEDRINE SULFATE 50 MG/ML IJ SOLN
INTRAMUSCULAR | Status: DC | PRN
  Administered 2017-04-14: 25 mg via INTRAVENOUS

## 2017-04-14 MED ORDER — CEFAZOLIN SODIUM-DEXTROSE 2-4 GM/100ML-% IV SOLN
2.0000 g | INTRAVENOUS | Status: AC
  Administered 2017-04-14: 2 g via INTRAVENOUS

## 2017-04-14 MED ORDER — LIDOCAINE HCL (CARDIAC) 20 MG/ML IV SOLN
INTRAVENOUS | Status: DC | PRN
  Administered 2017-04-14: 30 mg via INTRAVENOUS

## 2017-04-14 MED ORDER — EPHEDRINE 5 MG/ML INJ
INTRAVENOUS | Status: AC
  Filled 2017-04-14: qty 10

## 2017-04-14 MED ORDER — PROPOFOL 10 MG/ML IV BOLUS
INTRAVENOUS | Status: AC
  Filled 2017-04-14: qty 20

## 2017-04-14 MED ORDER — LIDOCAINE HCL (CARDIAC) 20 MG/ML IV SOLN
INTRAVENOUS | Status: AC
  Filled 2017-04-14: qty 5

## 2017-04-14 MED ORDER — MIDAZOLAM HCL 2 MG/2ML IJ SOLN
INTRAMUSCULAR | Status: AC
  Filled 2017-04-14: qty 2

## 2017-04-14 MED ORDER — ONDANSETRON HCL 4 MG/2ML IJ SOLN
INTRAMUSCULAR | Status: DC | PRN
  Administered 2017-04-14: 4 mg via INTRAVENOUS

## 2017-04-14 MED ORDER — 0.9 % SODIUM CHLORIDE (POUR BTL) OPTIME
TOPICAL | Status: DC | PRN
  Administered 2017-04-14: 100 mL

## 2017-04-14 MED FILL — TOPIRAMATE 100 MG TABS: 100 | 90 days supply | Qty: 90 | Fill #1

## 2017-04-14 MED FILL — HYDROCODON-APAP 5-325: 5-325 | 3 days supply | Qty: 12 | Fill #0

## 2017-04-14 SURGICAL SUPPLY — 41 items
BENZOIN TINCTURE PRP APPL 2/3 (GAUZE/BANDAGES/DRESSINGS) ×2 IMPLANT
BLADE CLIPPER SURG (BLADE) IMPLANT
BLADE SURG 15 STRL LF DISP TIS (BLADE) ×1 IMPLANT
BLADE SURG 15 STRL SS (BLADE) ×1
CANISTER SUCT 1200ML W/VALVE (MISCELLANEOUS) IMPLANT
CHLORAPREP W/TINT 26ML (MISCELLANEOUS) ×2 IMPLANT
COVER BACK TABLE 60X90IN (DRAPES) ×2 IMPLANT
COVER MAYO STAND STRL (DRAPES) ×2 IMPLANT
DECANTER SPIKE VIAL GLASS SM (MISCELLANEOUS) IMPLANT
DRAPE LAPAROTOMY 100X72 PEDS (DRAPES) ×2 IMPLANT
DRAPE UTILITY XL STRL (DRAPES) ×2 IMPLANT
DRSG TEGADERM 2-3/8X2-3/4 SM (GAUZE/BANDAGES/DRESSINGS) IMPLANT
DRSG TEGADERM 4X4.75 (GAUZE/BANDAGES/DRESSINGS) ×2 IMPLANT
ELECT COATED BLADE 2.86 ST (ELECTRODE) ×2 IMPLANT
ELECT REM PT RETURN 9FT ADLT (ELECTROSURGICAL) ×2
ELECTRODE REM PT RTRN 9FT ADLT (ELECTROSURGICAL) ×1 IMPLANT
GAUZE SPONGE 4X4 12PLY STRL LF (GAUZE/BANDAGES/DRESSINGS) ×2 IMPLANT
GLOVE BIO SURGEON STRL SZ 6.5 (GLOVE) ×2 IMPLANT
GLOVE BIO SURGEON STRL SZ7 (GLOVE) ×2 IMPLANT
GLOVE BIOGEL PI IND STRL 7.5 (GLOVE) ×2 IMPLANT
GLOVE BIOGEL PI INDICATOR 7.5 (GLOVE) ×2
GOWN STRL REUS W/ TWL LRG LVL3 (GOWN DISPOSABLE) ×2 IMPLANT
GOWN STRL REUS W/TWL LRG LVL3 (GOWN DISPOSABLE) ×2
NEEDLE HYPO 25X1 1.5 SAFETY (NEEDLE) ×2 IMPLANT
NS IRRIG 1000ML POUR BTL (IV SOLUTION) ×2 IMPLANT
PACK BASIN DAY SURGERY FS (CUSTOM PROCEDURE TRAY) ×2 IMPLANT
PENCIL BUTTON HOLSTER BLD 10FT (ELECTRODE) ×2 IMPLANT
SLEEVE SCD COMPRESS KNEE MED (MISCELLANEOUS) ×2 IMPLANT
SPONGE GAUZE 2X2 8PLY STRL LF (GAUZE/BANDAGES/DRESSINGS) IMPLANT
STRIP CLOSURE SKIN 1/2X4 (GAUZE/BANDAGES/DRESSINGS) ×2 IMPLANT
SUT MON AB 4-0 PC3 18 (SUTURE) ×2 IMPLANT
SUT PROLENE 6 0 P 1 18 (SUTURE) IMPLANT
SUT SILK 2 0 PERMA HAND 18 BK (SUTURE) IMPLANT
SUT VIC AB 3-0 SH 27 (SUTURE) ×1
SUT VIC AB 3-0 SH 27X BRD (SUTURE) ×1 IMPLANT
SYR BULB 3OZ (MISCELLANEOUS) ×2 IMPLANT
SYR CONTROL 10ML LL (SYRINGE) ×2 IMPLANT
TOWEL OR 17X24 6PK STRL BLUE (TOWEL DISPOSABLE) ×2 IMPLANT
TOWEL OR NON WOVEN STRL DISP B (DISPOSABLE) IMPLANT
TUBE CONNECTING 20X1/4 (TUBING) IMPLANT
YANKAUER SUCT BULB TIP NO VENT (SUCTIONS) IMPLANT

## 2017-04-14 NOTE — Discharge Instructions (Signed)
[facility] °Office Phone Number [phone] ° °Lipoma Excision: POST OP INSTRUCTIONS ° °Always review your discharge instruction sheet given to you by the facility where your surgery was performed. ° °IF YOU HAVE DISABILITY OR FAMILY LEAVE FORMS, YOU MUST BRING THEM TO THE OFFICE FOR PROCESSING.  DO NOT GIVE THEM TO YOUR DOCTOR. ° °1. A prescription for pain medication may be given to you upon discharge.  Take your pain medication as prescribed, if needed.  If narcotic pain medicine is not needed, then you may take acetaminophen (Tylenol) or ibuprofen (Advil) as needed. °2. Take your usually prescribed medications unless otherwise directed °3. If you need a refill on your pain medication, please contact your pharmacy.  They will contact our office to request authorization.  Prescriptions will not be filled after 5pm or on week-ends. °4. You should eat very light the first 24 hours after surgery, such as soup, crackers, pudding, etc.  Resume your normal diet the day after surgery. °5. Most patients will experience some swelling and bruising around the surgical site.  Ice packs will help.  Swelling and bruising can take several days to resolve.  °6. It is common to experience some constipation if taking pain medication after surgery.  Increasing fluid intake and taking a stool softener will usually help or prevent this problem from occurring.  A mild laxative (Milk of Magnesia or Miralax) should be taken according to package directions if there are no bowel movements after 48 hours. °7. You may remove your bandages 48 hours after surgery, and you may shower at that time.  You will have steri-strips (small skin tapes) in place directly over the incision.  These strips should be left on the skin for 7-10 days.   °8. ACTIVITIES:  You may resume regular daily activities (gradually increasing) beginning the next day.   You may have sexual intercourse when it is comfortable. °a. You may drive when you no  longer are taking prescription pain medication, you can comfortably wear a seatbelt, and you can safely maneuver your car and apply brakes. °b. RETURN TO WORK:  1-2 weeks °9. You should see your doctor in the office for a follow-up appointment approximately two to three weeks after your surgery.   ° °WHEN TO CALL YOUR DOCTOR: °1. Fever over 101.0 °2. Nausea and/or vomiting. °3. Extreme swelling or bruising. °4. Continued bleeding from incision. °5. Increased pain, redness, or drainage from the incision. ° °The clinic staff is available to answer your questions during regular business hours.  Please don’t hesitate to call and ask to speak to one of the nurses for clinical concerns.  If you have a medical emergency, go to the nearest emergency room or call 911.  A surgeon from [facility] is always on call at the hospital. ° °For further questions, please visit [email or web address]  ° ° ° ° ° °Post Anesthesia Home Care Instructions ° °Activity: °Get plenty of rest for the remainder of the day. A responsible individual must stay with you for 24 hours following the procedure.  °For the next 24 hours, DO NOT: °-Drive a car °-Operate machinery °-Drink alcoholic beverages °-Take any medication unless instructed by your physician °-Make any legal decisions or sign important papers. ° °Meals: °Start with liquid foods such as gelatin or soup. Progress to regular foods as tolerated. Avoid greasy, spicy, heavy foods. If nausea and/or vomiting occur, drink only clear liquids until the nausea and/or vomiting subsides. Call your physician if vomiting continues. ° °  Special Instructions/Symptoms: °Your throat may feel dry or sore from the anesthesia or the breathing tube placed in your throat during surgery. If this causes discomfort, gargle with warm salt water. The discomfort should disappear within 24 hours. ° °If you had a scopolamine patch placed behind your ear for the management of post- operative nausea  and/or vomiting: ° °1. The medication in the patch is effective for 72 hours, after which it should be removed.  Wrap patch in a tissue and discard in the trash. Wash hands thoroughly with soap and water. °2. You may remove the patch earlier than 72 hours if you experience unpleasant side effects which may include dry mouth, dizziness or visual disturbances. °3. Avoid touching the patch. Wash your hands with soap and water after contact with the patch. °  ° °

## 2017-04-14 NOTE — Anesthesia Postprocedure Evaluation (Signed)
Anesthesia Post Note  Patient: Dana Washington  Procedure(s) Performed: EXCISION OF SUBCUTANEOUS LIPOMA OF LEFT UPPER ABDOMINAL WALL (N/A Abdomen)     Patient location during evaluation: PACU Anesthesia Type: General Level of consciousness: sedated Pain management: pain level controlled Vital Signs Assessment: post-procedure vital signs reviewed and stable Respiratory status: spontaneous breathing and respiratory function stable Cardiovascular status: stable Postop Assessment: no apparent nausea or vomiting Anesthetic complications: no    Last Vitals:  Vitals:   04/14/17 1045 04/14/17 1100  BP: 124/80 121/76  Pulse: 71 73  Resp: 12 (!) 0  Temp:    SpO2: 95% 96%    Last Pain:  Vitals:   04/14/17 1045  TempSrc:   PainSc: Asleep                 Deniel Mcquiston DANIEL

## 2017-04-14 NOTE — Anesthesia Procedure Notes (Signed)
Procedure Name: LMA Insertion Date/Time: 04/14/2017 9:06 AM Performed by: Marrianne Mood, CRNA Pre-anesthesia Checklist: Patient identified, Emergency Drugs available, Suction available, Patient being monitored and Timeout performed Patient Re-evaluated:Patient Re-evaluated prior to induction Oxygen Delivery Method: Circle system utilized Preoxygenation: Pre-oxygenation with 100% oxygen Induction Type: IV induction Ventilation: Mask ventilation without difficulty LMA: LMA inserted LMA Size: 4.0 Number of attempts: 1 Airway Equipment and Method: Bite block Placement Confirmation: positive ETCO2 Tube secured with: Tape Dental Injury: Teeth and Oropharynx as per pre-operative assessment

## 2017-04-14 NOTE — Interval H&P Note (Signed)
History and Physical Interval Note:  04/14/2017 8:35 AM  Dana Washington  has presented today for surgery, with the diagnosis of Subcutaneous lipoma - lft upper abdominal wall  The various methods of treatment have been discussed with the patient and family. After consideration of risks, benefits and other options for treatment, the patient has consented to  Procedure(s): EXCISION OF SUBCUTANEOUS LIPOMA OF LEFT UPPER ABDOMINAL WALL (N/A) as a surgical intervention .  The patient's history has been reviewed, patient examined, no change in status, stable for surgery.  I have reviewed the patient's chart and labs.  Questions were answered to the patient's satisfaction.     [name]

## 2017-04-14 NOTE — Anesthesia Preprocedure Evaluation (Signed)
Anesthesia Evaluation  Patient identified by MRN, date of birth, ID band Patient awake    Reviewed: Allergy & Precautions, H&P , NPO status , Patient's Chart, lab work & pertinent test results  History of Anesthesia Complications (+) PONV and history of anesthetic complications  Airway Mallampati: II  TM Distance: >3 FB Neck ROM: full    Dental no notable dental hx. (+) Teeth Intact, Dental Advisory Given   Pulmonary neg pulmonary ROS,    Pulmonary exam normal breath sounds clear to auscultation       Cardiovascular Exercise Tolerance: Good hypertension, Pt. on medications Normal cardiovascular exam Rhythm:regular Rate:Normal     Neuro/Psych PSYCHIATRIC DISORDERS Anxiety Depression negative neurological ROS     GI/Hepatic negative GI ROS, Neg liver ROS,   Endo/Other  diabetesHypothyroidism Morbid obesityImpaired glucose tolerance.  History Hashimoto's thyroiditis  Renal/GU negative Renal ROS  negative genitourinary   Musculoskeletal   Abdominal (+) + obese,   Peds  Hematology negative hematology ROS (+)   Anesthesia Other Findings   Reproductive/Obstetrics negative OB ROS                             Anesthesia Physical  Anesthesia Plan  ASA: III  Anesthesia Plan: General   Post-op Pain Management:    Induction: Intravenous  PONV Risk Score and Plan: 4 or greater and Ondansetron, Dexamethasone, Scopolamine patch - Pre-op and Diphenhydramine  Airway Management Planned: LMA  Additional Equipment:   Intra-op Plan:   Post-operative Plan: Extubation in OR  Informed Consent: I have reviewed the patients History and Physical, chart, labs and discussed the procedure including the risks, benefits and alternatives for the proposed anesthesia with the patient or authorized representative who has indicated his/her understanding and acceptance.   Dental advisory given  Plan Discussed  with: CRNA, Surgeon and Anesthesiologist  Anesthesia Plan Comments:         Anesthesia Quick Evaluation

## 2017-04-14 NOTE — Transfer of Care (Signed)
Immediate Anesthesia Transfer of Care Note  Patient: Dana Washington  Procedure(s) Performed: EXCISION OF SUBCUTANEOUS LIPOMA OF LEFT UPPER ABDOMINAL WALL (N/A Abdomen)  Patient Location: PACU  Anesthesia Type:General  Level of Consciousness: awake and patient cooperative  Airway & Oxygen Therapy: Patient Spontanous Breathing and Patient connected to face mask oxygen  Post-op Assessment: Report given to RN and Post -op Vital signs reviewed and stable  Post vital signs: Reviewed and stable  Last Vitals:  Vitals Value Taken Time  BP 139/86 04/14/2017  9:53 AM  Temp    Pulse 97 04/14/2017  9:55 AM  Resp 19 04/14/2017  9:55 AM  SpO2 100 % 04/14/2017  9:55 AM  Vitals shown include unvalidated device data.  Last Pain:  Vitals:   04/14/17 0802  TempSrc: Oral  PainSc: 2       Patients Stated Pain Goal: 1 (35/36/14 4315)  Complications: No apparent anesthesia complications

## 2017-04-14 NOTE — Op Note (Signed)
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 The pain is constant with occasional intermittent exacerbation. She was examined and was noted to have a small palpable nodule (nickel-sized) below the left costal margin. No imaging. In 1999, she had enlarged lymph nodes biopsied from her left axilla. This took several attempts but she finally told that these were benign. The patient has a family history with multiple family members with breast cancer, but most of these family members were elderly.    The LUQ pain is sometimes sharp and does not always seem to be exacerbated by movement. The patient has lost almost 60 lbs in the last year with [employer] and is much more active. She exercises regularly.    CT scan was unremarkable.  She presents now for excision of the subcutaneous masses over her left subcostal margin  Description of procedure: The patient is brought to the operating room and placed in the supine position on the operating room table.  After an adequate level of general anesthesia was obtained her upper abdomen was prepped with ChloraPrep and draped in sterile fashion.  A timeout was taken to ensure the proper patient and proper procedure.  I marked the area over several of a firm palpable masses in the subcutaneous tissue.  This was just below the left costal margin.  I made a 3 cm incision over the largest mass.  We dissected down into the subcutaneous tissues with cautery.  I excised several firm lipomas in the subcutaneous fat down to the underlying fascia.  We then retracted our incision medially and I excised several other  masses medially.  These were all relatively small (2-3 cm each).  There is no residual palpable mass along the costal margin.  I inspected for hemostasis.  The wound was irrigated.  We closed the wound with 3-0 Vicryl and 4-0 Monocryl.  Benzoin Steri-Strips were applied.  The patient was then extubated and brought to the recovery room in stable condition.  All sponge, instrument, and needle counts are correct.  Imogene Burn. Georgette Dover, MD, [facility]  General/ Trauma Surgery  04/14/2017 9:49 AM

## 2017-04-15 ENCOUNTER — Encounter (HOSPITAL_BASED_OUTPATIENT_CLINIC_OR_DEPARTMENT_OTHER): Payer: Self-pay | Admitting: Surgery

## 2017-04-27 ENCOUNTER — Other Ambulatory Visit: Payer: Self-pay | Admitting: Internal Medicine

## 2017-04-27 MED FILL — ATORVASTATIN 20 MG TABLET: 20 | 90 days supply | Qty: 90 | Fill #0

## 2017-04-27 MED FILL — FLUoxetine HCL 20 MG CAPS: 20 | 90 days supply | Qty: 90 | Fill #3

## 2017-05-06 MED FILL — LARIN FE 1.5-30 TABLET: 1.5-30 | 84 days supply | Qty: 84 | Fill #1

## 2017-05-18 ENCOUNTER — Other Ambulatory Visit: Payer: Self-pay | Admitting: Internal Medicine

## 2017-05-18 MED FILL — FLUCONAZOLE 150 MG TABS: 150 | 1 days supply | Qty: 1 | Fill #0

## 2017-05-18 MED FILL — metFORMIN HCL 500 MG TABS: 500 | 90 days supply | Qty: 180 | Fill #2

## 2017-05-18 MED FILL — ESOMEPRAZOLE MAG DR 40 MG C: 40 | 90 days supply | Qty: 90 | Fill #0

## 2017-06-13 ENCOUNTER — Other Ambulatory Visit: Payer: Self-pay | Admitting: Internal Medicine

## 2017-06-13 MED FILL — SYNTHROID 100 MCG TABLET: 100 | 90 days supply | Qty: 90 | Fill #0

## 2017-06-13 MED FILL — BENAZEPRIL-HCTZ 20-12.5 MG: 20-12.5 | 90 days supply | Qty: 90 | Fill #0

## 2017-06-16 MED FILL — FLUCONAZOLE 150 MG TABS: 150 | 1 days supply | Qty: 1 | Fill #1

## 2017-07-12 MED FILL — TOPIRAMATE 100 MG TABLET: 100 | 90 days supply | Qty: 90 | Fill #2

## 2017-07-15 IMAGING — MG 2D DIGITAL SCREENING BILATERAL MAMMOGRAM WITH CAD AND ADJUNCT TO
8 of 13 series · 8 of 29 positions shown · non-contrast
Comparison: Previous exam(s).

CLINICAL DATA: Screening. Bilateral reduction mammoplasties in
1909.

EXAM:
2D DIGITAL SCREENING BILATERAL MAMMOGRAM WITH CAD AND ADJUNCT TOMO

[R MLO (1 of 2)]
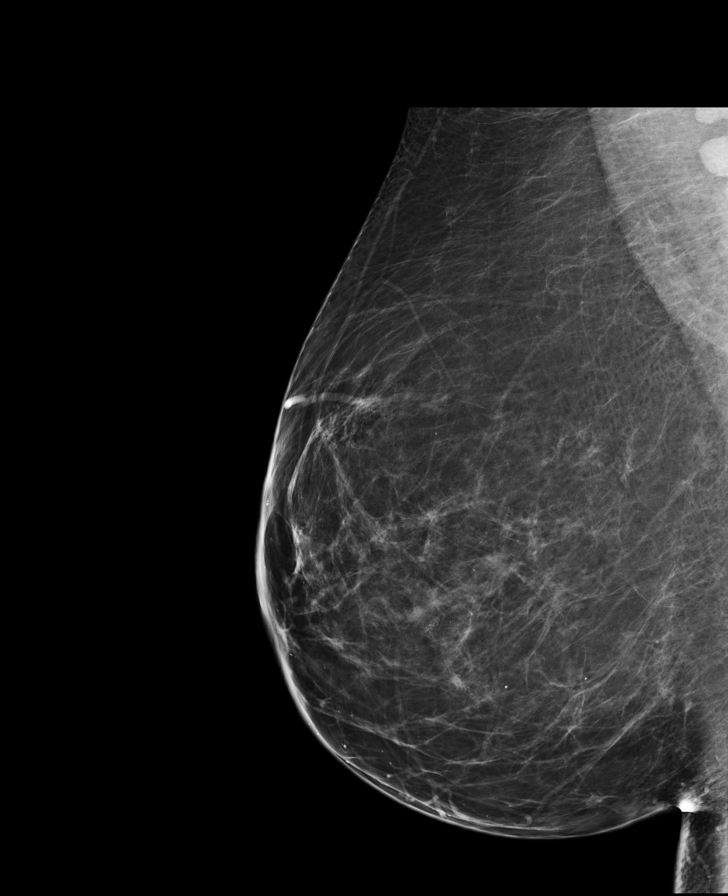

[R CC]
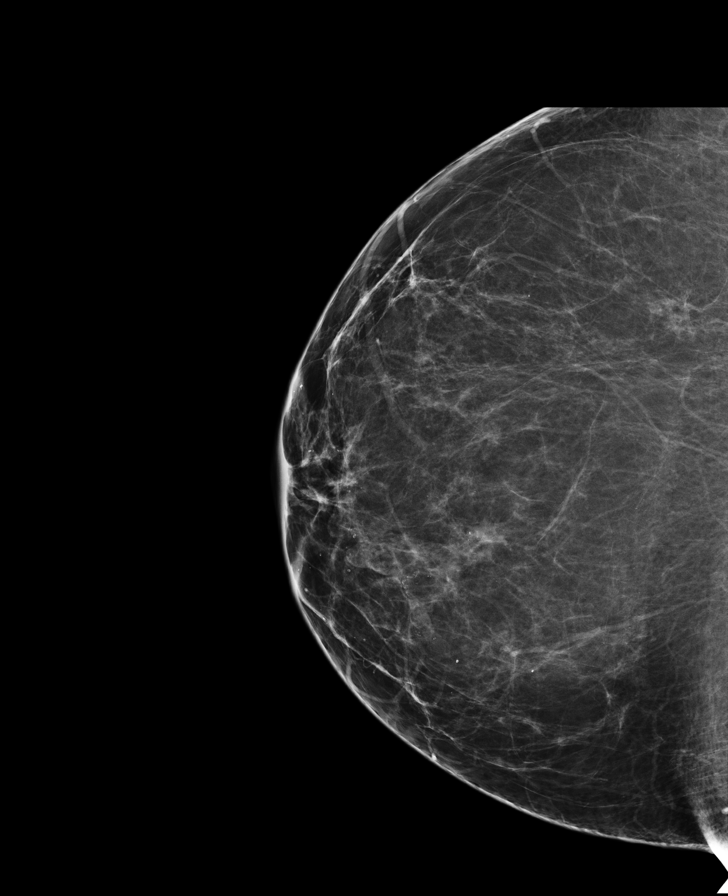

[L MLO]
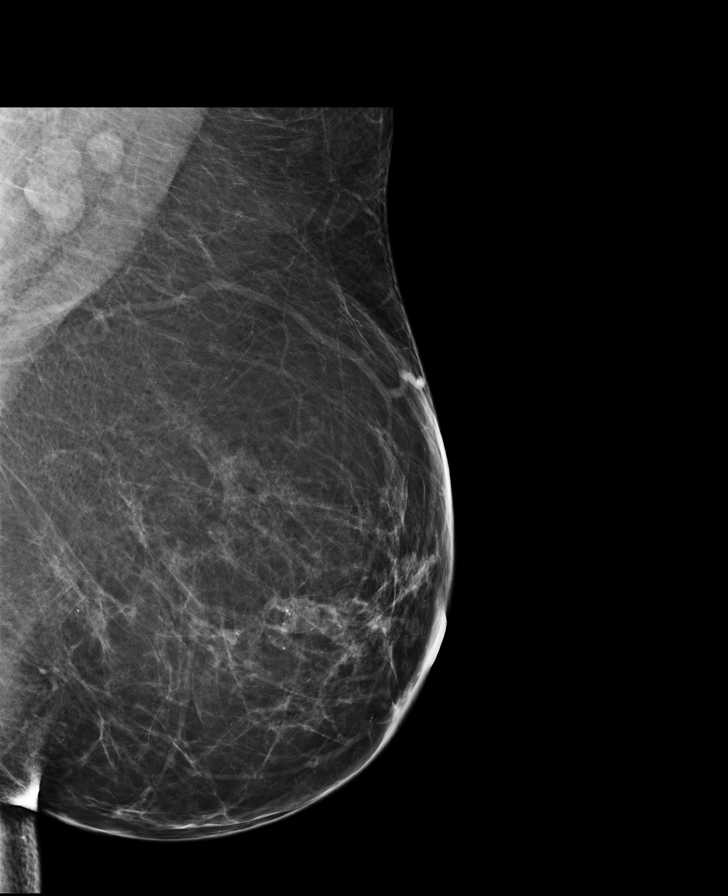

[L CC synth-2D]
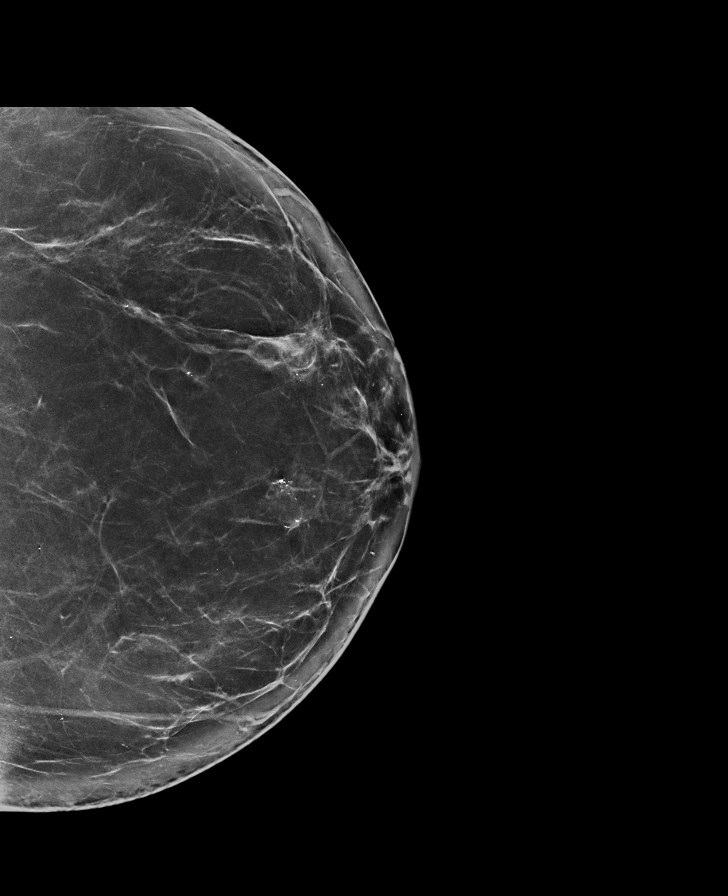

[R MLO synth-2D]
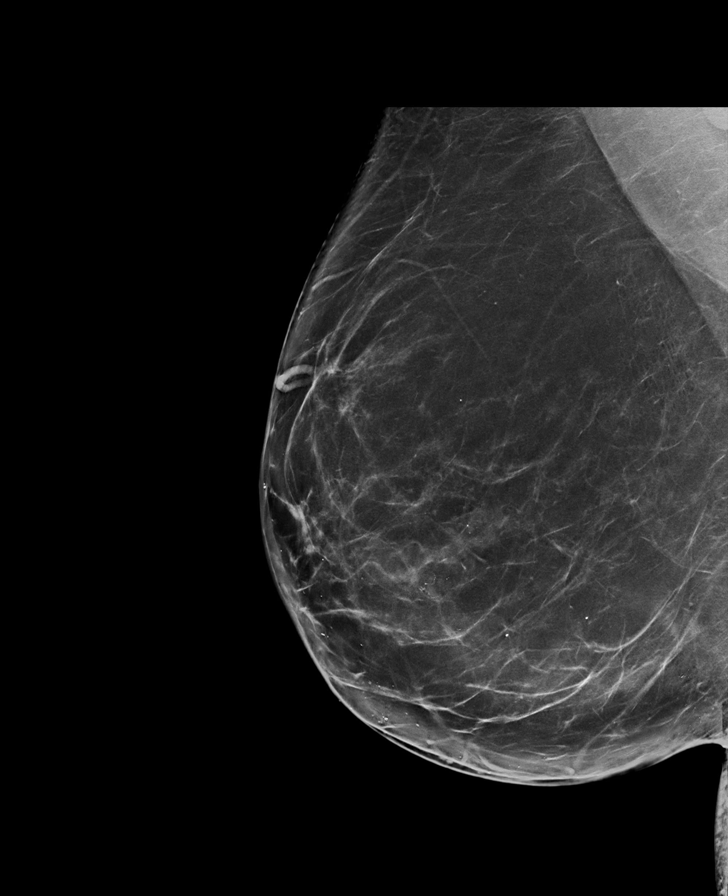

[L CC]
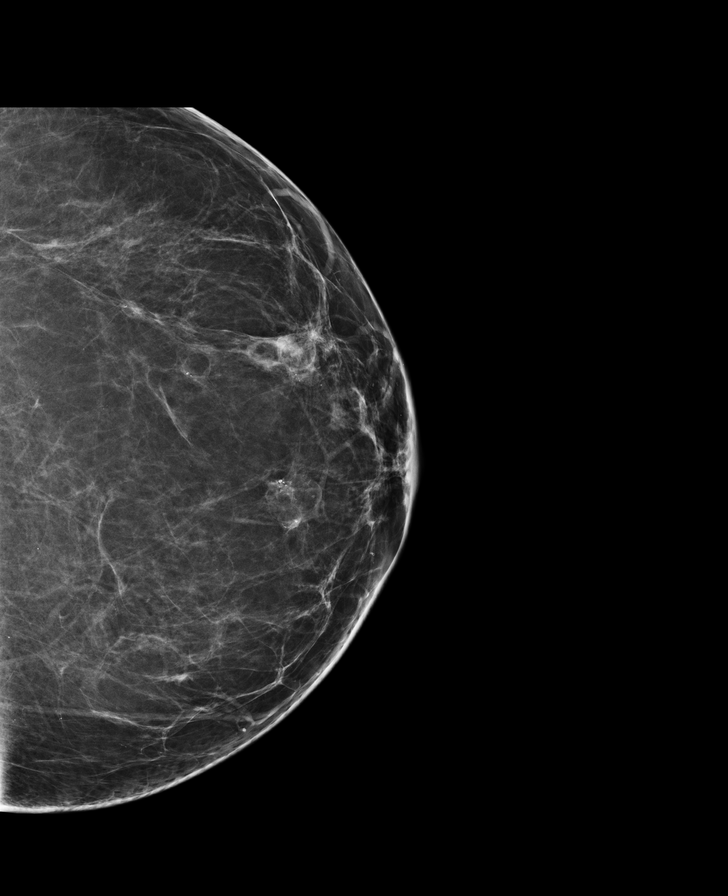

[L MLO synth-2D]
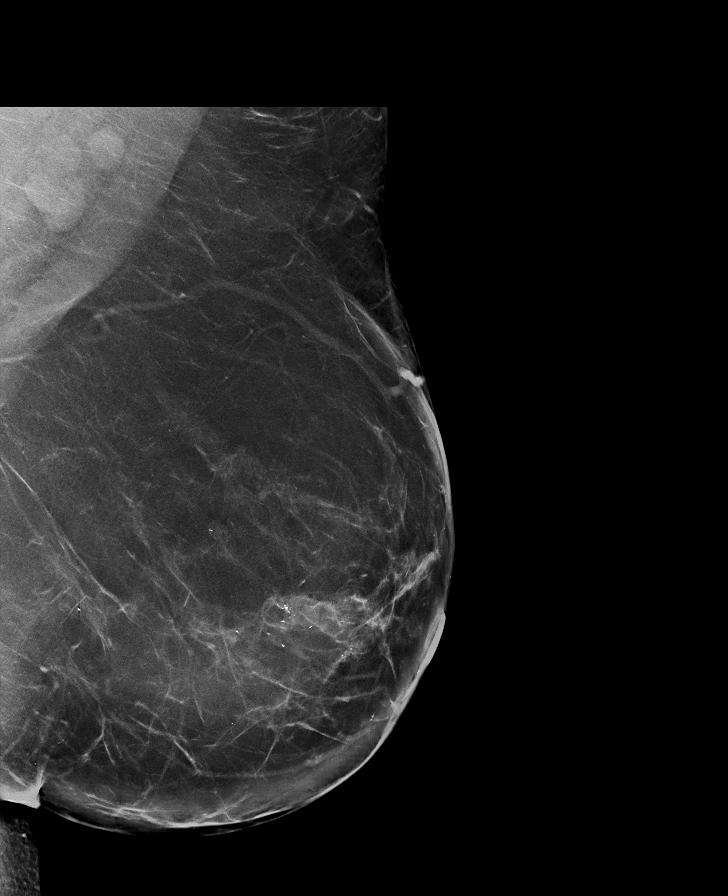

[R MLO (2 of 2)]
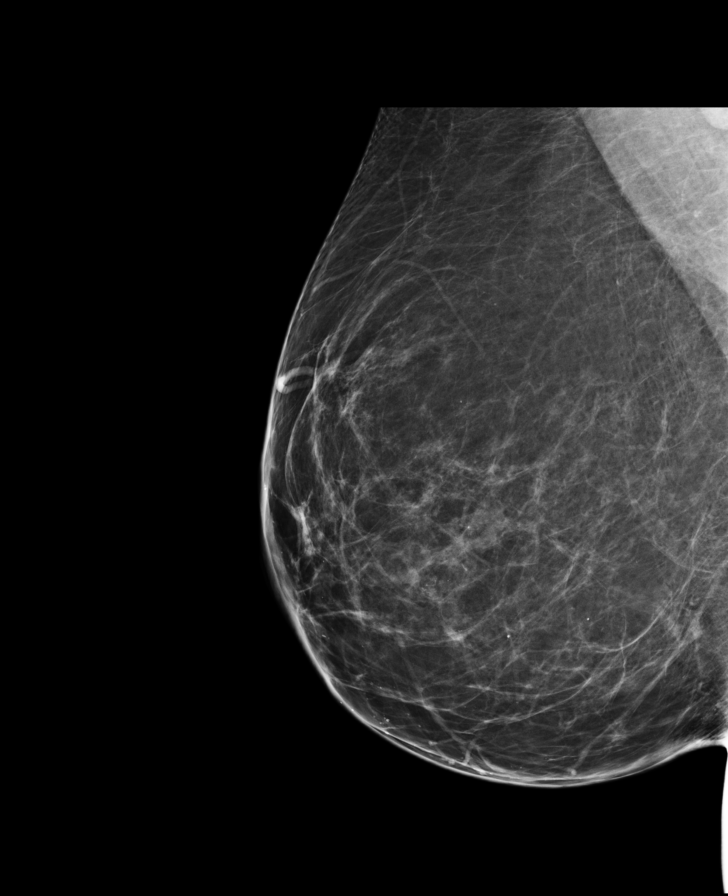

[8 of 29 positions shown; findings below may reference images not displayed]

ACR Breast Density Category b: There are scattered areas of
fibroglandular density.
FINDINGS: There are no findings suspicious for malignancy. Images were
processed with CAD.
IMPRESSION: No mammographic evidence of malignancy. A result letter of this
screening mammogram will be mailed directly to the patient.

RECOMMENDATION:
Screening mammogram in one year. (Code:VK-B-ID3)

BI-RADS CATEGORY  2: Benign.

## 2017-07-26 ENCOUNTER — Other Ambulatory Visit: Payer: Self-pay | Admitting: Internal Medicine

## 2017-07-26 MED FILL — LARIN FE 1.5-30 TABLET: 1.5-30 | 84 days supply | Qty: 84 | Fill #2

## 2017-07-26 MED FILL — ATORVASTATIN CALCIUM 20 MG: 20 | 90 days supply | Qty: 90 | Fill #1

## 2017-07-26 MED FILL — FLUoxetine HCL 20 MG CAPS: 20 | 90 days supply | Qty: 90 | Fill #0

## 2017-08-05 ENCOUNTER — Ambulatory Visit: Payer: No Typology Code available for payment source | Admitting: Podiatry

## 2017-08-05 ENCOUNTER — Ambulatory Visit (INDEPENDENT_AMBULATORY_CARE_PROVIDER_SITE_OTHER): Payer: No Typology Code available for payment source

## 2017-08-05 ENCOUNTER — Other Ambulatory Visit: Payer: Self-pay | Admitting: Podiatry

## 2017-08-05 ENCOUNTER — Encounter: Payer: Self-pay | Admitting: Podiatry

## 2017-08-05 VITALS — BP 115/73 | HR 68 | Resp 16 | Ht 65.0 in | Wt 225.0 lb

## 2017-08-05 DIAGNOSIS — M25572 Pain in left ankle and joints of left foot: Secondary | ICD-10-CM

## 2017-08-05 DIAGNOSIS — M779 Enthesopathy, unspecified: Secondary | ICD-10-CM | POA: Diagnosis not present

## 2017-08-05 DIAGNOSIS — E063 Autoimmune thyroiditis: Secondary | ICD-10-CM | POA: Insufficient documentation

## 2017-08-05 MED ORDER — TRIAMCINOLONE ACETONIDE 10 MG/ML IJ SUSP
10.0000 mg | Freq: Once | INTRAMUSCULAR | Status: AC
  Administered 2017-08-05: 10 mg

## 2017-08-05 NOTE — Progress Notes (Signed)
   Subjective:    Patient ID: Dana Washington, female    DOB: 1975-08-21, 42 y.o.   MRN: [id number]  HPI    Review of Systems  Musculoskeletal: Positive for arthralgias and gait problem.  All other systems reviewed and are negative.      Objective:   Physical Exam        Assessment & Plan:

## 2017-08-06 NOTE — Progress Notes (Signed)
Subjective:   Patient ID: Dana Washington, female   DOB: 42 y.o.   MRN: [id number]   HPI Patient presents stating she is developed a lot of pain on the inside of her left ankle and also has developed pain in the ankle itself on the outside.  States that this is been going on for several months and is gradually becoming more aggravating for her and she is tried supportive shoes and ice therapy.  Patient does not smoke and likes to be active   Review of Systems  All other systems reviewed and are negative.       Objective:  Physical Exam  Constitutional: She appears well-developed and well-nourished.  Cardiovascular: Intact distal pulses.  Pulmonary/Chest: Effort normal.  Musculoskeletal: Normal range of motion.  Neurological: She is alert.  Skin: Skin is warm.  Nursing note and vitals reviewed.   Neurovascular status found to be intact muscle strength adequate range of motion within normal limits with patient found to have discomfort on the medial side of the left ankle around the posterior tibial tendon into the insertion and also has quite a bit of discomfort in the sinus tarsi left with inflammation fluid buildup.  Patient has moderate depression of the arch and is found to have good digital perfusion and is well oriented x3     Assessment:  Inflammatory changes consistent with probable posterior tibial tendinitis and sinus tarsitis left     Plan:  H&P conditions reviewed and today careful sinus tarsi injection administered 3 mg Kenalog 5 mg Xylocaine along with sinus tarsi injection 3 mg Kenalog 5 mg Xylocaine and applied fascial brace to lift up the arch.  Gave instructions on physical therapy supportive shoes and not going barefoot and reappoint to recheck in the next several weeks  X-rays indicate that there is moderate depression of the arch with no indications of other pathology

## 2017-08-16 MED FILL — DICLOFENAC SODIUM 1% GEL: 1 | 32 days supply | Qty: 500 | Fill #0

## 2017-08-19 ENCOUNTER — Encounter: Payer: Self-pay | Admitting: Podiatry

## 2017-08-19 ENCOUNTER — Ambulatory Visit: Payer: No Typology Code available for payment source | Admitting: Podiatry

## 2017-08-19 DIAGNOSIS — M779 Enthesopathy, unspecified: Secondary | ICD-10-CM

## 2017-08-19 DIAGNOSIS — M7752 Other enthesopathy of left foot: Secondary | ICD-10-CM

## 2017-08-19 DIAGNOSIS — M25572 Pain in left ankle and joints of left foot: Secondary | ICD-10-CM

## 2017-08-19 DIAGNOSIS — M7751 Other enthesopathy of right foot: Secondary | ICD-10-CM | POA: Diagnosis not present

## 2017-08-19 NOTE — Progress Notes (Signed)
Subjective:   Patient ID: Dana Washington, female   DOB: 42 y.o.   MRN: [id number]   HPI Patient presents stating she is doing quite a bit better with pain on top of the foot and also had increased pain in her knee and has had a cortisone injection and is wearing a brace   ROS      Objective:  Physical Exam  Neurovascular status intact with patient found to have inflammation of a mild nature posterior tibial insertion sinus tarsi with discomfort more in the dorsal lateral aspect of the foot around the peritoneal tertius group and the ligament group in this area.  Patient does have moderate equinus condition also noted     Assessment:  Moderate improvement posterior tibial tendinitis sinus tarsi with medial ankle pain and dorsal lateral foot pain     Plan:  Reviewed heat ice therapy and recommended compounding cream in order to the patient at this time.  At this time I scanned for custom orthotics to hold the arch up to take stress off both the posterior tibial tendon sinus tarsi and also try and help medial knee pain.  Patient is scanned today will be seen back by ped orthotist when ready

## 2017-08-29 ENCOUNTER — Other Ambulatory Visit: Payer: Self-pay | Admitting: Internal Medicine

## 2017-08-29 MED FILL — BENAZEPRIL-HCTZ 20-12.5 MG: 20-12.5 | 90 days supply | Qty: 90 | Fill #0

## 2017-08-29 MED FILL — ESOMEPRAZOLE MAG DR 40 MG C: 40 | 90 days supply | Qty: 90 | Fill #1

## 2017-09-13 ENCOUNTER — Ambulatory Visit (INDEPENDENT_AMBULATORY_CARE_PROVIDER_SITE_OTHER): Payer: No Typology Code available for payment source | Admitting: Orthotics

## 2017-09-13 ENCOUNTER — Encounter: Payer: No Typology Code available for payment source | Admitting: Orthotics

## 2017-09-13 DIAGNOSIS — M25572 Pain in left ankle and joints of left foot: Secondary | ICD-10-CM

## 2017-09-13 DIAGNOSIS — M779 Enthesopathy, unspecified: Secondary | ICD-10-CM

## 2017-09-13 NOTE — Progress Notes (Signed)
Patient came in today to pick up custom made foot orthotics.  The goals were accomplished and the patient reported no dissatisfaction with said orthotics.  Patient was advised of breakin period and how to report any issues. 

## 2017-09-19 ENCOUNTER — Other Ambulatory Visit: Payer: Self-pay | Admitting: Internal Medicine

## 2017-09-19 DIAGNOSIS — Z1231 Encounter for screening mammogram for malignant neoplasm of breast: Secondary | ICD-10-CM

## 2017-09-19 MED FILL — SYNTHROID 100 MCG TABLET: 100 | 90 days supply | Qty: 90 | Fill #0

## 2017-10-03 DIAGNOSIS — M25569 Pain in unspecified knee: Secondary | ICD-10-CM | POA: Insufficient documentation

## 2017-10-10 MED FILL — TOPIRAMATE 100 MG TABLET: 100 | 90 days supply | Qty: 90 | Fill #3

## 2017-10-10 MED FILL — LARIN FE 1.5-30 TABLET: 1.5-30 | 84 days supply | Qty: 84 | Fill #3

## 2017-10-18 ENCOUNTER — Ambulatory Visit: Payer: Self-pay | Admitting: Orthopedic Surgery

## 2017-10-18 ENCOUNTER — Other Ambulatory Visit: Payer: Self-pay | Admitting: Orthopedic Surgery

## 2017-10-20 ENCOUNTER — Other Ambulatory Visit: Payer: Self-pay

## 2017-10-20 ENCOUNTER — Encounter (HOSPITAL_BASED_OUTPATIENT_CLINIC_OR_DEPARTMENT_OTHER): Payer: Self-pay

## 2017-10-20 NOTE — H&P (View-Only) (Signed)
Spoke with:  Jannet NPO:  No food after midnight/Clear liquids until 8:00AM DOS Arrival time:  12Noon Labs: Istat 8 (EKG 04/11/2017 chart/epic) AM medications: Alprazolam, Atorvastatin, Nexium, Fluoxetine, Synthroid Pre op orders: Yes Ride home:  Pam (Mom) [phone]

## 2017-10-20 NOTE — Progress Notes (Signed)
Spoke with:  Tyeesha NPO:  No food after midnight/Clear liquids until 8:00AM DOS Arrival time:  12Noon Labs: Istat 8 (EKG 04/11/2017 chart/epic) AM medications: Alprazolam, Atorvastatin, Nexium, Fluoxetine, Synthroid Pre op orders: Yes Ride home:  Pam (Mom) [phone]

## 2017-10-23 MED FILL — ATORVASTATIN CALCIUM 20 MG: 20 | 90 days supply | Qty: 90 | Fill #2

## 2017-10-23 MED FILL — FLUoxetine HCL 20 MG CAPS: 20 | 90 days supply | Qty: 90 | Fill #1

## 2017-10-25 ENCOUNTER — Ambulatory Visit (HOSPITAL_BASED_OUTPATIENT_CLINIC_OR_DEPARTMENT_OTHER): Payer: No Typology Code available for payment source | Admitting: Anesthesiology

## 2017-10-25 ENCOUNTER — Ambulatory Visit (HOSPITAL_BASED_OUTPATIENT_CLINIC_OR_DEPARTMENT_OTHER)
Admission: RE | Admit: 2017-10-25 | Discharge: 2017-10-25 | Disposition: A | Discharge status: Home / Self Care | Payer: No Typology Code available for payment source | Source: Ambulatory Visit | Attending: Specialist | Admitting: Specialist

## 2017-10-25 ENCOUNTER — Encounter (HOSPITAL_BASED_OUTPATIENT_CLINIC_OR_DEPARTMENT_OTHER)
Admission: RE | Disposition: A | Discharge status: Home / Self Care | Payer: Self-pay | Source: Ambulatory Visit | Attending: Specialist

## 2017-10-25 ENCOUNTER — Other Ambulatory Visit: Payer: Self-pay

## 2017-10-25 ENCOUNTER — Encounter (HOSPITAL_BASED_OUTPATIENT_CLINIC_OR_DEPARTMENT_OTHER): Payer: Self-pay | Admitting: Anesthesiology

## 2017-10-25 DIAGNOSIS — X58XXXA Exposure to other specified factors, initial encounter: Secondary | ICD-10-CM | POA: Diagnosis not present

## 2017-10-25 DIAGNOSIS — Z791 Long term (current) use of non-steroidal anti-inflammatories (NSAID): Secondary | ICD-10-CM | POA: Diagnosis not present

## 2017-10-25 DIAGNOSIS — K219 Gastro-esophageal reflux disease without esophagitis: Secondary | ICD-10-CM | POA: Insufficient documentation

## 2017-10-25 DIAGNOSIS — Z79890 Hormone replacement therapy (postmenopausal): Secondary | ICD-10-CM | POA: Diagnosis not present

## 2017-10-25 DIAGNOSIS — E785 Hyperlipidemia, unspecified: Secondary | ICD-10-CM | POA: Diagnosis not present

## 2017-10-25 DIAGNOSIS — Z793 Long term (current) use of hormonal contraceptives: Secondary | ICD-10-CM | POA: Insufficient documentation

## 2017-10-25 DIAGNOSIS — Z9889 Other specified postprocedural states: Secondary | ICD-10-CM

## 2017-10-25 DIAGNOSIS — S83242A Other tear of medial meniscus, current injury, left knee, initial encounter: Secondary | ICD-10-CM | POA: Insufficient documentation

## 2017-10-25 DIAGNOSIS — F429 Obsessive-compulsive disorder, unspecified: Secondary | ICD-10-CM | POA: Insufficient documentation

## 2017-10-25 DIAGNOSIS — M94262 Chondromalacia, left knee: Secondary | ICD-10-CM | POA: Insufficient documentation

## 2017-10-25 DIAGNOSIS — F329 Major depressive disorder, single episode, unspecified: Secondary | ICD-10-CM | POA: Insufficient documentation

## 2017-10-25 DIAGNOSIS — I10 Essential (primary) hypertension: Secondary | ICD-10-CM | POA: Insufficient documentation

## 2017-10-25 DIAGNOSIS — E559 Vitamin D deficiency, unspecified: Secondary | ICD-10-CM | POA: Insufficient documentation

## 2017-10-25 DIAGNOSIS — S83282A Other tear of lateral meniscus, current injury, left knee, initial encounter: Secondary | ICD-10-CM | POA: Insufficient documentation

## 2017-10-25 DIAGNOSIS — G43909 Migraine, unspecified, not intractable, without status migrainosus: Secondary | ICD-10-CM | POA: Insufficient documentation

## 2017-10-25 DIAGNOSIS — F419 Anxiety disorder, unspecified: Secondary | ICD-10-CM | POA: Diagnosis not present

## 2017-10-25 DIAGNOSIS — M25562 Pain in left knee: Secondary | ICD-10-CM | POA: Diagnosis present

## 2017-10-25 DIAGNOSIS — Z7984 Long term (current) use of oral hypoglycemic drugs: Secondary | ICD-10-CM | POA: Diagnosis not present

## 2017-10-25 DIAGNOSIS — E063 Autoimmune thyroiditis: Secondary | ICD-10-CM | POA: Diagnosis not present

## 2017-10-25 DIAGNOSIS — M1712 Unilateral primary osteoarthritis, left knee: Secondary | ICD-10-CM | POA: Diagnosis not present

## 2017-10-25 DIAGNOSIS — Z79899 Other long term (current) drug therapy: Secondary | ICD-10-CM | POA: Diagnosis not present

## 2017-10-25 DIAGNOSIS — K5900 Constipation, unspecified: Secondary | ICD-10-CM | POA: Insufficient documentation

## 2017-10-25 HISTORY — DX: Dislocation of jaw, unspecified side, initial encounter: S03.00XA

## 2017-10-25 HISTORY — DX: Obsessive-compulsive disorder, unspecified: F42.9

## 2017-10-25 HISTORY — DX: Other herpesviral infection: B00.89

## 2017-10-25 HISTORY — DX: Personal history of other specified conditions: Z87.898

## 2017-10-25 HISTORY — DX: Metabolic syndrome: E88.810

## 2017-10-25 HISTORY — DX: Metabolic syndrome: E88.81

## 2017-10-25 HISTORY — DX: Constipation, unspecified: K59.00

## 2017-10-25 HISTORY — DX: Insomnia, unspecified: G47.00

## 2017-10-25 HISTORY — PX: KNEE ARTHROSCOPY WITH MEDIAL MENISECTOMY: SHX5651

## 2017-10-25 HISTORY — DX: Obesity, unspecified: E66.9

## 2017-10-25 HISTORY — PX: CHONDROPLASTY: SHX5177

## 2017-10-25 LAB — POCT I-STAT, CHEM 8
BUN: 18 mg/dL (ref 6–20)
CHLORIDE: 108 mmol/L (ref 98–111)
Calcium, Ion: 1.25 mmol/L (ref 1.15–1.40)
Creatinine, Ser: 0.8 mg/dL (ref 0.44–1.00)
Glucose, Bld: 92 mg/dL (ref 70–99)
HEMATOCRIT: 42 % (ref 36.0–46.0)
Hemoglobin: 14.3 g/dL (ref 12.0–15.0)
Potassium: 3.8 mmol/L (ref 3.5–5.1)
SODIUM: 141 mmol/L (ref 135–145)
TCO2: 20 mmol/L — ABNORMAL LOW (ref 22–32)

## 2017-10-25 LAB — POCT PREGNANCY, URINE: PREG TEST UR: NEGATIVE

## 2017-10-25 SURGERY — ARTHROSCOPY, KNEE, WITH MEDIAL MENISCECTOMY
Anesthesia: General | Site: Knee | Laterality: Left

## 2017-10-25 MED ORDER — FENTANYL CITRATE (PF) 100 MCG/2ML IJ SOLN
INTRAMUSCULAR | Status: AC
  Filled 2017-10-25: qty 2

## 2017-10-25 MED ORDER — PROPOFOL 10 MG/ML IV BOLUS
INTRAVENOUS | Status: DC | PRN
  Administered 2017-10-25: 20 mg via INTRAVENOUS
  Administered 2017-10-25: 200 mg via INTRAVENOUS
  Administered 2017-10-25: 20 mg via INTRAVENOUS

## 2017-10-25 MED ORDER — CHLORHEXIDINE GLUCONATE 4 % EX LIQD
60.0000 mL | Freq: Once | CUTANEOUS | Status: DC
  Filled 2017-10-25: qty 118

## 2017-10-25 MED ORDER — ONDANSETRON HCL 4 MG/2ML IJ SOLN
INTRAMUSCULAR | Status: DC | PRN
  Administered 2017-10-25: 4 mg via INTRAVENOUS

## 2017-10-25 MED ORDER — GLYCOPYRROLATE PF 0.2 MG/ML IJ SOSY
PREFILLED_SYRINGE | INTRAMUSCULAR | Status: DC | PRN
  Administered 2017-10-25: .2 mg via INTRAVENOUS

## 2017-10-25 MED ORDER — TRIAMCINOLONE ACETONIDE 40 MG/ML IJ SUSP
INTRAMUSCULAR | Status: DC | PRN
  Administered 2017-10-25: 40 mg via INTRAMUSCULAR

## 2017-10-25 MED ORDER — MORPHINE SULFATE (PF) 4 MG/ML IV SOLN
INTRAVENOUS | Status: DC | PRN
  Administered 2017-10-25: 4 mg via INTRAVENOUS

## 2017-10-25 MED ORDER — OXYCODONE HCL 5 MG PO TABS
5.0000 mg | ORAL_TABLET | Freq: Once | ORAL | Status: AC | PRN
  Administered 2017-10-25: 5 mg via ORAL
  Filled 2017-10-25: qty 1

## 2017-10-25 MED ORDER — CEFAZOLIN SODIUM-DEXTROSE 2-4 GM/100ML-% IV SOLN
INTRAVENOUS | Status: AC
  Filled 2017-10-25: qty 100

## 2017-10-25 MED ORDER — BUPIVACAINE HCL 0.25 % IJ SOLN
INTRAMUSCULAR | Status: DC | PRN
  Administered 2017-10-25: 30 mL

## 2017-10-25 MED ORDER — MORPHINE SULFATE (PF) 4 MG/ML IV SOLN
INTRAVENOUS | Status: AC
  Filled 2017-10-25: qty 1

## 2017-10-25 MED ORDER — DEXAMETHASONE SODIUM PHOSPHATE 10 MG/ML IJ SOLN
INTRAMUSCULAR | Status: AC
  Filled 2017-10-25: qty 1

## 2017-10-25 MED ORDER — OXYCODONE HCL 5 MG/5ML PO SOLN
5.0000 mg | Freq: Once | ORAL | Status: AC | PRN
  Filled 2017-10-25: qty 5

## 2017-10-25 MED ORDER — LACTATED RINGERS IV SOLN
INTRAVENOUS | Status: DC
  Administered 2017-10-25 (×2): via INTRAVENOUS
  Filled 2017-10-25: qty 1000

## 2017-10-25 MED ORDER — SCOPOLAMINE 1 MG/3DAYS TD PT72
MEDICATED_PATCH | TRANSDERMAL | Status: DC | PRN
  Administered 2017-10-25: 1 via TRANSDERMAL

## 2017-10-25 MED ORDER — FENTANYL CITRATE (PF) 100 MCG/2ML IJ SOLN
25.0000 ug | INTRAMUSCULAR | Status: DC | PRN
  Administered 2017-10-25 (×2): 50 ug via INTRAVENOUS
  Filled 2017-10-25: qty 1

## 2017-10-25 MED ORDER — ASPIRIN EC 325 MG PO TBEC: 325.0000 mg | DELAYED_RELEASE_TABLET | Freq: Two times a day (BID) | ORAL | 3 refills | Status: AC

## 2017-10-25 MED ORDER — SODIUM CHLORIDE 0.9 % IR SOLN
Status: DC | PRN
  Administered 2017-10-25: 6000 mL

## 2017-10-25 MED ORDER — FENTANYL CITRATE (PF) 100 MCG/2ML IJ SOLN
INTRAMUSCULAR | Status: DC | PRN
  Administered 2017-10-25 (×2): 50 ug via INTRAVENOUS

## 2017-10-25 MED ORDER — ONDANSETRON HCL 4 MG/2ML IJ SOLN
INTRAMUSCULAR | Status: AC
  Filled 2017-10-25: qty 2

## 2017-10-25 MED ORDER — ACETAMINOPHEN 10 MG/ML IV SOLN
INTRAVENOUS | Status: AC
  Filled 2017-10-25: qty 100

## 2017-10-25 MED ORDER — MIDAZOLAM HCL 5 MG/5ML IJ SOLN
INTRAMUSCULAR | Status: DC | PRN
  Administered 2017-10-25: 2 mg via INTRAVENOUS

## 2017-10-25 MED ORDER — ACETAMINOPHEN 10 MG/ML IV SOLN
INTRAVENOUS | Status: DC | PRN
  Administered 2017-10-25: 1000 mg via INTRAVENOUS

## 2017-10-25 MED ORDER — CEFAZOLIN SODIUM-DEXTROSE 2-4 GM/100ML-% IV SOLN
2.0000 g | INTRAVENOUS | Status: AC
  Administered 2017-10-25: 2 g via INTRAVENOUS
  Filled 2017-10-25: qty 100

## 2017-10-25 MED ORDER — HYDROCODONE-ACETAMINOPHEN 5-325 MG PO TABS: 1.0000 | ORAL_TABLET | ORAL | 0 refills | Status: DC | PRN

## 2017-10-25 MED ORDER — OXYCODONE HCL 5 MG PO TABS
ORAL_TABLET | ORAL | Status: AC
  Filled 2017-10-25: qty 1

## 2017-10-25 MED ORDER — MIDAZOLAM HCL 2 MG/2ML IJ SOLN
INTRAMUSCULAR | Status: AC
  Filled 2017-10-25: qty 2

## 2017-10-25 MED ORDER — GLYCOPYRROLATE PF 0.2 MG/ML IJ SOSY
PREFILLED_SYRINGE | INTRAMUSCULAR | Status: AC
  Filled 2017-10-25: qty 1

## 2017-10-25 MED ORDER — SCOPOLAMINE 1 MG/3DAYS TD PT72
MEDICATED_PATCH | TRANSDERMAL | Status: AC
  Filled 2017-10-25: qty 1

## 2017-10-25 MED ORDER — PROPOFOL 10 MG/ML IV BOLUS
INTRAVENOUS | Status: AC
  Filled 2017-10-25: qty 20

## 2017-10-25 MED ORDER — ONDANSETRON HCL 4 MG/2ML IJ SOLN
4.0000 mg | Freq: Four times a day (QID) | INTRAMUSCULAR | Status: DC | PRN
  Filled 2017-10-25: qty 2

## 2017-10-25 MED ORDER — CEPHALEXIN 500 MG PO CAPS: 500.0000 mg | ORAL_CAPSULE | Freq: Three times a day (TID) | ORAL | 0 refills | Status: DC

## 2017-10-25 MED ORDER — LIDOCAINE 2% (20 MG/ML) 5 ML SYRINGE
INTRAMUSCULAR | Status: DC | PRN
  Administered 2017-10-25: 100 mg via INTRAVENOUS

## 2017-10-25 MED ORDER — LIDOCAINE 2% (20 MG/ML) 5 ML SYRINGE
INTRAMUSCULAR | Status: AC
  Filled 2017-10-25: qty 5

## 2017-10-25 MED ORDER — DEXAMETHASONE SODIUM PHOSPHATE 4 MG/ML IJ SOLN
INTRAMUSCULAR | Status: DC | PRN
  Administered 2017-10-25: 10 mg via INTRAVENOUS

## 2017-10-25 MED FILL — HYDROCODON-APAP 5-325: 5-325 | 6 days supply | Qty: 40 | Fill #0

## 2017-10-25 MED FILL — CEPHALEXIN 500 MG CAPSULE: 500 | 4 days supply | Qty: 12 | Fill #0

## 2017-10-25 SURGICAL SUPPLY — 47 items
BANDAGE ESMARK 6X9 LF (GAUZE/BANDAGES/DRESSINGS) ×1 IMPLANT
BLADE CUDA GRT WHITE 3.5 (BLADE) ×2 IMPLANT
BNDG ESMARK 6X9 LF (GAUZE/BANDAGES/DRESSINGS) ×2
BNDG GAUZE ELAST 4 BULKY (GAUZE/BANDAGES/DRESSINGS) ×2 IMPLANT
CANISTER SUCTION 1200CC (MISCELLANEOUS) ×2 IMPLANT
CONT SPECI 4OZ STER CLIK (MISCELLANEOUS) IMPLANT
COVER WAND RF STERILE (DRAPES) ×2 IMPLANT
CUFF TOURN SGL QUICK 34 (TOURNIQUET CUFF) ×1
CUFF TOURN SGL QUICK 42 (TOURNIQUET CUFF) IMPLANT
CUFF TOURN SGL QUICK 44 (TOURNIQUET CUFF) ×2 IMPLANT
CUFF TOURNIQUET SINGLE 34IN LL (TOURNIQUET CUFF) ×2 IMPLANT
CUFF TRNQT CYL 34X4X40X1 (TOURNIQUET CUFF) ×1 IMPLANT
DRAPE ARTHROSCOPY W/POUCH 114 (DRAPES) ×2 IMPLANT
DRAPE INCISE IOBAN 66X45 STRL (DRAPES) ×2 IMPLANT
DRAPE U-SHAPE 47X51 STRL (DRAPES) ×2 IMPLANT
DURAPREP 26ML APPLICATOR (WOUND CARE) ×2 IMPLANT
ELECT MENISCUS 165MM 90D (ELECTRODE) IMPLANT
GAUZE SPONGE 4X4 12PLY STRL (GAUZE/BANDAGES/DRESSINGS) ×2 IMPLANT
GAUZE XEROFORM 1X8 LF (GAUZE/BANDAGES/DRESSINGS) ×2 IMPLANT
GLOVE BIO SURGEON STRL SZ7.5 (GLOVE) ×4 IMPLANT
GLOVE BIO SURGEON STRL SZ8 (GLOVE) ×4 IMPLANT
GLOVE BIOGEL PI IND STRL 7.5 (GLOVE) ×2 IMPLANT
GLOVE BIOGEL PI INDICATOR 7.5 (GLOVE) ×2
GLOVE ECLIPSE 7.0 STRL STRAW (GLOVE) ×2 IMPLANT
GLOVE INDICATOR 8.0 STRL GRN (GLOVE) ×4 IMPLANT
GOWN STRL REUS W/TWL XL LVL3 (GOWN DISPOSABLE) ×6 IMPLANT
IV NS IRRIG 3000ML ARTHROMATIC (IV SOLUTION) ×4 IMPLANT
KIT TURNOVER CYSTO (KITS) ×2 IMPLANT
KNEE WRAP E Z 3 GEL PACK (MISCELLANEOUS) ×2 IMPLANT
MANIFOLD NEPTUNE II (INSTRUMENTS) ×2 IMPLANT
NEEDLE HYPO 22GX1.5 SAFETY (NEEDLE) ×2 IMPLANT
PACK ARTHROSCOPY DSU (CUSTOM PROCEDURE TRAY) ×2 IMPLANT
PACK BASIN DAY SURGERY FS (CUSTOM PROCEDURE TRAY) ×2 IMPLANT
PAD ABD 8X10 STRL (GAUZE/BANDAGES/DRESSINGS) ×2 IMPLANT
PAD ARMBOARD 7.5X6 YLW CONV (MISCELLANEOUS) IMPLANT
PAD CAST 4YDX4 CTTN HI CHSV (CAST SUPPLIES) ×1 IMPLANT
PAD FOR LEG HOLDER (MISCELLANEOUS) ×2 IMPLANT
PADDING CAST COTTON 4X4 STRL (CAST SUPPLIES) ×1
PROBE BIPOLAR 50 DEGREE SUCT (MISCELLANEOUS) ×2 IMPLANT
PROBE BIPOLAR ATHRO 135MM 90D (MISCELLANEOUS) IMPLANT
SET ARTHROSCOPY TUBING (MISCELLANEOUS) ×1
SET ARTHROSCOPY TUBING LN (MISCELLANEOUS) ×1 IMPLANT
SUT ETHILON 4 0 PS 2 18 (SUTURE) ×2 IMPLANT
SYR CONTROL 10ML LL (SYRINGE) ×2 IMPLANT
TOWEL OR 17X24 6PK STRL BLUE (TOWEL DISPOSABLE) ×4 IMPLANT
TUBE CONNECTING 12X1/4 (SUCTIONS) ×2 IMPLANT
WATER STERILE IRR 500ML POUR (IV SOLUTION) ×2 IMPLANT

## 2017-10-25 NOTE — Interval H&P Note (Signed)
History and Physical Interval Note:  10/25/2017 4:23 PM  Dana Washington  has presented today for surgery, with the diagnosis of Left knee medial meniscal tear, osteoarthritis  The various methods of treatment have been discussed with the patient and family. After consideration of risks, benefits and other options for treatment, the patient has consented to  Procedure(s): LEFT KNEE ARTHROSCOPY WITH MEDIAL MENISECTOMY (Left) as a surgical intervention .  The patient's history has been reviewed, patient examined, no change in status, stable for surgery.  I have reviewed the patient's chart and labs.  Questions were answered to the patient's satisfaction.     Cashton Hosley ANDREW

## 2017-10-25 NOTE — Transfer of Care (Signed)
  Last Vitals:  Vitals Value Taken Time  BP 131/84 10/25/2017  5:17 PM  Temp    Pulse 84 10/25/2017  5:19 PM  Resp 22 10/25/2017  5:19 PM  SpO2 100 % 10/25/2017  5:19 PM  Vitals shown include unvalidated device data.  Last Pain:  Vitals:   10/25/17 1216  TempSrc: Oral  PainSc: 3       Patients Stated Pain Goal: 8 (10/25/17 1216)  Immediate Anesthesia Transfer of Care Note  Patient: Dana Washington  Procedure(s) Performed: Procedure(s) (LRB): LEFT KNEE ARTHROSCOPY WITH MEDIAL AND LATERAL MENISECTOMY (Left) CHONDROPLASTY (Left)  Patient Location: PACU  Anesthesia Type: General  Level of Consciousness: awake, alert  and oriented  Airway & Oxygen Therapy: Patient Spontanous Breathing and Patient connected to nasal cannula oxygen  Post-op Assessment: Report given to PACU RN and Post -op Vital signs reviewed and stable  Post vital signs: Reviewed and stable  Complications: No apparent anesthesia complications

## 2017-10-25 NOTE — Anesthesia Postprocedure Evaluation (Signed)
Anesthesia Post Note  Patient: Dana Washington  Procedure(s) Performed: LEFT KNEE ARTHROSCOPY WITH MEDIAL AND LATERAL MENISECTOMY (Left Knee) CHONDROPLASTY (Left Knee)     Patient location during evaluation: PACU Anesthesia Type: General Level of consciousness: awake and alert Pain management: pain level controlled Vital Signs Assessment: post-procedure vital signs reviewed and stable Respiratory status: spontaneous breathing, nonlabored ventilation, respiratory function stable and patient connected to nasal cannula oxygen Cardiovascular status: blood pressure returned to baseline and stable Postop Assessment: no apparent nausea or vomiting Anesthetic complications: no    Last Vitals:  Vitals:   10/25/17 1810 10/25/17 1850  BP:  116/81  Pulse: 84 66  Resp: 15 14  Temp:  37.4 C  SpO2: 100% 98%    Last Pain:  Vitals:   10/25/17 1850  TempSrc:   PainSc: 2                  Tiajuana Amass

## 2017-10-25 NOTE — Op Note (Signed)
[id number]

## 2017-10-25 NOTE — Anesthesia Preprocedure Evaluation (Signed)
Anesthesia Evaluation  Patient identified by MRN, date of birth, ID band Patient awake    Reviewed: Allergy & Precautions, H&P , NPO status , Patient's Chart, lab work & pertinent test results  History of Anesthesia Complications (+) PONV and history of anesthetic complications  Airway Mallampati: II   Neck ROM: full    Dental   Pulmonary    breath sounds clear to auscultation       Cardiovascular hypertension,  Rhythm:regular Rate:Normal     Neuro/Psych  Headaches, PSYCHIATRIC DISORDERS Anxiety Depression    GI/Hepatic GERD  ,  Endo/Other  Hypothyroidism   Renal/GU      Musculoskeletal  (+) Arthritis ,   Abdominal   Peds  Hematology   Anesthesia Other Findings   Reproductive/Obstetrics                             Anesthesia Physical Anesthesia Plan  ASA: II  Anesthesia Plan: General   Post-op Pain Management:    Induction: Intravenous  PONV Risk Score and Plan: 4 or greater and Ondansetron, Dexamethasone, Midazolam, Scopolamine patch - Pre-op and Treatment may vary due to age or medical condition  Airway Management Planned: LMA  Additional Equipment:   Intra-op Plan:   Post-operative Plan:   Informed Consent: I have reviewed the patients History and Physical, chart, labs and discussed the procedure including the risks, benefits and alternatives for the proposed anesthesia with the patient or authorized representative who has indicated his/her understanding and acceptance.     Plan Discussed with: CRNA, Anesthesiologist and Surgeon  Anesthesia Plan Comments:         Anesthesia Quick Evaluation

## 2017-10-25 NOTE — H&P (Signed)
Dana Washington is an 42 y.o. female.   Chief Complaint: left knee pain HPI: Patient presents with joint discomfort that had been persistent for several weeks now. Despite conservative treatments, the patients discomfort has not improved. Imaging was obtained. Other conservative and surgical treatments were discussed in detail. Patient wishes to proceed with surgery as consented. Denies SOB, CP, or calf pain. No Fever, chills, or nausea/ vomiting.   Past Medical History:  Diagnosis Date  . Anxiety   . Constipation   . GERD (gastroesophageal reflux disease)   . Glucose intolerance (impaired glucose tolerance)   . Herpes dermatitis   . History of motion sickness   . Hx of Hashimoto thyroiditis   . Hyperlipidemia   . Hypertension   . Hypothyroidism, secondary   . Impingement syndrome of right shoulder   . Insomnia   . Metabolic syndrome   . Migraine   . OA (osteoarthritis)    RIGHT SHOULDER AC JOINT  . Obese   . OCD (obsessive compulsive disorder)   . PONV (postoperative nausea and vomiting)    SEVERE after breast reduction  . TMJ (dislocation of temporomandibular joint)   . Vitamin D deficiency     Past Surgical History:  Procedure Laterality Date  . biopsy of lymph node  FEB 2010   BENIGN  . BREAST BIOPSY Bilateral   . BREAST EXCISIONAL BIOPSY Left   . BREAST REDUCTION SURGERY Bilateral MAY 2010  . CARPAL TUNNEL RELEASE Bilateral 2012  . EXCISION MASS ABDOMINAL N/A 04/14/2017   Procedure: EXCISION OF SUBCUTANEOUS LIPOMA OF LEFT UPPER ABDOMINAL WALL;  Surgeon: Donnie Mesa, MD;  Location: [facility];  Service: General;  Laterality: N/A;  . LASIK  2008  . REDUCTION MAMMAPLASTY Bilateral   . SHOULDER ARTHROSCOPY WITH ROTATOR CUFF REPAIR AND SUBACROMIAL DECOMPRESSION Right 09/19/2012   Procedure: RIGHT SHOULDER EXAMINE UNDER ANESTHESIA, ARTHROSCOPY,  DEBRIDEMENT, SUBACROMIAL DECOMPRESSION, DISTAL CLAVICLE RESECTION AND  ROTATOR CUFF REPAIR, LABRAL REPAIR ;   Surgeon: Sydnee Cabal, MD;  Location: [facility];  Service: Orthopedics;  Laterality: Right;  . WISDOM TOOTH EXTRACTION  AGE 62    Family History  Problem Relation Age of Onset  . Heart disease Maternal Grandfather   . Heart disease Paternal Grandmother    Social History:  reports that she has never smoked. She has never used smokeless tobacco. She reports that she drinks alcohol. She reports that she does not use drugs.  Allergies:  Allergies  Allergen Reactions  . Bee Venom Hives    Medications Prior to Admission  Medication Sig Dispense Refill  . atorvastatin (LIPITOR) 20 MG tablet TAKE 1 TABLET BY MOUTH ONCE DAILY 90 tablet 3  . benazepril-hydrochlorthiazide (LOTENSIN HCT) 20-12.5 MG tablet TAKE 1 TABLET BY MOUTH ONCE DAILY 90 tablet 3  . Cholecalciferol (VITAMIN D) 2000 units tablet Take 1,000 Units by mouth daily.    . diclofenac sodium (VOLTAREN) 1 % GEL Voltaren 1 % topical gel  APPLY 4 GRAM TO THE AFFECTED AREA(S) BY TOPICAL ROUTE 4 TIMES PER DAY    . esomeprazole (NEXIUM) 40 MG capsule TAKE 1 CAPSULE BY MOUTH ONCE DAILY 90 capsule 3  . FLUoxetine (PROZAC) 20 MG capsule TAKE 1 CAPSULE BY MOUTH ONCE DAILY 90 capsule 3  . ibuprofen (ADVIL,MOTRIN) 800 MG tablet Take 800 mg by mouth 3 (three) times daily.    Marland Kitchen LARIN FE 1.5/30 1.5-30 MG-MCG tablet TAKE 1 TABLET BY MOUTH ONCE DAILY 28 tablet 11  . metFORMIN (GLUCOPHAGE) 500  MG tablet TAKE 1 TABLET BY MOUTH TWICE A DAY WITH MEALS (Patient taking differently: 500 mg daily. ) 180 tablet 3  . polyethylene glycol powder (GLYCOLAX/MIRALAX) powder DISSOLVE 17 GRAMS (1 CAPFUL) IN LIQUID AND TAKE BY MOUTH DAILY AS DIRECTED 3162 g 1  . SYNTHROID 100 MCG tablet TAKE 1 TABLET BY MOUTH ONCE DAILY 90 tablet 0  . topiramate (TOPAMAX) 100 MG tablet TAKE 1 TABLET BY MOUTH EVERY NIGHT AT BEDTIME (Patient taking differently: daily. ) 90 tablet 3  . albuterol (PROVENTIL HFA;VENTOLIN HFA) 108 (90 BASE) MCG/ACT inhaler Inhale 2 puffs  into the lungs every 6 (six) hours as needed for wheezing or shortness of breath. 1 Inhaler 2  . ALPRAZolam (XANAX) 0.5 MG tablet Take 1 tablet (0.5 mg total) by mouth 2 (two) times daily as needed for anxiety. 60 tablet 0  . butalbital-acetaminophen-caffeine (FIORICET, ESGIC) 50-325-40 MG tablet Take 1 tablet by mouth 2 (two) times daily as needed for headache. 30 tablet 0  . fluconazole (DIFLUCAN) 150 MG tablet TAKE 1 TABLET BY MOUTH AS A SINGLE DOSE (Patient taking differently: TAKE 1 TABLET BY MOUTH AS A SINGLE DOSE; as needed for yeast infections) 1 tablet 1    Results for orders placed or performed during the hospital encounter of 10/25/17 (from the past 48 hour(s))  I-STAT, chem 8     Status: Abnormal   Collection Time: 10/25/17 12:23 PM  Result Value Ref Range   Sodium 141 135 - 145 mmol/L   Potassium 3.8 3.5 - 5.1 mmol/L   Chloride 108 98 - 111 mmol/L   BUN 18 6 - 20 mg/dL   Creatinine, Ser 0.80 0.44 - 1.00 mg/dL   Glucose, Bld 92 70 - 99 mg/dL   Calcium, Ion 1.25 1.15 - 1.40 mmol/L   TCO2 20 (L) 22 - 32 mmol/L   Hemoglobin 14.3 12.0 - 15.0 g/dL   HCT 42.0 36.0 - 46.0 %  Pregnancy, urine POC     Status: None   Collection Time: 10/25/17 12:51 PM  Result Value Ref Range   Preg Test, Ur NEGATIVE NEGATIVE    Comment:        THE SENSITIVITY OF THIS METHODOLOGY IS >24 mIU/mL    No results found.  Review of Systems  Constitutional: Negative.   HENT: Negative.   Eyes: Negative.   Respiratory: Negative.   Cardiovascular: Negative.   Gastrointestinal: Negative.   Genitourinary: Negative.   Musculoskeletal: Positive for joint pain.  Skin: Negative.   Neurological: Negative.   Endo/Heme/Allergies: Negative.   Psychiatric/Behavioral: Negative.     Blood pressure (!) 153/92, pulse 71, temperature 98.6 F (37 C), temperature source Oral, resp. rate 17, height 5' 5.5" (1.664 m), weight 104.1 kg, SpO2 99 %. Physical Exam  Constitutional: She is oriented to person, place, and  time. She appears well-developed.  HENT:  Head: Normocephalic.  Eyes: EOM are normal.  Neck: Normal range of motion.  Cardiovascular: Normal rate and intact distal pulses.  Respiratory: Effort normal.  GI: Soft.  Genitourinary:  Genitourinary Comments: deferred  Musculoskeletal:  Left knee pain with ROM. Limited strength. Knee is stable.  Neurological: She is alert and oriented to person, place, and time.  Skin: Skin is warm and dry.  Psychiatric: Her behavior is normal.     Assessment/Plan Left knee OA and TM: Left knee scope as consented D/c home F/u in office Follow instructions ASA BID   Anival Pasha L, PA-C 10/25/2017, 4:13 PM

## 2017-10-25 NOTE — Anesthesia Procedure Notes (Signed)
Procedure Name: LMA Insertion Date/Time: 10/25/2017 4:34 PM Performed by: Suzette Battiest, MD Pre-anesthesia Checklist: Patient identified, Emergency Drugs available, Suction available and Patient being monitored Patient Re-evaluated:Patient Re-evaluated prior to induction Oxygen Delivery Method: Circle system utilized Preoxygenation: Pre-oxygenation with 100% oxygen Induction Type: IV induction Ventilation: Mask ventilation without difficulty LMA: LMA inserted LMA Size: 4.0 Number of attempts: 1 Airway Equipment and Method: Bite block Placement Confirmation: positive ETCO2 Tube secured with: Tape Dental Injury: Teeth and Oropharynx as per pre-operative assessment

## 2017-10-25 NOTE — Op Note (Signed)
NAME: Dana Washington, Dana Washington. MEDICAL RECORD LN:[id number] ACCOUNT [id number] DATE OF BIRTH:21-May-1975 FACILITY: [facility] LOCATION: [facility] PHYSICIAN:Azaan Leask Gwinda Passe, MD  OPERATIVE REPORT  DATE OF PROCEDURE:  10/25/2017  PREOPERATIVE DIAGNOSES: 1.  Left knee torn meniscus. 2.  Early osteoarthritis.  POSTOPERATIVE DIAGNOSES: 1.  Left knee posterior horn tear medial meniscus. 2.  Posterior horn tear lateral meniscus. 3.  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  PROCEDURE: 1.  Left knee arthroscopic partial medial meniscectomy and partial lateral meniscectomy. 2.  Chondroplasty medial femoral condyle and patella.  SURGEON:  Audree Camel. Theda Sers, MD   ASSISTANT:  Wyatt Portela, PA-C  ANESTHESIA:  General and intraoperative knee block.  ESTIMATED BLOOD LOSS:  Minimal.  DRAINS:  None.  COMPLICATIONS:  None.  TOURNIQUET TIME:  30 minutes at 300 mmHg.  DISPOSITION:  PACU stable.  OPERATIVE DETAILS:  The patient's family was counseled in the holding area.  Correct site was identified, marked and signed appropriately.  IV was started.  Antibiotics were given.  TED hose were applied to uninvolved leg.  The patient was taken to the  operating room and placed in supine position under general anesthesia.  The left lower extremity was elevated, prepped with DuraPrep and draped in a sterile fashion.  Time-out was done confirming the left side,  exsanguinated with an Esmarch, and  tourniquet was inflated to 300 mmHg.  Arthroscopic portals were established, proximal medial, inferomedial, and inferolateral after time-out.  The patella had grade III chondromalacia, and light chondroplasty was performed with a shaver back to stable  base.  The femoral trochlea showed some grade III chondromalacia.  A  chondroplasty was performed there also.  ACL and PCL were intact.  The suprapatellar pouch, medial and lateral gutters were unremarkable.  Lateral side inspected.   Articular cartilage  showed mild fibrillation, but there was a radial tear of posterior horn medial meniscus and the lateral meniscus.  With a shaver and cautery system, a partial lateral meniscectomy was performed back to a stable base.  Medial side grade III chondromalacia  of medial femoral condyle, chondroplasty with a mechanical shaver back to stable base.  Posterior horn tear of medial meniscus.  Utilizing a basket shaver and cautery, a partial meniscectomy was performed back to stable base with proper bevel and  contour.  Again, ACL and PCL were intact.  There were no other abnormalities noted.  There were no loose fragments.  The knee was copiously irrigated and arthroscope equipment was removed.  The portal was closed with 1 suture.  Ten mL of Marcaine was  placed in the skin.  A sterile dressing was applied to the knee.  TED hose, ice and intra-articular.  TED hose applied.  Ice pack and full extension.  No complications or problems.  She was then awakened and taken from the operating room to the PACU in  stable condition.  She will be stabilized in PACU and discharged home.  She will be seen in our office in a few days.  A short course of rehab.  Prognosis is very good.  Glucosamine will also be recommended.  LN/NUANCE  D:10/25/2017 T:10/25/2017 JOB:[id number]

## 2017-10-25 NOTE — Discharge Instructions (Signed)

## 2017-10-26 ENCOUNTER — Encounter (HOSPITAL_BASED_OUTPATIENT_CLINIC_OR_DEPARTMENT_OTHER): Payer: Self-pay | Admitting: Specialist

## 2017-11-03 ENCOUNTER — Ambulatory Visit: Payer: No Typology Code available for payment source

## 2017-11-04 ENCOUNTER — Ambulatory Visit
Admission: RE | Admit: 2017-11-04 | Discharge: 2017-11-04 | Disposition: A | Discharge status: Home / Self Care | Payer: No Typology Code available for payment source | Source: Ambulatory Visit | Attending: Internal Medicine | Admitting: Internal Medicine

## 2017-11-04 DIAGNOSIS — Z1231 Encounter for screening mammogram for malignant neoplasm of breast: Secondary | ICD-10-CM

## 2017-11-21 ENCOUNTER — Other Ambulatory Visit: Payer: Self-pay | Admitting: Internal Medicine

## 2017-11-21 ENCOUNTER — Encounter: Payer: Self-pay | Admitting: Internal Medicine

## 2017-11-21 ENCOUNTER — Telehealth: Payer: Self-pay | Admitting: Internal Medicine

## 2017-11-21 MED ORDER — ONDANSETRON HCL 4 MG PO TABS: 4.0000 mg | ORAL_TABLET | Freq: Three times a day (TID) | ORAL | 0 refills | Status: DC | PRN

## 2017-11-21 MED FILL — ONDANSETRON HCL 4 MG TABLET: 4 | 6 days supply | Qty: 20 | Fill #0

## 2017-11-21 MED FILL — ESOMEPRAZOLE MAG DR 40 MG C: 40 | 90 days supply | Qty: 90 | Fill #2

## 2017-11-21 MED FILL — metFORMIN HCL 500 MG TABS: 500 | 90 days supply | Qty: 180 | Fill #0

## 2017-11-21 NOTE — Telephone Encounter (Signed)
Patient called with migraine headache.  Has nausea and pain.  Tried half of Fioricet but that did not knock headache out.  Had to leave work.:  Zofran tablets for nausea and after 1 hour try Norco and prednisone 40 mg p.o.

## 2017-12-12 MED FILL — BENAZEPRIL-HCTZ 20-12.5 MG: 20-12.5 | 90 days supply | Qty: 90 | Fill #1

## 2017-12-19 ENCOUNTER — Other Ambulatory Visit: Payer: Self-pay | Admitting: Internal Medicine

## 2017-12-19 MED FILL — SYNTHROID 100 MCG TABLET: 100 | 90 days supply | Qty: 90 | Fill #0

## 2017-12-22 ENCOUNTER — Ambulatory Visit (INDEPENDENT_AMBULATORY_CARE_PROVIDER_SITE_OTHER): Payer: No Typology Code available for payment source

## 2017-12-22 ENCOUNTER — Other Ambulatory Visit: Payer: Self-pay | Admitting: Podiatry

## 2017-12-22 ENCOUNTER — Encounter: Payer: Self-pay | Admitting: Podiatry

## 2017-12-22 ENCOUNTER — Ambulatory Visit: Payer: No Typology Code available for payment source | Admitting: Podiatry

## 2017-12-22 DIAGNOSIS — M79672 Pain in left foot: Secondary | ICD-10-CM

## 2017-12-22 DIAGNOSIS — M779 Enthesopathy, unspecified: Principal | ICD-10-CM

## 2017-12-22 DIAGNOSIS — M778 Other enthesopathies, not elsewhere classified: Secondary | ICD-10-CM

## 2017-12-22 MED ORDER — TRIAMCINOLONE ACETONIDE 10 MG/ML IJ SUSP
10.0000 mg | Freq: Once | INTRAMUSCULAR | Status: AC
  Administered 2017-12-22: 10 mg

## 2017-12-22 NOTE — Progress Notes (Signed)
Subjective:   Patient ID: Dana Washington, female   DOB: 42 y.o.   MRN: [id number]   HPI Patient presents stating she is having pain in his left ankle over the last couple weeks and it feels like it swollen and its hard for her to be real active.  She has had knee surgery and is ready to start resuming activity but this is inhibiting her   ROS      Objective:  Physical Exam  Neurovascular status intact with patient's left sinus tarsi found to be inflamed with fluid buildup with edema within the ankle negative Homans sign noted     Assessment:  Sinus tarsitis left with acute inflammation along with moderate but chronic swelling of the ankle     Plan:  Sterile prep the left and injected the sinus tarsi 3 mg Kenalog 5 mg Xylocaine and applied ankle compression stocking to reduce swelling and reappoint if symptoms persist  X-rays indicate no indications of sinus tarsi pathology but does indicate quite a bit of swelling and spurring of the dorsal midfoot

## 2018-01-02 ENCOUNTER — Other Ambulatory Visit: Payer: Self-pay | Admitting: Internal Medicine

## 2018-01-02 MED FILL — LARIN FE 1.5-30 TABLET: 1.5-30 | 84 days supply | Qty: 84 | Fill #4

## 2018-01-02 MED FILL — TOPIRAMATE 100 MG TABLET: 100 | 90 days supply | Qty: 90 | Fill #0

## 2018-01-17 MED FILL — ATORVASTATIN CALCIUM 20 MG: 20 | 90 days supply | Qty: 90 | Fill #3

## 2018-01-17 MED FILL — FLUoxetine HCL 20 MG CAPS: 20 | 90 days supply | Qty: 90 | Fill #2

## 2018-02-27 ENCOUNTER — Other Ambulatory Visit: Payer: Self-pay | Admitting: Internal Medicine

## 2018-02-27 MED FILL — ESOMEPRAZOLE MAG DR 40 MG C: 40 | 90 days supply | Qty: 90 | Fill #3

## 2018-02-27 MED FILL — SYNTHROID 100 MCG TABLET: 100 | 90 days supply | Qty: 90 | Fill #0

## 2018-02-27 MED FILL — metFORMIN HCL 500 MG TABS: 500 | 90 days supply | Qty: 180 | Fill #1

## 2018-03-14 MED FILL — BENAZEPRIL-HYDROCHLOROTHIAZ: 20-12.5 | 90 days supply | Qty: 90 | Fill #2

## 2018-03-29 ENCOUNTER — Encounter: Payer: Self-pay | Admitting: Family

## 2018-03-29 ENCOUNTER — Telehealth: Payer: No Typology Code available for payment source | Admitting: Family

## 2018-03-29 DIAGNOSIS — R05 Cough: Secondary | ICD-10-CM

## 2018-03-29 DIAGNOSIS — R059 Cough, unspecified: Secondary | ICD-10-CM

## 2018-03-29 NOTE — Progress Notes (Signed)
We are sorry that you are not feeling well.  Here is how we plan to help!  Based on your presentation I believe you most likely have A cough due to allergies.  I recommend that you start the an over-the counter-allergy medication such as Claritin 10 mg or Zyrtec 10 mg daily.     In addition you may use A non-prescription cough medication called Robitussin DAC. Take 2 teaspoons every 8 hours or Delsym: take 2 teaspoons every 12 hours.   From your responses in the eVisit questionnaire you describe inflammation in the upper respiratory tract which is causing a significant cough.  This is commonly called Bronchitis and has four common causes:    Allergies  Viral Infections  Acid Reflux  Bacterial Infection Allergies, viruses and acid reflux are treated by controlling symptoms or eliminating the cause. An example might be a cough caused by taking certain blood pressure medications. You stop the cough by changing the medication. Another example might be a cough caused by acid reflux. Controlling the reflux helps control the cough.  USE OF BRONCHODILATOR ("RESCUE") INHALERS: There is a risk from using your bronchodilator too frequently.  The risk is that over-reliance on a medication which only relaxes the muscles surrounding the breathing tubes can reduce the effectiveness of medications prescribed to reduce swelling and congestion of the tubes themselves.  Although you feel brief relief from the bronchodilator inhaler, your asthma may actually be worsening with the tubes becoming more swollen and filled with mucus.  This can delay other crucial treatments, such as oral steroid medications. If you need to use a bronchodilator inhaler daily, several times per day, you should discuss this with your provider.  There are probably better treatments that could be used to keep your asthma under control.     HOME CARE . Only take medications as instructed by your medical team. . Complete the entire course  of an antibiotic. . Drink plenty of fluids and get plenty of rest. . Avoid close contacts especially the very young and the elderly . Cover your mouth if you cough or cough into your sleeve. . Always remember to wash your hands . A steam or ultrasonic humidifier can help congestion.   GET HELP RIGHT AWAY IF: . You develop worsening fever. . You become short of breath . You cough up blood. . Your symptoms persist after you have completed your treatment plan MAKE SURE YOU   Understand these instructions.  Will watch your condition.  Will get help right away if you are not doing well or get worse.  Your e-visit answers were reviewed by a board certified advanced clinical practitioner to complete your personal care plan.  Depending on the condition, your plan could have included both over the counter or prescription medications. If there is a problem please reply  once you have received a response from your provider. Your safety is important to [place].  If you have drug allergies check your prescription carefully.    You can use MyChart to ask questions about today's visit, request a non-urgent call back, or ask for a work or school excuse for 24 hours related to this e-Visit. If it has been greater than 24 hours you will need to follow up with your provider, or enter a new e-Visit to address those concerns. You will get an e-mail in the next two days asking about your experience.  I hope that your e-visit has been valuable and will speed your recovery. Thank  you for using e-visits.   

## 2018-04-03 ENCOUNTER — Telehealth: Payer: Self-pay | Admitting: Internal Medicine

## 2018-04-03 ENCOUNTER — Encounter: Payer: Self-pay | Admitting: Internal Medicine

## 2018-04-03 ENCOUNTER — Other Ambulatory Visit: Payer: Self-pay

## 2018-04-03 ENCOUNTER — Ambulatory Visit (INDEPENDENT_AMBULATORY_CARE_PROVIDER_SITE_OTHER): Payer: No Typology Code available for payment source | Admitting: Internal Medicine

## 2018-04-03 VITALS — BP 120/80 | HR 102 | Temp 98.5°F | Ht 65.0 in | Wt 225.0 lb

## 2018-04-03 DIAGNOSIS — R0981 Nasal congestion: Secondary | ICD-10-CM | POA: Diagnosis not present

## 2018-04-03 DIAGNOSIS — H6692 Otitis media, unspecified, left ear: Secondary | ICD-10-CM | POA: Diagnosis not present

## 2018-04-03 DIAGNOSIS — J22 Unspecified acute lower respiratory infection: Secondary | ICD-10-CM | POA: Diagnosis not present

## 2018-04-03 DIAGNOSIS — R05 Cough: Secondary | ICD-10-CM | POA: Diagnosis not present

## 2018-04-03 DIAGNOSIS — R059 Cough, unspecified: Secondary | ICD-10-CM

## 2018-04-03 LAB — POCT INFLUENZA A/B
INFLUENZA A, POC: NEGATIVE
Influenza B, POC: NEGATIVE

## 2018-04-03 MED ORDER — LEVOFLOXACIN 500 MG PO TABS: 500.0000 mg | ORAL_TABLET | Freq: Every day | ORAL | 0 refills | Status: DC

## 2018-04-03 MED ORDER — FLUCONAZOLE 150 MG PO TABS: 150.0000 mg | ORAL_TABLET | Freq: Once | ORAL | 1 refills | Status: AC

## 2018-04-03 MED ORDER — HYDROCODONE-HOMATROPINE 5-1.5 MG/5ML PO SYRP: 5.0000 mL | ORAL_SOLUTION | Freq: Three times a day (TID) | ORAL | 0 refills | Status: DC | PRN

## 2018-04-03 MED FILL — FLUoxetine HCL 20 MG CAPS: 20 | 90 days supply | Qty: 90 | Fill #3

## 2018-04-03 MED FILL — LARIN FE 1.5-30 TABLET: 1.5-30 | 84 days supply | Qty: 84 | Fill #0

## 2018-04-03 MED FILL — TOPIRAMATE 100 MG TABLET: 100 | 90 days supply | Qty: 90 | Fill #1

## 2018-04-03 NOTE — Telephone Encounter (Signed)
Coming at 4:30pm

## 2018-04-03 NOTE — Telephone Encounter (Addendum)
Dana Washington [phone]   [name] called to see if she could get an appointment for cough, phlegm that was yellow now turning greenish and she has lump in her throat. No fever, no travel, but she does work at [facility] [employer] [facility] [employer]. She did do an evisit a couple days ago.

## 2018-04-03 NOTE — Progress Notes (Signed)
   Subjective:    Patient ID: Dana Washington, female    DOB: 02/06/1975, 43 y.o.   MRN: [id number]  HPI 43 year old Female with cough and congestion onset last Tuesday. Had flu vaccine through employment. No documented fever or chills. She thought maybe it was allergic rhinitis. Had e-visit March 18. No antibiotics prescribed. Now has hacking cough, sinus pressure, and some discolored sputum. Works as a Psychologist, counselling at [employer]. No recent travel history.    Review of Systems see above     Objective:   Physical Exam Pulse 102 BP 120/80. Pulse ox 98% T 98.5 degrees Skin:  Warm and dry.Nodes none. Neck supple. Chest clear without rales or wheezing but continuous coughing in exam room. Left TM red and full. Right TM normal.Pharynx slightly injected without exudate.  Rapid flu test and Respiratory virus panels done. Rapid flu test is negative    Assessment & Plan:  Acute lower respiratory infection  Acute left otitis media  Plan: Levaquin 500 mg daily x 10 days. Hycodan one tsp po q 8 hours prn cough.Diflucan if needed for Candida vaginitis. Out of work x 2 days. Note provided.

## 2018-04-03 NOTE — Patient Instructions (Signed)
Rapid flu test is negative.  Respiratory virus panel is pending.  Levaquin 500 mg daily for 10 days.  Hycodan 1 teaspoon p.o. every 8 hours as needed cough.  Diflucan if needed for Candida vaginitis.  Rest and drink plenty of fluids.  Out of work for 2 days.  Note provided.

## 2018-04-04 LAB — RESPIRATORY VIRUS PANEL
ADENOVIRUS B: NOT DETECTED
HUMAN PARAINFLU VIRUS 1: NOT DETECTED
HUMAN PARAINFLU VIRUS 2: NOT DETECTED
HUMAN PARAINFLU VIRUS 3: DETECTED — AB
INFLUENZA A SUBTYPE H1: NOT DETECTED
INFLUENZA A SUBTYPE H3: NOT DETECTED
INFLUENZA B 1: NOT DETECTED
Influenza A: NOT DETECTED
METAPNEUMOVIRUS: NOT DETECTED
RESPIRATORY SYNCYTIAL VIRUS A: NOT DETECTED
RESPIRATORY SYNCYTIAL VIRUS B: NOT DETECTED
Rhinovirus: NOT DETECTED

## 2018-04-04 MED FILL — FLUCONAZOLE 150 MG TABS: 150 | 1 days supply | Qty: 1 | Fill #0

## 2018-04-04 MED FILL — levoFLOXacin 500 MG TABS: 500 | 10 days supply | Qty: 10 | Fill #0

## 2018-04-04 MED FILL — HYDROCODONE-HOMATROPINE SOL: 5-1.5 | 40 days supply | Qty: 120 | Fill #0

## 2018-04-10 ENCOUNTER — Other Ambulatory Visit: Payer: Self-pay

## 2018-04-10 ENCOUNTER — Other Ambulatory Visit: Payer: No Typology Code available for payment source | Admitting: Internal Medicine

## 2018-04-10 DIAGNOSIS — E782 Mixed hyperlipidemia: Secondary | ICD-10-CM

## 2018-04-10 DIAGNOSIS — E785 Hyperlipidemia, unspecified: Secondary | ICD-10-CM

## 2018-04-10 DIAGNOSIS — Z0000 Encounter for general adult medical examination without abnormal findings: Secondary | ICD-10-CM

## 2018-04-10 DIAGNOSIS — I10 Essential (primary) hypertension: Secondary | ICD-10-CM

## 2018-04-10 DIAGNOSIS — F329 Major depressive disorder, single episode, unspecified: Secondary | ICD-10-CM

## 2018-04-10 DIAGNOSIS — F429 Obsessive-compulsive disorder, unspecified: Secondary | ICD-10-CM

## 2018-04-10 DIAGNOSIS — D171 Benign lipomatous neoplasm of skin and subcutaneous tissue of trunk: Secondary | ICD-10-CM

## 2018-04-10 DIAGNOSIS — E039 Hypothyroidism, unspecified: Secondary | ICD-10-CM

## 2018-04-10 DIAGNOSIS — F32A Depression, unspecified: Secondary | ICD-10-CM

## 2018-04-10 DIAGNOSIS — E063 Autoimmune thyroiditis: Secondary | ICD-10-CM

## 2018-04-10 DIAGNOSIS — R7302 Impaired glucose tolerance (oral): Secondary | ICD-10-CM

## 2018-04-10 DIAGNOSIS — F419 Anxiety disorder, unspecified: Secondary | ICD-10-CM

## 2018-04-11 LAB — COMPLETE METABOLIC PANEL WITH GFR
AG Ratio: 1.7 (calc) (ref 1.0–2.5)
ALT: 13 U/L (ref 6–29)
AST: 14 U/L (ref 10–30)
Albumin: 4.5 g/dL (ref 3.6–5.1)
Alkaline phosphatase (APISO): 79 U/L (ref 31–125)
BUN: 13 mg/dL (ref 7–25)
CALCIUM: 9.6 mg/dL (ref 8.6–10.2)
CHLORIDE: 105 mmol/L (ref 98–110)
CO2: 27 mmol/L (ref 20–32)
Creat: 0.91 mg/dL (ref 0.50–1.10)
GFR, EST AFRICAN AMERICAN: 89 mL/min/{1.73_m2} (ref >=60)
GFR, EST NON AFRICAN AMERICAN: 77 mL/min/{1.73_m2} (ref >=60)
Globulin: 2.7 g/dL (calc) (ref 1.9–3.7)
Glucose, Bld: 83 mg/dL (ref 65–99)
Potassium: 4.8 mmol/L (ref 3.5–5.3)
Sodium: 139 mmol/L (ref 135–146)
TOTAL PROTEIN: 7.2 g/dL (ref 6.1–8.1)
Total Bilirubin: 0.4 mg/dL (ref 0.2–1.2)

## 2018-04-11 LAB — HEMOGLOBIN A1C
HEMOGLOBIN A1C: 5.6 %{Hb} (ref <5.7)
MEAN PLASMA GLUCOSE: 114 (calc)
eAG (mmol/L): 6.3 (calc)

## 2018-04-11 LAB — CBC WITH DIFFERENTIAL/PLATELET
Absolute Monocytes: 432 cells/uL (ref 200–950)
BASOS PCT: 1.3 %
Basophils Absolute: 94 cells/uL (ref 0–200)
Eosinophils Absolute: 79 cells/uL (ref 15–500)
Eosinophils Relative: 1.1 %
HEMATOCRIT: 46.3 % — AB (ref 35.0–45.0)
Hemoglobin: 15.3 g/dL (ref 11.7–15.5)
LYMPHS ABS: 2110 {cells}/uL (ref 850–3900)
MCH: 30.4 pg (ref 27.0–33.0)
MCHC: 33 g/dL (ref 32.0–36.0)
MCV: 92 fL (ref 80.0–100.0)
MPV: 9.2 fL (ref 7.5–12.5)
Monocytes Relative: 6 %
NEUTROS PCT: 62.3 %
Neutro Abs: 4486 cells/uL (ref 1500–7800)
Platelets: 424 10*3/uL — ABNORMAL HIGH (ref 140–400)
RBC: 5.03 10*6/uL (ref 3.80–5.10)
RDW: 13.1 % (ref 11.0–15.0)
Total Lymphocyte: 29.3 %
WBC: 7.2 10*3/uL (ref 3.8–10.8)

## 2018-04-11 LAB — LIPID PANEL
Cholesterol: 208 mg/dL — ABNORMAL HIGH (ref <200)
HDL: 60 mg/dL (ref >=50)
LDL CHOLESTEROL (CALC): 123 mg/dL — AB
NON-HDL CHOLESTEROL (CALC): 148 mg/dL — AB (ref <130)
TRIGLYCERIDES: 139 mg/dL (ref <150)
Total CHOL/HDL Ratio: 3.5 (calc) (ref <5.0)

## 2018-04-11 LAB — VITAMIN D 25 HYDROXY (VIT D DEFICIENCY, FRACTURES): Vit D, 25-Hydroxy: 33 ng/mL (ref 30–100)

## 2018-04-11 LAB — TSH: TSH: 0.45 m[IU]/L

## 2018-04-14 ENCOUNTER — Encounter: Payer: No Typology Code available for payment source | Admitting: Internal Medicine

## 2018-04-22 MED FILL — FLUCONAZOLE 150 MG TABS: 150 | 1 days supply | Qty: 1 | Fill #1

## 2018-05-08 ENCOUNTER — Ambulatory Visit (INDEPENDENT_AMBULATORY_CARE_PROVIDER_SITE_OTHER): Payer: No Typology Code available for payment source | Admitting: Internal Medicine

## 2018-05-08 ENCOUNTER — Encounter: Payer: Self-pay | Admitting: Internal Medicine

## 2018-05-08 DIAGNOSIS — M791 Myalgia, unspecified site: Secondary | ICD-10-CM

## 2018-05-08 DIAGNOSIS — T466X5A Adverse effect of antihyperlipidemic and antiarteriosclerotic drugs, initial encounter: Secondary | ICD-10-CM

## 2018-05-08 MED ORDER — ROSUVASTATIN CALCIUM 5 MG PO TABS: 5.0000 mg | ORAL_TABLET | Freq: Every day | ORAL | 3 refills | Status: DC

## 2018-05-08 MED FILL — ROSUVASTATIN CALCIUM 5 MG T: 5 | 90 days supply | Qty: 90 | Fill #0

## 2018-05-08 NOTE — Patient Instructions (Signed)
Stop Lipitor.  Try Crestor 5 mg daily follow-up in approximately 6 weeks.

## 2018-05-08 NOTE — Progress Notes (Signed)
   Subjective:    Patient ID: Dana Washington, female    DOB: August 06, 1975, 43 y.o.   MRN: [id number]  HPI I received a My chart note this morning regarding development follow-up myalgias while on Lipitor and Biaxin for a cough in March.  She called the pharmacy and was told it could be an interaction so she stopped Lipitor.  The muscle aches resolved almost immediately.  Now she has restarted Lipitor and is getting.  When she stops the Lipitor it resolves.  Has not previously had issues with Lipitor.  Longstanding history of hyperlipidemia.  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  Interactive audio and video telecommunications were achieved between this provider and patient.  Patient consented to this visit format today due to the coronavirus pandemic.  She was identified by 2 identifiers as Dana Washington for the patient in this practice    Review of Systems see above  She would like to try another statin medication     Objective:   Physical Exam  Seen by virtual visit.  Not examined.  No acute distress today.      Assessment & Plan:  Myalgias-?  Induced by statin medication versus musculoskeletal pain.  There does seem to be a correlation with taking Biaxin and Lipitor and then restarting Lipitor.  Hyperlipidemia  Plan: Change to Crestor 5 mg daily and follow-up in 6 weeks with office visit lipid panel liver functions.

## 2018-05-26 NOTE — Progress Notes (Signed)
Greater than 5 minutes, yet less than 10 minutes of time have been spent researching, coordinating, and implementing care for this patient today.  Thank you for the details you included in the comment boxes. Those details are very helpful in determining the best course of treatment for you and help us to provide the best care.  

## 2018-06-06 ENCOUNTER — Other Ambulatory Visit: Payer: Self-pay | Admitting: Internal Medicine

## 2018-06-06 MED FILL — BENAZEPRIL-HYDROCHLOROTHIAZ: 20-12.5 | 90 days supply | Qty: 90 | Fill #3

## 2018-06-06 MED FILL — ESOMEPRAZOLE MAG DR 40 MG C: 40 | 90 days supply | Qty: 90 | Fill #0

## 2018-06-12 ENCOUNTER — Other Ambulatory Visit: Payer: Self-pay | Admitting: Internal Medicine

## 2018-06-12 MED FILL — SYNTHROID 100 MCG TABLET: 100 | 90 days supply | Qty: 90 | Fill #0

## 2018-06-14 ENCOUNTER — Ambulatory Visit (INDEPENDENT_AMBULATORY_CARE_PROVIDER_SITE_OTHER)
Admission: RE | Admit: 2018-06-14 | Discharge: 2018-06-14 | Disposition: A | Discharge status: Home / Self Care | Payer: No Typology Code available for payment source | Source: Ambulatory Visit

## 2018-06-14 ENCOUNTER — Telehealth: Payer: No Typology Code available for payment source

## 2018-06-14 DIAGNOSIS — K5900 Constipation, unspecified: Secondary | ICD-10-CM | POA: Diagnosis not present

## 2018-06-14 MED ORDER — MAGNESIUM CITRATE PO SOLN: 1.0000 | Freq: Once | ORAL | 1 refills | Status: AC

## 2018-06-14 NOTE — ED Provider Notes (Signed)
Virtual Visit via Video Note:  Dana Washington  initiated request for Telemedicine visit with [facility] team. I connected with Dana Washington  on 06/14/2018 at 11:58 AM  for a synchronized telemedicine visit using a video enabled HIPPA compliant telemedicine application. I verified that I am speaking with Dana Washington  using two identifiers. Orvan July, NP  was physically located in a [facility] Urgent care site and Dana Washington was located at a different location.   The limitations of evaluation and management by telemedicine as well as the availability of in-person appointments were discussed. Patient was informed that she  may incur a bill ( including co-pay) for this virtual visit encounter. Dana Washington  expressed understanding and gave verbal consent to proceed with virtual visit.     History of Present Illness:Dana Washington  is a 43 y.o. female presents with constipation, bloating, increased burping and upper abdominal pressure.  Patient has a longstanding history of constipation.  Reports that she has not had a good bowel movement in approximately 3 weeks.  She has been having small hard stools.  Denies any blood in stool.  No nausea, vomiting or diarrhea.  She has been taking MiraLAX and Benefiber and stool softeners without any relief.  Symptoms have worsened over the last 3 days.   Past Medical History:  Diagnosis Date  . Anxiety   . Constipation   . GERD (gastroesophageal reflux disease)   . Glucose intolerance (impaired glucose tolerance)   . Herpes dermatitis   . History of motion sickness   . Hx of Hashimoto thyroiditis   . Hyperlipidemia   . Hypertension   . Hypothyroidism, secondary   . Impingement syndrome of right shoulder   . Insomnia   . Metabolic syndrome   . Migraine   . OA (osteoarthritis)    RIGHT SHOULDER AC JOINT  . Obese   . OCD (obsessive compulsive disorder)   . PONV (postoperative nausea and vomiting)    SEVERE  after breast reduction  . TMJ (dislocation of temporomandibular joint)   . Vitamin D deficiency     Allergies  Allergen Reactions  . Bee Venom Hives        Observations/Objective:GENERAL APPEARANCE: Well developed, well nourished, alert and cooperative, and appears to be in no acute distress. HEAD: normocephalic. Non labored breathing, no dyspnea or distress Skin: Skin normal color  PSYCHIATRIC: The mental examination revealed the patient was oriented to person, place, and time. The patient was able to demonstrate good judgement and reason, without hallucinations, abnormal affect or abnormal behaviors during the examination. Patient is not suicidal     Assessment and Plan: Patient's symptoms consistent with constipation.  Not concerned for bowel obstruction at this time.  We will have her do magnesium citrate for constipation.  Instructed that if her symptoms worsen or she has no relief from the medication with good BM  she will need to go the ER.  Patient understanding and agreed   Follow Up Instructions:Follow up as needed for continued or worsening symptoms    I discussed the assessment and treatment plan with the patient. The patient was provided an opportunity to ask questions and all were answered. The patient agreed with the plan and demonstrated an understanding of the instructions.   The patient was advised to call back or seek an in-person evaluation if the symptoms worsen or if the condition fails to improve as anticipated.     Dana Washington  Vickki Hearing, NP  06/14/2018 11:58 AM         Orvan July, NP 06/14/18 [id number]

## 2018-06-14 NOTE — Discharge Instructions (Addendum)
Symptoms are consistent with constipation. Try the magnesium citrate for your symptoms, maximum 300 ml a day  Hopefully this will help you have a good bowel movement. You could also try an enema If you do not get any relief with all of these medications and you start developing more severe abdominal pain, nausea or vomiting you will need to go to the hospital for further evaluation and management.

## 2018-06-16 ENCOUNTER — Ambulatory Visit (INDEPENDENT_AMBULATORY_CARE_PROVIDER_SITE_OTHER): Payer: No Typology Code available for payment source | Admitting: Internal Medicine

## 2018-06-16 ENCOUNTER — Encounter: Payer: Self-pay | Admitting: Internal Medicine

## 2018-06-16 ENCOUNTER — Other Ambulatory Visit: Payer: Self-pay

## 2018-06-16 ENCOUNTER — Ambulatory Visit
Admission: RE | Admit: 2018-06-16 | Discharge: 2018-06-16 | Disposition: A | Discharge status: Home / Self Care | Payer: No Typology Code available for payment source | Source: Ambulatory Visit | Attending: Internal Medicine | Admitting: Internal Medicine

## 2018-06-16 VITALS — BP 108/80 | HR 118 | Ht 65.0 in | Wt 259.0 lb

## 2018-06-16 DIAGNOSIS — R109 Unspecified abdominal pain: Secondary | ICD-10-CM | POA: Diagnosis not present

## 2018-06-16 DIAGNOSIS — R1013 Epigastric pain: Secondary | ICD-10-CM | POA: Diagnosis not present

## 2018-06-16 DIAGNOSIS — R195 Other fecal abnormalities: Secondary | ICD-10-CM | POA: Diagnosis not present

## 2018-06-16 DIAGNOSIS — R8290 Unspecified abnormal findings in urine: Secondary | ICD-10-CM

## 2018-06-16 DIAGNOSIS — K5904 Chronic idiopathic constipation: Secondary | ICD-10-CM

## 2018-06-16 LAB — HEMOCCULT GUIAC POC 1CARD (OFFICE): Fecal Occult Blood, POC: NEGATIVE

## 2018-06-16 LAB — POCT URINALYSIS DIPSTICK
Bilirubin, UA: NEGATIVE
Glucose, UA: NEGATIVE
Ketones, UA: NEGATIVE
Nitrite, UA: NEGATIVE
Protein, UA: NEGATIVE
Spec Grav, UA: 1.015 (ref 1.010–1.025)
Urobilinogen, UA: 0.2 E.U./dL
pH, UA: 6 (ref 5.0–8.0)

## 2018-06-17 LAB — URINALYSIS, MICROSCOPIC ONLY
Hyaline Cast: NONE SEEN /LPF
RBC / HPF: NONE SEEN /HPF (ref 0–2)

## 2018-06-17 LAB — URINE CULTURE
MICRO NUMBER:: 541580
SPECIMEN QUALITY:: ADEQUATE

## 2018-06-23 ENCOUNTER — Other Ambulatory Visit: Payer: Self-pay

## 2018-06-23 ENCOUNTER — Encounter: Payer: Self-pay | Admitting: Internal Medicine

## 2018-06-23 ENCOUNTER — Ambulatory Visit (INDEPENDENT_AMBULATORY_CARE_PROVIDER_SITE_OTHER): Payer: No Typology Code available for payment source | Admitting: Internal Medicine

## 2018-06-23 VITALS — BP 130/90 | HR 124 | Ht 65.0 in | Wt 250.0 lb

## 2018-06-23 VITALS — Ht 65.0 in | Wt 256.0 lb

## 2018-06-23 DIAGNOSIS — Z0000 Encounter for general adult medical examination without abnormal findings: Secondary | ICD-10-CM | POA: Diagnosis not present

## 2018-06-23 DIAGNOSIS — E7800 Pure hypercholesterolemia, unspecified: Secondary | ICD-10-CM | POA: Diagnosis not present

## 2018-06-23 DIAGNOSIS — K5909 Other constipation: Secondary | ICD-10-CM | POA: Diagnosis not present

## 2018-06-23 DIAGNOSIS — K5904 Chronic idiopathic constipation: Secondary | ICD-10-CM

## 2018-06-23 DIAGNOSIS — F329 Major depressive disorder, single episode, unspecified: Secondary | ICD-10-CM

## 2018-06-23 DIAGNOSIS — I10 Essential (primary) hypertension: Secondary | ICD-10-CM

## 2018-06-23 DIAGNOSIS — R7302 Impaired glucose tolerance (oral): Secondary | ICD-10-CM

## 2018-06-23 DIAGNOSIS — R1013 Epigastric pain: Secondary | ICD-10-CM | POA: Diagnosis not present

## 2018-06-23 DIAGNOSIS — E8881 Metabolic syndrome: Secondary | ICD-10-CM

## 2018-06-23 DIAGNOSIS — K30 Functional dyspepsia: Secondary | ICD-10-CM | POA: Diagnosis not present

## 2018-06-23 DIAGNOSIS — F419 Anxiety disorder, unspecified: Secondary | ICD-10-CM

## 2018-06-23 DIAGNOSIS — Z6841 Body Mass Index (BMI) 40.0 and over, adult: Secondary | ICD-10-CM

## 2018-06-23 DIAGNOSIS — E039 Hypothyroidism, unspecified: Secondary | ICD-10-CM

## 2018-06-23 DIAGNOSIS — F32A Depression, unspecified: Secondary | ICD-10-CM

## 2018-06-23 DIAGNOSIS — Z8669 Personal history of other diseases of the nervous system and sense organs: Secondary | ICD-10-CM | POA: Diagnosis not present

## 2018-06-23 NOTE — Progress Notes (Signed)
TELEHEALTH ENCOUNTER IN SETTING OF COVID-19 PANDEMIC - REQUESTED BY PATIENT SERVICE PROVIDED BY TELEMEDECINE - TYPE: Zoom A/V PATIENT LOCATION: Work PATIENT HAS CONSENTED TO TELEHEALTH VISIT PROVIDER LOCATION: OFFICE REFERRING PROVIDER: Elby Showers, MD PARTICIPANTS OTHER THAN PATIENT: None TIME SPENT ON CALL: 21 minutes physician time    Dana Washington 43 y.o. [birth year] [id number]  Assessment & Plan:   Encounter Diagnoses  Name Primary?  . Abdominal pain, epigastric Yes  . Indigestion   . Chronic constipation     Given the temporal association with the change to Crestor and abdominal pain and pancreatitis being side effects of Crestor I think that is my leading etiology suspicion right now.  She will stop Crestor.  She is seeing Dr. Renold Washington today for a physical so I will asked that she have an amylase and lipase checked.  Though I can understand the treatment of the constipation that has not helped her, and she is was never really bothered by her once a week defecation pattern.  So she can stop the Linzess as well.  If she does not respond to stopping Crestor then an upper GI endoscopy would make sense.  We have reviewed the risks benefits and indications.  I appreciate the opportunity to care for this patient. 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  She has been on Tums and Nexium without help.  She suffers with chronic constipation with moving her bowels about once a week for most of her life.  That does not bother her.  However because of that issue she was treated for constipation with citrate of magnesia and then Linzess.  She is moved her bowels more but that has not relieved her symptoms.  There is been no unintentional weight loss.  She is eating healthy on a weight watchers diet.  She does admit that she has been  concerned and stressed about a distant cousin who was in her late 68s who was diagnosed with gastric cancer in 05-31-2022 and died just a few weeks ago.  That has been on her mind.  She does not have dysphagia.  No lower abdominal pain no signs of bleeding though she had some dark stools at one point it might of been when she was on Centrum with iron.  Hemoccult testing by Dr. Renold Washington on June 5 was negative.  She had a CT scan of the abdomen and pelvis in March 2019 because of left upper quadrant pain and some palpable subcutaneous lumps or nodules but that was negative other than moderate fecal retention within the left colon.  Eventually she had removal of a lipoma.   She did change from Lipitor to Crestor 3 weeks ago.  She did say that sonography of her abdomen did not show any masses in the pancreas at least in the head in the tail though that was not official procedure in radiology it was done using echo ultrasound probe.   Wt Readings from Last 3 Encounters:  06/23/18 256 lb (116.1 kg)  06/16/18 259 lb (117.5 kg)  04/03/18 225 lb (102.1 kg)    Allergies  Allergen Reactions  . Bee Venom Hives   Current Meds  Medication Sig  . ALPRAZolam (XANAX) 0.5 MG tablet Take 1 tablet (0.5 mg total) by mouth 2 (two) times daily as needed for anxiety.  Marland Kitchen  benazepril-hydrochlorthiazide (LOTENSIN HCT) 20-12.5 MG tablet TAKE 1 TABLET BY MOUTH ONCE DAILY  . butalbital-acetaminophen-caffeine (FIORICET, ESGIC) 50-325-40 MG tablet Take 1 tablet by mouth 2 (two) times daily as needed for headache.  . esomeprazole (NEXIUM) 40 MG capsule TAKE 1 CAPSULE BY MOUTH ONCE DAILY  . FLUoxetine (PROZAC) 20 MG capsule TAKE 1 CAPSULE BY MOUTH ONCE DAILY  . LARIN FE 1.5/30 1.5-30 MG-MCG tablet TAKE 1 TABLET BY MOUTH ONCE DAILY  . linaclotide (LINZESS) 290 MCG CAPS capsule Take 290 mcg by mouth daily before breakfast.  . metFORMIN (GLUCOPHAGE) 500 MG tablet TAKE 1 TABLET BY MOUTH TWICE A DAY WITH MEALS  . rosuvastatin  (CRESTOR) 5 MG tablet Take 1 tablet (5 mg total) by mouth daily.  Marland Kitchen SYNTHROID 100 MCG tablet TAKE 1 TABLET BY MOUTH ONCE A DAY  . topiramate (TOPAMAX) 100 MG tablet TAKE 1 TABLET BY MOUTH EVERY NIGHT AT BEDTIME   Past Medical History:  Diagnosis Date  . Anxiety   . Constipation   . GERD (gastroesophageal reflux disease)   . Glucose intolerance (impaired glucose tolerance)   . Herpes dermatitis   . History of motion sickness   . Hx of Hashimoto thyroiditis   . Hyperlipidemia   . Hypertension   . Hypothyroidism, secondary   . Impingement syndrome of right shoulder   . Insomnia   . Metabolic syndrome   . Migraine   . OA (osteoarthritis)    RIGHT SHOULDER AC JOINT  . Obese   . OCD (obsessive compulsive disorder)   . PONV (postoperative nausea and vomiting)    SEVERE after breast reduction  . TMJ (dislocation of temporomandibular joint)   . Vitamin D deficiency    Past Surgical History:  Procedure Laterality Date  . biopsy of lymph node  FEB 2010   BENIGN  . BREAST BIOPSY Bilateral   . BREAST EXCISIONAL BIOPSY Left    benign  . BREAST REDUCTION SURGERY Bilateral MAY 2010  . CARPAL TUNNEL RELEASE Bilateral 2012  . CHONDROPLASTY Left 10/25/2017   Procedure: CHONDROPLASTY;  Surgeon: Sydnee Cabal, MD;  Location: [facility];  Service: Orthopedics;  Laterality: Left;  . EXCISION MASS ABDOMINAL N/A 04/14/2017   Procedure: EXCISION OF SUBCUTANEOUS LIPOMA OF LEFT UPPER ABDOMINAL WALL;  Surgeon: Donnie Mesa, MD;  Location: [facility];  Service: General;  Laterality: N/A;  . KNEE ARTHROSCOPY WITH MEDIAL MENISECTOMY Left 10/25/2017   Procedure: LEFT KNEE ARTHROSCOPY WITH MEDIAL AND LATERAL MENISECTOMY;  Surgeon: Sydnee Cabal, MD;  Location: [facility];  Service: Orthopedics;  Laterality: Left;  . LASIK  2008  . REDUCTION MAMMAPLASTY Bilateral   . SHOULDER ARTHROSCOPY WITH ROTATOR CUFF REPAIR AND SUBACROMIAL DECOMPRESSION Right  09/19/2012   Procedure: RIGHT SHOULDER EXAMINE UNDER ANESTHESIA, ARTHROSCOPY,  DEBRIDEMENT, SUBACROMIAL DECOMPRESSION, DISTAL CLAVICLE RESECTION AND  ROTATOR CUFF REPAIR, LABRAL REPAIR ;  Surgeon: Sydnee Cabal, MD;  Location: [facility];  Service: Orthopedics;  Laterality: Right;  . WISDOM TOOTH EXTRACTION  AGE 62   Social History   Social History Narrative   Lived alone until her mother moved in (retired and moved from [place]).   Her sister and father also live locally.   family history includes Colon cancer in her maternal grandfather; Esophageal cancer in an other family member; Heart disease in her maternal grandfather and paternal grandmother.   Review of Systems Regular menses no dysmenorrhea.  All other review of systems appear negative other than that mentioned in the HPI.

## 2018-06-23 NOTE — Patient Instructions (Signed)
It was very nice to meet you today.  I am sorry you have been feeling so bad.  As we discussed, Crestor may be causing this.  It can cause abdominal pain with or without pancreatitis.  I want you to stop that.  You may stop the Linzess as we do not think constipation is the issue.   I would like Dr. Renold Genta to check amylase and lipase today.   If you fail to recover after stopping the Crestor then we will do an EGD.   I appreciate the opportunity to care for you. Gatha Mayer, MD, [facility]

## 2018-06-24 LAB — AMYLASE: Amylase: 62 U/L (ref 21–101)

## 2018-06-24 LAB — LIPASE: Lipase: 15 U/L (ref 7–60)

## 2018-06-26 ENCOUNTER — Other Ambulatory Visit: Payer: Self-pay

## 2018-06-26 DIAGNOSIS — R1013 Epigastric pain: Secondary | ICD-10-CM

## 2018-06-26 NOTE — Progress Notes (Signed)
We will call her and set up an EGD if she is still having epigastric pain

## 2018-06-27 MED FILL — LARIN FE 1.5-30 TABLET: 1.5-30 | 84 days supply | Qty: 84 | Fill #1

## 2018-06-29 ENCOUNTER — Telehealth: Payer: Self-pay | Admitting: Internal Medicine

## 2018-06-29 NOTE — Telephone Encounter (Signed)

## 2018-06-30 ENCOUNTER — Other Ambulatory Visit: Payer: Self-pay

## 2018-06-30 ENCOUNTER — Ambulatory Visit (AMBULATORY_SURGERY_CENTER): Payer: No Typology Code available for payment source | Admitting: Internal Medicine

## 2018-06-30 ENCOUNTER — Encounter: Payer: Self-pay | Admitting: Internal Medicine

## 2018-06-30 VITALS — BP 133/58 | HR 61 | Temp 98.4°F | Resp 28 | Ht 65.0 in | Wt 256.0 lb

## 2018-06-30 DIAGNOSIS — K317 Polyp of stomach and duodenum: Secondary | ICD-10-CM

## 2018-06-30 DIAGNOSIS — R1013 Epigastric pain: Secondary | ICD-10-CM

## 2018-06-30 MED ORDER — SODIUM CHLORIDE 0.9 % IV SOLN: 500.0000 mL | Freq: Once | INTRAVENOUS | Status: DC

## 2018-06-30 NOTE — Progress Notes (Signed)
Called to room to assist during endoscopic procedure.  Patient ID and intended procedure confirmed with present staff. Received instructions for my participation in the procedure from the performing physician.  

## 2018-06-30 NOTE — Patient Instructions (Addendum)
   Nothing bad seen here.  You do have gastric polyps but they look like a type called fundic gland polyps which are totally innocent and NOT pre-cancerous. If so no follow-up needed for these.  I will have you try some samples of FD gard.  Await biopsy results and will call.  Still stay off Crestor for now.  I appreciate the opportunity to care for you. Gatha Mayer, MD, [facility]    June 5 visit--Trial of Linzess samples provided  and continue Nexium

## 2018-06-30 NOTE — Progress Notes (Signed)
Report given to PACU, vss 

## 2018-06-30 NOTE — Op Note (Addendum)
[facility] Patient Name: Dana Washington Procedure Date: 06/30/2018 3:47 PM MRN: [id number] Endoscopist: Gatha Mayer , MD Age: 43 Referring MD:  Date of Birth: 07/14/1975 Gender: Female Account #: [id number] Procedure:                Upper GI endoscopy Indications:              Epigastric abdominal pain Medicines:                Propofol per Anesthesia, Monitored Anesthesia Care Procedure:                Pre-Anesthesia Assessment:                           - Prior to the procedure, a History and Physical                            was performed, and patient medications and                            allergies were reviewed. The patient's tolerance of                            previous anesthesia was also reviewed. The risks                            and benefits of the procedure and the sedation                            options and risks were discussed with the patient.                            All questions were answered, and informed consent                            was obtained. Prior Anticoagulants: The patient has                            taken no previous anticoagulant or antiplatelet                            agents. ASA Grade Assessment: III - A patient with                            severe systemic disease. After reviewing the risks                            and benefits, the patient was deemed in                            satisfactory condition to undergo the procedure.                           After obtaining informed consent, the endoscope was  passed under direct vision. Throughout the                            procedure, the patient's blood pressure, pulse, and                            oxygen saturations were monitored continuously. The                            Endoscope was introduced through the mouth, and                            advanced to the second part of duodenum. The upper   GI endoscopy was accomplished without difficulty.                            The patient tolerated the procedure well. Scope In: Scope Out: Findings:                 Multiple small semi-sessile polyps with no stigmata                            of recent bleeding were found in the gastric fundus                            and in the gastric body. Biopsies were taken with a                            cold forceps for histology. Verification of patient                            identification for the specimen was done. Estimated                            blood loss was minimal.                           The exam was otherwise without abnormality.                           The cardia and gastric fundus were normal on                            retroflexion. Complications:            No immediate complications. Estimated Blood Loss:     Estimated blood loss was minimal. Impression:               - Multiple gastric polyps. Biopsied. These look                            like fundic gland polyps which are of no clinical                            consequence and not a cause of pain                           -  The examination was otherwise normal. Recommendation:           - Patient has a contact number available for                            emergencies. The signs and symptoms of potential                            delayed complications were discussed with the                            patient. Return to normal activities tomorrow.                            Written discharge instructions were provided to the                            patient.                           - Resume previous diet.                           - Continue present medications. stay off Crestor                            still. Try [name] Consider chnage PPI - has been                            on Nexium x 8 yrs - ? tachyphylaxis                           - Await pathology results. Gatha Mayer, MD 06/30/2018  4:21:28 PM This report has been signed electronically.

## 2018-06-30 NOTE — Patient Instructions (Addendum)
I found some stomach polyps that look like innocent fundic gland polyps that are not causing problems and do not lead to problems.  I took biopsies.  Try FD Gard samples and let me know if that helps.  Stay off the Crestor still.  We may need to swithch from Nexium.  I appreciate the opportunity to care for you. Gatha Mayer, MD, FACG  YOU HAD AN ENDOSCOPIC PROCEDURE TODAY AT [facility]:   Refer to the procedure report that was given to you for any specific questions about what was found during the examination.  If the procedure report does not answer your questions, please call your gastroenterologist to clarify.  If you requested that your care partner not be given the details of your procedure findings, then the procedure report has been included in a sealed envelope for you to review at your convenience later.  YOU SHOULD EXPECT: Some feelings of bloating in the abdomen. Passage of more gas than usual.  Walking can help get rid of the air that was put into your GI tract during the procedure and reduce the bloating. If you had a lower endoscopy (such as a colonoscopy or flexible sigmoidoscopy) you may notice spotting of blood in your stool or on the toilet paper. If you underwent a bowel prep for your procedure, you may not have a normal bowel movement for a few days.  Please Note:  You might notice some irritation and congestion in your nose or some drainage.  This is from the oxygen used during your procedure.  There is no need for concern and it should clear up in a day or so.  SYMPTOMS TO REPORT IMMEDIATELY:    Following upper endoscopy (EGD)  Vomiting of blood or coffee ground material  New chest pain or pain under the shoulder blades  Painful or persistently difficult swallowing  New shortness of breath  Fever of 100F or higher  Black, tarry-looking stools  For urgent or emergent issues, a gastroenterologist can be reached at any hour by calling  [phone].   DIET:  We do recommend a small meal at first, but then you may proceed to your regular diet.  Drink plenty of fluids but you should avoid alcoholic beverages for 24 hours.  ACTIVITY:  You should plan to take it easy for the rest of today and you should NOT DRIVE or use heavy machinery until tomorrow (because of the sedation medicines used during the test).    FOLLOW UP: Our staff will call the number listed on your records 48-72 hours following your procedure to check on you and address any questions or concerns that you may have regarding the information given to you following your procedure. If we do not reach you, we will leave a message.  We will attempt to reach you two times.  During this call, we will ask if you have developed any symptoms of COVID 19. If you develop any symptoms (ie: fever, flu-like symptoms, shortness of breath, cough etc.) before then, please call [phone].  If you test positive for Covid 19 in the 2 weeks post procedure, please call and report this information to [place].    If any biopsies were taken you will be contacted by phone or by letter within the next 1-3 weeks.  Please call us at [phone] if you have not heard about the biopsies in 3 weeks.    SIGNATURES/CONFIDENTIALITY: You and/or your care partner have signed paperwork which  will be entered into your electronic medical record.  These signatures attest to the fact that that the information above on your After Visit Summary has been reviewed and is understood.  Full responsibility of the confidentiality of this discharge information lies with you and/or your care-partner.

## 2018-07-04 ENCOUNTER — Telehealth: Payer: Self-pay | Admitting: *Deleted

## 2018-07-04 NOTE — Telephone Encounter (Signed)
  Follow up Call-  Call back number 06/30/2018  Post procedure Call Back phone  # [phone]  Permission to leave phone message Yes  Some recent data might be hidden     Patient questions:  Do you have a fever, pain , or abdominal swelling? No. Pain Score  0 *  Have you tolerated food without any problems? Yes.    Have you been able to return to your normal activities? Yes.    Do you have any questions about your discharge instructions: Diet   No. Medications  No. Follow up visit  No.  Do you have questions or concerns about your Care? No.  Actions: * If pain score is 4 or above: No action needed, pain <4    1. Have you developed a fever since your procedure? no  2.   Have you had an respiratory symptoms (SOB or cough) since your procedure? no  3.   Have you tested positive for COVID 19 since your procedure no  4.   Have you had any family members/close contacts diagnosed with the COVID 19 since your procedure?  no   If yes to any of these questions please route to Joylene John, RN and Alphonsa Gin, Therapist, sports.

## 2018-07-06 ENCOUNTER — Encounter: Payer: Self-pay | Admitting: Internal Medicine

## 2018-07-06 NOTE — Progress Notes (Signed)
Fundic gland polyp[s My chart letter  No recall

## 2018-07-07 MED FILL — TOPIRAMATE 100 MG TABLET: 100 | 90 days supply | Qty: 90 | Fill #2

## 2018-07-08 ENCOUNTER — Encounter: Payer: Self-pay | Admitting: Internal Medicine

## 2018-07-08 NOTE — Progress Notes (Signed)
   Subjective:    Patient ID: Dana Washington, female    DOB: 1975/11/22, 43 y.o.   MRN: [id number]  HPI 43 year old Female complaining of epigastric pain and dark stools.  Recently was taking Lipitor with Biaxin and developed myalgias.  Subsequently stopped the Lipitor but says myalgias have persisted.  Now has epigastric pain.    Patient seen at urgent care via video visit June 3 complaint increased burping and upper abdominal pressure.  Denied bright red blood per rectum.  Has been taking MiraLAX and Benefiber along with stool softeners.  Was told to take magnesium citrate on June 3.  Says she has been constipated all of her life and that that is nothing new but feels that the epigastric discomfort is something needed.  Says that a distant cousin who was in her late 58s was diagnosed with gastric cancer in 05-10-2022 and passed away.  She has been worried about this, I think.  In 2019 she had a CT scan of her abdomen and pelvis because of left upper quadrant pain with some subcutaneous nodules noted on exam but CT only showed moderate fecal retention within the left colon.  Subsequently had removal of the lipoma.  Review of Systems see above     Objective:   Physical Exam Abdomen is soft nondistended without hepatosplenomegaly or masses.  She has some mild epigastric tenderness without rebound.  Stool is guaiac negative.      Assessment & Plan:  History of constipation-trial of Linzess  Epigastric pain-continue Nexium  Myalgias on Crestor  Plan: Samples of Linzess 290 mg daily for constipation.  I feel that constipation is her main issue.  She can continue with Nexium.  If not improving refer to GI.

## 2018-07-09 NOTE — Progress Notes (Signed)
Subjective:    Patient ID: Dana Washington, female    DOB: [birth year], 43 y.o.   MRN: [id number]  HPI 43 year old Female in today for health maintenance exam and evaluation of medical issues.  Recently has had problems with epigastric pain and frequent burping.  She has a history of functional constipation.  She is being evaluated by Dr. Carlean Purl.  She may need to have endoscopy.  She has been on statin medication but developed myalgias while taking that with Biaxin.  Ordinarily pharmacy will tell patient to stop statin while taking Biaxin.  Not will be on statin medication at the present time.  She has tried PPI without relief of epigastric pain.  However the cousin recently passed away from gastric cancer in April.  She was in her 15s.  This frightened the patient.  Denies any stress at work.  Says things are going well.  Not anxious or depressed.  She has a history of depression treated with Cymbalta.  She has essential hypertension, hyperlipidemia, obesity, metabolic syndrome, impaired glucose tolerance, migraine headaches and hypothyroidism.  Dr. Georgette Dover removed a lipoma from her left upper abdominal wall in April 2019.  Social history: Single never married.  Lives with her mother.  Sister lives here.  Patient works as a Development worker, community.  Non-smoker.  Social alcohol consumption.  Enjoys dogs.  Family history: Father with history of prostate cancer and hyperlipidemia deceased.  Brother with history of skin cancer and hyperlipidemia.  Sister with history of migraine headaches.  Says there is a family history of early onset breast cancer.  Patient had breast reduction surgery May 2010.  He has bilateral carpal tunnel release February 2012.  LASIK surgery 2008 for myopia.  History of Hashimoto's thyroiditis with resultant hypothyroidism.  History of vitamin D deficiency.  Benign lymph node biopsy February 2010.  History of seroma left breast which was a result of her breast reduction surgery  in 2010.  Shoulder surgery by Dr. Theda Sers September 2014.  She had a 20% rotator cuff tear and osteoarthritis of the AC joint.  Had partial clavicle resection.  She had a glenoid labral tear.  History of urticaria February 2018 treated with steroids Zyrtec and Zantac.  Symptoms resolved.  Was able to successfully lose weight with weight watchers.  She is trying that once again.      Review of Systems  Constitutional: Negative.   HENT: Negative.   Respiratory: Negative.   Gastrointestinal:       Epigastric pain and functional constipation  Musculoskeletal:       Arthralgias and myalgias  Neurological:       History of migraine headaches  Psychiatric/Behavioral:       History of anxiety and depression       Objective:   Physical Exam Vital signs reviewed.  Skin warm and dry.  Nodes none.  HEENT exam TMs and pharynx are clear.  Neck is supple.  No thyromegaly.  Chest clear.  Cardiac exam regular rate and rhythm normal S1 and S2.  Breast exam and pelvic exam deferred to GYN physician.  Cardiac exam is normal.  Abdomen soft nondistended without hepatosplenomegaly or masses.  Mild epigastric tenderness without rebound.  Extremities without edema.  Neuro no focal deficits.        Assessment & Plan:    ?  Epigastric pain-?  Gastritis-likely will need endoscopy to resolve this issue.  Has been on PPI without success  Arthralgias and myalgias- not sure if this is statin  related but currently off statin medication.  GE reflux-takes Nexium  Anxiety depression has tried Prozac and Cymbalta  Functional constipation-we briefly tried Linzess.  Dr. Carlean Purl think sshe could manage this on her own without medication  Hypothyroidism-stable on thyroid replacement therapy  Hyperlipidemia-total cholesterol was 208 with an LDL cholesterol of 123.  Currently off statin therapy due to myalgias.  Plan: She will be following up with Dr. Carlean Purl for epigastric pain.  Pancreatitis has been ruled  out.

## 2018-07-09 NOTE — Patient Instructions (Signed)
It was a pleasure to see you today.  Stay off statin medication for now. Follow up with Dr. Carlean Purl. watch diet and exercise.

## 2018-07-18 ENCOUNTER — Other Ambulatory Visit: Payer: Self-pay | Admitting: Internal Medicine

## 2018-07-18 MED FILL — FLUoxetine HCL 20 MG CAPS: 20 | 90 days supply | Qty: 90 | Fill #0

## 2018-07-19 IMAGING — MG 2D DIGITAL SCREENING BILATERAL MAMMOGRAM WITH CAD AND ADJUNCT TO
8 of 12 series · 8 of 28 positions shown · non-contrast
Comparison: Previous exam(s).

CLINICAL DATA: Screening.

EXAM:
2D DIGITAL SCREENING BILATERAL MAMMOGRAM WITH CAD AND ADJUNCT TOMO

[R CC]
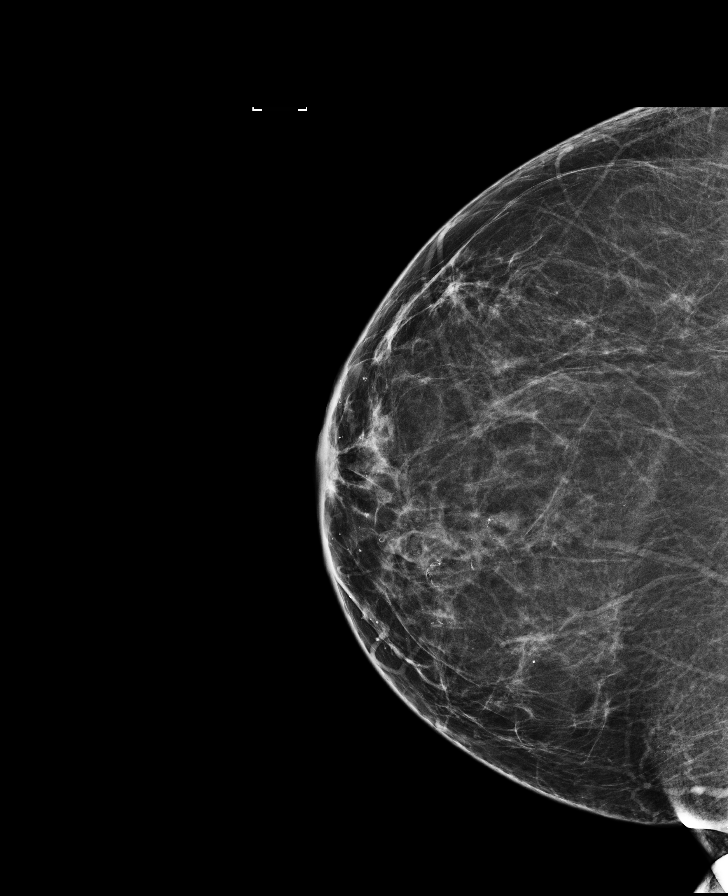

[L CC synth-2D]
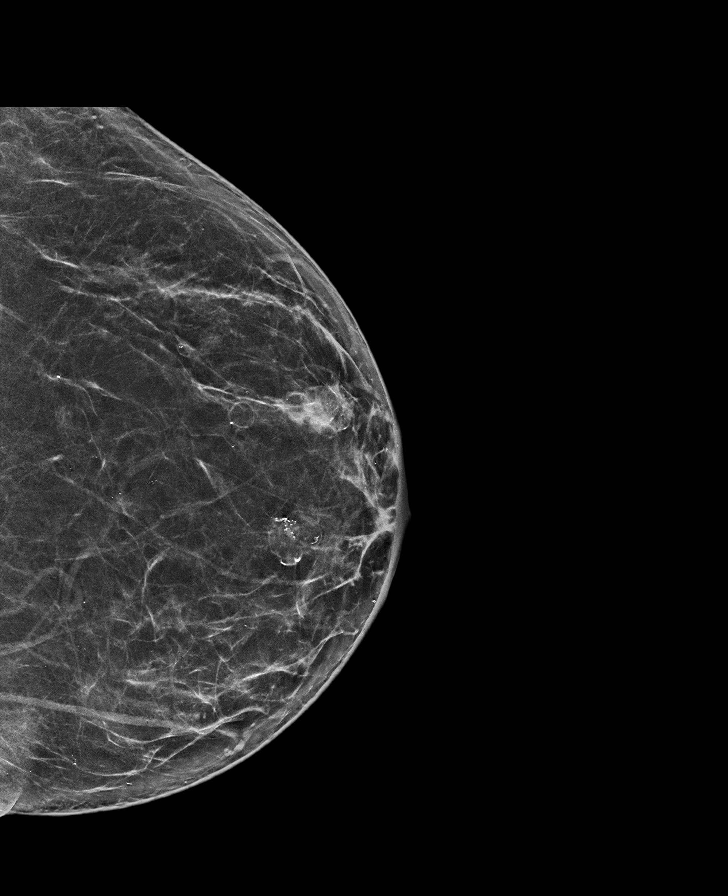

[R MLO synth-2D]
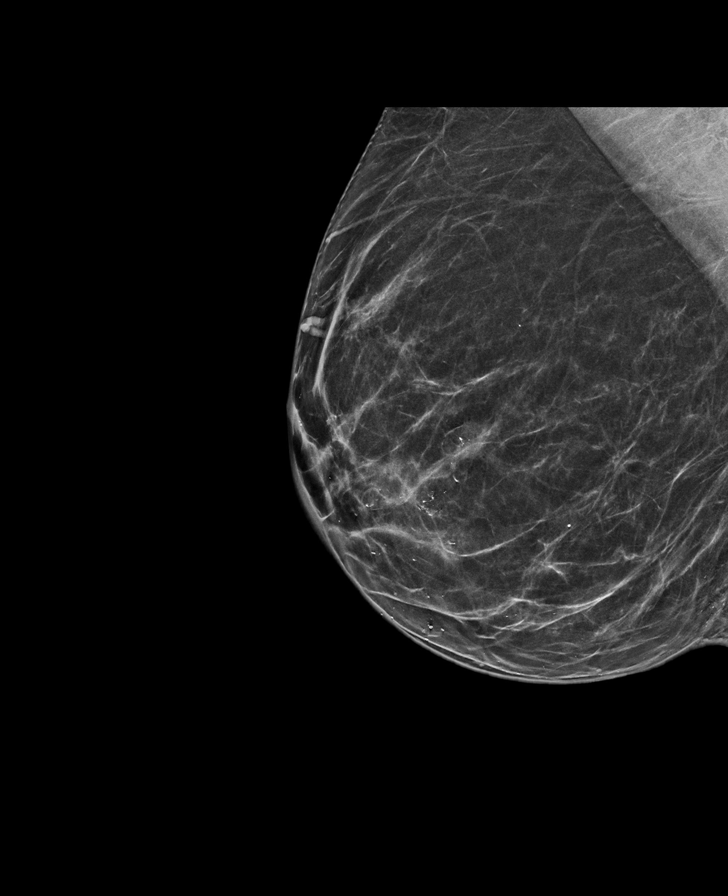

[R MLO]
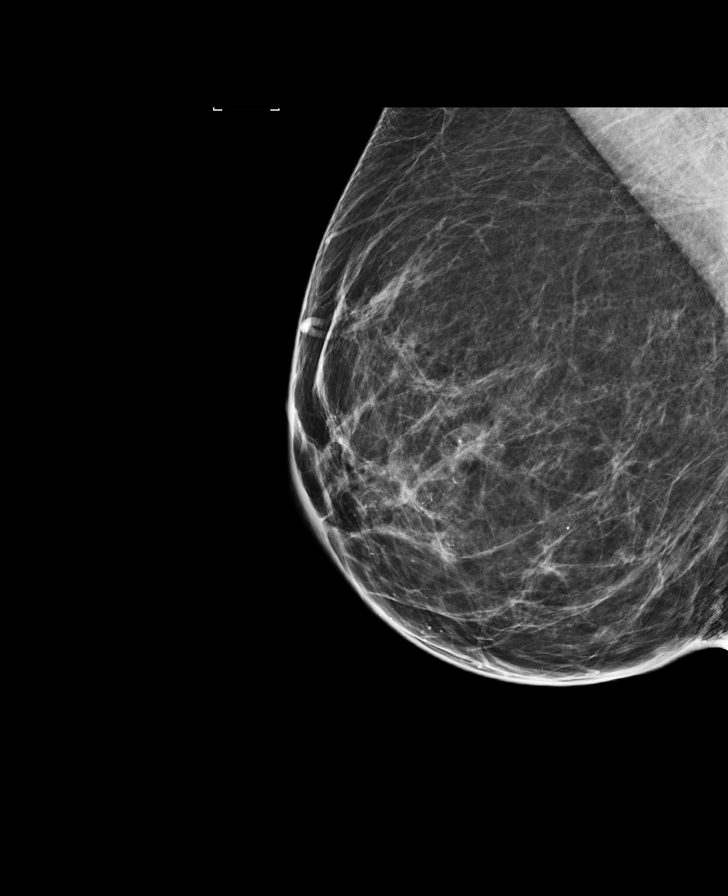

[L MLO synth-2D]
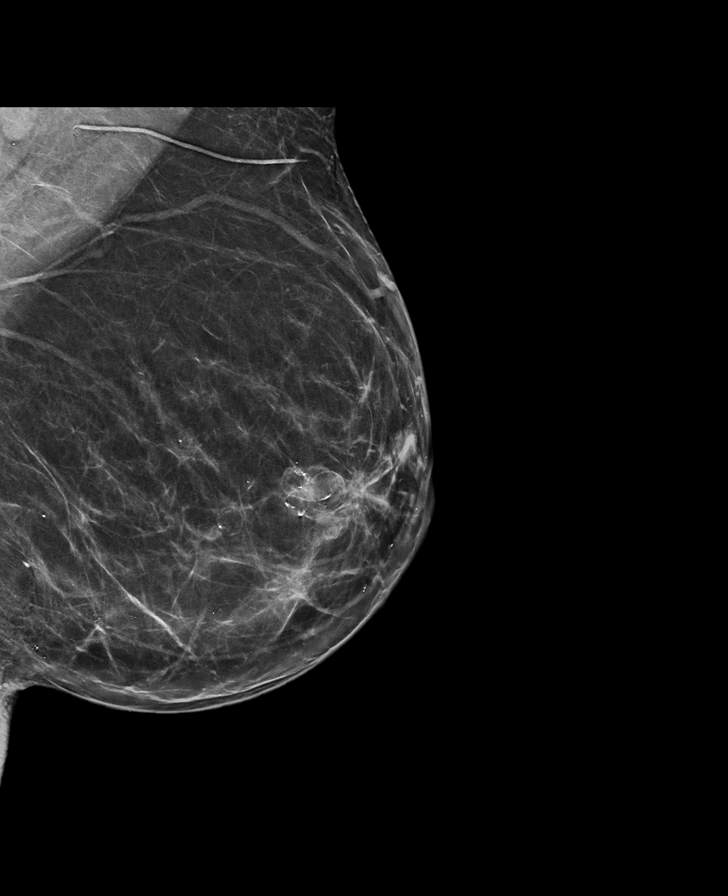

[R CC synth-2D]
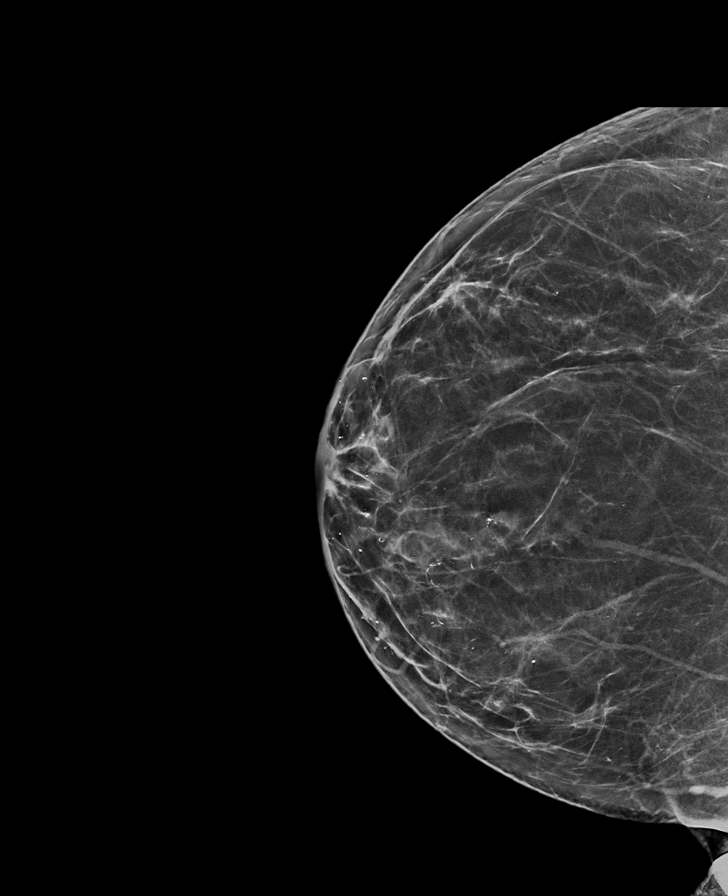

[L CC]
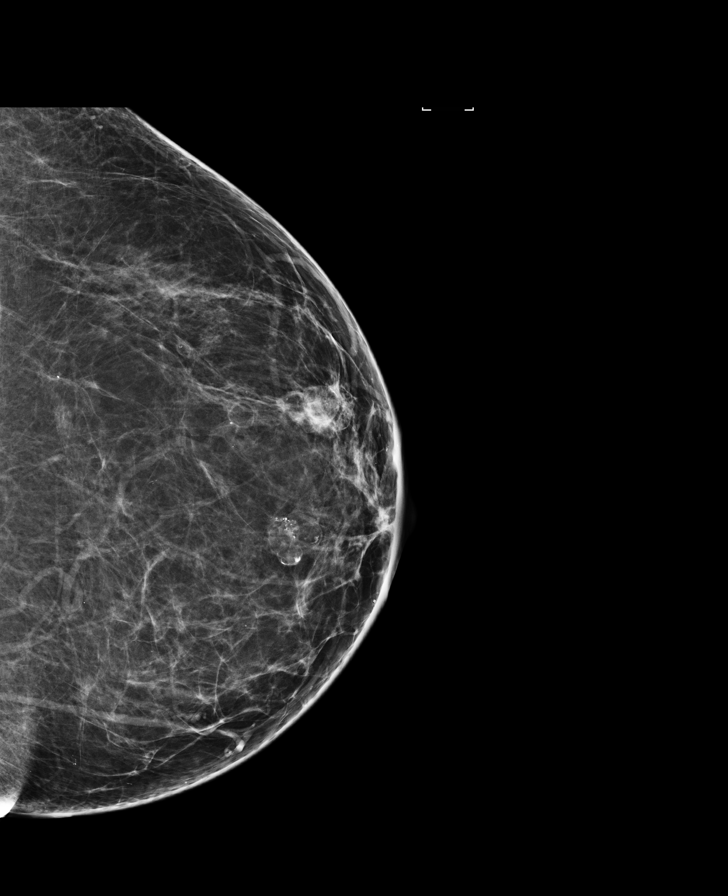

[L MLO]
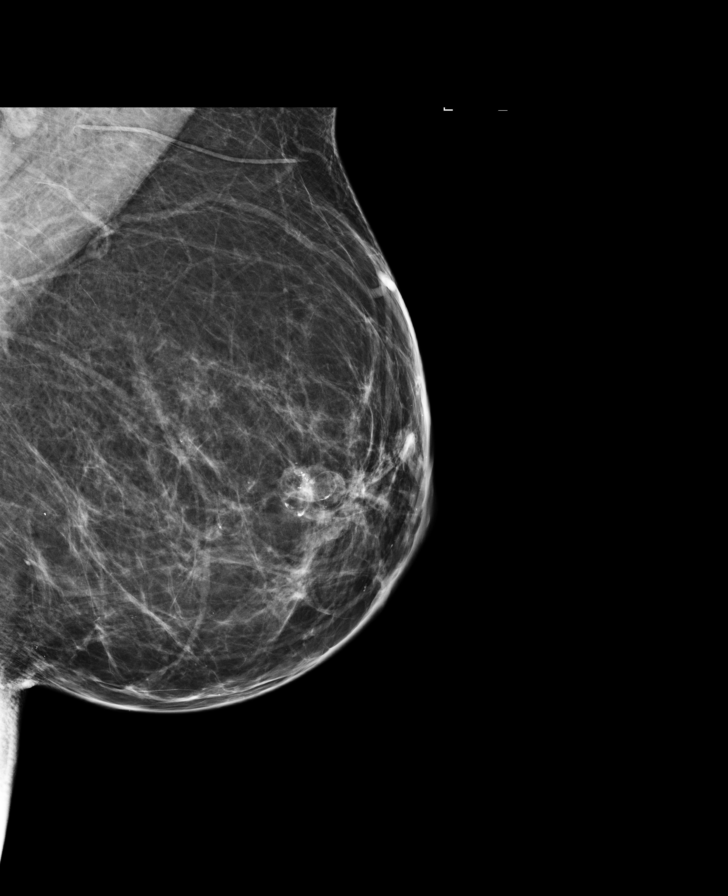

[8 of 28 positions shown; findings below may reference images not displayed]

ACR Breast Density Category b: There are scattered areas of
fibroglandular density.
FINDINGS: There are no findings suspicious for malignancy. Images were
processed with CAD.
IMPRESSION: No mammographic evidence of malignancy. A result letter of this
screening mammogram will be mailed directly to the patient.

RECOMMENDATION:
Screening mammogram in one year. (Code:97-6-RS4)

BI-RADS CATEGORY  1: Negative.

## 2018-09-04 ENCOUNTER — Other Ambulatory Visit: Payer: Self-pay | Admitting: Internal Medicine

## 2018-09-04 MED FILL — SYNTHROID 100 MCG TABLET: 100 | 90 days supply | Qty: 90 | Fill #0

## 2018-09-04 MED FILL — ESOMEPRAZOLE MAG DR 40 MG C: 40 | 90 days supply | Qty: 90 | Fill #1

## 2018-09-04 MED FILL — BENAZEPRIL-HYDROCHLOROTHIAZ: 20-12.5 | 90 days supply | Qty: 90 | Fill #0

## 2018-09-19 MED FILL — LARIN FE 1.5-30 TABLET: 1.5-30 | 84 days supply | Qty: 84 | Fill #2

## 2018-09-21 ENCOUNTER — Other Ambulatory Visit: Payer: Self-pay | Admitting: Internal Medicine

## 2018-09-21 DIAGNOSIS — Z1231 Encounter for screening mammogram for malignant neoplasm of breast: Secondary | ICD-10-CM

## 2018-09-26 ENCOUNTER — Ambulatory Visit (INDEPENDENT_AMBULATORY_CARE_PROVIDER_SITE_OTHER): Payer: No Typology Code available for payment source | Admitting: Internal Medicine

## 2018-09-26 ENCOUNTER — Encounter: Payer: Self-pay | Admitting: Internal Medicine

## 2018-09-26 ENCOUNTER — Telehealth: Payer: Self-pay | Admitting: Internal Medicine

## 2018-09-26 ENCOUNTER — Other Ambulatory Visit: Payer: Self-pay

## 2018-09-26 VITALS — BP 110/80 | HR 115 | Temp 97.9°F | Ht 65.0 in | Wt 254.0 lb

## 2018-09-26 DIAGNOSIS — R1032 Left lower quadrant pain: Secondary | ICD-10-CM

## 2018-09-26 DIAGNOSIS — I878 Other specified disorders of veins: Secondary | ICD-10-CM | POA: Diagnosis not present

## 2018-09-26 DIAGNOSIS — K5909 Other constipation: Secondary | ICD-10-CM

## 2018-09-26 DIAGNOSIS — R1030 Lower abdominal pain, unspecified: Secondary | ICD-10-CM | POA: Diagnosis not present

## 2018-09-26 NOTE — Progress Notes (Signed)
   Subjective:    Patient ID: Dana Washington, female    DOB: 1975-01-27, 43 y.o.   MRN: [id number]  HPI  43 year old Female with anxiety who is worried about swelling and redness in right ankle.  No known ankle injury.  Has had some numbness in a  vertical narrow half inch distribution about 5 inches long anterolateral foot.  The ankle itself is not tender. She is overweight. Works as a Optician, dispensing at [employer].    Also having some left groin or abdominal pain but when asked to point to the pain seems to be LLQ. Hx of constipation longstanding.Tried Linzess without much success so Dr. Carlean Purl told her to stop it.  Had been placed on Crestor for hyperlipidemia but that caused abdominal pain.Did not have elevated lipase or amylase so doubt she had pancreatitis as she thought.     Pt had endoscopy by Dr. Carlean Purl June 2020 showing multiple gastric polyps. Pathology showed these to be fundic gland polyps. Was treated with Nexium and improved. Still taking Nexium. Has reflux symptoms about twice a week. May want to consider lower dose of Nexium . Review of Systems see above     Objective:   Physical Exam VS reviewed.  Mild redness in about a 3 inch distribution left ankle.  Mild nonpitting edema in same area.  No pain with dorsiflexion or flexion of ankle.  Tender in left lower quadrant.  Abdomen is obese nondistended without hepatosplenomegaly.  Rebound tenderness is not present today.       Assessment & Plan:  Suspect venous stasis left lower extremity which would respond to elevation.  She has been taking some ibuprofen for what she thought was pain in her left groin but is actually left lower quadrant abdominal pain.  Ibuprofen may be causing some mild edema.  Suggest an ankle wrap which I think will help.  She will also have venous Doppler and let me know the results to rule out DVT.  I do not think she needs a diuretic.  Regarding the numbness I think it is mild and currently  inconsequential.  Left lower quadrant abdominal pain.  She had CT of the abdomen when she had 2 lipomas removed in brain 2019.  Had fecal retention in the left colon.  I suspect this is the etiology of her left lower quadrant abdominal pain this time.  With asked her to increase MiraLAX to double the dose and see if it helps relieve constipation.  She is going to have CT of the abdomen with and without contrast because of left lower quadrant abdominal pain.  She has a lot of anxiety about her health and is always worried about cancer.  GE reflux-consider lowering dose of Nexium to 20 mg daily to see if reflux can be controlled at that lower dose.  30 minutes spent including time with patient and including reviewing records as well as medical decision making

## 2018-09-26 NOTE — Patient Instructions (Addendum)
Please have sonographer check for DVT.  Increase MiraLAX to double your current dose for functional constipation.  Try elastic ankle bandage for venous stasis.  Have CT of the left lower quadrant due to persistent left lower quadrant pain.

## 2018-09-26 NOTE — Telephone Encounter (Signed)
OK to schedule visit.

## 2018-09-26 NOTE — Telephone Encounter (Signed)
Dana Washington [phone]  Lorrin called to say that a couple of days ago her lower leg around ankle started swelling, pain and redness. She would like to come in for you to look at it.

## 2018-09-26 NOTE — Telephone Encounter (Signed)
Appointment scheduled.

## 2018-09-27 ENCOUNTER — Other Ambulatory Visit: Payer: Self-pay | Admitting: Internal Medicine

## 2018-09-27 DIAGNOSIS — R1032 Left lower quadrant pain: Secondary | ICD-10-CM

## 2018-09-27 DIAGNOSIS — R109 Unspecified abdominal pain: Secondary | ICD-10-CM

## 2018-09-28 ENCOUNTER — Other Ambulatory Visit: Payer: No Typology Code available for payment source | Admitting: Internal Medicine

## 2018-09-28 ENCOUNTER — Ambulatory Visit (HOSPITAL_COMMUNITY)
Admission: RE | Admit: 2018-09-28 | Discharge: 2018-09-28 | Disposition: A | Discharge status: Home / Self Care | Payer: No Typology Code available for payment source | Source: Ambulatory Visit | Attending: Internal Medicine | Admitting: Internal Medicine

## 2018-09-28 ENCOUNTER — Other Ambulatory Visit: Payer: Self-pay

## 2018-09-28 DIAGNOSIS — R109 Unspecified abdominal pain: Secondary | ICD-10-CM | POA: Diagnosis not present

## 2018-09-28 DIAGNOSIS — R1032 Left lower quadrant pain: Secondary | ICD-10-CM

## 2018-09-28 LAB — BASIC METABOLIC PANEL
BUN: 13 mg/dL (ref 7–25)
CO2: 26 mmol/L (ref 20–32)
Calcium: 9.2 mg/dL (ref 8.6–10.2)
Chloride: 101 mmol/L (ref 98–110)
Creat: 0.85 mg/dL (ref 0.50–1.10)
Glucose, Bld: 86 mg/dL (ref 65–99)
Potassium: 4.1 mmol/L (ref 3.5–5.3)
Sodium: 137 mmol/L (ref 135–146)

## 2018-09-28 MED ORDER — IOHEXOL 300 MG/ML  SOLN
100.0000 mL | Freq: Once | INTRAMUSCULAR | Status: AC | PRN
  Administered 2018-09-28: 100 mL via INTRAVENOUS

## 2018-09-29 ENCOUNTER — Other Ambulatory Visit: Payer: Self-pay | Admitting: Internal Medicine

## 2018-09-29 DIAGNOSIS — N2889 Other specified disorders of kidney and ureter: Secondary | ICD-10-CM

## 2018-09-29 NOTE — Addendum Note (Signed)
Addended by: Mady Haagensen on: 09/29/2018 04:46 PM   Modules accepted: Orders

## 2018-10-02 ENCOUNTER — Other Ambulatory Visit: Payer: Self-pay

## 2018-10-02 ENCOUNTER — Encounter (HOSPITAL_COMMUNITY): Payer: Self-pay

## 2018-10-02 ENCOUNTER — Ambulatory Visit (HOSPITAL_COMMUNITY): Admission: RE | Admit: 2018-10-02 | Payer: No Typology Code available for payment source | Source: Ambulatory Visit

## 2018-10-02 ENCOUNTER — Ambulatory Visit (HOSPITAL_COMMUNITY)
Admission: RE | Admit: 2018-10-02 | Discharge: 2018-10-02 | Disposition: A | Discharge status: Home / Self Care | Payer: No Typology Code available for payment source | Source: Ambulatory Visit | Attending: Internal Medicine | Admitting: Internal Medicine

## 2018-10-02 DIAGNOSIS — N2889 Other specified disorders of kidney and ureter: Secondary | ICD-10-CM | POA: Diagnosis not present

## 2018-10-02 MED ORDER — GADOBUTROL 1 MMOL/ML IV SOLN
10.0000 mL | Freq: Once | INTRAVENOUS | Status: AC | PRN
  Administered 2018-10-02: 10 mL via INTRAVENOUS

## 2018-10-09 MED FILL — TOPIRAMATE 100 MG TABLET: 100 | 90 days supply | Qty: 90 | Fill #3

## 2018-10-22 MED FILL — FLUoxetine HCL 20 MG CAPS: 20 | 90 days supply | Qty: 90 | Fill #1

## 2018-11-06 ENCOUNTER — Ambulatory Visit
Admission: RE | Admit: 2018-11-06 | Discharge: 2018-11-06 | Disposition: A | Discharge status: Home / Self Care | Payer: No Typology Code available for payment source | Source: Ambulatory Visit | Attending: Internal Medicine | Admitting: Internal Medicine

## 2018-11-06 ENCOUNTER — Other Ambulatory Visit: Payer: Self-pay

## 2018-11-06 DIAGNOSIS — Z1231 Encounter for screening mammogram for malignant neoplasm of breast: Secondary | ICD-10-CM

## 2018-11-20 MED FILL — metFORMIN HCL 500 MG TABS: 500 | 90 days supply | Qty: 180 | Fill #2

## 2018-11-29 IMAGING — CT CT ABD-PELV W/ CM
2 of 5 series · 16 of 46 positions shown, 18 images · IV contrast (iopamidol)
Comparison: None.

CLINICAL DATA: Left upper quadrant pain x3 weeks. Two lumps
reported beneath the skin in the left upper quadrant.

EXAM:
CT ABDOMEN AND PELVIS WITH CONTRAST
TECHNIQUE: Multidetector CT imaging of the abdomen and pelvis was performed
using the standard protocol following bolus administration of
intravenous contrast.
CONTRAST:  125mL [id number] IOPAMIDOL ([id number]) INJECTION 61%

[Series 2: abd pelvis 5.00 br40 s3 ax · axial · 0.82mm/px · z∈[+1066,+1531]mm · 13 of 103 slices shown, 15 images]
[im 5/103  soft-tissue]
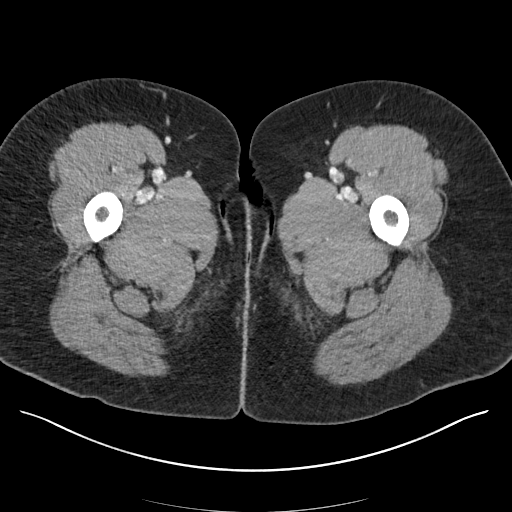
[im 5/103  bone]
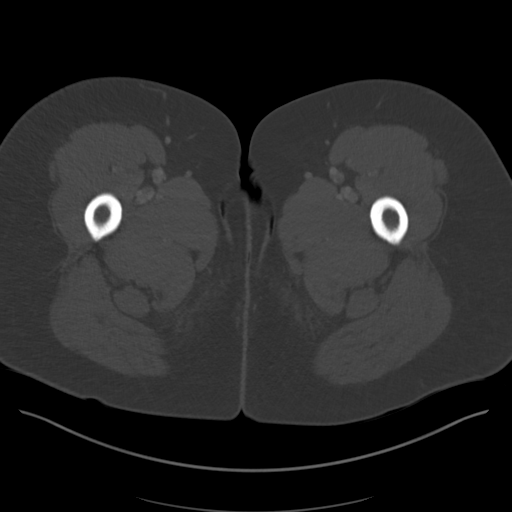
[im 15/103  soft-tissue]
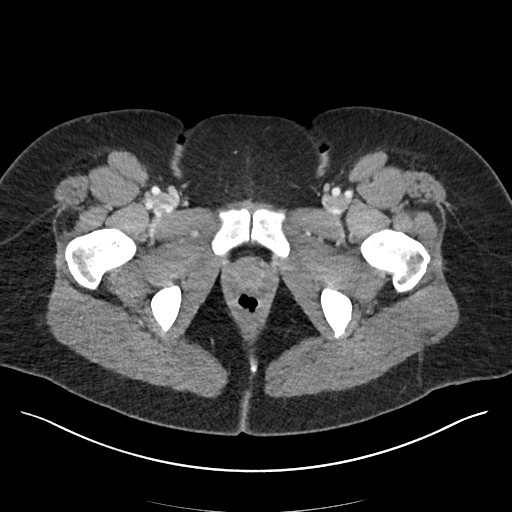
[im 20/103  soft-tissue]
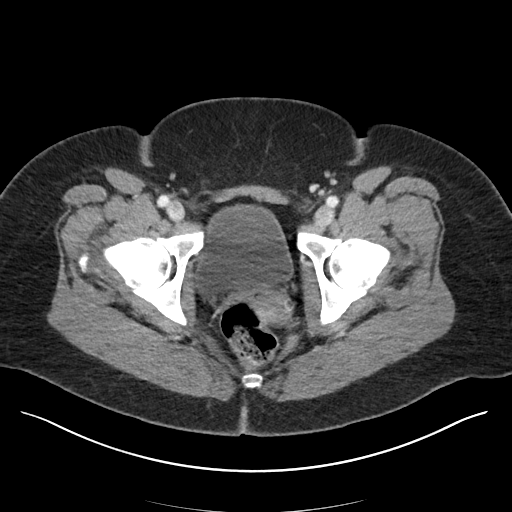
[im 30/103  soft-tissue]
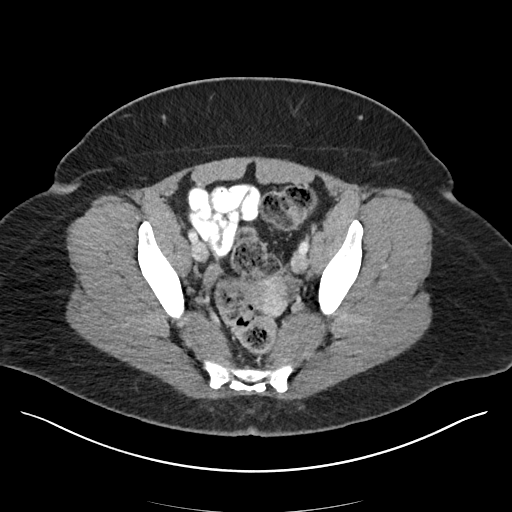
[im 35/103  soft-tissue]
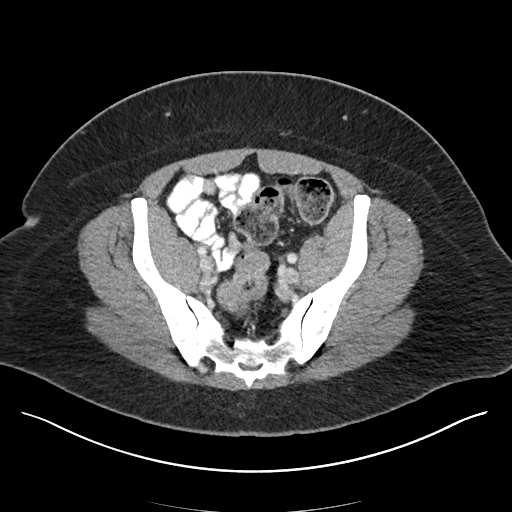
[im 44/103  soft-tissue]
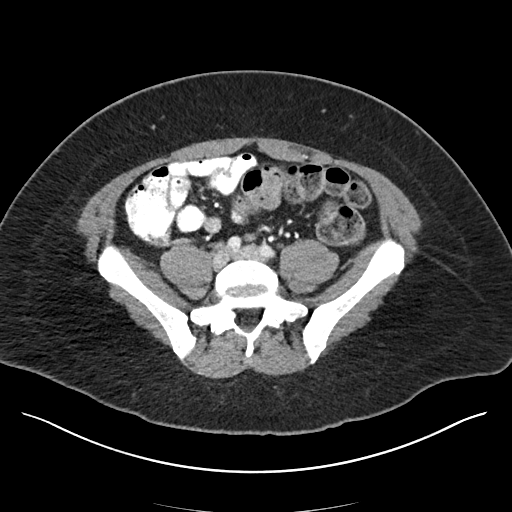
[im 54/103  soft-tissue]
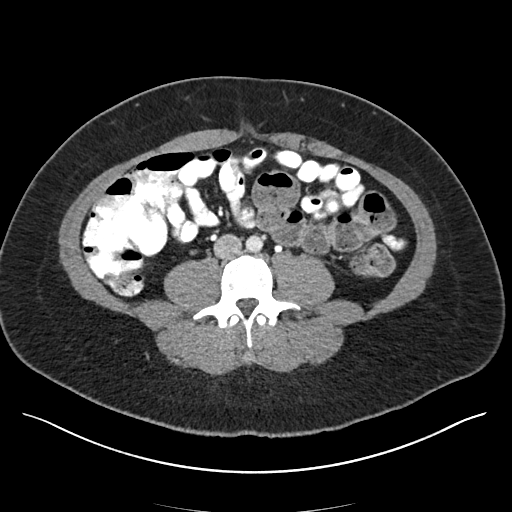
[im 59/103  soft-tissue]
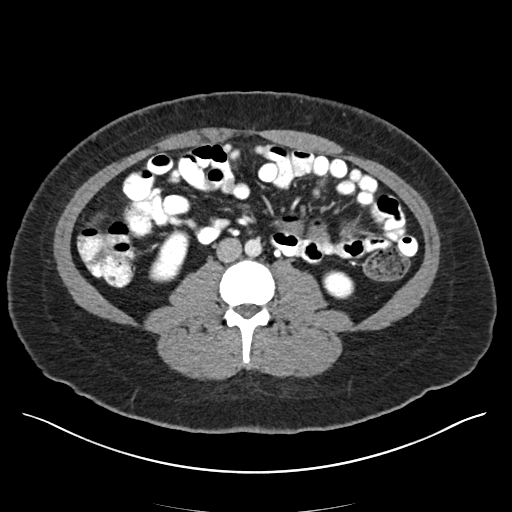
[im 69/103  soft-tissue]
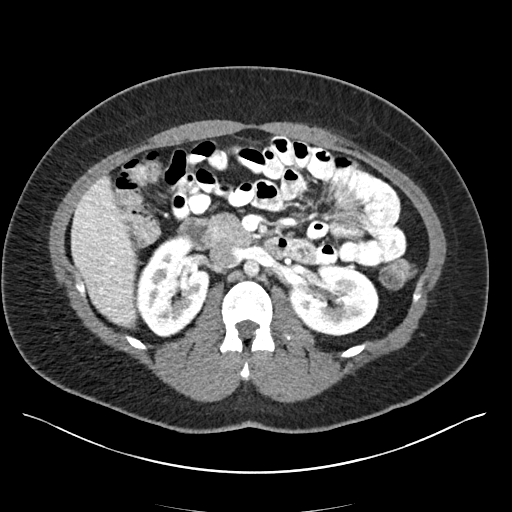
[im 69/103  bone]
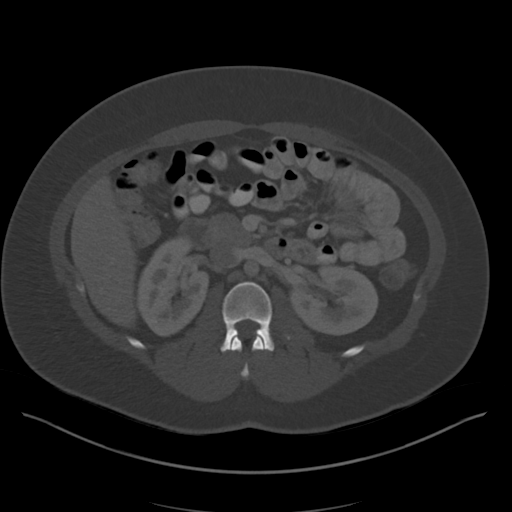
[im 73/103  soft-tissue]
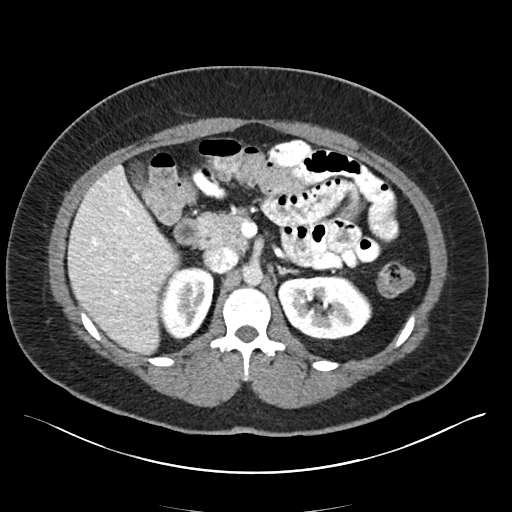
[im 83/103  soft-tissue]
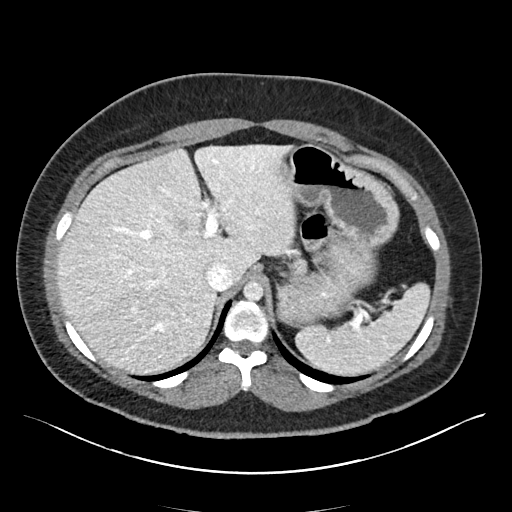
[im 88/103  soft-tissue]
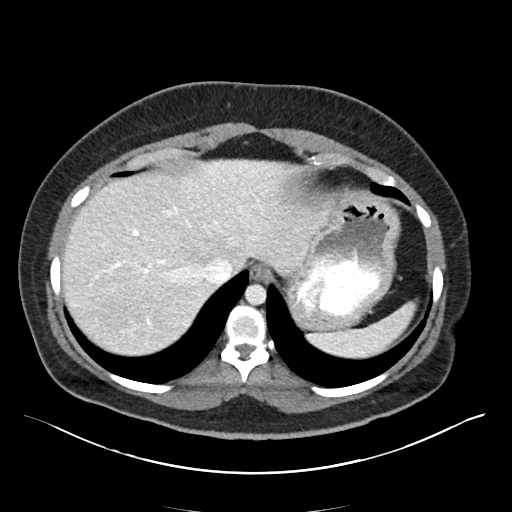
[im 98/103  soft-tissue]
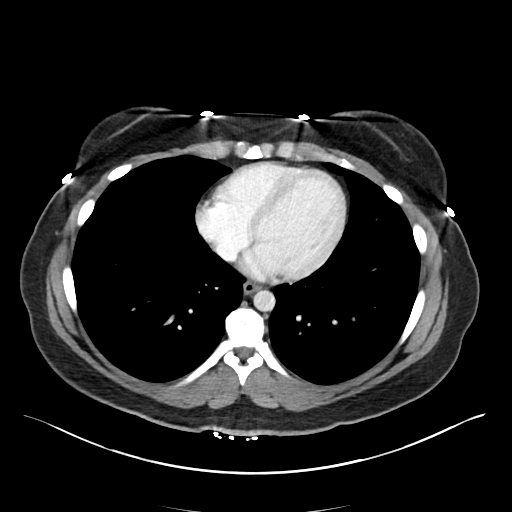

[Series 6: abd pelvis 2.00 br40 s3 cor · coronal · 0.82mm/px · 3 of 210 slices shown]
[im 70/210  soft-tissue]
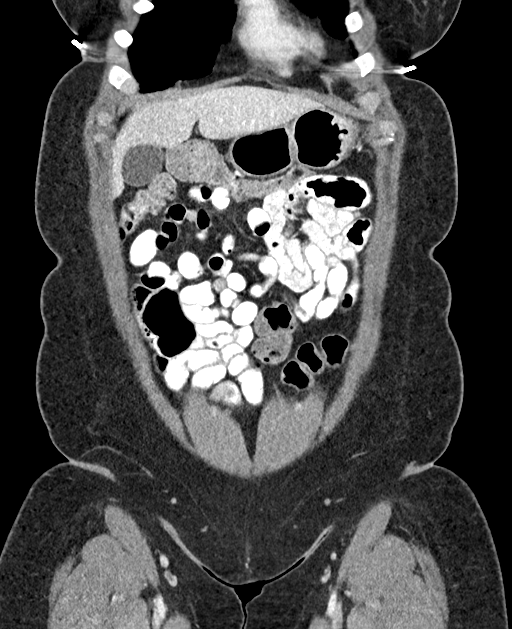
[im 93/210  soft-tissue]
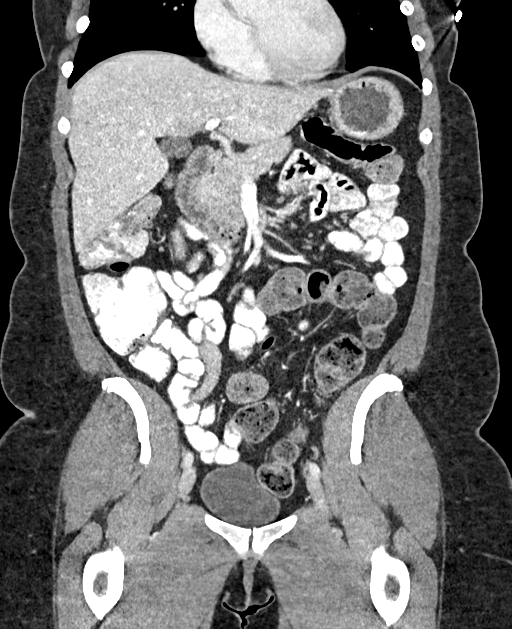
[im 117/210  soft-tissue]
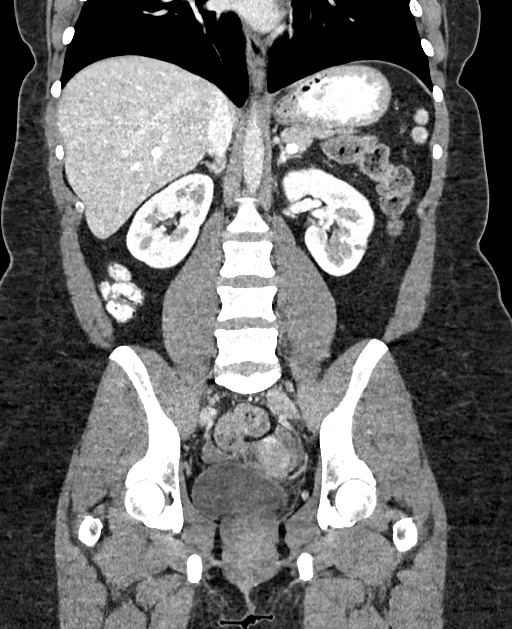

[16 of 46 positions shown; findings below may reference images not displayed]

FINDINGS: LOWER CHEST: Lung bases are clear. Included heart size is normal. No
pericardial effusion.

HEPATOBILIARY: Liver and gallbladder are normal.

PANCREAS: Normal.

SPLEEN: Normal.

ADRENALS/URINARY TRACT: Kidneys are orthotopic, demonstrating
symmetric enhancement. No nephrolithiasis, hydronephrosis or solid
renal masses. Urinary bladder is partially distended and
unremarkable. Normal adrenal glands.

STOMACH/BOWEL: The stomach, small and large bowel are normal in
course and caliber without inflammatory changes. Normal appendix.
Moderate fecal retention within the descending colon through rectum.

VASCULAR/LYMPHATIC: Aortoiliac vessels are normal in course and
caliber. No lymphadenopathy by CT size criteria.

REPRODUCTIVE: Normal.

OTHER: No intraperitoneal free fluid or free air.

MUSCULOSKELETAL: No acute osseous abnormality. No discrete soft
tissue mass, adenopathy or fluid collection is identified to
correspond with the patient's reported palpable lumps beneath the
skin in the left upper quadrant.
IMPRESSION: 1. No acute solid nor hollow visceral organ abnormality.
2. Moderate fecal retention within the left colon.
3. No discrete soft tissue mass or fluid collection to account for
the palpable lumps indicated by the patient in the left upper
quadrant. No localizing markers are identified to indicate site of
palpable lumps limiting assessment.

## 2018-12-04 ENCOUNTER — Other Ambulatory Visit: Payer: Self-pay | Admitting: Internal Medicine

## 2018-12-04 MED FILL — ESOMEPRAZOLE MAG DR 40 MG C: 40 | 90 days supply | Qty: 90 | Fill #2

## 2018-12-04 MED FILL — SYNTHROID 100 MCG TABLET: 100 | 90 days supply | Qty: 90 | Fill #0

## 2018-12-04 MED FILL — LARIN FE 1.5-30 TABLET: 1.5-30 | 84 days supply | Qty: 84 | Fill #3

## 2018-12-04 MED FILL — BENAZEPRIL-HYDROCHLOROTHIAZ: 20-12.5 | 90 days supply | Qty: 90 | Fill #1

## 2019-01-08 ENCOUNTER — Other Ambulatory Visit: Payer: Self-pay | Admitting: Internal Medicine

## 2019-01-08 MED FILL — TOPIRAMATE 100 MG TABLET: 100 | 90 days supply | Qty: 90 | Fill #0

## 2019-01-08 NOTE — Telephone Encounter (Signed)
Refill for one year. Check on when CPE is due

## 2019-01-15 MED FILL — FLUoxetine HCL 20 MG CAPS: 20 | 90 days supply | Qty: 90 | Fill #2

## 2019-02-06 ENCOUNTER — Other Ambulatory Visit (HOSPITAL_COMMUNITY): Payer: Self-pay | Admitting: Obstetrics and Gynecology

## 2019-02-28 ENCOUNTER — Other Ambulatory Visit: Payer: Self-pay | Admitting: Internal Medicine

## 2019-02-28 MED FILL — LARIN FE 1.5-30 TABLET: 1.5-30 | 84 days supply | Qty: 84 | Fill #0

## 2019-02-28 MED FILL — BENAZEPRIL-HYDROCHLOROTHIAZ: 20-12.5 | 90 days supply | Qty: 90 | Fill #2

## 2019-02-28 MED FILL — ESOMEPRAZOLE MAG DR 40 MG C: 40 | 90 days supply | Qty: 90 | Fill #3

## 2019-02-28 MED FILL — SYNTHROID 100 MCG TABLET: 100 | 90 days supply | Qty: 90 | Fill #0

## 2019-03-12 ENCOUNTER — Telehealth: Payer: Self-pay | Admitting: Internal Medicine

## 2019-03-12 NOTE — Telephone Encounter (Signed)
She is to have a repeat MRI of her kidney as she had an 8 mm lesion 6 months ago radiologist wanted to follow up on. Please schedule and check previous report to see which kidney

## 2019-03-12 NOTE — Telephone Encounter (Signed)
Dana Washington [phone]  [name] called to say she had a reminder in her phone to call office and remind [place] that she needed to get repeat CT or MRI in 6 months.

## 2019-03-14 ENCOUNTER — Other Ambulatory Visit: Payer: Self-pay

## 2019-03-14 DIAGNOSIS — N289 Disorder of kidney and ureter, unspecified: Secondary | ICD-10-CM

## 2019-03-14 DIAGNOSIS — N281 Cyst of kidney, acquired: Secondary | ICD-10-CM

## 2019-03-14 NOTE — Telephone Encounter (Signed)
Scheduled MRI at [facility] on 03/26/19 at 8:00am, left detailed message for patient and sent her a message through [place]. Auth # [id number]

## 2019-03-26 ENCOUNTER — Other Ambulatory Visit: Payer: Self-pay

## 2019-03-26 ENCOUNTER — Ambulatory Visit (HOSPITAL_COMMUNITY)
Admission: RE | Admit: 2019-03-26 | Discharge: 2019-03-26 | Disposition: A | Discharge status: Home / Self Care | Payer: No Typology Code available for payment source | Source: Ambulatory Visit | Attending: Internal Medicine | Admitting: Internal Medicine

## 2019-03-26 DIAGNOSIS — N289 Disorder of kidney and ureter, unspecified: Secondary | ICD-10-CM | POA: Diagnosis present

## 2019-03-26 LAB — POCT I-STAT CREATININE: Creatinine, Ser: 0.9 mg/dL (ref 0.44–1.00)

## 2019-03-26 MED ORDER — GADOBUTROL 1 MMOL/ML IV SOLN
10.0000 mL | Freq: Once | INTRAVENOUS | Status: AC | PRN
  Administered 2019-03-26: 10 mL via INTRAVENOUS

## 2019-03-27 ENCOUNTER — Telehealth: Payer: Self-pay | Admitting: Internal Medicine

## 2019-03-27 NOTE — Telephone Encounter (Signed)
Faxed referral, office notes, demographics, and MRI results to [facility] to Dr Alexis Frock, fax [phone] and office number [phone] (19 pages)

## 2019-04-08 MED FILL — FLUoxetine HCL 20 MG CAPS: 20 | 90 days supply | Qty: 90 | Fill #3

## 2019-04-08 MED FILL — TOPIRAMATE 100 MG TABLET: 100 | 90 days supply | Qty: 90 | Fill #1

## 2019-04-25 ENCOUNTER — Telehealth: Payer: Self-pay | Admitting: Internal Medicine

## 2019-04-25 ENCOUNTER — Ambulatory Visit (INDEPENDENT_AMBULATORY_CARE_PROVIDER_SITE_OTHER): Payer: No Typology Code available for payment source | Admitting: Internal Medicine

## 2019-04-25 ENCOUNTER — Encounter: Payer: Self-pay | Admitting: Internal Medicine

## 2019-04-25 ENCOUNTER — Other Ambulatory Visit: Payer: Self-pay

## 2019-04-25 VITALS — BP 100/80 | HR 118 | Temp 98.7°F | Ht 65.0 in | Wt 259.0 lb

## 2019-04-25 DIAGNOSIS — G43901 Migraine, unspecified, not intractable, with status migrainosus: Secondary | ICD-10-CM | POA: Diagnosis not present

## 2019-04-25 MED ORDER — ONDANSETRON HCL 4 MG PO TABS: 4.0000 mg | ORAL_TABLET | Freq: Three times a day (TID) | ORAL | 0 refills | Status: DC | PRN

## 2019-04-25 MED ORDER — BUTALBITAL-APAP-CAFFEINE 50-325-40 MG PO TABS: ORAL_TABLET | ORAL | 0 refills | Status: DC

## 2019-04-25 MED ORDER — BUTALBITAL-APAP-CAFFEINE 50-325-40 MG PO TABS: 1.0000 | ORAL_TABLET | Freq: Two times a day (BID) | ORAL | 0 refills | Status: DC | PRN

## 2019-04-25 MED FILL — BUTALB-ACETAMIN-CAFF 50-325: 50-325-40 | 10 days supply | Qty: 30 | Fill #0

## 2019-04-25 MED FILL — ONDANSETRON HCL 4 MG TABLET: 4 | 10 days supply | Qty: 30 | Fill #0

## 2019-04-25 NOTE — Progress Notes (Signed)
   Subjective:    Patient ID: Dana Washington, female    DOB: Jun 18, 1975, 44 y.o.   MRN: [id number]  HPI 44 year old Female seen acutely today for migraine headache that had onset  yesterday for most of the day.  Headache got worse last night. Has photophobia and nausea.  Tried to go to work today but did not feel well due to pounding headache.  She has some Fioricet at home but it had expired.  Longstanding history of migraine headaches.  Job is stressful.  Saw Dr. Tresa Moore recently regarding left kidney abnormality noted on recent MRI that was originally diagnosed on CT abdomen and pelvis in September 2020 during evaluation for abdominal pain and constipation.  Was noted to have 11 mm mass nonspecific in the left kidney with perhaps mild enhancement.  Thought to be a complicated cyst with enhancing septations versus solid mass.  Dr. Tresa Moore feels that it is a complicated cystic mass and he will be following this carefully.    Review of Systems see above     Objective:   Physical Exam She is afebrile.  Blood pressure 100/80, pulse 118, temperature 98.7 degrees pulse oximetry 98% BMI 43.10.  Weight 259 pounds.  PERRLA.  Extraocular movements are full.  Cranial nerves II through XII are intact.  Muscle strength is normal in the upper and lower extremities.  Normal affect thought and judgment.  Able to give a clear concise history.  Muscle strength is normal.       Assessment & Plan:  Protracted migraine headache  Plan: Renew Fioricet and may take 1 tablet every 8 hours as needed for migraine headache.  Rest and drink plenty of fluids.  Call if not improving in 24 hours or sooner if worse.  Was prescribed Topamax in December for prevention of migraine.  Zofran 4 mg tablet 1 p.o. every 8 hours as needed for nausea.

## 2019-04-25 NOTE — Telephone Encounter (Addendum)
Dana Washington called to say she has been fighting a migraine with nausea for the last 2 days, and it is not getting better. She stated that she had some fioricet left from 2016 and she took  the last one she had. She also has some prednisone at home. I let her know since we have not seen her for this in awhile she may need some type of office visit.

## 2019-04-25 NOTE — Telephone Encounter (Signed)
Have her come at 2:30 pm

## 2019-04-26 NOTE — Patient Instructions (Signed)
Zofran as needed for nausea.  Fioricet refilled 1 p.o. every 8 hours as needed migraine headache.  Rest and drink plenty of fluids.  Call if not better in 24 to 48 hours or sooner if worse.

## 2019-05-07 ENCOUNTER — Telehealth: Payer: Self-pay | Admitting: Internal Medicine

## 2019-05-07 NOTE — Telephone Encounter (Signed)
Doratha called to say she needed a referral for her insurance that  She has with [facility] to go to [employer]. I let her know I think al she has to do is let us know she is going and then contact [facility] plan to let them know prior to her eye doctor visit.

## 2019-05-08 MED FILL — NEO/POLY/DEXAMET EYE OINT: 3.5-10000-0 | 15 days supply | Qty: 4 | Fill #0

## 2019-05-26 ENCOUNTER — Other Ambulatory Visit: Payer: Self-pay | Admitting: Internal Medicine

## 2019-06-11 ENCOUNTER — Telehealth: Payer: No Typology Code available for payment source | Admitting: Family

## 2019-06-11 DIAGNOSIS — J019 Acute sinusitis, unspecified: Secondary | ICD-10-CM | POA: Diagnosis not present

## 2019-06-11 MED ORDER — AMOXICILLIN-POT CLAVULANATE 875-125 MG PO TABS: 1.0000 | ORAL_TABLET | Freq: Two times a day (BID) | ORAL | 0 refills | Status: DC

## 2019-06-11 NOTE — Progress Notes (Signed)

## 2019-06-19 ENCOUNTER — Other Ambulatory Visit: Payer: Self-pay

## 2019-06-19 ENCOUNTER — Other Ambulatory Visit: Payer: No Typology Code available for payment source | Admitting: Internal Medicine

## 2019-06-19 DIAGNOSIS — F419 Anxiety disorder, unspecified: Secondary | ICD-10-CM

## 2019-06-19 DIAGNOSIS — I10 Essential (primary) hypertension: Secondary | ICD-10-CM

## 2019-06-19 DIAGNOSIS — Z1321 Encounter for screening for nutritional disorder: Secondary | ICD-10-CM

## 2019-06-19 DIAGNOSIS — E039 Hypothyroidism, unspecified: Secondary | ICD-10-CM

## 2019-06-19 DIAGNOSIS — F429 Obsessive-compulsive disorder, unspecified: Secondary | ICD-10-CM

## 2019-06-19 DIAGNOSIS — Z0000 Encounter for general adult medical examination without abnormal findings: Secondary | ICD-10-CM

## 2019-06-19 DIAGNOSIS — R7302 Impaired glucose tolerance (oral): Secondary | ICD-10-CM

## 2019-06-19 DIAGNOSIS — E063 Autoimmune thyroiditis: Secondary | ICD-10-CM

## 2019-06-19 NOTE — Addendum Note (Signed)
Addended by: Mady Haagensen on: 06/19/2019 11:27 AM   Modules accepted: Orders

## 2019-06-20 LAB — LIPID PANEL
Cholesterol: 225 mg/dL — ABNORMAL HIGH (ref <200)
HDL: 51 mg/dL (ref >=50)
LDL Cholesterol (Calc): 147 mg/dL (calc) — ABNORMAL HIGH
Non-HDL Cholesterol (Calc): 174 mg/dL (calc) — ABNORMAL HIGH (ref <130)
Total CHOL/HDL Ratio: 4.4 (calc) (ref <5.0)
Triglycerides: 145 mg/dL (ref <150)

## 2019-06-20 LAB — CBC WITH DIFFERENTIAL/PLATELET
Absolute Monocytes: 516 cells/uL (ref 200–950)
Basophils Absolute: 77 cells/uL (ref 0–200)
Basophils Relative: 0.9 %
Eosinophils Absolute: 138 cells/uL (ref 15–500)
Eosinophils Relative: 1.6 %
HCT: 44.5 % (ref 35.0–45.0)
Hemoglobin: 14.7 g/dL (ref 11.7–15.5)
Lymphs Abs: 1849 cells/uL (ref 850–3900)
MCH: 29.2 pg (ref 27.0–33.0)
MCHC: 33 g/dL (ref 32.0–36.0)
MCV: 88.3 fL (ref 80.0–100.0)
MPV: 9 fL (ref 7.5–12.5)
Monocytes Relative: 6 %
Neutro Abs: 6020 cells/uL (ref 1500–7800)
Neutrophils Relative %: 70 %
Platelets: 431 10*3/uL — ABNORMAL HIGH (ref 140–400)
RBC: 5.04 10*6/uL (ref 3.80–5.10)
RDW: 13.8 % (ref 11.0–15.0)
Total Lymphocyte: 21.5 %
WBC: 8.6 10*3/uL (ref 3.8–10.8)

## 2019-06-20 LAB — HEMOGLOBIN A1C
Hgb A1c MFr Bld: 5.5 % of total Hgb (ref <5.7)
Mean Plasma Glucose: 111 (calc)
eAG (mmol/L): 6.2 (calc)

## 2019-06-20 LAB — COMPLETE METABOLIC PANEL WITH GFR
AG Ratio: 1.5 (calc) (ref 1.0–2.5)
ALT: 10 U/L (ref 6–29)
AST: 13 U/L (ref 10–35)
Albumin: 4.3 g/dL (ref 3.6–5.1)
Alkaline phosphatase (APISO): 79 U/L (ref 31–125)
BUN: 11 mg/dL (ref 7–25)
CO2: 24 mmol/L (ref 20–32)
Calcium: 9.2 mg/dL (ref 8.6–10.2)
Chloride: 103 mmol/L (ref 98–110)
Creat: 0.88 mg/dL (ref 0.50–1.10)
GFR, Est African American: 92 mL/min/{1.73_m2} (ref >=60)
GFR, Est Non African American: 79 mL/min/{1.73_m2} (ref >=60)
Globulin: 2.9 g/dL (calc) (ref 1.9–3.7)
Glucose, Bld: 87 mg/dL (ref 65–99)
Potassium: 4.7 mmol/L (ref 3.5–5.3)
Sodium: 138 mmol/L (ref 135–146)
Total Bilirubin: 0.3 mg/dL (ref 0.2–1.2)
Total Protein: 7.2 g/dL (ref 6.1–8.1)

## 2019-06-20 LAB — VITAMIN D 25 HYDROXY (VIT D DEFICIENCY, FRACTURES): Vit D, 25-Hydroxy: 32 ng/mL (ref 30–100)

## 2019-06-20 LAB — TSH: TSH: 0.99 mIU/L

## 2019-06-25 ENCOUNTER — Other Ambulatory Visit: Payer: No Typology Code available for payment source | Admitting: Internal Medicine

## 2019-06-28 ENCOUNTER — Other Ambulatory Visit: Payer: Self-pay

## 2019-06-28 ENCOUNTER — Encounter: Payer: Self-pay | Admitting: Internal Medicine

## 2019-06-28 ENCOUNTER — Ambulatory Visit (INDEPENDENT_AMBULATORY_CARE_PROVIDER_SITE_OTHER): Payer: No Typology Code available for payment source | Admitting: Internal Medicine

## 2019-06-28 VITALS — BP 110/80 | HR 93 | Ht 65.0 in | Wt 268.0 lb

## 2019-06-28 DIAGNOSIS — Z8669 Personal history of other diseases of the nervous system and sense organs: Secondary | ICD-10-CM

## 2019-06-28 DIAGNOSIS — Z6841 Body Mass Index (BMI) 40.0 and over, adult: Secondary | ICD-10-CM | POA: Diagnosis not present

## 2019-06-28 DIAGNOSIS — K5909 Other constipation: Secondary | ICD-10-CM

## 2019-06-28 DIAGNOSIS — Z0000 Encounter for general adult medical examination without abnormal findings: Secondary | ICD-10-CM

## 2019-06-28 DIAGNOSIS — G43901 Migraine, unspecified, not intractable, with status migrainosus: Secondary | ICD-10-CM

## 2019-06-28 DIAGNOSIS — T466X5A Adverse effect of antihyperlipidemic and antiarteriosclerotic drugs, initial encounter: Secondary | ICD-10-CM

## 2019-06-28 DIAGNOSIS — K5904 Chronic idiopathic constipation: Secondary | ICD-10-CM

## 2019-06-28 DIAGNOSIS — F419 Anxiety disorder, unspecified: Secondary | ICD-10-CM

## 2019-06-28 DIAGNOSIS — E7800 Pure hypercholesterolemia, unspecified: Secondary | ICD-10-CM

## 2019-06-28 DIAGNOSIS — E039 Hypothyroidism, unspecified: Secondary | ICD-10-CM

## 2019-06-28 DIAGNOSIS — E8881 Metabolic syndrome: Secondary | ICD-10-CM

## 2019-06-28 DIAGNOSIS — I10 Essential (primary) hypertension: Secondary | ICD-10-CM

## 2019-06-28 DIAGNOSIS — R7302 Impaired glucose tolerance (oral): Secondary | ICD-10-CM

## 2019-06-28 DIAGNOSIS — M791 Myalgia, unspecified site: Secondary | ICD-10-CM

## 2019-06-28 DIAGNOSIS — Z9889 Other specified postprocedural states: Secondary | ICD-10-CM

## 2019-06-28 DIAGNOSIS — F32A Depression, unspecified: Secondary | ICD-10-CM

## 2019-06-28 LAB — POCT URINALYSIS DIPSTICK
Appearance: NEGATIVE
Bilirubin, UA: NEGATIVE
Glucose, UA: NEGATIVE
Ketones, UA: NEGATIVE
Leukocytes, UA: NEGATIVE
Nitrite, UA: NEGATIVE
Odor: NEGATIVE
Protein, UA: NEGATIVE
Spec Grav, UA: 1.015 (ref 1.010–1.025)
Urobilinogen, UA: 0.2 E.U./dL
pH, UA: 6.5 (ref 5.0–8.0)

## 2019-06-28 NOTE — Progress Notes (Signed)
Subjective:    Patient ID: Dana Washington, female    DOB: 01-23-1975, 44 y.o.   MRN: [id number]  HPI 44 year old Female for health maintenance exam and evaluation of medical issues.  She nearly has her Masters degree.  I am very proud of her.  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  She has a history of essential hypertension, hyperlipidemia, obesity, metabolic syndrome, impaired glucose tolerance, migraine headaches and hypothyroidism.  History of functional constipation and has seen by Dr. Carlean Purl.  She tried a statin medication but developed myalgias while taking Biaxin.  Currently not on any lipid-lowering medication.  I think if she is not on Biaxin she likely will not have myalgias.  For impaired glucose tolerance she is on Metformin.  Has Xanax for anxiety  Is on benazepril HCTZ for hypertension and takes Fioricet for migraine headaches.  Takes Nexium for GE reflux.  Takes Synthroid 100 mcg daily for hypothyroidism.  History of anxiety and depression she is treated with Prozac 20 mg daily.  History of migraine headaches treated prophylactically with Topamax.  Says things are going fairly well at work.  Dr. Georgette Dover removed a lipoma from her upper abdominal wall left side April 2019.  He had breast reduction surgery May 2010.  She had bilateral carpal tunnel release February 2012.  LASEK surgery 2008 for myopia.  History of Hashimoto's thyroiditis with resultant hypothyroidism.  History of vitamin D deficiency.  Benign lymph node biopsy February 2010.  History of seroma left breast which was a result of her breast reduction surgery in 2010.  20% rotator cuff and osteoarthritis AC joint treated surgically by Dr. Theda Sers September 2014 with partial clavicle resection.  She also had a glenoid labral tear.  History of urticaria February 2018 treated with steroids, Zyrtec and Zantac.    Review of Systems    Constitutional: Negative.   Respiratory: Negative.   Cardiovascular: Negative.   Gastrointestinal: Negative.        History of reflux  Genitourinary: Negative.   Neurological:       History of migraine headaches  Psychiatric/Behavioral:       Anxiety and depression       Objective:   Physical Exam Vitals reviewed.  Constitutional:      General: She is not in acute distress.    Appearance: Normal appearance. She is obese.  HENT:     Head: Normocephalic and atraumatic.     Right Ear: Tympanic membrane normal.     Left Ear: Tympanic membrane normal.     Nose: Nose normal.  Eyes:     Extraocular Movements: Extraocular movements intact.     Conjunctiva/sclera: Conjunctivae normal.     Pupils: Pupils are equal, round, and reactive to light.  Neck:     Vascular: No carotid bruit.     Comments: No thyromegaly Cardiovascular:     Rate and Rhythm: Normal rate and regular rhythm.     Heart sounds: Normal heart sounds. No murmur heard.   Pulmonary:     Effort: Pulmonary effort is normal. No respiratory distress.     Breath sounds: Normal breath sounds.  Abdominal:     General: Bowel sounds are normal.     Palpations: Abdomen is soft. There is no mass.     Tenderness: There is no abdominal tenderness. There is no guarding.  Genitourinary:    Comments: Deferred to GYN Musculoskeletal:  Cervical back: Neck supple. No rigidity.  Lymphadenopathy:     Cervical: No cervical adenopathy.  Skin:    General: Skin is warm and dry.  Neurological:     General: No focal deficit present.     Mental Status: She is alert.  Psychiatric:        Mood and Affect: Mood normal.        Behavior: Behavior normal.        Thought Content: Thought content normal.        Judgment: Judgment normal.           Assessment & Plan:  BMI 44.60.  Suggest Dr. Migdalia Dk weight loss clinic for weight watchers.  Was successful at one point with weight watchers.  GE reflux treated with  PPI  Anxiety and depression treated with SSRI and as needed Xanax  Functional constipation-we briefly tried Linzess at one point.  Dr. Carlean Purl felt she could manage this on her own without medication.  Hypothyroidism due to Hashimoto's thyroiditis stable on thyroid replacement therapy.  Hyperlipidemia plan follow-up statin at present time due to myalgias but was taking Biaxin when the myalgias occurred along with a statin medication.  History of migraine headaches  Status post breast reduction surgery 2010  History of vitamin D deficiency  History of seroma left breast as a result of breast reduction surgery  History of partial clavicle resection Dr. Theda Sers September 2014 with 20% rotator cuff tear and osteoarthritis of the [facility] joint as well as the glenoid labral tear  History of urticaria February 2018  Plan: Discussion regarding diet exercise and weight loss.  Continue PPI.  Continue thyroid replacement and antihypertensive medication.  Watch diet.  Take Fioricet immediately at onset of migraine.  Continue Topamax.  Is on oral contraceptives.  She has had a protracted migraine  lasting several days.  I have given her a course of prednisone to take in tapering course going with 6 tablets day 1 and decreasing by 10 tablets daily.

## 2019-07-02 MED FILL — TOPIRAMATE 100 MG TABLET: 100 | 90 days supply | Qty: 90 | Fill #2

## 2019-07-06 ENCOUNTER — Telehealth: Payer: Self-pay

## 2019-07-06 MED ORDER — PREDNISONE 10 MG PO TABS: ORAL_TABLET | ORAL | 0 refills | Status: DC

## 2019-07-06 MED FILL — predniSONE 10 MG (21) TBPK: 10 | 6 days supply | Qty: 21 | Fill #0

## 2019-07-06 NOTE — Telephone Encounter (Signed)
Patient called she was here last week and she had sinus pressure she said you offered her prednisone and she refused it but now she wants to know if you can send it to [facility].

## 2019-07-06 NOTE — Telephone Encounter (Signed)
Call in tapering course of prednisone 10 mg  tabs (#21) 6-5-4-3-2-1

## 2019-07-11 NOTE — Patient Instructions (Addendum)
It was a pleasure to see you today.  Continue current medications.  May take prednisone in tapering course for status migrainosus.  Consider Dr. Migdalia Dk clinic for weight loss.  Continue with Topamax to prevent migraine headaches.  Take vitamin D supplement.  Continue PPI for reflux.  Return in 6 months

## 2019-07-16 ENCOUNTER — Other Ambulatory Visit: Payer: Self-pay | Admitting: Internal Medicine

## 2019-07-17 ENCOUNTER — Other Ambulatory Visit: Payer: Self-pay | Admitting: Internal Medicine

## 2019-07-17 MED FILL — FLUoxetine HCL 20 MG CAPS: 20 | 90 days supply | Qty: 90 | Fill #0

## 2019-07-21 IMAGING — MG DIGITAL SCREENING BILATERAL MAMMOGRAM WITH TOMO AND CAD
8 series · 8 of 24 positions shown · non-contrast
Comparison: Previous exam(s).

CLINICAL DATA: Screening.

EXAM:
DIGITAL SCREENING BILATERAL MAMMOGRAM WITH TOMO AND CAD

[R CC synth-2D]
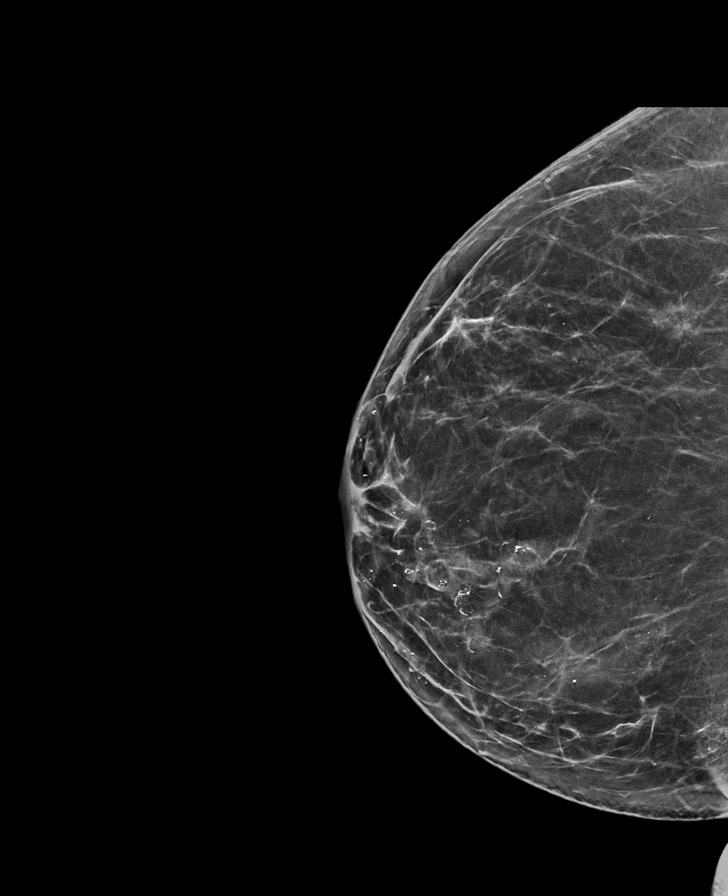

[L CC synth-2D]
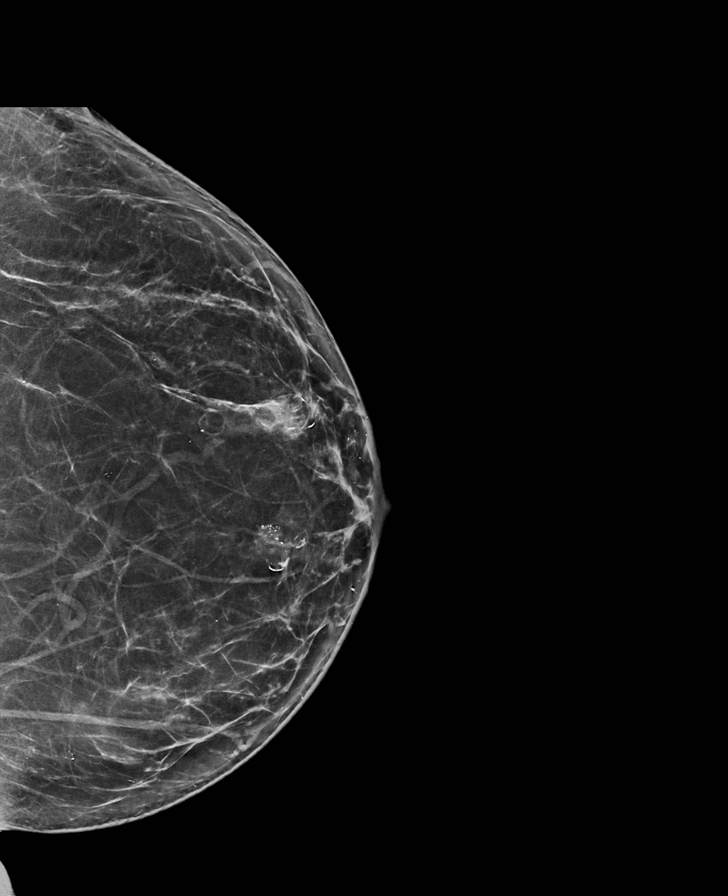

[R MLO synth-2D]
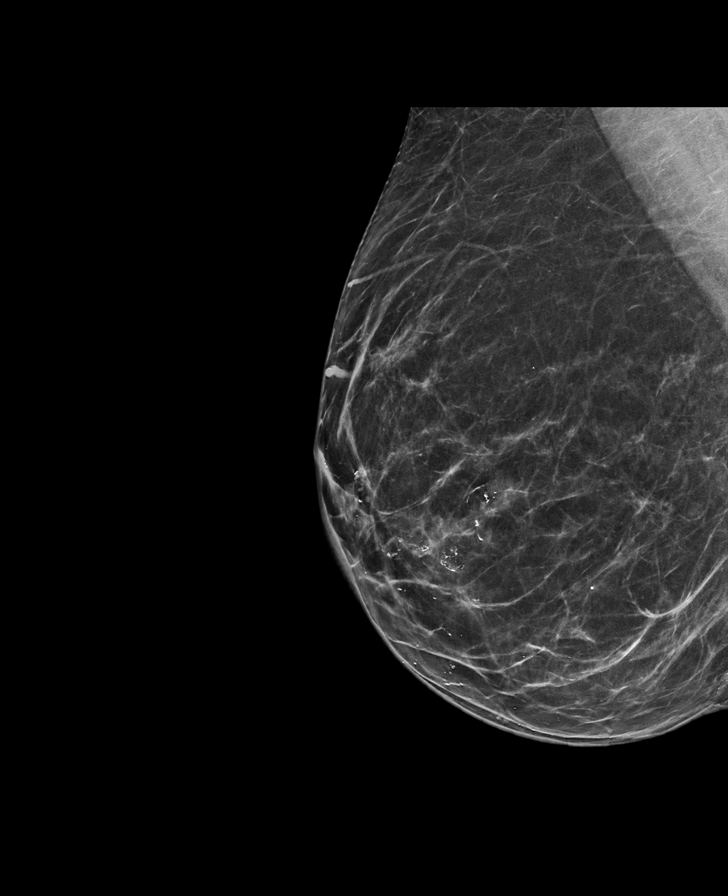

[L MLO synth-2D]
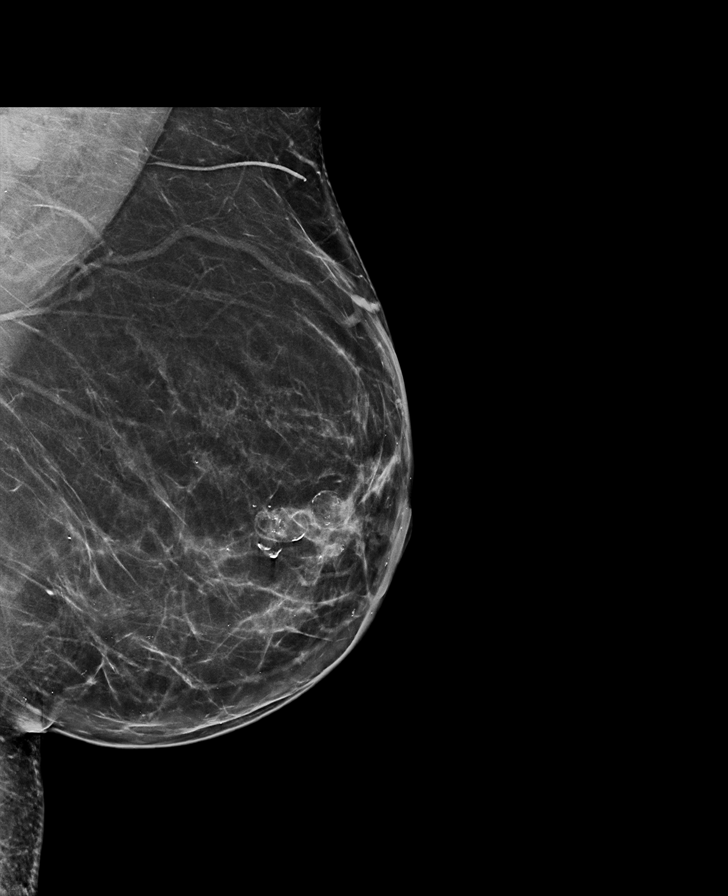

[R CC tomo · tomo slice 38/75.0]
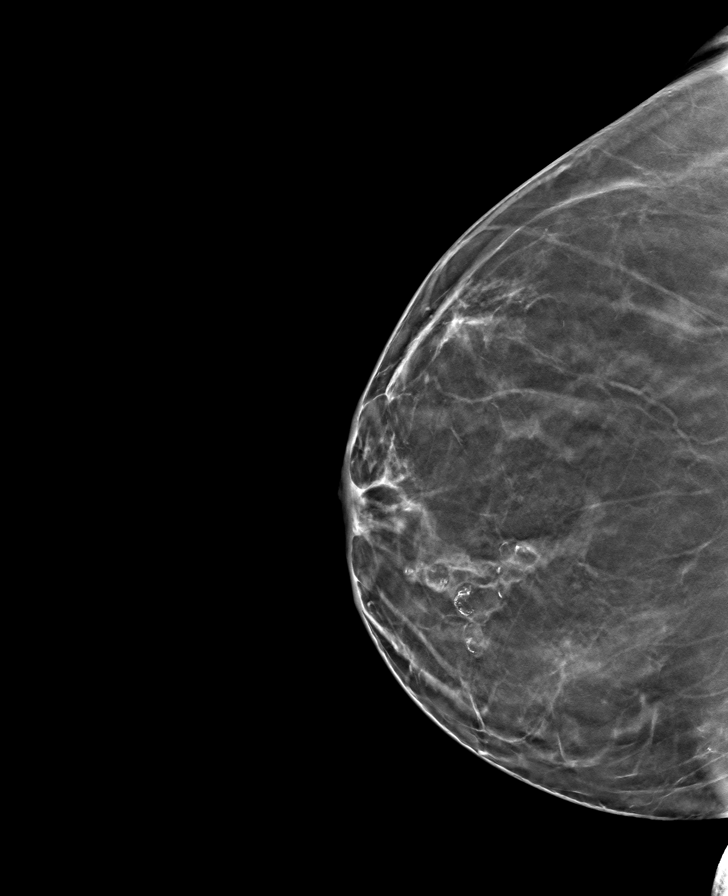

[R MLO tomo · tomo slice 39/76.0]
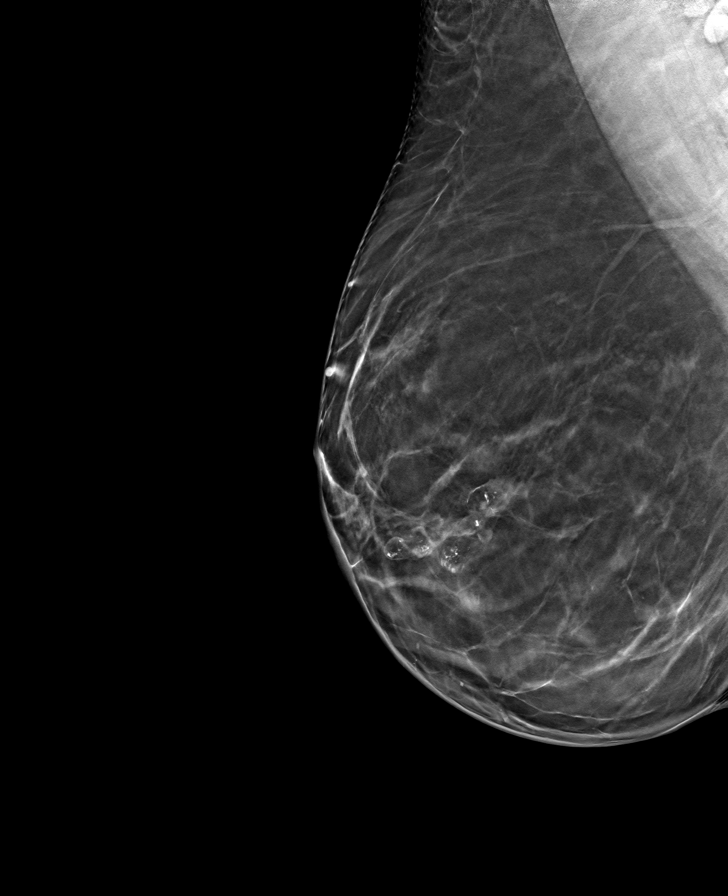

[L MLO tomo · tomo slice 43/85.0]
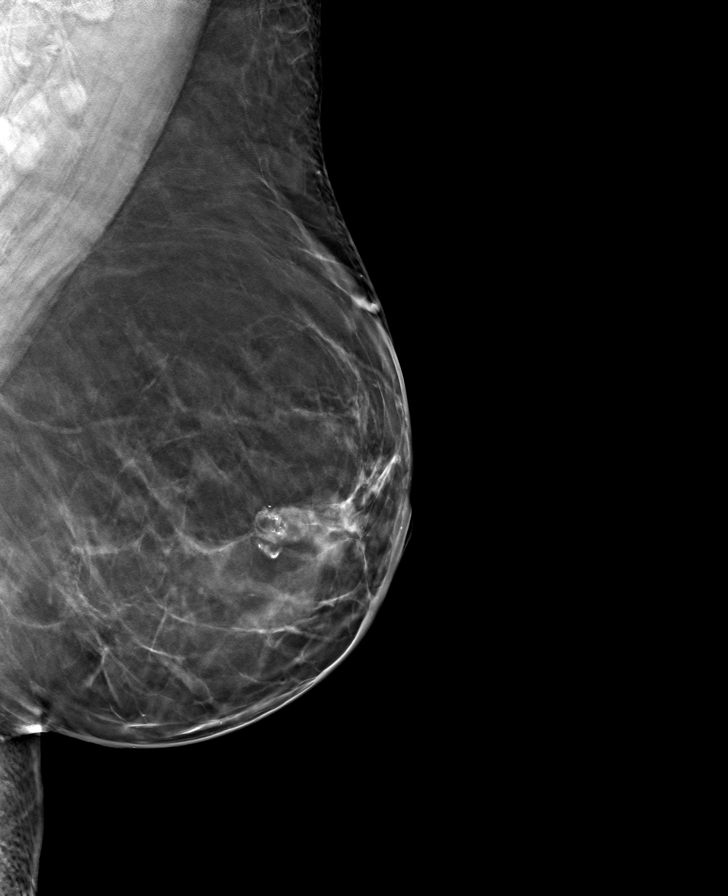

[L CC tomo · tomo slice 40/79.0]
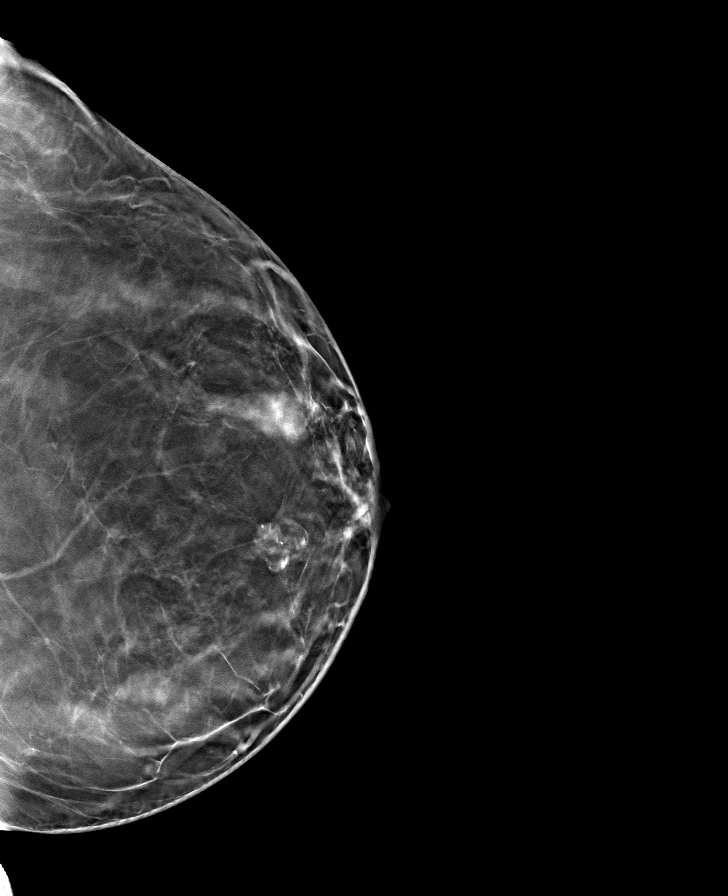

[8 of 24 positions shown; findings below may reference images not displayed]

ACR Breast Density Category b: There are scattered areas of
fibroglandular density.
FINDINGS: There are no findings suspicious for malignancy. Images were
processed with CAD.
IMPRESSION: No mammographic evidence of malignancy. A result letter of this
screening mammogram will be mailed directly to the patient.

RECOMMENDATION:
Screening mammogram in one year. (Code:[id number])

BI-RADS CATEGORY  1: Negative.

## 2019-08-18 MED FILL — JUNEL FE 1.5/30 TABLET: 1.5-30 | 84 days supply | Qty: 84 | Fill #1

## 2019-08-27 ENCOUNTER — Other Ambulatory Visit: Payer: Self-pay | Admitting: Internal Medicine

## 2019-08-27 MED FILL — SYNTHROID 100 MCG TABLET: 100 | 90 days supply | Qty: 90 | Fill #0

## 2019-08-27 MED FILL — BENAZEPRIL-HYDROCHLOROTHIAZ: 20-12.5 | 90 days supply | Qty: 90 | Fill #0

## 2019-10-02 ENCOUNTER — Ambulatory Visit: Payer: No Typology Code available for payment source

## 2019-10-08 MED FILL — TOPIRAMATE 100 MG TABLET: 100 | 90 days supply | Qty: 90 | Fill #3

## 2019-10-08 MED FILL — FLUoxetine HCL 20 MG CAPS: 20 | 90 days supply | Qty: 90 | Fill #1

## 2019-10-20 ENCOUNTER — Telehealth: Payer: No Typology Code available for payment source | Admitting: Emergency Medicine

## 2019-10-20 ENCOUNTER — Other Ambulatory Visit: Payer: Self-pay | Admitting: Emergency Medicine

## 2019-10-20 DIAGNOSIS — H5789 Other specified disorders of eye and adnexa: Secondary | ICD-10-CM

## 2019-10-20 MED ORDER — ERYTHROMYCIN 5 MG/GM OP OINT: 1.0000 "application " | TOPICAL_OINTMENT | Freq: Four times a day (QID) | OPHTHALMIC | 0 refills | Status: DC

## 2019-10-20 MED FILL — ERYTHROMYCIN EYE OINTMENT: 5 | 7 days supply | Qty: 4 | Fill #0

## 2019-10-20 NOTE — Progress Notes (Signed)
Time spent: 10 min  Dana Washington,  We are sorry that you are not feeling well.  Here is how we plan to help!  Based on what you have shared with me it looks like you may have sustained a corneal abrasion.  Typically this is diagnosed with an eye exam and fluorescein stain.  Diagnosis is limited via [employer] format however you report classic symptoms including foreign body sensation or scratchiness, tearing, redness.  Corneal abrasions are treated with antibiotic to prevent infection.   I have prescribed Erythromycin 0.5% ophthalmic ointment. Place 1/2 inch rib inside lower eyelid four times daily.   Avoid unnecessary touching of the eye. You should see symptom improvement in 1-2 days after starting the medication regimen.  Call us if symptoms are not improved in 1-2 days.  You may experience sun sensitivity, increased tearing due to abrasion in the cornea but this should improve within 48 hours of treatment.   Please be aware that E-visit survey format is not ideal to diagnose corneal abrasions.  I cannot do an exam and determine the extent of the abrasion.  Large abrasions have a higher risk of infection and can turn into ulcers.    I recommend an in person eye exam by an eye doctor if possible in 48 hours - sooner if symptoms worsen.   In the meantime, the antibiotic can help.    Home Care:  Wash your hands often!  Do not wear your contacts until you complete your treatment plan.  Avoid sharing towels, bed linen, personal items with a person who has pink eye.  See attention for anyone in your home with similar symptoms.  Get Help Right Away If:  Your symptoms do not improve.  You develop blurred or loss of vision.  Your symptoms worsen (increased discharge, pain or redness)  Your e-visit answers were reviewed by a board certified advanced clinical practitioner to complete your personal care plan.  Depending on the condition, your plan could have included both over  the counter or prescription medications.  If there is a problem please reply  once you have received a response from your provider.  Your safety is important to [place].  If you have drug allergies check your prescription carefully.    You can use MyChart to ask questions about today's visit, request a non-urgent call back, or ask for a work or school excuse for 24 hours related to this e-Visit. If it has been greater than 24 hours you will need to follow up with your provider, or enter a new e-Visit to address those concerns.   You will get an e-mail in the next two days asking about your experience.  I hope that your e-visit has been valuable and will speed your recovery. Thank you for using e-visits.

## 2019-10-23 ENCOUNTER — Ambulatory Visit: Payer: No Typology Code available for payment source

## 2019-11-09 MED FILL — BLISOVI FE 1.5/30 1.5-30 MG: 1.5-30 | 84 days supply | Qty: 84 | Fill #2

## 2019-12-03 ENCOUNTER — Other Ambulatory Visit: Payer: Self-pay | Admitting: Internal Medicine

## 2019-12-03 MED FILL — SYNTHROID 100 MCG TABLET: 100 | 90 days supply | Qty: 90 | Fill #0

## 2019-12-03 MED FILL — BENAZEPRIL-HYDROCHLOROTHIAZ: 20-12.5 | 90 days supply | Qty: 90 | Fill #1

## 2019-12-24 ENCOUNTER — Other Ambulatory Visit: Payer: Self-pay | Admitting: Internal Medicine

## 2019-12-24 DIAGNOSIS — R109 Unspecified abdominal pain: Secondary | ICD-10-CM

## 2019-12-24 DIAGNOSIS — R1032 Left lower quadrant pain: Secondary | ICD-10-CM

## 2019-12-24 NOTE — Telephone Encounter (Signed)
This encounter was created in error - please disregard.

## 2020-01-03 ENCOUNTER — Other Ambulatory Visit: Payer: Self-pay | Admitting: Internal Medicine

## 2020-01-03 MED FILL — TOPIRAMATE 100 MG TABLET: 100 | 90 days supply | Qty: 90 | Fill #0

## 2020-01-07 ENCOUNTER — Telehealth: Payer: Self-pay | Admitting: Internal Medicine

## 2020-01-07 NOTE — Telephone Encounter (Signed)
Full today - can come tomorrow

## 2020-01-07 NOTE — Telephone Encounter (Signed)
Dana Washington [phone]  Audi called to say the lump on her left calf that has been there for awhile started to become really painful a few days ago, she stated that it now hurts to walk on it. Would like to come in a be seen.

## 2020-01-07 NOTE — Telephone Encounter (Signed)
scheduled

## 2020-01-08 ENCOUNTER — Other Ambulatory Visit: Payer: Self-pay | Admitting: Internal Medicine

## 2020-01-08 ENCOUNTER — Ambulatory Visit (INDEPENDENT_AMBULATORY_CARE_PROVIDER_SITE_OTHER): Payer: No Typology Code available for payment source | Admitting: Internal Medicine

## 2020-01-08 ENCOUNTER — Other Ambulatory Visit: Payer: Self-pay

## 2020-01-08 ENCOUNTER — Encounter: Payer: Self-pay | Admitting: Internal Medicine

## 2020-01-08 VITALS — BP 110/80 | HR 115 | Temp 98.2°F | Ht 65.0 in | Wt 250.0 lb

## 2020-01-08 DIAGNOSIS — M79662 Pain in left lower leg: Secondary | ICD-10-CM

## 2020-01-08 DIAGNOSIS — F411 Generalized anxiety disorder: Secondary | ICD-10-CM

## 2020-01-08 MED ORDER — FLUCONAZOLE 150 MG PO TABS: 150.0000 mg | ORAL_TABLET | Freq: Once | ORAL | 0 refills | Status: DC

## 2020-01-08 MED ORDER — HYDROCODONE-ACETAMINOPHEN 10-325 MG PO TABS: 1.0000 | ORAL_TABLET | Freq: Four times a day (QID) | ORAL | 0 refills | Status: DC | PRN

## 2020-01-08 MED ORDER — LEVOFLOXACIN 500 MG PO TABS: 500.0000 mg | ORAL_TABLET | Freq: Every day | ORAL | 0 refills | Status: DC

## 2020-01-08 MED ORDER — OMEPRAZOLE 20 MG PO CPDR: 20.0000 mg | DELAYED_RELEASE_CAPSULE | Freq: Every day | ORAL | 1 refills | Status: DC

## 2020-01-08 MED ORDER — ALPRAZOLAM 0.5 MG PO TABS: 0.5000 mg | ORAL_TABLET | Freq: Three times a day (TID) | ORAL | 0 refills | Status: DC | PRN

## 2020-01-08 MED FILL — ALPRAZolam 0.5 MG TABS: 0.5 | 30 days supply | Qty: 90 | Fill #0

## 2020-01-08 MED FILL — HYDROCODON-APAP 10-325: 10-325 | 3 days supply | Qty: 15 | Fill #0

## 2020-01-08 MED FILL — OMEPRAZOLE 20 MG CAP: 20 | 90 days supply | Qty: 90 | Fill #0

## 2020-01-08 MED FILL — FLUCONAZOLE 150 MG TABS: 150 | 1 days supply | Qty: 1 | Fill #0

## 2020-01-08 MED FILL — levoFLOXacin 500 MG TABS: 500 | 10 days supply | Qty: 10 | Fill #0

## 2020-01-12 MED FILL — FLUoxetine HCL 20 MG CAPS: 20 | 90 days supply | Qty: 90 | Fill #2

## 2020-01-30 ENCOUNTER — Encounter: Payer: Self-pay | Admitting: Internal Medicine

## 2020-01-30 NOTE — Patient Instructions (Signed)
I believe this is musculoskeletal pain and unlikely to be DVT.  May take Norco 10/325 1/2 to 1 tablet every 6 hours as needed for pain.  Apply ice or heat to the area and call if not improving in 48 hours or sooner if worse.  Try to keep leg elevated when not walking.

## 2020-01-30 NOTE — Progress Notes (Signed)
   Subjective:    Patient ID: Dana Washington, female    DOB: [birth year], 45 y.o.   MRN: [id number]  HPI 45 year old Female seen today regarding a lump she noticed a while back on her left calf.  She was very concerned recently because it became quite painful.  Now says it hurts to walk and would like evaluation.  No known injury to her calf area.  She has a history of essential hypertension, hyperlipidemia, obesity, metabolic syndrome, impaired glucose tolerance, migraine headaches and hypothyroidism.  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  History of functional constipation seen by gastroenterologist.  She tried statin medication.  Takes metformin for glucose intolerance.  Has anxiety and takes Xanax on a as needed basis.  History of migraine headaches treated with Fioricet and Topamax.  History of GE reflux treated with Nexium.  Dr. Georgette Dover removed a lipoma from her left upper abdominal area April 2019.  She had a benign lymph node biopsy in February 2010.  History of breast reduction surgery with development of seroma left breast.  Breast reduction surgery in May 2018.  Bilateral carpal tunnel release February 2012.  Osteoarthritis of AC joint treated by Dr. Marylyn Ishihara in September 2014 with partial clavicle resection.  History of glenoid labral tear.  History of urticaria in 2018.  History of anxiety and depression treated with Prozac.  Review of Systems see above     Objective:   Physical Exam Blood pressure 110/80 pulse 115-patient is anxious temperature 98.2 degrees pulse oximetry 98% weight 250 pounds BMI 41.60  No pitting edema of the left lower extremity.  She has some mild tenderness of her left calf.  Bevelyn Buckles' sign is negative.  I do not think she has DVT.  Pain seems more lateral than mid calf.  No redness or increased warmth of the extremity.       Assessment & Plan:  Musculoskeletal pain left calf-patient was reassured that this is unlikely to be  a DVT based on symptoms and physical findings.  May take Norco 10/325 1/2 to 1 tablet every 6 hours as needed for pain.  Apply ice or heat to the Area.  Call if not improving in 48 hours or sooner if worse.  Keep leg elevated when not working.

## 2020-01-31 MED FILL — BLISOVI FE 1.5/30 1.5-30 MG: 1.5-30 | 84 days supply | Qty: 84 | Fill #3

## 2020-03-01 IMAGING — CR ABDOMEN - 1 VIEW
1 series · 1 of 1 positions shown · non-contrast
Comparison: CT 03/15/2017.

CLINICAL DATA: Epigastric pain.  Nausea.  Bloating.

EXAM:
ABDOMEN - 1 VIEW

[t abdomen supine]
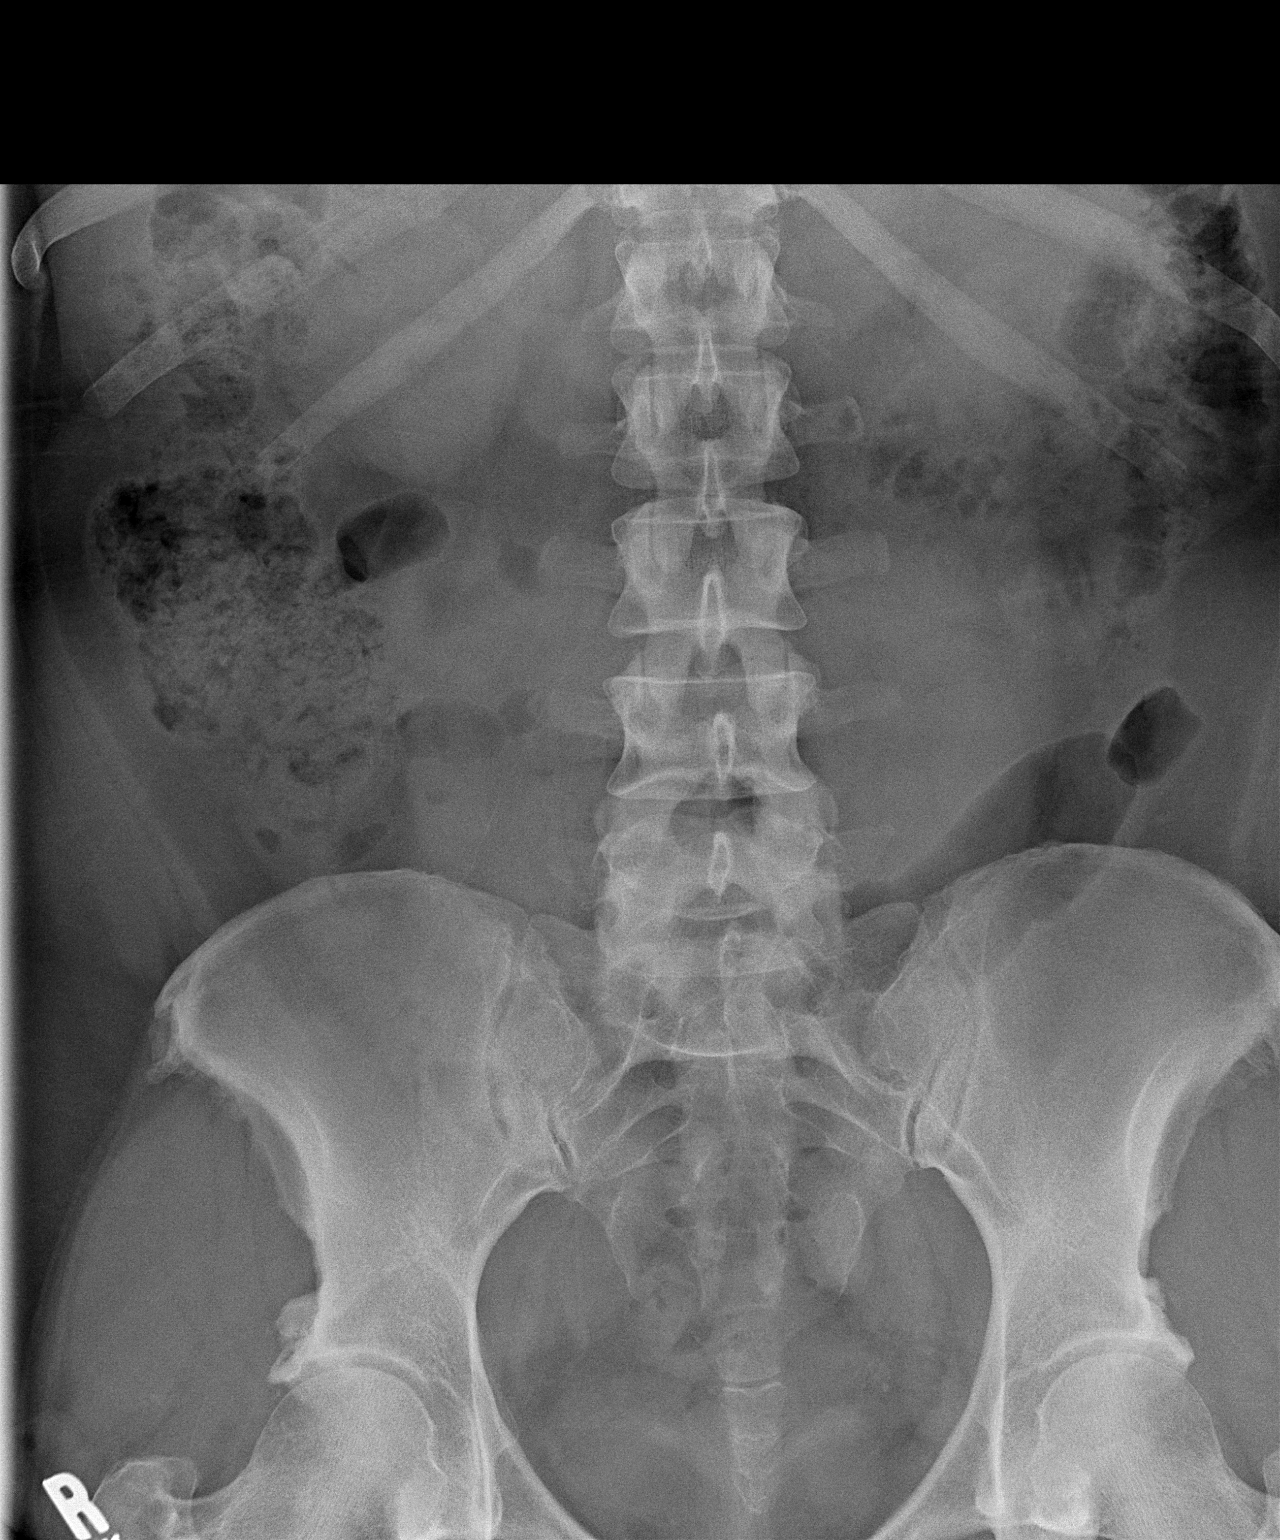

[1 of 1 positions shown; findings below may reference images not displayed]

FINDINGS: Soft tissue structures are unremarkable. No bowel distention.
Moderate amount of stool noted throughout the colon. No free air.
Tiny pelvic calcifications consistent phleboliths. No acute bony
abnormality.
IMPRESSION: No acute abnormality.  Moderate stool volume.  No bowel distention.

## 2020-03-10 ENCOUNTER — Other Ambulatory Visit: Payer: Self-pay | Admitting: Internal Medicine

## 2020-03-10 ENCOUNTER — Telehealth: Payer: Self-pay | Admitting: Internal Medicine

## 2020-03-10 MED FILL — SYNTHROID 100 MCG TABLET: 100 | 90 days supply | Qty: 90 | Fill #0

## 2020-03-10 NOTE — Telephone Encounter (Signed)
Echo called to let us know she is going to see Dr Luberta Robertson at [facility] for Insurance purposes. She has [employer].

## 2020-03-13 ENCOUNTER — Telehealth: Payer: Self-pay | Admitting: Internal Medicine

## 2020-03-13 NOTE — Telephone Encounter (Signed)
Called patient and offered tomorrow, she could not come tomorrow, stated she was not terrible worried she had cardiologist look at the EKG she had done at work, so she wanted to come on Monday.  Scheduled

## 2020-03-13 NOTE — Telephone Encounter (Signed)
Katherleen Broyhill [phone]  [employer] called to say she was having heart papulations last night and wanted to know if she could have a virtual visit, because she had done an EKG at work and strip. I let her know she would need to come in so you could do an EKG and talk with her to see what is going on.

## 2020-03-13 NOTE — Telephone Encounter (Signed)
Yes

## 2020-03-17 ENCOUNTER — Ambulatory Visit: Payer: No Typology Code available for payment source | Admitting: Internal Medicine

## 2020-03-17 NOTE — Telephone Encounter (Signed)
Patient called to cancel her appointment she stated she was feeling much better, that she realized she had not been taking her synthroid for a few days and she feels like this is what was causing the problems. Once she went back on this medication everything cleared up.

## 2020-03-21 ENCOUNTER — Ambulatory Visit (INDEPENDENT_AMBULATORY_CARE_PROVIDER_SITE_OTHER): Payer: No Typology Code available for payment source | Admitting: Podiatry

## 2020-03-21 ENCOUNTER — Other Ambulatory Visit: Payer: Self-pay

## 2020-03-21 ENCOUNTER — Encounter: Payer: Self-pay | Admitting: Podiatry

## 2020-03-21 ENCOUNTER — Telehealth: Payer: Self-pay | Admitting: Internal Medicine

## 2020-03-21 DIAGNOSIS — L600 Ingrowing nail: Secondary | ICD-10-CM | POA: Diagnosis not present

## 2020-03-21 NOTE — Patient Instructions (Signed)

## 2020-03-21 NOTE — Telephone Encounter (Signed)
Dana Washington [phone]  [name] called to say she has got to go and see [facility], so she has notified us for insurance purposes.  [facility]

## 2020-03-23 NOTE — Progress Notes (Signed)
Subjective:   Patient ID: Dana Washington, female   DOB: 45 y.o.   MRN: [id number]   HPI Patient presents stating she has developed a very painful ingrown toenail left big toe and states that its been hard for her to wear shoe gear comfortably   ROS      Objective:  Physical Exam  Neurovascular status intact with incurvated left hallux that is painful in the corner with no redness no drainage noted but very sore     Assessment:  Chronic ingrown toenail deformity left hallux with pain     Plan:  H&P reviewed condition recommended correction explained procedure to patient.  Patient wants surgery understanding risk and signed consent form and I went ahead today I infiltrated the left hallux 60 mg like Marcaine mixture sterile prep done and using sterile instrumentation I remove the border exposed matrix applied phenol 3 applications 30 seconds followed by alcohol lavage sterile dressing.  Gave instructions on soaks and encourage patient to call with any questions which may come up and leave dressing on 24 hours but take it off earlier if needed

## 2020-03-27 ENCOUNTER — Ambulatory Visit: Payer: No Typology Code available for payment source | Admitting: Podiatry

## 2020-04-03 ENCOUNTER — Other Ambulatory Visit (HOSPITAL_BASED_OUTPATIENT_CLINIC_OR_DEPARTMENT_OTHER): Payer: Self-pay

## 2020-04-06 MED FILL — OMEPRAZOLE 20 MG CAP: 20 | 90 days supply | Qty: 90 | Fill #1

## 2020-04-06 MED FILL — FLUoxetine HCL 20 MG CAPS: 20 | 90 days supply | Qty: 90 | Fill #3

## 2020-04-06 MED FILL — TOPIRAMATE 100 MG TABLET: 100 | 90 days supply | Qty: 90 | Fill #1

## 2020-04-09 ENCOUNTER — Encounter: Payer: Self-pay | Admitting: Podiatry

## 2020-04-21 ENCOUNTER — Other Ambulatory Visit (HOSPITAL_COMMUNITY): Payer: Self-pay

## 2020-04-21 ENCOUNTER — Other Ambulatory Visit: Payer: Self-pay

## 2020-04-23 ENCOUNTER — Other Ambulatory Visit (HOSPITAL_COMMUNITY): Payer: Self-pay

## 2020-04-24 ENCOUNTER — Other Ambulatory Visit (HOSPITAL_COMMUNITY): Payer: Self-pay

## 2020-04-24 MED ORDER — NORETHIN ACE-ETH ESTRAD-FE 1.5-30 MG-MCG PO TABS
1.0000 | ORAL_TABLET | Freq: Every day | ORAL | 0 refills | Status: DC
  Filled 2020-04-24: qty 84, 84d supply, fill #0

## 2020-04-26 ENCOUNTER — Telehealth: Payer: No Typology Code available for payment source | Admitting: Physician Assistant

## 2020-04-26 DIAGNOSIS — H1032 Unspecified acute conjunctivitis, left eye: Secondary | ICD-10-CM

## 2020-04-26 MED ORDER — OFLOXACIN 0.3 % OP SOLN: 1.0000 [drp] | Freq: Four times a day (QID) | OPHTHALMIC | 0 refills | Status: DC

## 2020-04-26 NOTE — Progress Notes (Signed)
E-Visit for Dana Washington   We are sorry that you are not feeling well.  Here is how we plan to help!  Based on what you have shared with me it looks like you have conjunctivitis.  Conjunctivitis is a common inflammatory or infectious condition of the eye that is often referred to as "pink eye".  In most cases it is contagious (viral or bacterial). However, not all conjunctivitis requires antibiotics (ex. Allergic).  We have made appropriate suggestions for you based upon your presentation.  I have prescribed Oflaxacin 1-2 drops 4 times a day times 5 days   Pink eye can be highly contagious.  It is typically spread through direct contact with secretions, or contaminated objects or surfaces that one may have touched.  Strict handwashing is suggested with soap and water is urged.  If not available, use alcohol based had sanitizer.  Avoid unnecessary touching of the eye.  If you wear contact lenses, you will need to refrain from wearing them until you see no white discharge from the eye for at least 24 hours after being on medication.  You should see symptom improvement in 1-2 days after starting the medication regimen.  Call us if symptoms are not improved in 1-2 days.  Home Care:  Wash your hands often!  Do not wear your contacts until you complete your treatment plan.  Avoid sharing towels, bed linen, personal items with a person who has pink eye.  See attention for anyone in your home with similar symptoms.  Get Help Right Away If:  Your symptoms do not improve.  You develop blurred or loss of vision.  Your symptoms worsen (increased discharge, pain or redness)  Your e-visit answers were reviewed by a board certified advanced clinical practitioner to complete your personal care plan.  Depending on the condition, your plan could have included both over the counter or prescription medications.  If there is a problem please reply  once you have received a response from your provider.  Your  safety is important to [place].  If you have drug allergies check your prescription carefully.    You can use MyChart to ask questions about today's visit, request a non-urgent call back, or ask for a work or school excuse for 24 hours related to this e-Visit. If it has been greater than 24 hours you will need to follow up with your provider, or enter a new e-Visit to address those concerns.   You will get an e-mail in the next two days asking about your experience.  I hope that your e-visit has been valuable and will speed your recovery. Thank you for using e-visits.  I provided 5 minutes of non face-to-face time during this encounter for chart review and documentation.

## 2020-05-29 ENCOUNTER — Other Ambulatory Visit (HOSPITAL_COMMUNITY): Payer: Self-pay

## 2020-05-29 MED ORDER — NORETHIN ACE-ETH ESTRAD-FE 1.5-30 MG-MCG PO TABS
1.0000 | ORAL_TABLET | Freq: Every day | ORAL | 4 refills | Status: DC
  Filled 2020-05-29 – 2020-07-21 (×4): qty 84, 84d supply, fill #0

## 2020-05-30 ENCOUNTER — Other Ambulatory Visit (HOSPITAL_COMMUNITY): Payer: Self-pay

## 2020-05-30 ENCOUNTER — Other Ambulatory Visit: Payer: Self-pay | Admitting: Internal Medicine

## 2020-05-30 MED ORDER — SYNTHROID 100 MCG PO TABS
100.0000 ug | ORAL_TABLET | Freq: Every day | ORAL | 0 refills | Status: DC
  Filled 2020-05-30: qty 90, 90d supply, fill #0

## 2020-05-30 MED FILL — Benazepril & Hydrochlorothiazide Tab 20-12.5 MG: ORAL | 90 days supply | Qty: 90 | Fill #0 | Status: AC

## 2020-06-02 ENCOUNTER — Other Ambulatory Visit (HOSPITAL_COMMUNITY): Payer: Self-pay

## 2020-06-03 ENCOUNTER — Other Ambulatory Visit (HOSPITAL_COMMUNITY): Payer: Self-pay

## 2020-06-04 ENCOUNTER — Other Ambulatory Visit (HOSPITAL_COMMUNITY): Payer: Self-pay

## 2020-06-12 ENCOUNTER — Other Ambulatory Visit (HOSPITAL_COMMUNITY): Payer: Self-pay

## 2020-06-13 IMAGING — CT CT ABD-PELV W/ CM
2 of 5 series · 16 of 46 positions shown, 18 images · IV contrast (APPLIED)
Comparison: March 15, 2016

CLINICAL DATA: Abdominal pain. Suspected fecal loading in the left
colon.

EXAM:
CT ABDOMEN AND PELVIS WITH CONTRAST
TECHNIQUE: Multidetector CT imaging of the abdomen and pelvis was performed
using the standard protocol following bolus administration of
intravenous contrast.
CONTRAST:  100mL OMNIPAQUE IOHEXOL 300 MG/ML  SOLN

[Series 3: abd/ pelvis 5.0 i30f 2 · axial · 0.98mm/px · z∈[-764,-314]mm · 13 of 100 slices shown, 15 images]
[im 5/100  soft-tissue]
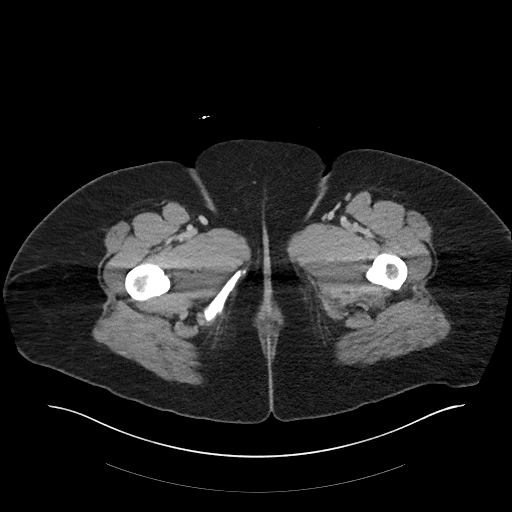
[im 5/100  bone]
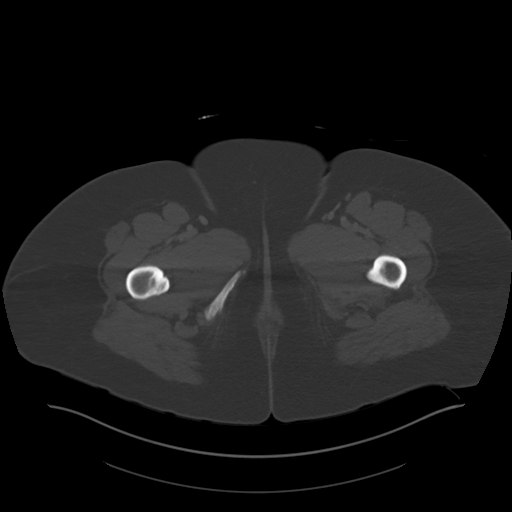
[im 15/100  soft-tissue]
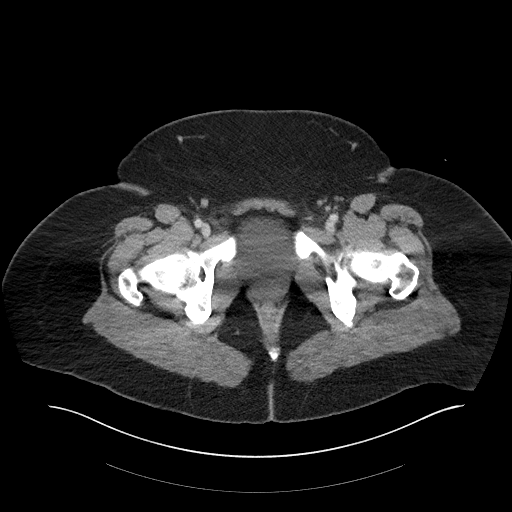
[im 20/100  soft-tissue]
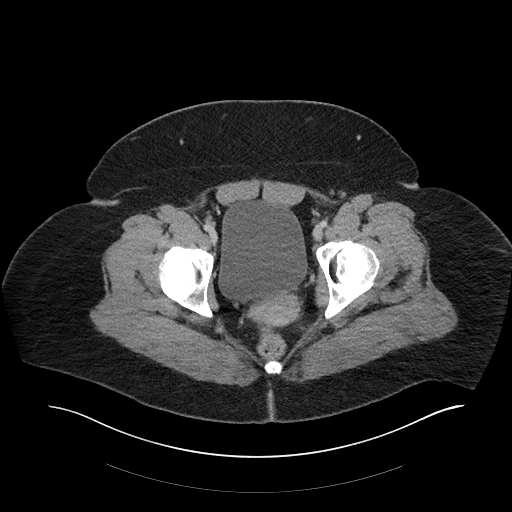
[im 30/100  soft-tissue]
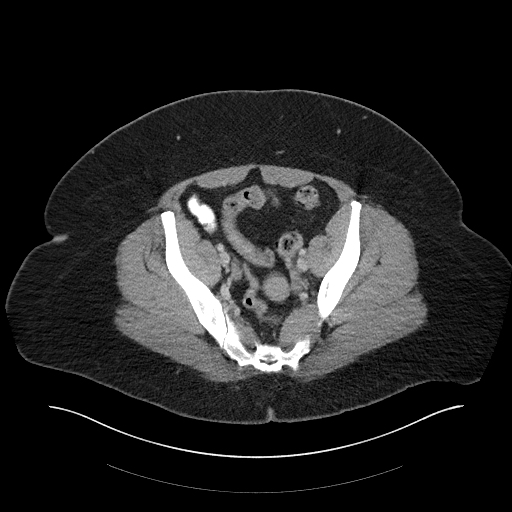
[im 35/100  soft-tissue]
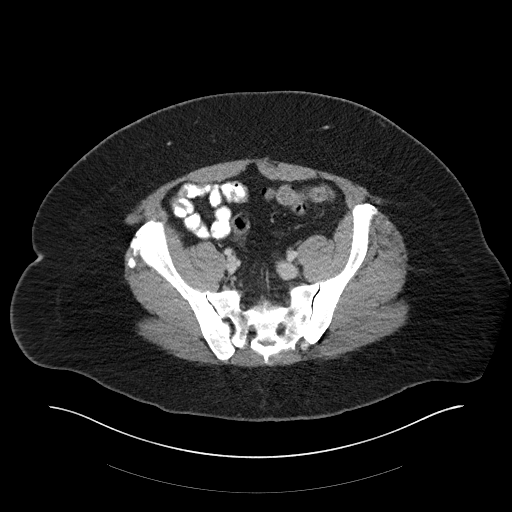
[im 45/100  soft-tissue]
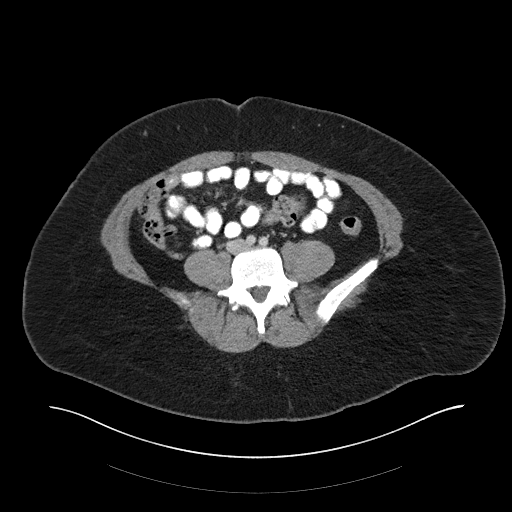
[im 50/100  soft-tissue]
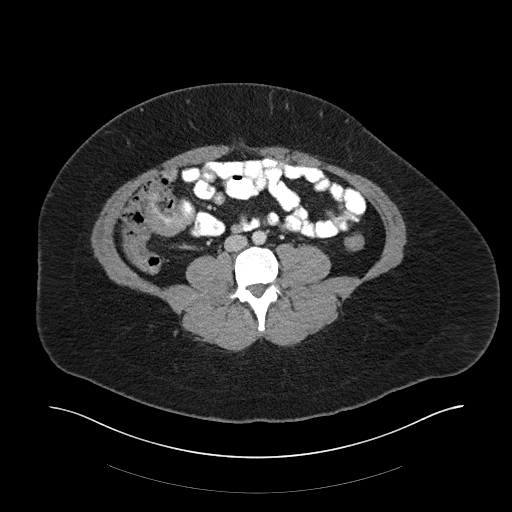
[im 55/100  soft-tissue]
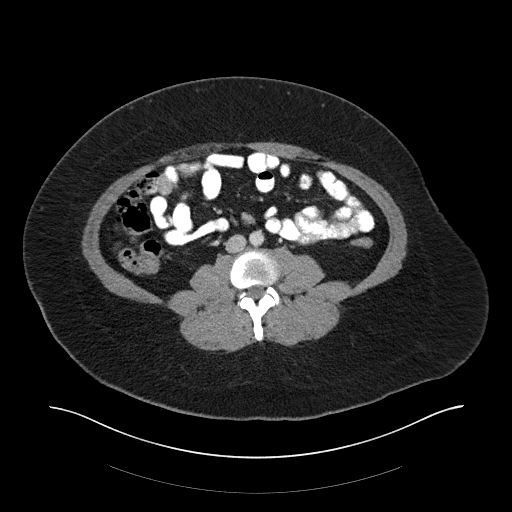
[im 65/100  soft-tissue]
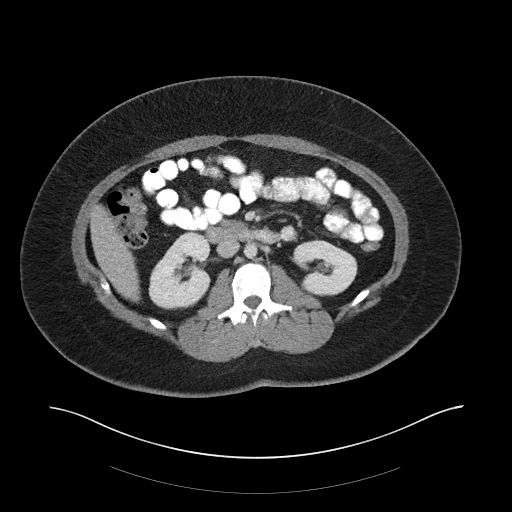
[im 65/100  bone]
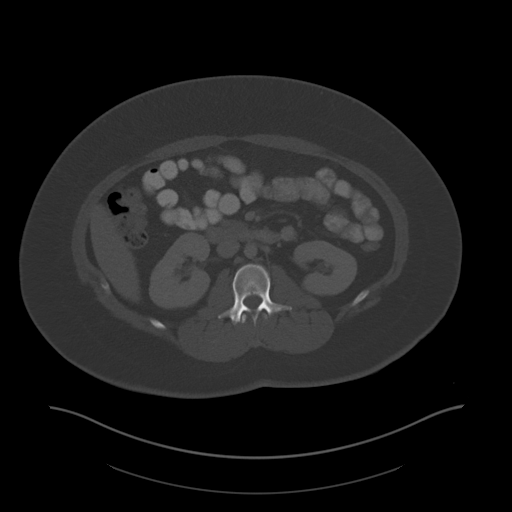
[im 70/100  soft-tissue]
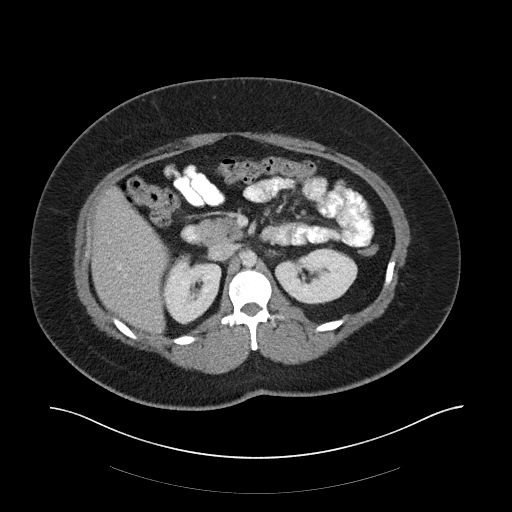
[im 80/100  soft-tissue]
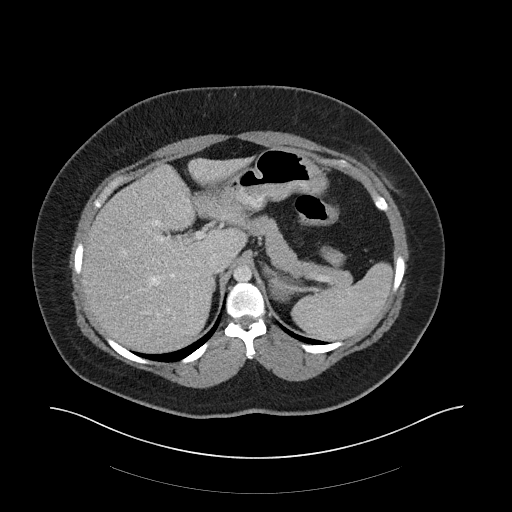
[im 85/100  soft-tissue]
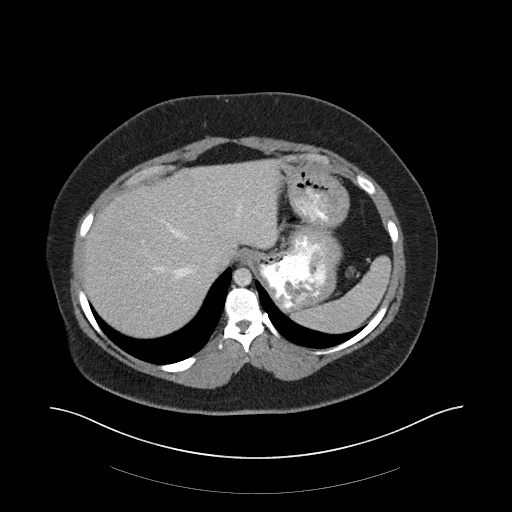
[im 95/100  soft-tissue]
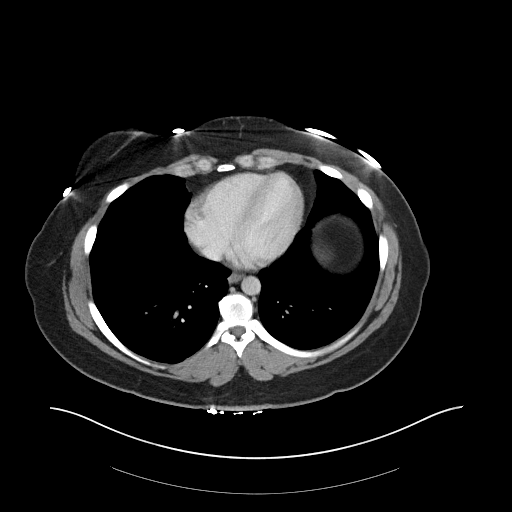

[Series 6: coronal soft tissue · coronal · 0.97mm/px · 3 of 121 slices shown]
[im 41/121  soft-tissue]
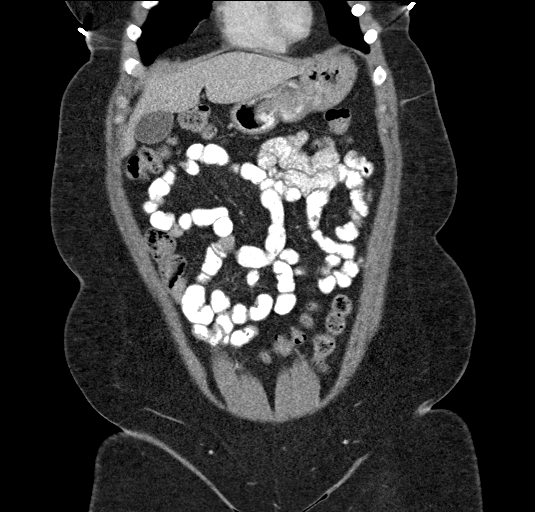
[im 54/121  soft-tissue]
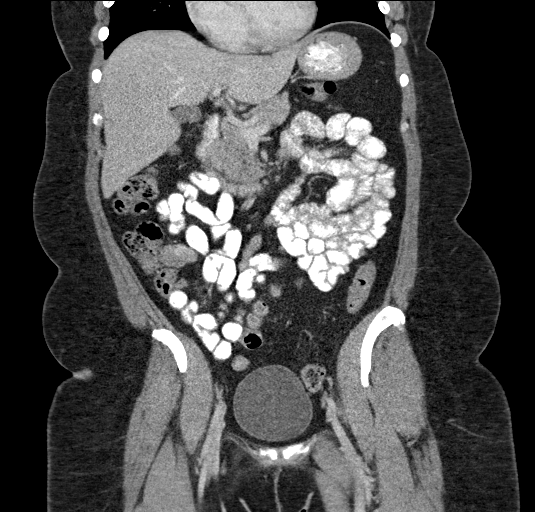
[im 67/121  soft-tissue]
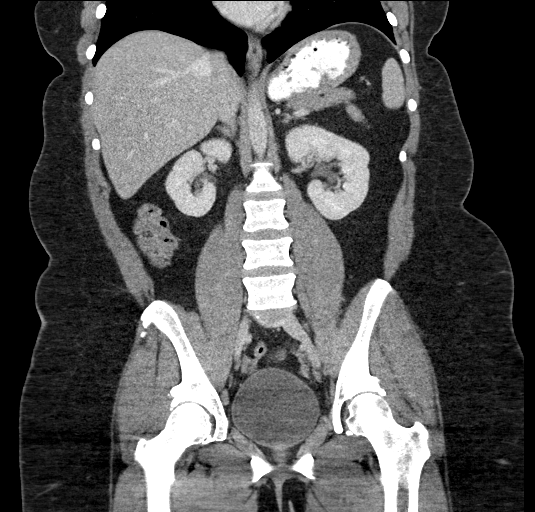

[16 of 46 positions shown; findings below may reference images not displayed]

FINDINGS: Lower chest: No acute abnormality.

Hepatobiliary: No focal liver abnormality is seen. No gallstones,
gallbladder wall thickening, or biliary dilatation.

Pancreas: Unremarkable. No pancreatic ductal dilatation or
surrounding inflammatory changes.

Spleen: Normal in size without focal abnormality.

Adrenals/Urinary Tract: The adrenal glands are normal. There is a
small mass in the left kidney seen on coronal image 63 measuring up
to 11 mm. There may be some mild associated enhancement seen on
coronal imaging. No other renal masses are identified. Ureters are
normal. No ureteral stones. The bladder is normal.

Stomach/Bowel: The stomach and small bowel are normal. The colon and
appendix are normal.

Vascular/Lymphatic: No significant vascular findings are present. No
enlarged abdominal or pelvic lymph nodes.

Reproductive: Uterus and bilateral adnexa are unremarkable.

Other: No abdominal wall hernia or abnormality. No abdominopelvic
ascites.

Musculoskeletal: No acute or significant osseous findings.
IMPRESSION: 1. There is a small mass in the left kidney measuring 11 mm which is
nonspecific. There may be some mild associated enhancement. It is
possible this could be a small solid mass versus a complicated
cystic mass with enhancing septations. Recommend an MRI for further
evaluation.
2. No other acute abnormalities identified to explain the patient's
acute symptoms.

## 2020-06-16 NOTE — Telephone Encounter (Signed)
Dana Washington is notifying PCP she is going to [employer] for insurance purposes. [place]. She has Tennis Elbow, seen them on Sunday 06/15/2020.

## 2020-06-17 IMAGING — MR MR ABDOMEN WO/W CM
9 of 18 series · 21 of 48 positions shown · IV contrast (gadavist)
Comparison: None.

CLINICAL DATA: History of renal mass, left renal mass

EXAM:
MRI ABDOMEN WITHOUT AND WITH CONTRAST
TECHNIQUE: Multiplanar multisequence MR imaging of the abdomen was performed
both before and after the administration of intravenous contrast.
CONTRAST:  10mL GADAVIST GADOBUTROL 1 MMOL/ML IV SOLN

[Series 3: T2 fat-sat · axial · 5.0mm · 0.78mm/px · z∈[-112,+133]mm · 2 of 50 slices shown]
[im 1/50]
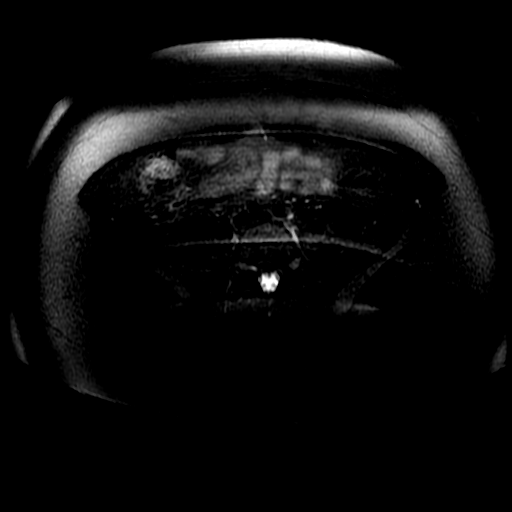
[im 50/50]
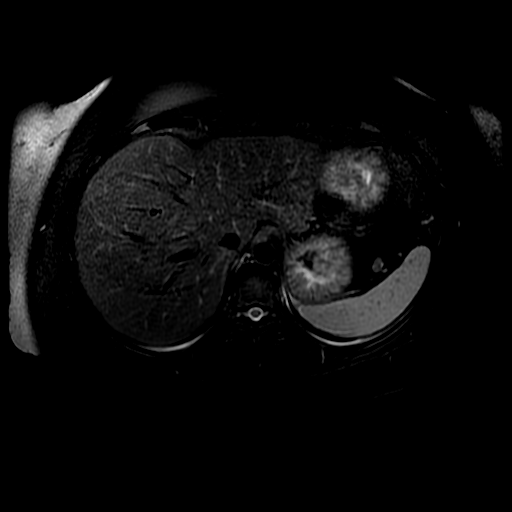

[Series 4: DWI b500 · axial · 6.0mm · 1.48mm/px · z∈[-129,+168]mm · 3 of 78 slices shown]
[im 1/78]
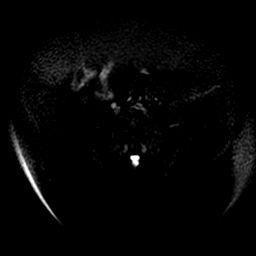
[im 39/78]
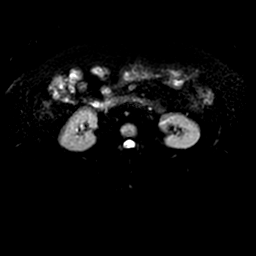
[im 78/78]
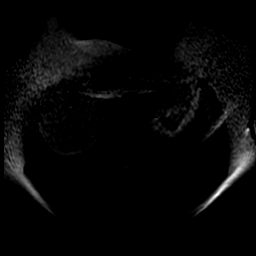

[Series 6: bSSFP · axial · 5.0mm · 0.78mm/px · z∈[-101,+159]mm · 2 of 53 slices shown]
[im 1/53]
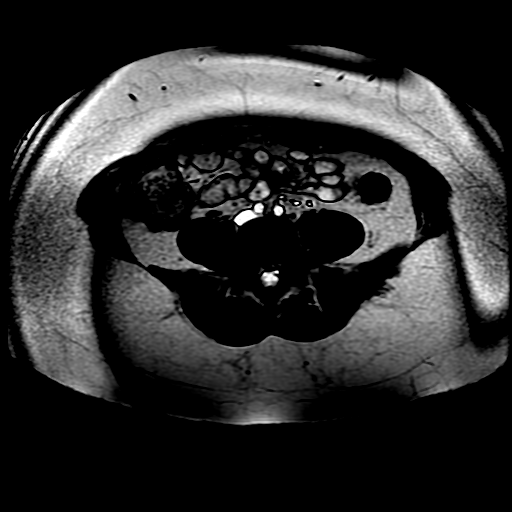
[im 53/53]
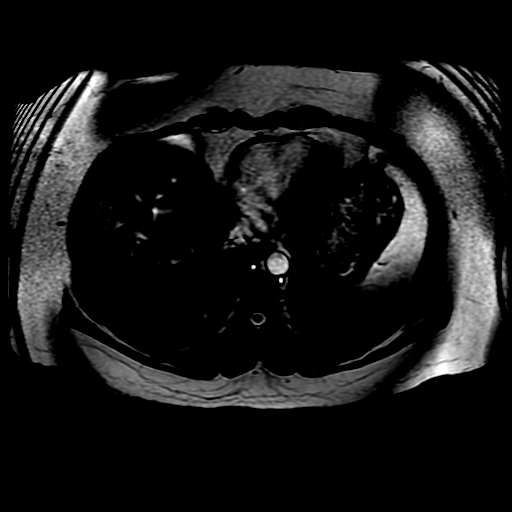

[Series 9: T2 · axial · 5.0mm · 0.78mm/px · z∈[-101,+159]mm · 2 of 53 slices shown (1 of 2)]
[im 1/53]
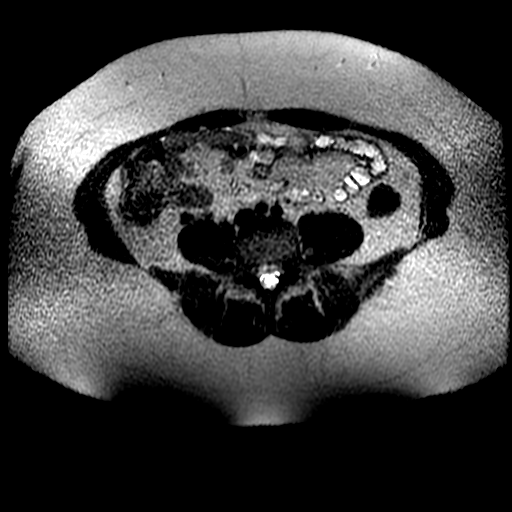
[im 53/53]
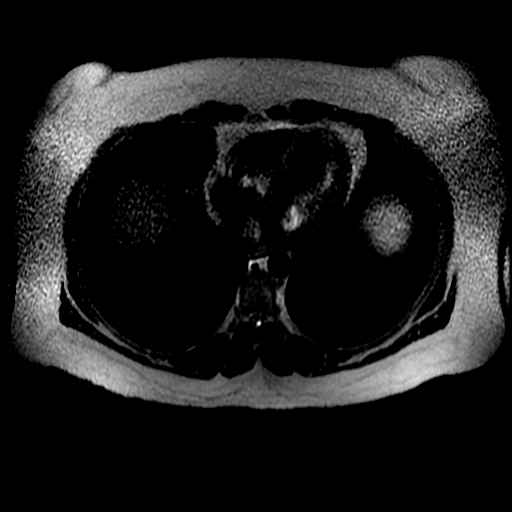

[Series 12: T2 · coronal · 5.0mm · 0.78mm/px · 1 of 35 slices shown (2 of 2)]
[im 1/35]
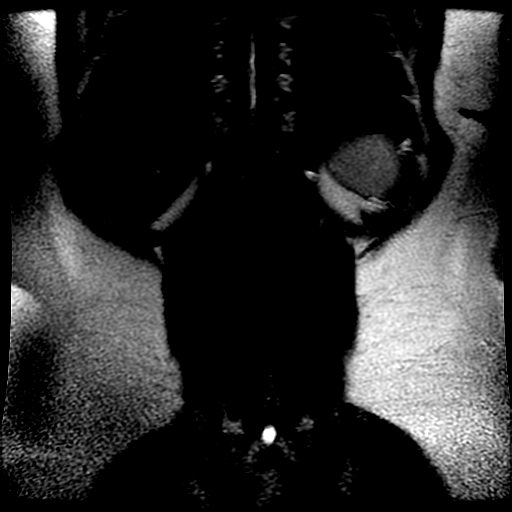

[Series 13: ax dualecho bh · axial · 5.0mm · 0.78mm/px · z∈[-101,+159]mm · 4 of 106 slices shown]
[im 1/106]
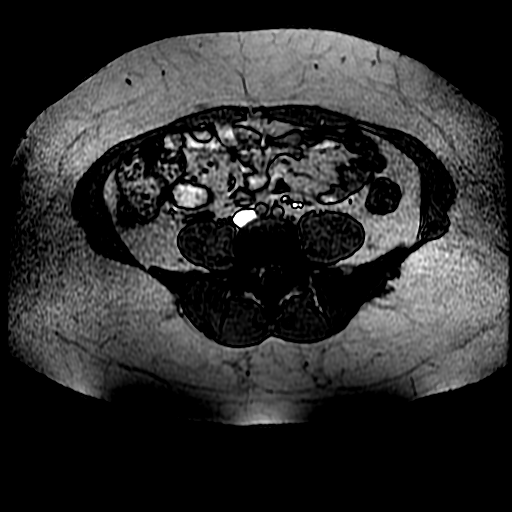
[im 36/106]
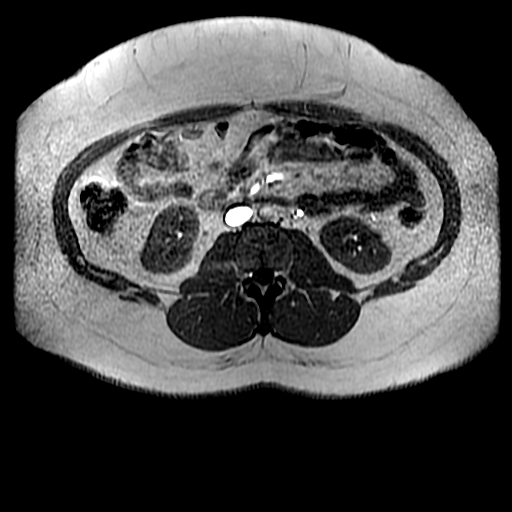
[im 71/106]
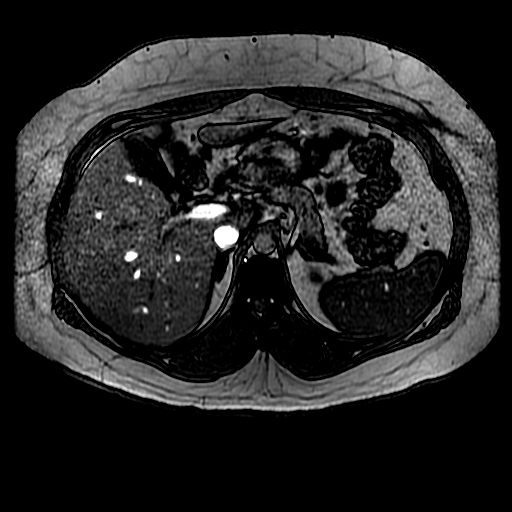
[im 106/106]
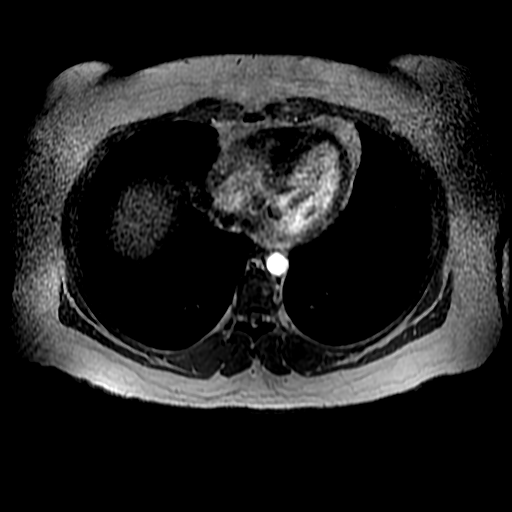

[Series 400: DWI · axial · 6.0mm · 1.48mm/px · 1 of 39 slices shown]
[im 1/39]
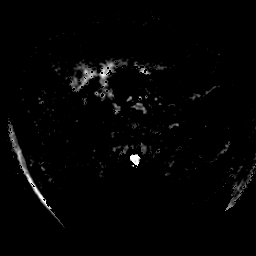

[Series 1400: T1 dynamic · axial · 5.0mm · 0.78mm/px · z∈[-93,+124]mm · 3 of 88 slices shown (1 of 2)]
[im 1/88]
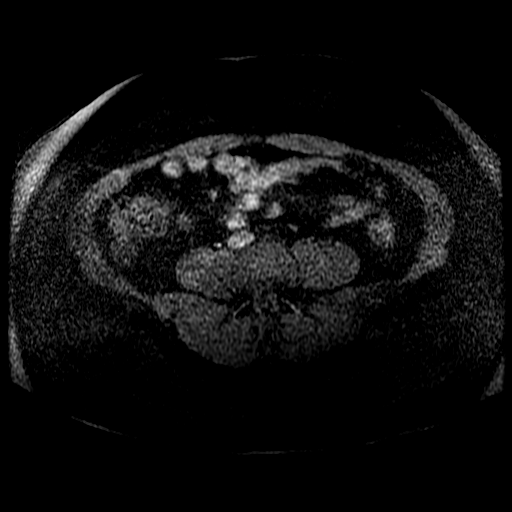
[im 44/88]
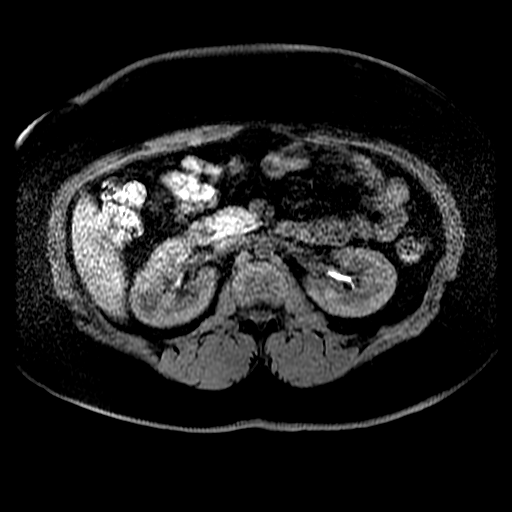
[im 88/88]
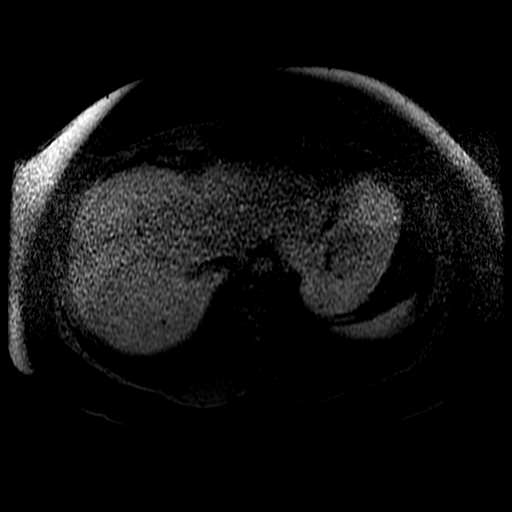

[Series 1401: T1 dynamic · axial · 5.0mm · 0.78mm/px · z∈[-93,+124]mm · 3 of 88 slices shown (2 of 2)]
[im 1/88]
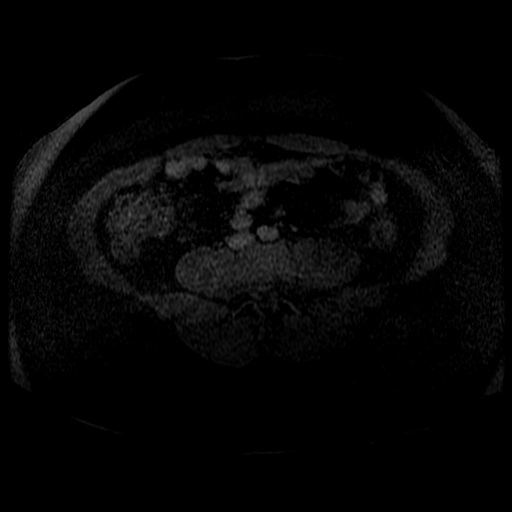
[im 44/88]
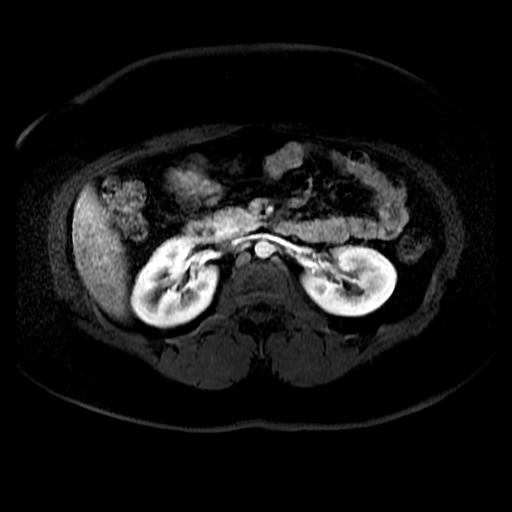
[im 88/88]
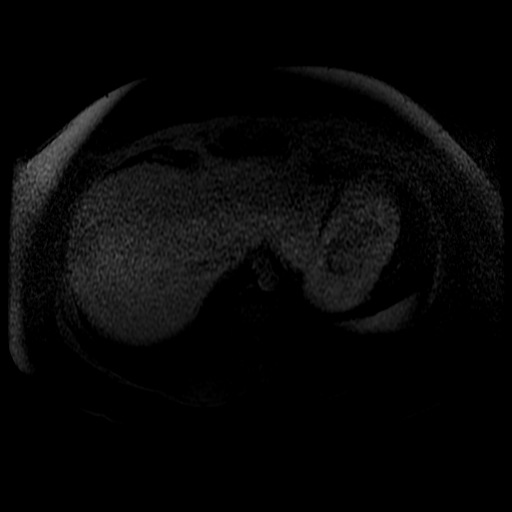

[21 of 48 positions shown; findings below may reference images not displayed]

FINDINGS: Lower chest: No signs of pleural effusion, limited assessment of the
chest on MR. No pericardial fluid.

Hepatobiliary: No focal hepatic lesion, liver is incompletely
imaged. No biliary ductal dilation. No signal difference on in and
opposed phase gradient echo imaging.

Pancreas:  Pancreas is unremarkable.

Spleen:  Imaged portions of the spleen are normal.

Adrenals/Urinary Tract: Bilateral adrenal glands without focal
abnormality and normal morphology.

Right kidney shows normal enhancement.

Tiny lesion in the lower pole of the right kidney which has
developed some areas of increased attenuation based on the CT
evaluation showing subtle areas of increased signal on the dependent
portion. Size is unchanged since 2984, lesion measuring 8 mm.
Dependent signal which may represent a small oblique septation is
not seen on precontrast implying that there is no hemorrhage which
would explain this finding within the lesion. There is no
hydronephrosis. This small cystic lesion is intrinsically bright on
T2 and dark on T1.

Stomach/Bowel: Bowel is normal to the extent visualized.

Vascular/Lymphatic: Vascular structures are patent. No signs of it
is adenopathy in the abdomen.

Other:  None.

Musculoskeletal: No suspicious bone lesions identified.
IMPRESSION: 8 mm cystic lesion not changed in size since 2984 but with
development of small area of potential enhancement, likely an
oblique septation, small size limiting assessment. Given equivocal
findings Bosniak 2F designation is assigned to this lesion.
Follow-up in 6 months with MRI or CT is suggested. These results
will be called to the ordering clinician or representative by the
Radiologist Assistant, and communication documented in the PACS or
[facility]Vision Dashboard.

## 2020-07-04 ENCOUNTER — Other Ambulatory Visit (HOSPITAL_COMMUNITY): Payer: Self-pay

## 2020-07-04 ENCOUNTER — Other Ambulatory Visit: Payer: Self-pay | Admitting: Internal Medicine

## 2020-07-04 MED FILL — Topiramate Tab 100 MG: ORAL | 90 days supply | Qty: 90 | Fill #0 | Status: AC

## 2020-07-05 ENCOUNTER — Other Ambulatory Visit (HOSPITAL_COMMUNITY): Payer: Self-pay

## 2020-07-05 MED ORDER — FLUOXETINE HCL 20 MG PO CAPS
ORAL_CAPSULE | Freq: Every day | ORAL | 3 refills | Status: DC
  Filled 2020-07-05: qty 90, 90d supply, fill #0

## 2020-07-05 MED ORDER — OMEPRAZOLE 20 MG PO CPDR
DELAYED_RELEASE_CAPSULE | Freq: Every day | ORAL | 1 refills | Status: DC
  Filled 2020-07-05: qty 90, 90d supply, fill #0
  Filled 2020-10-10: qty 90, 90d supply, fill #1

## 2020-07-18 ENCOUNTER — Other Ambulatory Visit: Payer: Self-pay

## 2020-07-18 ENCOUNTER — Other Ambulatory Visit: Payer: No Typology Code available for payment source | Admitting: Internal Medicine

## 2020-07-18 DIAGNOSIS — I10 Essential (primary) hypertension: Secondary | ICD-10-CM

## 2020-07-18 DIAGNOSIS — E7800 Pure hypercholesterolemia, unspecified: Secondary | ICD-10-CM

## 2020-07-18 DIAGNOSIS — E8881 Metabolic syndrome: Secondary | ICD-10-CM

## 2020-07-18 DIAGNOSIS — K5904 Chronic idiopathic constipation: Secondary | ICD-10-CM

## 2020-07-18 DIAGNOSIS — F419 Anxiety disorder, unspecified: Secondary | ICD-10-CM

## 2020-07-18 DIAGNOSIS — R7302 Impaired glucose tolerance (oral): Secondary | ICD-10-CM

## 2020-07-18 DIAGNOSIS — E039 Hypothyroidism, unspecified: Secondary | ICD-10-CM

## 2020-07-18 DIAGNOSIS — F32A Depression, unspecified: Secondary | ICD-10-CM

## 2020-07-18 DIAGNOSIS — Z0000 Encounter for general adult medical examination without abnormal findings: Secondary | ICD-10-CM

## 2020-07-19 LAB — LIPID PANEL
Cholesterol: 258 mg/dL — ABNORMAL HIGH (ref <200)
HDL: 55 mg/dL (ref >=50)
LDL Cholesterol (Calc): 169 mg/dL (calc) — ABNORMAL HIGH
Non-HDL Cholesterol (Calc): 203 mg/dL (calc) — ABNORMAL HIGH (ref <130)
Total CHOL/HDL Ratio: 4.7 (calc) (ref <5.0)
Triglycerides: 182 mg/dL — ABNORMAL HIGH (ref <150)

## 2020-07-19 LAB — TSH: TSH: 1.24 mIU/L

## 2020-07-19 LAB — CBC WITH DIFFERENTIAL/PLATELET
Absolute Monocytes: 442 cells/uL (ref 200–950)
Basophils Absolute: 53 cells/uL (ref 0–200)
Basophils Relative: 0.8 %
Eosinophils Absolute: 59 cells/uL (ref 15–500)
Eosinophils Relative: 0.9 %
HCT: 45 % (ref 35.0–45.0)
Hemoglobin: 14.7 g/dL (ref 11.7–15.5)
Lymphs Abs: 1300 cells/uL (ref 850–3900)
MCH: 28.9 pg (ref 27.0–33.0)
MCHC: 32.7 g/dL (ref 32.0–36.0)
MCV: 88.6 fL (ref 80.0–100.0)
MPV: 9 fL (ref 7.5–12.5)
Monocytes Relative: 6.7 %
Neutro Abs: 4745 cells/uL (ref 1500–7800)
Neutrophils Relative %: 71.9 %
Platelets: 362 10*3/uL (ref 140–400)
RBC: 5.08 10*6/uL (ref 3.80–5.10)
RDW: 14 % (ref 11.0–15.0)
Total Lymphocyte: 19.7 %
WBC: 6.6 10*3/uL (ref 3.8–10.8)

## 2020-07-19 LAB — COMPLETE METABOLIC PANEL WITH GFR
AG Ratio: 1.5 (calc) (ref 1.0–2.5)
ALT: 10 U/L (ref 6–29)
AST: 13 U/L (ref 10–35)
Albumin: 4.3 g/dL (ref 3.6–5.1)
Alkaline phosphatase (APISO): 84 U/L (ref 31–125)
BUN: 13 mg/dL (ref 7–25)
CO2: 25 mmol/L (ref 20–32)
Calcium: 9.6 mg/dL (ref 8.6–10.2)
Chloride: 103 mmol/L (ref 98–110)
Creat: 0.83 mg/dL (ref 0.50–1.10)
GFR, Est African American: 98 mL/min/{1.73_m2} (ref >=60)
GFR, Est Non African American: 85 mL/min/{1.73_m2} (ref >=60)
Globulin: 2.9 g/dL (calc) (ref 1.9–3.7)
Glucose, Bld: 92 mg/dL (ref 65–99)
Potassium: 4.5 mmol/L (ref 3.5–5.3)
Sodium: 139 mmol/L (ref 135–146)
Total Bilirubin: 0.3 mg/dL (ref 0.2–1.2)
Total Protein: 7.2 g/dL (ref 6.1–8.1)

## 2020-07-21 ENCOUNTER — Other Ambulatory Visit (HOSPITAL_COMMUNITY): Payer: Self-pay

## 2020-07-22 ENCOUNTER — Other Ambulatory Visit (HOSPITAL_COMMUNITY): Payer: Self-pay

## 2020-07-22 ENCOUNTER — Encounter: Payer: Self-pay | Admitting: Internal Medicine

## 2020-07-22 ENCOUNTER — Other Ambulatory Visit: Payer: Self-pay

## 2020-07-22 ENCOUNTER — Ambulatory Visit (INDEPENDENT_AMBULATORY_CARE_PROVIDER_SITE_OTHER): Payer: No Typology Code available for payment source | Admitting: Internal Medicine

## 2020-07-22 VITALS — BP 100/80 | HR 113 | Ht 65.0 in | Wt 276.0 lb

## 2020-07-22 DIAGNOSIS — H6690 Otitis media, unspecified, unspecified ear: Secondary | ICD-10-CM

## 2020-07-22 DIAGNOSIS — F411 Generalized anxiety disorder: Secondary | ICD-10-CM | POA: Diagnosis not present

## 2020-07-22 DIAGNOSIS — E8881 Metabolic syndrome: Secondary | ICD-10-CM | POA: Diagnosis not present

## 2020-07-22 DIAGNOSIS — Z8669 Personal history of other diseases of the nervous system and sense organs: Secondary | ICD-10-CM | POA: Diagnosis not present

## 2020-07-22 DIAGNOSIS — F439 Reaction to severe stress, unspecified: Secondary | ICD-10-CM

## 2020-07-22 DIAGNOSIS — Z9889 Other specified postprocedural states: Secondary | ICD-10-CM

## 2020-07-22 DIAGNOSIS — F419 Anxiety disorder, unspecified: Secondary | ICD-10-CM

## 2020-07-22 DIAGNOSIS — E7800 Pure hypercholesterolemia, unspecified: Secondary | ICD-10-CM

## 2020-07-22 DIAGNOSIS — Z8719 Personal history of other diseases of the digestive system: Secondary | ICD-10-CM

## 2020-07-22 DIAGNOSIS — Z0000 Encounter for general adult medical examination without abnormal findings: Secondary | ICD-10-CM

## 2020-07-22 DIAGNOSIS — I10 Essential (primary) hypertension: Secondary | ICD-10-CM

## 2020-07-22 DIAGNOSIS — E038 Other specified hypothyroidism: Secondary | ICD-10-CM

## 2020-07-22 DIAGNOSIS — F32A Depression, unspecified: Secondary | ICD-10-CM

## 2020-07-22 DIAGNOSIS — Z6841 Body Mass Index (BMI) 40.0 and over, adult: Secondary | ICD-10-CM

## 2020-07-22 DIAGNOSIS — R7302 Impaired glucose tolerance (oral): Secondary | ICD-10-CM

## 2020-07-22 DIAGNOSIS — E063 Autoimmune thyroiditis: Secondary | ICD-10-CM

## 2020-07-22 DIAGNOSIS — K219 Gastro-esophageal reflux disease without esophagitis: Secondary | ICD-10-CM

## 2020-07-22 LAB — POCT URINALYSIS DIPSTICK
Appearance: NEGATIVE
Bilirubin, UA: NEGATIVE
Blood, UA: NEGATIVE
Glucose, UA: NEGATIVE
Ketones, UA: NEGATIVE
Leukocytes, UA: NEGATIVE
Nitrite, UA: NEGATIVE
Odor: NEGATIVE
Protein, UA: NEGATIVE
Spec Grav, UA: 1.01 (ref 1.010–1.025)
Urobilinogen, UA: 0.2 E.U./dL
pH, UA: 6 (ref 5.0–8.0)

## 2020-07-22 IMAGING — MG DIGITAL SCREENING BILAT W/ TOMO
8 series · 8 of 24 positions shown · non-contrast
Comparison: Previous exam(s).

CLINICAL DATA: Screening.

EXAM:
DIGITAL SCREENING BILATERAL MAMMOGRAM WITH TOMO AND CAD

[R MLO synth-2D]
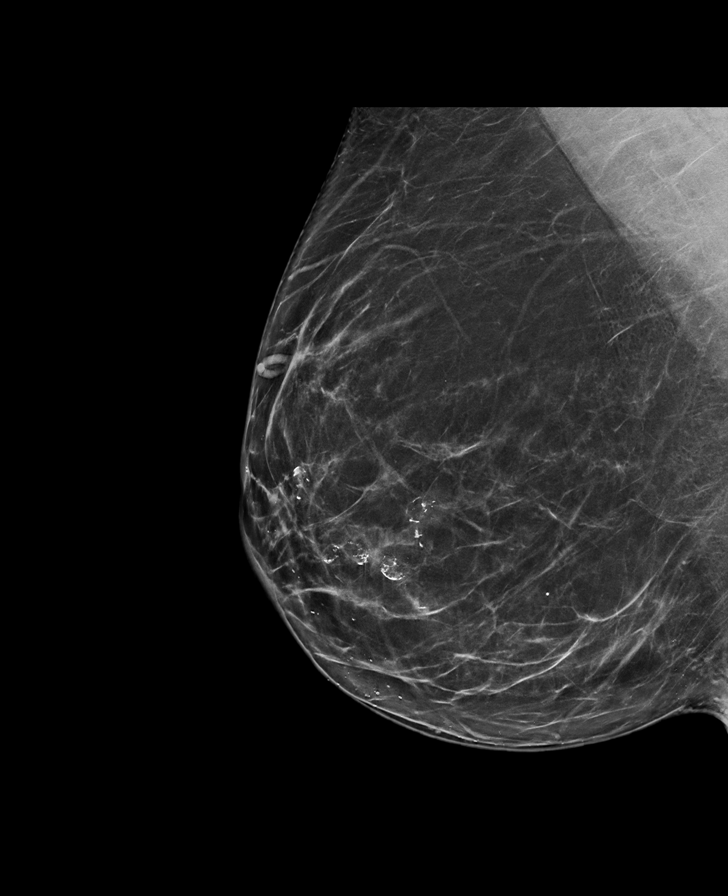

[R CC synth-2D]
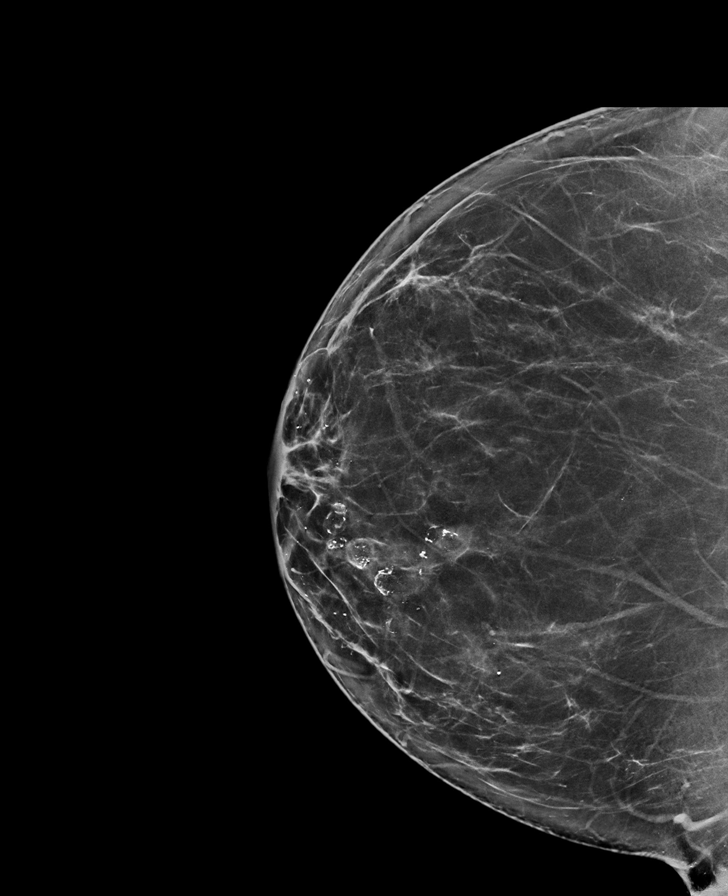

[L MLO synth-2D]
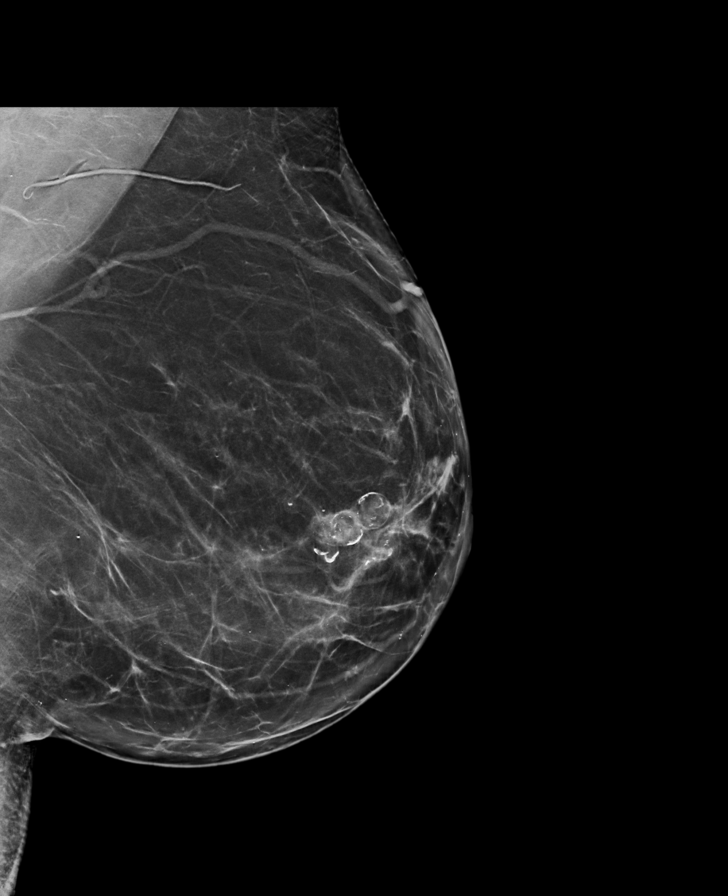

[L CC synth-2D]
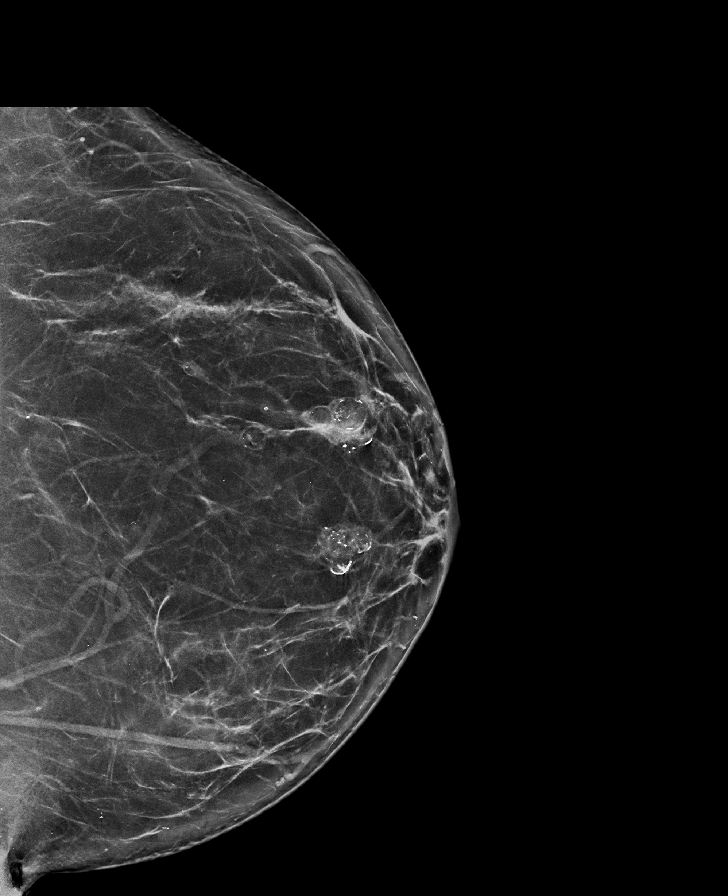

[R CC tomo · tomo slice 43/84.0]
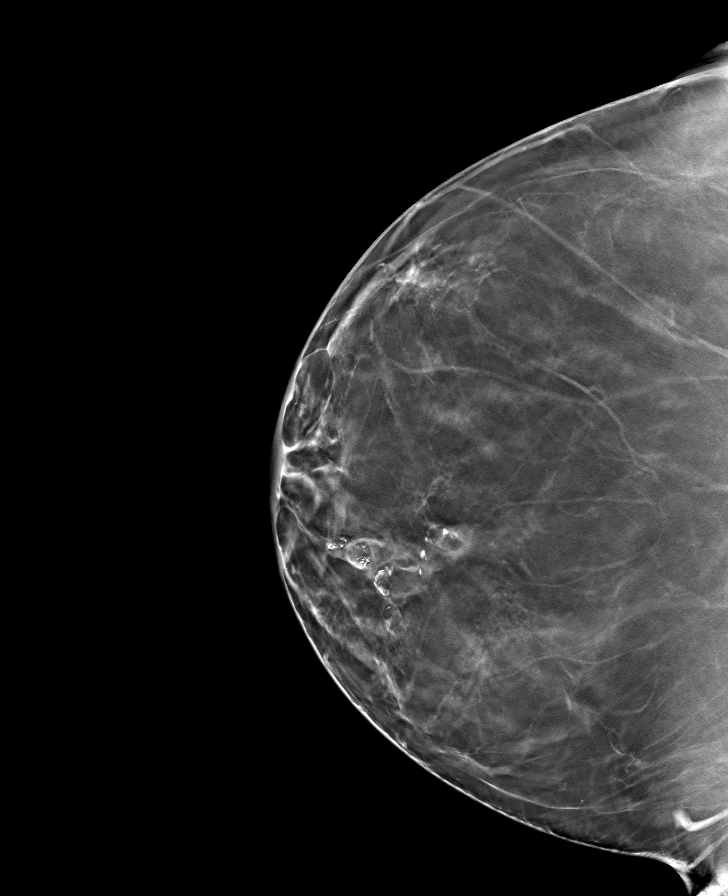

[L MLO tomo · tomo slice 46/91.0]
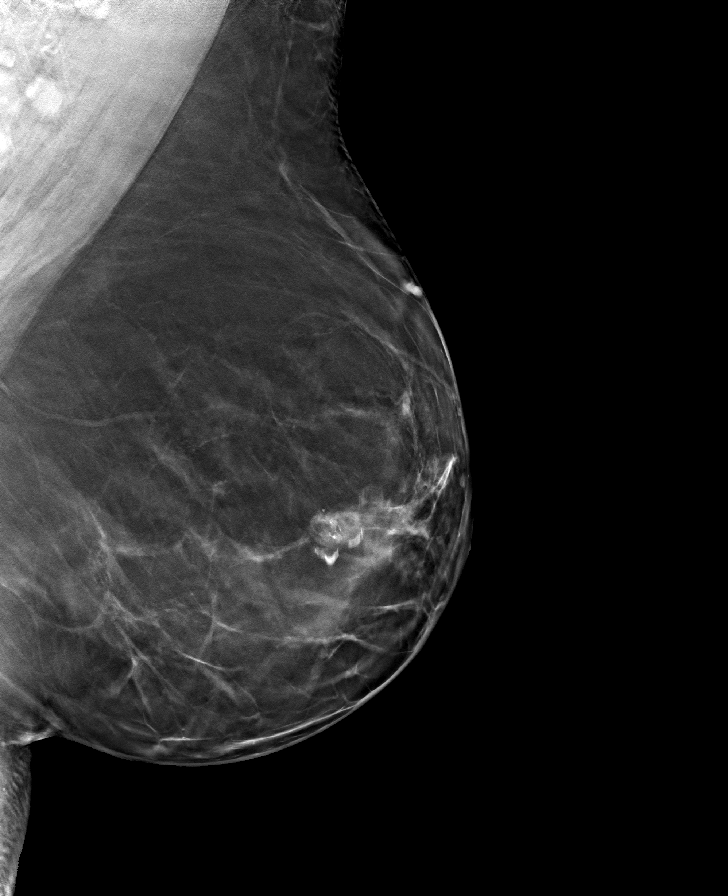

[L CC tomo · tomo slice 43/84.0]
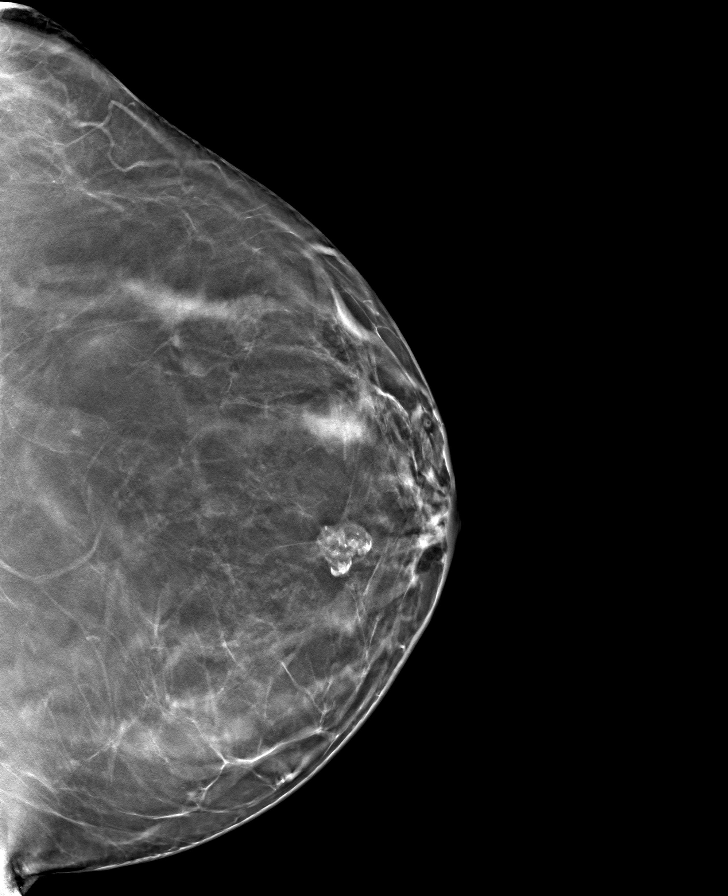

[R MLO tomo · tomo slice 45/89.0]
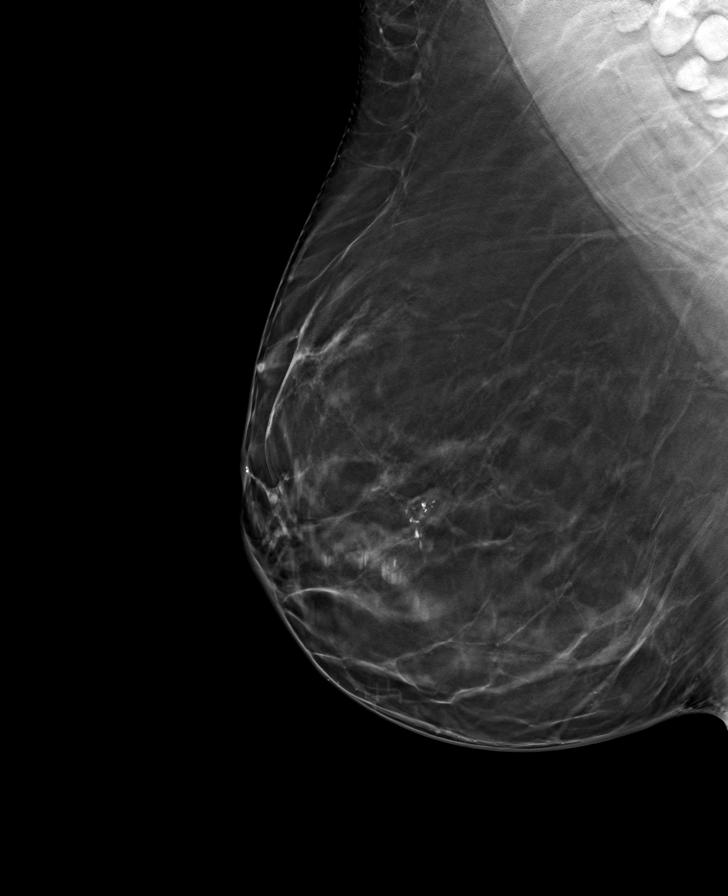

[8 of 24 positions shown; findings below may reference images not displayed]

ACR Breast Density Category b: There are scattered areas of
fibroglandular density.
FINDINGS: There are no findings suspicious for malignancy. Images were
processed with CAD.
IMPRESSION: No mammographic evidence of malignancy. A result letter of this
screening mammogram will be mailed directly to the patient.

RECOMMENDATION:
Screening mammogram in one year. (Code:[id number])

BI-RADS CATEGORY  1: Negative.

## 2020-07-22 MED ORDER — FLUOXETINE HCL 40 MG PO CAPS
40.0000 mg | ORAL_CAPSULE | Freq: Every day | ORAL | 3 refills | Status: DC
  Filled 2020-07-22: qty 90, 90d supply, fill #0
  Filled 2020-10-13: qty 90, 90d supply, fill #1
  Filled 2021-01-17: qty 90, 90d supply, fill #2
  Filled 2021-04-12: qty 90, 90d supply, fill #3

## 2020-07-22 MED ORDER — FLUCONAZOLE 150 MG PO TABS
150.0000 mg | ORAL_TABLET | Freq: Every day | ORAL | 1 refills | Status: DC
  Filled 2020-07-22: qty 1, 1d supply, fill #0

## 2020-07-22 MED ORDER — METHYLPREDNISOLONE ACETATE 80 MG/ML IJ SUSP
80.0000 mg | Freq: Once | INTRAMUSCULAR | Status: AC
  Administered 2020-07-22: 80 mg via INTRAMUSCULAR

## 2020-07-22 MED ORDER — AZITHROMYCIN 250 MG PO TABS
ORAL_TABLET | ORAL | 0 refills | Status: AC
  Filled 2020-07-22: qty 6, 5d supply, fill #0

## 2020-07-22 MED ORDER — ROSUVASTATIN CALCIUM 20 MG PO TABS
20.0000 mg | ORAL_TABLET | Freq: Every day | ORAL | 3 refills | Status: DC
  Filled 2020-07-22: qty 90, 90d supply, fill #0
  Filled 2020-10-13: qty 90, 90d supply, fill #1
  Filled 2021-01-17: qty 90, 90d supply, fill #2
  Filled 2021-04-12: qty 90, 90d supply, fill #3

## 2020-07-22 NOTE — Progress Notes (Signed)
Subjective:    Patient ID: Dana Washington, female    DOB: February 02, 1975, 45 y.o.   MRN: [id number]  HPI 45 year old Female see for health maintenance exam and evaluation of medical issues. Patient having work stress issues. Recommend counseling and [facility]. Info given to patient on weight clinic. Will increase Prozac to 40mg  daily. Has gained 26 pounds since December.   Has a history of essential hypertension, hyperlipidemia, obesity, metabolic syndrome, impaired glucose, migraine headaches and hypothyroidism.  History of functional constipation and has been seen by Dr. Carlean Purl.  She tried a statin medication but developed myalgias while taking Biaxin.  I think if she is not on Biaxin she would likely not have myalgias.  Impaired glucose tolerance, she takes metformin. History of anxiety treated with Xanax.  History of hypertension treated with benazepril HCTZ.  History of migraine headaches for which she takes as needed Fioricet.  Also takes Topamax for prophylaxis against migraine headaches.  Dr. Georgette Dover removed a lipoma from her left upper abdominal wall April 2019  GE reflux treated with Nexium.  Hypothyroidism treated with thyroid replacement medication.  She had breast reduction surgery May 2010.  She had bilateral carpal tunnel release February 2012.  LASEK surgery 2008 for myopia history of Hashimoto's thyroiditis with resultant hypothyroidism.  History of vitamin D deficiency.  Benign lymph node biopsy February 2010.  History of seroma left breast which was resulted from breast reduction surgery in 2010.  History of 20% rotator cuff tear and osteoarthritis AC joint treated surgically by Dr. Theda Sers in September 2014 with partial clavicle resection.  She also had a glenoid labral tear.  History of urticaria in February 2018 treated with steroids, Zyrtec and Zantac.    Review of Systems migraines are stable.  Anxiety and depression persist      Objective:   Physical Exam Blood pressure excellent at 100/80 pulse is 113 but she is anxious today pulse oximetry 97% weight 276 pounds BMI 45.93  Skin: Warm and dry.  Nodes none.  She has left serous otitis media neck supple without JVD thyromegaly or carotid bruits.  Chest is clear.  Breasts are without masses.  Cardiac exam: Regular rate and rhythm without ectopy.  Abdomen is soft nondistended without hepatosplenomegaly masses or tenderness.  No pitting edema of the lower extremities.  No focal deficits on brief neurological exam.  Affect thought and judgment are normal. GYN exam deferred to GYN physician      Assessment & Plan:   BMI 45.93-recommend [facility] or give consideration to laparoscopic gastric bypass surgery with Dr. Redmond Pulling.  Situational stress at work-discussed.  Needs counseling.  Continue Prozac and antianxiety medication  History of Hashimoto's thyroiditis with resultant hypothyroidism stable on thyroid replacement medication-currently Synthroid 100 mcg daily  History of vitamin D deficiency-continue vitamin D supplement  History of LASEK surgery  History of breast reduction surgery in 2010 with development of seroma left breast as a result of breast reduction surgery.  History of migraine headaches treated prophylactically with Topamax  History of shoulder surgery by Dr. Theda Sers in 2014  History of urticaria in 2018  GE reflux treated with PPI (Prilosec)  History of constipation-used to take Linzess but no longer does diet  Anxiety depression continue with Prozac and Xanax.  Counseling recommended.  Increase Prozac to 40 mg daily.  Hyperlipidemia to start with Crestor 20 mg daily.  Follow-up in 3 months.  Essential hypertension treated with benazepril HCTZ  Impaired  glucose tolerance-hemoglobin A1c 5.9%-does not want to be on metformin  Left serous otitis media-treated with Depo-Medrol IM and Zithromax Z-Pak.  Plan: Continue current  medications.  Consider counseling for situational stress.  Consider [facility] for laparoscopic bypass surgery with Dr. Greer Pickerel.  Follow-up here in 3 months for hyperlipidemia and other issues.

## 2020-07-22 NOTE — Patient Instructions (Addendum)
Start Crestor 20 mg daily. RTC in 3 months. Increase Prozac to 40 mg daily. Recommend [facility]. Depomedrol given IM for left otalgia. Also, take Zithromax Z pak for left otalgia.  Consider counseling for situational stress.

## 2020-07-28 ENCOUNTER — Other Ambulatory Visit (HOSPITAL_COMMUNITY): Payer: Self-pay

## 2020-07-28 MED ORDER — CARESTART COVID-19 HOME TEST VI KIT
PACK | 0 refills | Status: DC
Start: 2020-07-28 — End: 2020-08-27
  Filled 2020-07-28: qty 4, 4d supply, fill #0

## 2020-08-24 ENCOUNTER — Other Ambulatory Visit: Payer: Self-pay | Admitting: Internal Medicine

## 2020-08-24 MED ORDER — SYNTHROID 100 MCG PO TABS
100.0000 ug | ORAL_TABLET | Freq: Every day | ORAL | 0 refills | Status: DC
Start: 2020-08-24 — End: 2020-12-06
  Filled 2020-08-24: qty 90, 90d supply, fill #0

## 2020-08-24 MED ORDER — BENAZEPRIL-HYDROCHLOROTHIAZIDE 20-12.5 MG PO TABS
1.0000 | ORAL_TABLET | Freq: Every day | ORAL | 3 refills | Status: DC
  Filled 2020-08-24: qty 90, 90d supply, fill #0
  Filled 2020-11-22: qty 90, 90d supply, fill #1
  Filled 2021-02-19: qty 90, 90d supply, fill #2
  Filled 2021-05-26: qty 90, 90d supply, fill #3

## 2020-08-25 ENCOUNTER — Other Ambulatory Visit (HOSPITAL_COMMUNITY): Payer: Self-pay

## 2020-08-27 ENCOUNTER — Encounter (INDEPENDENT_AMBULATORY_CARE_PROVIDER_SITE_OTHER): Payer: Self-pay | Admitting: Bariatrics

## 2020-08-27 ENCOUNTER — Ambulatory Visit (INDEPENDENT_AMBULATORY_CARE_PROVIDER_SITE_OTHER): Payer: No Typology Code available for payment source | Admitting: Bariatrics

## 2020-08-27 ENCOUNTER — Other Ambulatory Visit: Payer: Self-pay

## 2020-08-27 VITALS — BP 116/81 | HR 91 | Temp 98.4°F | Ht 65.0 in | Wt 273.0 lb

## 2020-08-27 DIAGNOSIS — E559 Vitamin D deficiency, unspecified: Secondary | ICD-10-CM | POA: Diagnosis not present

## 2020-08-27 DIAGNOSIS — Z6841 Body Mass Index (BMI) 40.0 and over, adult: Secondary | ICD-10-CM

## 2020-08-27 DIAGNOSIS — Z9189 Other specified personal risk factors, not elsewhere classified: Secondary | ICD-10-CM

## 2020-08-27 DIAGNOSIS — E538 Deficiency of other specified B group vitamins: Secondary | ICD-10-CM

## 2020-08-27 DIAGNOSIS — R0602 Shortness of breath: Secondary | ICD-10-CM

## 2020-08-27 DIAGNOSIS — Z0289 Encounter for other administrative examinations: Secondary | ICD-10-CM

## 2020-08-27 DIAGNOSIS — R7302 Impaired glucose tolerance (oral): Secondary | ICD-10-CM | POA: Diagnosis not present

## 2020-08-27 DIAGNOSIS — E7800 Pure hypercholesterolemia, unspecified: Secondary | ICD-10-CM

## 2020-08-27 DIAGNOSIS — R5383 Other fatigue: Secondary | ICD-10-CM | POA: Diagnosis not present

## 2020-08-27 DIAGNOSIS — Z1331 Encounter for screening for depression: Secondary | ICD-10-CM

## 2020-08-27 DIAGNOSIS — E038 Other specified hypothyroidism: Secondary | ICD-10-CM

## 2020-08-27 DIAGNOSIS — I10 Essential (primary) hypertension: Secondary | ICD-10-CM | POA: Diagnosis not present

## 2020-08-28 ENCOUNTER — Encounter (INDEPENDENT_AMBULATORY_CARE_PROVIDER_SITE_OTHER): Payer: Self-pay | Admitting: Bariatrics

## 2020-08-28 DIAGNOSIS — R7303 Prediabetes: Secondary | ICD-10-CM | POA: Insufficient documentation

## 2020-08-28 LAB — INSULIN, RANDOM: INSULIN: 28 u[IU]/mL — ABNORMAL HIGH (ref 2.6–24.9)

## 2020-08-28 LAB — HEMOGLOBIN A1C
Est. average glucose Bld gHb Est-mCnc: 123 mg/dL
Hgb A1c MFr Bld: 5.9 % — ABNORMAL HIGH (ref 4.8–5.6)

## 2020-08-28 LAB — VITAMIN B12: Vitamin B-12: 341 pg/mL (ref 232–1245)

## 2020-08-28 LAB — VITAMIN D 25 HYDROXY (VIT D DEFICIENCY, FRACTURES): Vit D, 25-Hydroxy: 45.7 ng/mL (ref 30.0–100.0)

## 2020-08-28 NOTE — Progress Notes (Signed)
Dear Dr. Tedra Senegal,   Thank you for referring Dana Washington to our clinic. The following note includes my evaluation and treatment recommendations.  Chief Complaint:   OBESITY Dana Washington (MR# [id number]) is a 45 y.o. female who presents for evaluation and treatment of obesity and related comorbidities. Current BMI is Body mass index is 45.43 kg/m. Dana Washington has been struggling with her weight for many years and has been unsuccessful in either losing weight, maintaining weight loss, or reaching her healthy weight goal.  Dana Washington is currently in the action stage of change and ready to dedicate time achieving and maintaining a healthier weight. Dana Washington is interested in becoming our patient and working on intensive lifestyle modifications including (but not limited to) diet and exercise for weight loss.  Dana Washington does not like to cook. She considers herself to be a "picky eater".  Dana Washington's habits were reviewed today and are as follows: Her family eats meals together, she thinks her family will eat healthier with her, her desired weight loss is 93 lbs, she has been heavy most of her life, her heaviest weight ever was 274 pounds, she is a picky eater and doesn't like to eat healthier foods, she has significant food cravings issues, she snacks frequently in the evenings, she skips meals frequently, she is frequently drinking liquids with calories, and she struggles with emotional eating.  Depression Screen Dana Washington's Food and Mood (modified PHQ-9) score was 3.  Depression screen PHQ 2/9 08/27/2020  Decreased Interest 1  Down, Depressed, Hopeless 0  PHQ - 2 Score 1  Altered sleeping 0  Tired, decreased energy 1  Change in appetite 1  Feeling bad or failure about yourself  0  Trouble concentrating 0  Moving slowly or fidgety/restless 0  Suicidal thoughts 0  PHQ-9 Score 3  Difficult doing work/chores Not difficult at all   Subjective:   1. Other fatigue Dana Washington admits  to daytime somnolence and admits to waking up still tired. Patent has a history of symptoms of daytime fatigue and morning fatigue. Dana Washington generally gets  4-8  hours of sleep per night, and states that she has poor sleep quality. Snoring is present. Apneic episodes are not present. Epworth Sleepiness Score is 1.  2. SOB (shortness of breath) on exertion Dana Washington notes increasing shortness of breath with exercising and seems to be worsening over time with weight gain. She notes getting out of breath sooner with activity than she used to. This has gotten worse recently. Dana Washington denies shortness of breath at rest or orthopnea.  3. Essential hypertension BP controlled. Dana Washington takes benazepril-HCTZ.  BP Readings from Last 3 Encounters:  08/27/20 116/81  07/22/20 100/80  01/08/20 110/80   Lab Results  Component Value Date   CREATININE 0.83 07/18/2020   CREATININE 0.88 06/19/2019   CREATININE 0.90 03/26/2019   4. Impaired glucose tolerance PCP said pt is "borderline". She has been on Metformin in the past.  5. Other specified hypothyroidism She is taking Synthroid.  6. Pure hypercholesterolemia Dana Washington is taking rosuvastatin 20 mg.  7. Vitamin D deficiency Pt is not on a Vit D supplement.  8. Vitamin B12 deficiency She is not taking a B12 supplement.  8. At risk for activity intolerance Dana Washington is at risk for exercise intolerance due to obesity and weather.  Assessment/Plan:   1. Other fatigue Dana Washington does feel that her weight is causing her energy to be lower than it should be. Fatigue may be related to obesity, depression or  many other causes. Labs will be ordered, and in the meanwhile, Dana Washington will focus on self care including making healthy food choices, increasing physical activity and focusing on stress reduction. Check labs today.  - EKG 12-Lead - Vitamin B12 - Hemoglobin A1c - Insulin, random - VITAMIN D 25 Hydroxy (Vit-D Deficiency, Fractures)  2. SOB (shortness of  breath) on exertion Dana Washington does feel that she gets out of breath more easily that she used to when she exercises. Dana Washington's shortness of breath appears to be obesity related and exercise induced. She has agreed to work on weight loss and gradually increase exercise to treat her exercise induced shortness of breath. Will continue to monitor closely.  3. Essential hypertension Dana Washington is working on healthy weight loss and exercise to improve blood pressure control. We will watch for signs of hypotension as she continues her lifestyle modifications. Continue current treatment plan.  4. Impaired glucose tolerance Fasting labs will be obtained and results with be discussed with Dana Washington in 2 weeks at her follow up visit. In the meanwhile Dana Washington was started on a lower simple carbohydrate diet and will work on weight loss efforts.  - Hemoglobin A1c - Insulin, random  5. Other specified hypothyroidism Continue Synthroid as directed. Patient with long-standing hypothyroidism, on levothyroxine therapy. She appears euthyroid. Orders and follow up as documented in patient record.  Counseling Good thyroid control is important for overall health. Supratherapeutic thyroid levels are dangerous and will not improve weight loss results. The correct way to take levothyroxine is fasting, with water, separated by at least 30 minutes from breakfast, and separated by more than 4 hours from calcium, iron, multivitamins, acid reflux medications (PPIs).   6. Pure hypercholesterolemia Cardiovascular risk and specific lipid/LDL goals reviewed.  We discussed several lifestyle modifications today and Dana Washington will continue to work on diet, exercise and weight loss efforts. Orders and follow up as documented in patient record. Continue current treatment plan.  Counseling Intensive lifestyle modifications are the first line treatment for this issue. Dietary changes: Increase soluble fiber. Decrease simple  carbohydrates. Exercise changes: Moderate to vigorous-intensity aerobic activity 150 minutes per week if tolerated. Lipid-lowering medications: see documented in medical record.  7. Vitamin D deficiency Low Vitamin D level contributes to fatigue and are associated with obesity, breast, and colon cancer. She agrees to follow-up for routine testing of Vitamin D, at least 2-3 times per year to avoid over-replacement. Check labs today.  - VITAMIN D 25 Hydroxy (Vit-D Deficiency, Fractures)  8. Vitamin B12 deficiency The diagnosis was reviewed with the patient. Counseling provided today, see below. We will continue to monitor. Orders and follow up as documented in patient record.  Counseling The body needs vitamin B12: to make red blood cells; to make DNA; and to help the nerves work properly so they can carry messages from the brain to the body.  The main causes of vitamin B12 deficiency include dietary deficiency, digestive diseases, pernicious anemia, and having a surgery in which part of the stomach or small intestine is removed.  Certain medicines can make it harder for the body to absorb vitamin B12. These medicines include: heartburn medications; some antibiotics; some medications used to treat diabetes, gout, and high cholesterol.  In some cases, there are no symptoms of this condition. If the condition leads to anemia or nerve damage, various symptoms can occur, such as weakness or fatigue, shortness of breath, and numbness or tingling in your hands and feet.   Treatment:  May include taking vitamin  B12 supplements.  Avoid alcohol.  Eat lots of healthy foods that contain vitamin B12: Beef, pork, chicken, [place], and organ meats, such as liver.  Seafood: This includes clams, rainbow trout, salmon, tuna, and haddock. Eggs.  Cereal and dairy products that are fortified: This means that vitamin B12 has been added to the food.  Check labs today.  - Vitamin B12  9. Depression  screen Dana Washington had a negative depression screening. Depression is commonly associated with obesity and often results in emotional eating behaviors. We will monitor this closely and work on CBT to help improve the non-hunger eating patterns. Referral to Psychology may be required if no improvement is seen as she continues in our clinic.  10. At risk for activity intolerance Dana Washington was given approximately 15 minutes of exercise intolerance counseling today. She is 45 y.o. female and has risk factors exercise intolerance including obesity. We discussed intensive lifestyle modifications today with an emphasis on specific weight loss instructions and strategies. Dana Washington will slowly increase activity as tolerated.  Repetitive spaced learning was employed today to elicit superior memory formation and behavioral change.   11. Class 3 severe obesity with serious comorbidity and body mass index (BMI) of 45.0 to 49.9 in adult, unspecified obesity type (HCC)  Dana Washington is currently in the action stage of change and her goal is to continue with weight loss efforts. I recommend Dana Washington begin the structured treatment plan as follows:  She has agreed to the Category 3 Plan.  Meal planning 07/18/2020 labs reviewed. Decrease portion sizes.  Exercise goals:  As is    Behavioral modification strategies: increasing lean protein intake, decreasing simple carbohydrates, increasing vegetables, increasing water intake, decreasing eating out, no skipping meals, meal planning and cooking strategies, keeping healthy foods in the home, and planning for success.  She was informed of the importance of frequent follow-up visits to maximize her success with intensive lifestyle modifications for her multiple health conditions. She was informed we would discuss her lab results at her next visit unless there is a critical issue that needs to be addressed sooner. Dana Washington agreed to keep her next visit at the agreed upon time to  discuss these results.  Objective:   Blood pressure 116/81, pulse 91, temperature 98.4 F (36.9 C), height '5\' 5"'$  (1.651 m), weight 273 lb (123.8 kg), last menstrual period 08/20/2020, SpO2 97 %. Body mass index is 45.43 kg/m.  EKG: Normal sinus rhythm, rate 96.  Indirect Calorimeter completed today shows a VO2 of 290 and a REE of 2002.  Her calculated basal metabolic rate is [id number] thus her basal metabolic rate is worse than expected.  General: Cooperative, alert, well developed, in no acute distress. HEENT: Conjunctivae and lids unremarkable. Cardiovascular: Regular rhythm.  Lungs: Normal work of breathing. Neurologic: No focal deficits.   Lab Results  Component Value Date   CREATININE 0.83 07/18/2020   BUN 13 07/18/2020   NA 139 07/18/2020   K 4.5 07/18/2020   CL 103 07/18/2020   CO2 25 07/18/2020   Lab Results  Component Value Date   ALT 10 07/18/2020   AST 13 07/18/2020   ALKPHOS 75 03/29/2016   BILITOT 0.3 07/18/2020   Lab Results  Component Value Date   HGBA1C 5.9 (H) 08/27/2020   HGBA1C 5.5 06/19/2019   HGBA1C 5.6 04/10/2018   HGBA1C 5.4 04/01/2017   HGBA1C 5.3 09/27/2016   Lab Results  Component Value Date   INSULIN 28.0 (H) 08/27/2020   Lab Results  Component Value Date  TSH 1.24 07/18/2020   Lab Results  Component Value Date   CHOL 258 (H) 07/18/2020   HDL 55 07/18/2020   LDLCALC 169 (H) 07/18/2020   TRIG 182 (H) 07/18/2020   CHOLHDL 4.7 07/18/2020   Lab Results  Component Value Date   WBC 6.6 07/18/2020   HGB 14.7 07/18/2020   HCT 45.0 07/18/2020   MCV 88.6 07/18/2020   PLT 362 07/18/2020    Attestation Statements:   Reviewed by clinician on day of visit: allergies, medications, problem list, medical history, surgical history, family history, social history, and previous encounter notes.  Coral Ceo, CMA, am acting as transcriptionist for Coralie Common, MD.   I have reviewed the above documentation for accuracy and  completeness, and I agree with the above. Jearld Lesch, DO

## 2020-09-01 ENCOUNTER — Encounter (INDEPENDENT_AMBULATORY_CARE_PROVIDER_SITE_OTHER): Payer: Self-pay | Admitting: Bariatrics

## 2020-09-04 ENCOUNTER — Emergency Department (HOSPITAL_BASED_OUTPATIENT_CLINIC_OR_DEPARTMENT_OTHER)
Admission: EM | Admit: 2020-09-04 | Discharge: 2020-09-04 | Disposition: A | Discharge status: Home / Self Care | Payer: No Typology Code available for payment source | Attending: Emergency Medicine | Admitting: Emergency Medicine

## 2020-09-04 ENCOUNTER — Other Ambulatory Visit: Payer: Self-pay

## 2020-09-04 ENCOUNTER — Encounter (HOSPITAL_BASED_OUTPATIENT_CLINIC_OR_DEPARTMENT_OTHER): Payer: Self-pay

## 2020-09-04 ENCOUNTER — Emergency Department (HOSPITAL_BASED_OUTPATIENT_CLINIC_OR_DEPARTMENT_OTHER): Payer: No Typology Code available for payment source

## 2020-09-04 DIAGNOSIS — E876 Hypokalemia: Secondary | ICD-10-CM | POA: Insufficient documentation

## 2020-09-04 DIAGNOSIS — E1169 Type 2 diabetes mellitus with other specified complication: Secondary | ICD-10-CM | POA: Diagnosis not present

## 2020-09-04 DIAGNOSIS — K5900 Constipation, unspecified: Secondary | ICD-10-CM | POA: Diagnosis not present

## 2020-09-04 DIAGNOSIS — S3992XA Unspecified injury of lower back, initial encounter: Secondary | ICD-10-CM | POA: Diagnosis present

## 2020-09-04 DIAGNOSIS — Z79899 Other long term (current) drug therapy: Secondary | ICD-10-CM | POA: Diagnosis not present

## 2020-09-04 DIAGNOSIS — E785 Hyperlipidemia, unspecified: Secondary | ICD-10-CM | POA: Insufficient documentation

## 2020-09-04 DIAGNOSIS — E038 Other specified hypothyroidism: Secondary | ICD-10-CM | POA: Diagnosis not present

## 2020-09-04 DIAGNOSIS — S301XXA Contusion of abdominal wall, initial encounter: Secondary | ICD-10-CM | POA: Diagnosis not present

## 2020-09-04 DIAGNOSIS — Z711 Person with feared health complaint in whom no diagnosis is made: Secondary | ICD-10-CM

## 2020-09-04 DIAGNOSIS — R519 Headache, unspecified: Secondary | ICD-10-CM | POA: Diagnosis not present

## 2020-09-04 DIAGNOSIS — X58XXXA Exposure to other specified factors, initial encounter: Secondary | ICD-10-CM | POA: Diagnosis not present

## 2020-09-04 DIAGNOSIS — N9489 Other specified conditions associated with female genital organs and menstrual cycle: Secondary | ICD-10-CM | POA: Insufficient documentation

## 2020-09-04 LAB — CBC WITH DIFFERENTIAL/PLATELET
Abs Immature Granulocytes: 0.06 10*3/uL (ref 0.00–0.07)
Basophils Absolute: 0 10*3/uL (ref 0.0–0.1)
Basophils Relative: 1 %
Eosinophils Absolute: 0.1 10*3/uL (ref 0.0–0.5)
Eosinophils Relative: 1 %
HCT: 42.1 % (ref 36.0–46.0)
Hemoglobin: 13.9 g/dL (ref 12.0–15.0)
Immature Granulocytes: 1 %
Lymphocytes Relative: 22 %
Lymphs Abs: 1.9 10*3/uL (ref 0.7–4.0)
MCH: 28.8 pg (ref 26.0–34.0)
MCHC: 33 g/dL (ref 30.0–36.0)
MCV: 87.2 fL (ref 80.0–100.0)
Monocytes Absolute: 0.7 10*3/uL (ref 0.1–1.0)
Monocytes Relative: 9 %
Neutro Abs: 5.9 10*3/uL (ref 1.7–7.7)
Neutrophils Relative %: 66 %
Platelets: 355 10*3/uL (ref 150–400)
RBC: 4.83 MIL/uL (ref 3.87–5.11)
RDW: 14.4 % (ref 11.5–15.5)
WBC: 8.7 10*3/uL (ref 4.0–10.5)
nRBC: 0 % (ref 0.0–0.2)

## 2020-09-04 LAB — COMPREHENSIVE METABOLIC PANEL
ALT: 16 U/L (ref 0–44)
AST: 18 U/L (ref 15–41)
Albumin: 4.1 g/dL (ref 3.5–5.0)
Alkaline Phosphatase: 56 U/L (ref 38–126)
Anion gap: 10 (ref 5–15)
BUN: 19 mg/dL (ref 6–20)
CO2: 24 mmol/L (ref 22–32)
Calcium: 9.3 mg/dL (ref 8.9–10.3)
Chloride: 104 mmol/L (ref 98–111)
Creatinine, Ser: 1 mg/dL (ref 0.44–1.00)
GFR, Estimated: >60 mL/min (ref >60)
Glucose, Bld: 98 mg/dL (ref 70–99)
Potassium: 3.2 mmol/L — ABNORMAL LOW (ref 3.5–5.1)
Sodium: 138 mmol/L (ref 135–145)
Total Bilirubin: 0.4 mg/dL (ref 0.3–1.2)
Total Protein: 7.1 g/dL (ref 6.5–8.1)

## 2020-09-04 LAB — LIPASE, BLOOD: Lipase: 23 U/L (ref 11–51)

## 2020-09-04 LAB — HCG, SERUM, QUALITATIVE: Preg, Serum: NEGATIVE

## 2020-09-04 MED ORDER — POTASSIUM CHLORIDE CRYS ER 20 MEQ PO TBCR
40.0000 meq | EXTENDED_RELEASE_TABLET | Freq: Once | ORAL | Status: AC
  Administered 2020-09-04: 40 meq via ORAL
  Filled 2020-09-04: qty 2

## 2020-09-04 NOTE — ED Provider Notes (Signed)
[facility] Provider Note: Dana Spurling, MD, FACEP  CSN: [id number] MRN: [id number] ARRIVAL: 09/04/20 at [place]  Abdominal Pain   HISTORY OF PRESENT ILLNESS  09/04/20 1:32 AM Dana Washington is a 45 y.o. female who started a high-protein low carbohydrate diet on 08/27/2020.  She has subsequently developed constipation (last bowel movement about a week ago) and intermittent sharp pain in her lower and left side abdomen.  The pain is not severe.  She is also had some nausea and headache with this.  She states the symptoms were described to her as typical of the ketosis that goes along with this particular diet.  This morning, just prior to arrival, she noticed a small bruise on her abdominal wall in her right lower quadrant along with a few petechiae of her umbilicus.  She does not recall any trauma and the site is not painful.  She also noticed some petechiae of her umbilicus.  She Googled the symptoms and was led to the conclusion that she has Cullen's sign and Grey Turner sign indicative of hemorrhagic pancreatitis.  She became concerned because it has a 30% mortality rate.  She is having no epigastric pain.  She is having no vomiting.   Past Medical History:  Diagnosis Date   Allergy    Anxiety    Constipation    Diabetes mellitus without complication (HCC)    Fundic gland polyps of stomach, benign    GERD (gastroesophageal reflux disease)    Glucose intolerance (impaired glucose tolerance)    Herpes dermatitis    History of motion sickness    Hx of Hashimoto thyroiditis    Hyperlipidemia    Hypertension    Hypothyroidism, secondary    Impingement syndrome of right shoulder    Insomnia    Metabolic syndrome    Migraine    OA (osteoarthritis)    RIGHT SHOULDER AC JOINT   Obese    OCD (obsessive compulsive disorder)    PONV (postoperative nausea and vomiting)    SEVERE after breast reduction   TMJ (dislocation of  temporomandibular joint)    Vitamin D deficiency    Vitamin D deficiency     Past Surgical History:  Procedure Laterality Date   biopsy of lymph node  FEB 2010   BENIGN   BREAST BIOPSY Bilateral    BREAST EXCISIONAL BIOPSY Left    benign   BREAST REDUCTION SURGERY Bilateral MAY 2010   CARPAL TUNNEL RELEASE Bilateral 2012   CHONDROPLASTY Left 10/25/2017   Procedure: CHONDROPLASTY;  Surgeon: Sydnee Cabal, MD;  Location: [facility];  Service: Orthopedics;  Laterality: Left;   EXCISION MASS ABDOMINAL N/A 04/14/2017   Procedure: EXCISION OF SUBCUTANEOUS LIPOMA OF LEFT UPPER ABDOMINAL WALL;  Surgeon: Donnie Mesa, MD;  Location: [place];  Service: General;  Laterality: N/A;   KNEE ARTHROSCOPY WITH MEDIAL MENISECTOMY Left 10/25/2017   Procedure: LEFT KNEE ARTHROSCOPY WITH MEDIAL AND LATERAL MENISECTOMY;  Surgeon: Sydnee Cabal, MD;  Location: [facility];  Service: Orthopedics;  Laterality: Left;   LASIK  2008   REDUCTION MAMMAPLASTY Bilateral    SHOULDER ARTHROSCOPY WITH ROTATOR CUFF REPAIR AND SUBACROMIAL DECOMPRESSION Right 09/19/2012   Procedure: RIGHT SHOULDER EXAMINE UNDER ANESTHESIA, ARTHROSCOPY,  DEBRIDEMENT, SUBACROMIAL DECOMPRESSION, DISTAL CLAVICLE RESECTION AND  ROTATOR CUFF REPAIR, LABRAL REPAIR ;  Surgeon: Sydnee Cabal, MD;  Location: [place];  Service: Orthopedics;  Laterality: Right;   WISDOM TOOTH EXTRACTION  AGE 52    Family History  Problem Relation Age of Onset   High Cholesterol Mother    Obesity Mother    Thyroid disease Mother    High Cholesterol Father    Cancer Father    Obesity Father    Heart disease Maternal Grandfather    Colon cancer Maternal Grandfather        ds in his late 19's   Heart disease Paternal Grandmother    Esophageal cancer Other        mat great aunt, non-smoker   Rectal cancer Neg Hx    Stomach cancer Neg Hx    Colon polyps Neg Hx     Social History   Tobacco  Use   Smoking status: Never   Smokeless tobacco: Never  Vaping Use   Vaping Use: Never used  Substance Use Topics   Alcohol use: Yes    Comment: social   Drug use: No    Prior to Admission medications   Medication Sig Start Date End Date Taking? Authorizing Provider  benazepril-hydrochlorthiazide (LOTENSIN HCT) 20-12.5 MG tablet TAKE 1 TABLET BY MOUTH ONCE DAILY 08/24/20 08/24/21  Elby Showers, MD  FLUoxetine (PROZAC) 40 MG capsule Take 1 capsule (40 mg total) by mouth daily. 07/22/20   Elby Showers, MD  LARIN FE 1.5/30 1.5-30 MG-MCG tablet TAKE 1 TABLET BY MOUTH ONCE DAILY 03/12/14   Elby Showers, MD  omeprazole (PRILOSEC) 20 MG capsule TAKE 1 CAPSULE BY MOUTH ONCE DAILY 07/05/20 07/05/21  Elby Showers, MD  rosuvastatin (CRESTOR) 20 MG tablet Take 1 tablet (20 mg total) by mouth daily. 07/22/20   Elby Showers, MD  SYNTHROID 100 MCG tablet TAKE 1 TABLET BY MOUTH ONCE DAILY 08/24/20 08/24/21  Elby Showers, MD  topiramate (TOPAMAX) 100 MG tablet TAKE 1 TABLET BY MOUTH EVERY EVENING AT BEDTIME 01/03/20 01/02/21  Elby Showers, MD    Allergies Bee venom   REVIEW OF SYSTEMS  Negative except as noted here or in the History of Present Illness.   PHYSICAL EXAMINATION  Initial Vital Signs Blood pressure (!) 168/102, pulse 87, temperature 98 F (36.7 C), last menstrual period 08/20/2020, SpO2 96 %.  Examination General: Well-developed, well-nourished female in no acute distress; appearance consistent with age of record HENT: normocephalic; atraumatic Eyes: Normal appearance Neck: supple Heart: regular rate and rhythm Lungs: clear to auscultation bilaterally Abdomen: soft; nondistended; nontender; bowel sounds present; a few petechiae of the umbilicus with superficial ecchymosis of right lower quadrant:    Extremities: No deformity; full range of motion; pulses normal Neurologic: Awake, alert and oriented; motor function intact in all extremities and symmetric; no facial  droop Skin: Warm and dry Psychiatric: Normal mood and affect   RESULTS  Summary of this visit's results, reviewed and interpreted by myself:   EKG Interpretation  Date/Time:    Ventricular Rate:    PR Interval:    QRS Duration:   QT Interval:    QTC Calculation:   R Axis:     Text Interpretation:         Laboratory Studies: Results for orders placed or performed during the hospital encounter of 09/04/20 (from the past 24 hour(s))  Lipase, blood     Status: None   Collection Time: 09/04/20  1:44 AM  Result Value Ref Range   Lipase 23 11 - 51 U/L  Comprehensive metabolic panel     Status: Abnormal   Collection Time: 09/04/20  1:44 AM  Result Value Ref Range   Sodium 138 135 - 145 mmol/L   Potassium 3.2 (L) 3.5 - 5.1 mmol/L   Chloride 104 98 - 111 mmol/L   CO2 24 22 - 32 mmol/L   Glucose, Bld 98 70 - 99 mg/dL   BUN 19 6 - 20 mg/dL   Creatinine, Ser 1.00 0.44 - 1.00 mg/dL   Calcium 9.3 8.9 - 10.3 mg/dL   Total Protein 7.1 6.5 - 8.1 g/dL   Albumin 4.1 3.5 - 5.0 g/dL   AST 18 15 - 41 U/L   ALT 16 0 - 44 U/L   Alkaline Phosphatase 56 38 - 126 U/L   Total Bilirubin 0.4 0.3 - 1.2 mg/dL   GFR, Estimated >60 >60 mL/min   Anion gap 10 5 - 15  CBC with Differential     Status: None   Collection Time: 09/04/20  1:44 AM  Result Value Ref Range   WBC 8.7 4.0 - 10.5 K/uL   RBC 4.83 3.87 - 5.11 MIL/uL   Hemoglobin 13.9 12.0 - 15.0 g/dL   HCT 42.1 36.0 - 46.0 %   MCV 87.2 80.0 - 100.0 fL   MCH 28.8 26.0 - 34.0 pg   MCHC 33.0 30.0 - 36.0 g/dL   RDW 14.4 11.5 - 15.5 %   Platelets 355 150 - 400 K/uL   nRBC 0.0 0.0 - 0.2 %   Neutrophils Relative % 66 %   Neutro Abs 5.9 1.7 - 7.7 K/uL   Lymphocytes Relative 22 %   Lymphs Abs 1.9 0.7 - 4.0 K/uL   Monocytes Relative 9 %   Monocytes Absolute 0.7 0.1 - 1.0 K/uL   Eosinophils Relative 1 %   Eosinophils Absolute 0.1 0.0 - 0.5 K/uL   Basophils Relative 1 %   Basophils Absolute 0.0 0.0 - 0.1 K/uL   Immature Granulocytes 1 %    Abs Immature Granulocytes 0.06 0.00 - 0.07 K/uL  hCG, serum, qualitative     Status: None   Collection Time: 09/04/20  2:20 AM  Result Value Ref Range   Preg, Serum NEGATIVE NEGATIVE   Imaging Studies: DG Abdomen 1 View  Result Date: 09/04/2020 CLINICAL DATA:  Constipation, nausea, unspecified abdominal pain EXAM: ABDOMEN - 1 VIEW COMPARISON:  06/16/2018 FINDINGS: The bowel gas pattern is normal. No radio-opaque calculi or other significant radiographic abnormality are seen. IMPRESSION: Negative. Electronically Signed   By: Fidela Salisbury M.D.   On: 09/04/2020 03:19    ED COURSE and MDM  Nursing notes, initial and subsequent vitals signs, including pulse oximetry, reviewed and interpreted by myself.  Vitals:   09/04/20 0129 09/04/20 0130 09/04/20 0132 09/04/20 0300  BP: (!) 168/102 (!) 168/102  113/69  Pulse: 87 91  68  Resp:    17  Temp: 98 F (36.7 C)     SpO2: 96% 98%  96%  Weight:   122.5 kg   Height:   '5\' 5"'$  (1.651 m)    Medications  potassium chloride SA (KLOR-CON) CR tablet 40 mEq (40 mEq Oral Given 09/04/20 0312)   3:24 AM There is no evidence the patient has hemorrhagic pancreatitis.  Patient's with hemorrhagic pancreatitis or critically ill and usually present in extremis.  The patient's abdominal ecchymosis is likely due to minor trauma that did not come to her attention at the time.  She states she has an active dog who may have caused the bruise during play.  Petechiae of her umbilicus appear superficial and, unlike Cullen's  sign, there is no bluish tint to her umbilicus.  The constipation is likely due to her low fiber, high-protein diet.  She was advised that fiber such as psyllium husks are generally not absorbed and should not count as carbohydrate intake.  She was given K-Dur for her acute mild hypokalemia.   PROCEDURES  Procedures   ED DIAGNOSES     ICD-10-CM   1. Concern about digestive disease without diagnosis  Z71.1     2. Acute constipation  K59.00      3. Contusion of abdominal wall, initial encounter  S30.1XXA     4. Hypokalemia  E87.6          Merna Baldi, Jenny Reichmann, MD 09/04/20 [id number]

## 2020-09-04 NOTE — ED Triage Notes (Signed)
Patient states she started a diet on the 17th and having headaches, nausea, constipation, abd pain. Patient has noticed a bruise on the right side of her abd with no explanation.

## 2020-09-08 ENCOUNTER — Other Ambulatory Visit: Payer: Self-pay

## 2020-09-08 ENCOUNTER — Ambulatory Visit (INDEPENDENT_AMBULATORY_CARE_PROVIDER_SITE_OTHER): Payer: No Typology Code available for payment source | Admitting: Bariatrics

## 2020-09-08 ENCOUNTER — Encounter (INDEPENDENT_AMBULATORY_CARE_PROVIDER_SITE_OTHER): Payer: Self-pay | Admitting: Bariatrics

## 2020-09-08 ENCOUNTER — Other Ambulatory Visit (HOSPITAL_COMMUNITY): Payer: Self-pay

## 2020-09-08 VITALS — BP 141/77 | HR 105 | Temp 98.3°F | Ht 65.0 in | Wt 268.0 lb

## 2020-09-08 DIAGNOSIS — K5909 Other constipation: Secondary | ICD-10-CM

## 2020-09-08 DIAGNOSIS — E7800 Pure hypercholesterolemia, unspecified: Secondary | ICD-10-CM

## 2020-09-08 DIAGNOSIS — E559 Vitamin D deficiency, unspecified: Secondary | ICD-10-CM

## 2020-09-08 DIAGNOSIS — Z6841 Body Mass Index (BMI) 40.0 and over, adult: Secondary | ICD-10-CM

## 2020-09-08 DIAGNOSIS — Z9189 Other specified personal risk factors, not elsewhere classified: Secondary | ICD-10-CM

## 2020-09-08 DIAGNOSIS — E66813 Obesity, class 3: Secondary | ICD-10-CM

## 2020-09-08 DIAGNOSIS — R7303 Prediabetes: Secondary | ICD-10-CM

## 2020-09-08 MED ORDER — LINACLOTIDE 145 MCG PO CAPS
145.0000 ug | ORAL_CAPSULE | Freq: Every day | ORAL | 0 refills | Status: DC
  Filled 2020-09-08: qty 30, 30d supply, fill #0

## 2020-09-09 ENCOUNTER — Encounter (INDEPENDENT_AMBULATORY_CARE_PROVIDER_SITE_OTHER): Payer: Self-pay | Admitting: Bariatrics

## 2020-09-09 NOTE — Progress Notes (Signed)
Chief Complaint:   OBESITY Dana Washington is here to discuss her progress with her obesity treatment plan along with follow-up of her obesity related diagnoses. Dana Washington is on the Category 3 Plan and states she is following her eating plan approximately 100% of the time. Dana Washington states she is doing 0 minutes 0 times per week.  Today's visit was #: 3 Starting weight: 273 lbs Starting date: 08/27/2020 Today's weight: 268 lbs Today's date:09/08/2020 Total lbs lost to date: 5 lbs Total lbs lost since last in-office visit: 5 lbs  Interim History: Dana Washington is down 5 lbs since her first visit. She is getting in protein shake and vegetables.  Subjective:   1. Prediabetes Dana Washington is currently not on medications. Her last A1C was 5.9. Her IR was 29.0.  2. Pure hypercholesterolemia Dana Washington is currently taking Crestor.  3. Other constipation Dana Washington notes abdominal pain and gas. She states headache. She is currently taking Miralax every moring.  4. Vitamin D deficiency Dana Washington is currently taking OTC Vitamin D. Her last Vitamin D level was 45.7.  5. At risk for diabetes mellitus Dana Washington is at risk for diabetes mellitus due to Pre-diabetes.  Assessment/Plan:   1. Prediabetes Dana Washington will continue to work on weight loss, exercise, and decreasing simple carbohydrates to help decrease the risk of diabetes. She will increase healthy fats and protein.  2. Pure hypercholesterolemia Dana Washington will continue her medication.  3. Other constipation Dana Washington will increase water and fiber. We will refill Dana Washington 145 mcg for 1 month with no refills. She was informed that a decrease in bowel movement frequency is normal while losing weight, but stools should not be hard or painful. Orders and follow up as documented in patient record.   Counseling Getting to Good Bowel Health: Your goal is to have one soft bowel movement each day. Drink at least 8 glasses of water each day. Eat plenty of fiber (goal is  over 25 grams each day). It is best to get most of your fiber from dietary sources which includes leafy green vegetables, fresh fruit, and whole grains. You may need to add fiber with the help of OTC fiber supplements. These include Metamucil, Citrucel, and Flaxseed. If you are still having trouble, try adding Miralax or Magnesium Citrate. If all of these changes do not work, Cabin crew.   - linaclotide (Dana Washington) 145 MCG CAPS capsule; Take 1 capsule (145 mcg total) by mouth daily before breakfast.  Dispense: 30 capsule; Refill: 0  4. Vitamin D deficiency Low Vitamin D level contributes to fatigue and are associated with obesity, breast, and colon cancer. Dana Washington will continue OTC Vitamin D 2,000 IU every week and she will follow-up for routine testing of Vitamin D, at least 2-3 times per year to avoid over-replacement.   5. At risk for diabetes mellitus Dana Washington was given approximately 15 minutes of diabetes education and counseling today. We discussed intensive lifestyle modifications today with an emphasis on weight loss as well as increasing exercise and decreasing simple carbohydrates in her diet. We also reviewed medication options with an emphasis on risk versus benefit of those discussed.   Repetitive spaced learning was employed today to elicit superior memory formation and behavioral change.   6. Class 3 severe obesity with serious comorbidity and body mass index (BMI) of 45.0 to 49.9 in adult, unspecified obesity type (HCC) Dana Washington is currently in the action stage of change. As such, her goal is to continue with weight loss efforts. She has agreed to  the Category 3 Plan.   Dana Washington will continue meal planning. She will adhere closely to the plan. We will review labs from 08/27/2020.  Exercise goals: No exercise has been prescribed at this time.  Behavioral modification strategies: increasing lean protein intake, decreasing simple carbohydrates, increasing vegetables, increasing  water intake, decreasing eating out, no skipping meals, meal planning and cooking strategies, keeping healthy foods in the home, and planning for success.  Dana Washington has agreed to follow-up with our clinic in 2 weeks. She was informed of the importance of frequent follow-up visits to maximize her success with intensive lifestyle modifications for her multiple health conditions.   Objective:   Blood pressure (!) 141/77, pulse (!) 105, temperature 98.3 F (36.8 C), height '5\' 5"'$  (1.651 m), weight 268 lb (121.6 kg), last menstrual period 08/20/2020, SpO2 96 %. Body mass index is 44.6 kg/m.  General: Cooperative, alert, well developed, in no acute distress. HEENT: Conjunctivae and lids unremarkable. Cardiovascular: Regular rhythm.  Lungs: Normal work of breathing. Neurologic: No focal deficits.   Lab Results  Component Value Date   CREATININE 1.00 09/04/2020   BUN 19 09/04/2020   NA 138 09/04/2020   K 3.2 (L) 09/04/2020   CL 104 09/04/2020   CO2 24 09/04/2020   Lab Results  Component Value Date   ALT 16 09/04/2020   AST 18 09/04/2020   ALKPHOS 56 09/04/2020   BILITOT 0.4 09/04/2020   Lab Results  Component Value Date   HGBA1C 5.9 (H) 08/27/2020   HGBA1C 5.5 06/19/2019   HGBA1C 5.6 04/10/2018   HGBA1C 5.4 04/01/2017   HGBA1C 5.3 09/27/2016   Lab Results  Component Value Date   INSULIN 28.0 (H) 08/27/2020   Lab Results  Component Value Date   TSH 1.24 07/18/2020   Lab Results  Component Value Date   CHOL 258 (H) 07/18/2020   HDL 55 07/18/2020   LDLCALC 169 (H) 07/18/2020   TRIG 182 (H) 07/18/2020   CHOLHDL 4.7 07/18/2020   Lab Results  Component Value Date   VD25OH 45.7 08/27/2020   VD25OH 32 06/19/2019   VD25OH 33 04/10/2018   Lab Results  Component Value Date   WBC 8.7 09/04/2020   HGB 13.9 09/04/2020   HCT 42.1 09/04/2020   MCV 87.2 09/04/2020   PLT 355 09/04/2020   No results found for: IRON, TIBC, FERRITIN  Attestation Statements:   Reviewed by  clinician on day of visit: allergies, medications, problem list, medical history, surgical history, family history, social history, and previous encounter notes.  I, Lizbeth Bark, RMA, am acting as Location manager for [employer], DO.   I have reviewed the above documentation for accuracy and completeness, and I agree with the above. Jearld Lesch, DO

## 2020-09-10 ENCOUNTER — Other Ambulatory Visit (HOSPITAL_COMMUNITY): Payer: Self-pay

## 2020-09-10 MED ORDER — CARESTART COVID-19 HOME TEST VI KIT
PACK | 0 refills | Status: DC
  Filled 2020-09-10: qty 4, 4d supply, fill #0

## 2020-09-11 ENCOUNTER — Other Ambulatory Visit (HOSPITAL_COMMUNITY): Payer: Self-pay

## 2020-09-13 ENCOUNTER — Encounter: Payer: Self-pay | Admitting: Physician Assistant

## 2020-09-13 ENCOUNTER — Telehealth: Payer: No Typology Code available for payment source | Admitting: Physician Assistant

## 2020-09-13 DIAGNOSIS — U071 COVID-19: Secondary | ICD-10-CM

## 2020-09-13 DIAGNOSIS — R059 Cough, unspecified: Secondary | ICD-10-CM

## 2020-09-15 ENCOUNTER — Telehealth: Payer: Self-pay | Admitting: Internal Medicine

## 2020-09-15 MED ORDER — HYDROCODONE BIT-HOMATROP MBR 5-1.5 MG/5ML PO SOLN: 5.0000 mL | Freq: Three times a day (TID) | ORAL | 0 refills | Status: DC | PRN

## 2020-09-15 MED ORDER — AZITHROMYCIN 250 MG PO TABS: ORAL_TABLET | ORAL | 0 refills | Status: AC

## 2020-09-15 NOTE — Telephone Encounter (Signed)
Patient has messaged me that she has a fever and cough with discolored sputum. Says a script was supposed to have been sent with e-visit today but has not been sent in. I am sending in Hycodan Syrup for cough and a Zithromax Z-pak. I would like to see patient virtually tomorrow. She needs to monitor pulse  ox and walk some. Stay well hydrated. MJB. MD

## 2020-09-16 ENCOUNTER — Encounter: Payer: Self-pay | Admitting: Internal Medicine

## 2020-09-16 ENCOUNTER — Telehealth (INDEPENDENT_AMBULATORY_CARE_PROVIDER_SITE_OTHER): Payer: No Typology Code available for payment source | Admitting: Internal Medicine

## 2020-09-16 VITALS — Temp 99.9°F

## 2020-09-16 DIAGNOSIS — U071 COVID-19: Secondary | ICD-10-CM

## 2020-09-16 NOTE — Progress Notes (Signed)
   Subjective:    Patient ID: Dana Washington, female    DOB: Sep 18, 1975, 45 y.o.   MRN: [id number]  HPI 45 year old Female seen today via interactive audio and video telecommunications due to the Coronavirus pandemic.   Patient is at her home and I am at my office.  She is agreeable to visit in this format today.  She is identified using 2 identifiers as Dana Washington, a patient in this practice.  Patient says she tested positive for COVID-19 on September 3. She had an e-visit by another provider. Tessalon perles were recommended but apparently not sent in to pharmacy according to patient.  Patient says she was exposed at work.  Initially had stuffy nose and a little dry cough.  Initially messaged me with this information.  Patient was advised to follow-up if not doing well.  She messaged me on September 4 saying she did not have chills but an intermittent fever up to 101.5 degrees that improved after taking Tylenol.  Did have yellowish-brown sputum production.  Felt miserable.  We subsequently sent in Hycodan and Zithromax Z-PAK on September 5 and advised virtual visit today.  Patient has been taking Tylenol and Advil.  She has had myalgias.  Says pulse oximetry is 96% to 98%.  Says temperature is currently 99.9 degrees.  Has malaise and fatigue.  She is able to stay hydrated.  No vomiting.  Records indicate she had 3 COVID-19 vaccines with the last one being in October 2021.  Patient has a history of migraine headaches, hypertension, impaired glucose tolerance, anxiety, functional constipation obesity, hypothyroidism.  She takes Synthroid,  generic Crestor , Prozac, Prilosec, Topamax to prevent migraine headaches.  Additional history: Presented to the Emergency Department on August 25 after starting a high-protein diet on August 17 and subsequently developed constipation.  Was concerned about a bruise on her abdominal wall with a few petechiae from umbilicus.  She thought she  might have hemorrhagic pancreatitis.  Extensive ED evaluation revealed blood pressure to be elevated at 168/102 which was thought to be due to anxiety.  He had mild hypokalemia and was given K-Dur.  KUB showed no evidence of bowel obstruction.   Review of Systems see above regarding symptoms     Objective:   Physical Exam Reported temperature 99.9 degrees and reported pulse oximetry is 96-98%  She looks a bit pale and sounds congested when she speaks.  Able to give a clear concise history.       Assessment & Plan:   COVID-19 virus infection-appears to be stable without hypoxia or significant tachypnea.Has headache, malaise and fatigue.  Plan: Complete Zithromax Z-Pak that was called in for her yesterday and take Hycodan 1 teaspoon every 6-8 hours as needed for cough and myalgias.  May continue to alternate Tylenol with Advil for fever.  Continue to monitor pulse oximetry and walk some to prevent atelectasis.  Patient is to telephone me Wednesday morning and let me know how things are going.  We can decide on a date to return to work after we see how things are going in the next day or 2. She will also be in contact with [facility].

## 2020-09-17 MED ORDER — BENZONATATE 100 MG PO CAPS: 100.0000 mg | ORAL_CAPSULE | Freq: Three times a day (TID) | ORAL | 0 refills | Status: DC | PRN

## 2020-09-17 NOTE — Progress Notes (Signed)

## 2020-09-18 ENCOUNTER — Encounter: Payer: Self-pay | Admitting: Internal Medicine

## 2020-09-18 ENCOUNTER — Telehealth: Payer: Self-pay | Admitting: Internal Medicine

## 2020-09-18 DIAGNOSIS — U071 COVID-19: Secondary | ICD-10-CM

## 2020-09-18 DIAGNOSIS — J22 Unspecified acute lower respiratory infection: Secondary | ICD-10-CM

## 2020-09-18 MED ORDER — METHYLPREDNISOLONE 4 MG PO TABS: ORAL_TABLET | ORAL | 0 refills | Status: DC

## 2020-09-18 NOTE — Telephone Encounter (Signed)
Called to see how patient is doing. 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 She is hoarse. Advised stay out of workplace until Monday at the earliest. If not improving next week ,needs CXR. Sending in Medrol dose pack for cough and finish Z-pak. Stay well hydrated and continue to monitor pulse ox.

## 2020-09-28 MED FILL — Topiramate Tab 100 MG: ORAL | 90 days supply | Qty: 90 | Fill #1 | Status: AC

## 2020-09-29 ENCOUNTER — Other Ambulatory Visit (HOSPITAL_COMMUNITY): Payer: Self-pay

## 2020-09-30 ENCOUNTER — Telehealth: Payer: Self-pay | Admitting: Internal Medicine

## 2020-09-30 NOTE — Telephone Encounter (Signed)
[place] results to [facility] [phone], phone [phone]  Positive 09/13/2020

## 2020-10-06 ENCOUNTER — Ambulatory Visit (INDEPENDENT_AMBULATORY_CARE_PROVIDER_SITE_OTHER): Payer: No Typology Code available for payment source | Admitting: Bariatrics

## 2020-10-06 ENCOUNTER — Encounter (INDEPENDENT_AMBULATORY_CARE_PROVIDER_SITE_OTHER): Payer: Self-pay | Admitting: Bariatrics

## 2020-10-06 ENCOUNTER — Other Ambulatory Visit: Payer: Self-pay | Admitting: Internal Medicine

## 2020-10-06 ENCOUNTER — Other Ambulatory Visit: Payer: Self-pay

## 2020-10-06 VITALS — BP 106/72 | HR 106 | Temp 98.1°F | Ht 65.0 in | Wt 259.0 lb

## 2020-10-06 DIAGNOSIS — E7800 Pure hypercholesterolemia, unspecified: Secondary | ICD-10-CM

## 2020-10-06 DIAGNOSIS — R7303 Prediabetes: Secondary | ICD-10-CM | POA: Diagnosis not present

## 2020-10-06 DIAGNOSIS — Z1231 Encounter for screening mammogram for malignant neoplasm of breast: Secondary | ICD-10-CM

## 2020-10-06 NOTE — Progress Notes (Signed)
Chief Complaint:   OBESITY Jehan is here to discuss her progress with her obesity treatment plan along with follow-up of her obesity related diagnoses. Yanisa is on the Category 3 Plan and states she is following her eating plan approximately 75% of the time. Lashanta states she is doing 0 minutes 0 times per week.  Today's visit was #: 4 Starting weight: 273 lbs Starting date: 08/27/2020 Today's weight: 259 lbs Today's date: 10/06/2020 Total lbs lost to date: 14 lbs Total lbs lost since last in-office visit: 9 lbs  Interim History: Shahad is down 9 lbs since her last visit. She had the Covid infection and did not eat and then got back on the plan.   Subjective:   1. Prediabetes Latanza is currently not on medications.   2. Pure hypercholesterolemia Shaylynne is taking Crestor currently.   Assessment/Plan:   1. Prediabetes Ashleyanne will continue to work on weight loss, plan and exercise, and decreasing simple carbohydrates to help decrease the risk of diabetes. She will minimize unhealthy carbohydrates.  2. Pure hypercholesterolemia Cardiovascular risk and specific lipid/LDL goals reviewed.  We discussed several lifestyle modifications today and Hena will continue to work on diet, exercise and weight loss efforts. Kala  will increase lean fats and fatty meats. She will increase MUFA's and PUFA's. Orders and follow up as documented in patient record.   Counseling Intensive lifestyle modifications are the first line treatment for this issue. Dietary changes: Increase soluble fiber. Decrease simple carbohydrates. Exercise changes: Moderate to vigorous-intensity aerobic activity 150 minutes per week if tolerated. Lipid-lowering medications: see documented in medical record.   3. Obesity, current BMI 43.2 Raniah is currently in the action stage of change. As such, her goal is to continue with weight loss efforts. She has agreed to the Category 3 Plan.   Sreenidhi will  continue meal planning. She will adhere closely to the plan (80-90%). She will continue to keep water intake high.   Exercise goals: No exercise has been prescribed at this time.  Behavioral modification strategies: increasing lean protein intake, decreasing simple carbohydrates, increasing vegetables, increasing water intake, decreasing eating out, no skipping meals, meal planning and cooking strategies, keeping healthy foods in the home, and planning for success.  Adoria has agreed to follow-up with our clinic in 3 weeks. She was informed of the importance of frequent follow-up visits to maximize her success with intensive lifestyle modifications for her multiple health conditions.   Objective:   Blood pressure 106/72, pulse (!) 106, temperature 98.1 F (36.7 C), height 5\' 5"  (1.651 m), weight 259 lb (117.5 kg), SpO2 98 %. Body mass index is 43.1 kg/m.  General: Cooperative, alert, well developed, in no acute distress. HEENT: Conjunctivae and lids unremarkable. Cardiovascular: Regular rhythm.  Lungs: Normal work of breathing. Neurologic: No focal deficits.   Lab Results  Component Value Date   CREATININE 1.00 09/04/2020   BUN 19 09/04/2020   NA 138 09/04/2020   K 3.2 (L) 09/04/2020   CL 104 09/04/2020   CO2 24 09/04/2020   Lab Results  Component Value Date   ALT 16 09/04/2020   AST 18 09/04/2020   ALKPHOS 56 09/04/2020   BILITOT 0.4 09/04/2020   Lab Results  Component Value Date   HGBA1C 5.9 (H) 08/27/2020   HGBA1C 5.5 06/19/2019   HGBA1C 5.6 04/10/2018   HGBA1C 5.4 04/01/2017   HGBA1C 5.3 09/27/2016   Lab Results  Component Value Date   INSULIN 28.0 (H) 08/27/2020  Lab Results  Component Value Date   TSH 1.24 07/18/2020   Lab Results  Component Value Date   CHOL 258 (H) 07/18/2020   HDL 55 07/18/2020   LDLCALC 169 (H) 07/18/2020   TRIG 182 (H) 07/18/2020   CHOLHDL 4.7 07/18/2020   Lab Results  Component Value Date   VD25OH 45.7 08/27/2020   VD25OH  32 06/19/2019   VD25OH 33 04/10/2018   Lab Results  Component Value Date   WBC 8.7 09/04/2020   HGB 13.9 09/04/2020   HCT 42.1 09/04/2020   MCV 87.2 09/04/2020   PLT 355 09/04/2020   No results found for: IRON, TIBC, FERRITIN  Obesity Behavioral Intervention:   Approximately 15 minutes were spent on the discussion below.  ASK: We discussed the diagnosis of obesity with Zendayah today and Severa agreed to give [place] permission to discuss obesity behavioral modification therapy today.  ASSESS: Ann-Marie has the diagnosis of obesity and her BMI today is 43.2. Ludwika is in the action stage of change.   ADVISE: Amilyah was educated on the multiple health risks of obesity as well as the benefit of weight loss to improve her health. She was advised of the need for long term treatment and the importance of lifestyle modifications to improve her current health and to decrease her risk of future health problems.  AGREE: Multiple dietary modification options and treatment options were discussed and Allix agreed to follow the recommendations documented in the above note.  ARRANGE: Maloree was educated on the importance of frequent visits to treat obesity as outlined per CMS and USPSTF guidelines and agreed to schedule her next follow up appointment today.  Attestation Statements:   Reviewed by clinician on day of visit: allergies, medications, problem list, medical history, surgical history, family history, social history, and previous encounter notes.   I, Lizbeth Bark, RMA, am acting as Location manager for [employer], DO.   I have reviewed the above documentation for accuracy and completeness, and I agree with the above. Jearld Lesch, DO

## 2020-10-07 ENCOUNTER — Encounter (INDEPENDENT_AMBULATORY_CARE_PROVIDER_SITE_OTHER): Payer: Self-pay | Admitting: Bariatrics

## 2020-10-10 ENCOUNTER — Other Ambulatory Visit (HOSPITAL_COMMUNITY): Payer: Self-pay

## 2020-10-10 MED ORDER — CARESTART COVID-19 HOME TEST VI KIT
PACK | 0 refills | Status: DC
  Filled 2020-10-10: qty 4, 4d supply, fill #0

## 2020-10-13 ENCOUNTER — Other Ambulatory Visit (HOSPITAL_COMMUNITY): Payer: Self-pay

## 2020-10-13 MED ORDER — NORETHIN ACE-ETH ESTRAD-FE 1.5-30 MG-MCG PO TABS
1.0000 | ORAL_TABLET | Freq: Every day | ORAL | 4 refills | Status: DC
  Filled 2020-10-13: qty 84, 84d supply, fill #0
  Filled 2020-12-26: qty 84, 84d supply, fill #1
  Filled 2021-03-21: qty 84, 84d supply, fill #2
  Filled 2021-06-21: qty 84, 84d supply, fill #3
  Filled 2021-09-09: qty 84, 84d supply, fill #4

## 2020-10-21 ENCOUNTER — Other Ambulatory Visit: Payer: No Typology Code available for payment source | Admitting: Internal Medicine

## 2020-10-21 ENCOUNTER — Other Ambulatory Visit: Payer: Self-pay

## 2020-10-21 DIAGNOSIS — I10 Essential (primary) hypertension: Secondary | ICD-10-CM

## 2020-10-21 DIAGNOSIS — E7800 Pure hypercholesterolemia, unspecified: Secondary | ICD-10-CM

## 2020-10-21 LAB — COMPLETE METABOLIC PANEL WITH GFR
AG Ratio: 1.7 (calc) (ref 1.0–2.5)
ALT: 21 U/L (ref 6–29)
AST: 17 U/L (ref 10–35)
Albumin: 4.6 g/dL (ref 3.6–5.1)
Alkaline phosphatase (APISO): 75 U/L (ref 31–125)
BUN: 15 mg/dL (ref 7–25)
CO2: 27 mmol/L (ref 20–32)
Calcium: 9.7 mg/dL (ref 8.6–10.2)
Chloride: 101 mmol/L (ref 98–110)
Creat: 0.84 mg/dL (ref 0.50–0.99)
Globulin: 2.7 g/dL (calc) (ref 1.9–3.7)
Glucose, Bld: 88 mg/dL (ref 65–99)
Potassium: 4.4 mmol/L (ref 3.5–5.3)
Sodium: 138 mmol/L (ref 135–146)
Total Bilirubin: 0.4 mg/dL (ref 0.2–1.2)
Total Protein: 7.3 g/dL (ref 6.1–8.1)
eGFR: 87 mL/min/{1.73_m2} (ref >=60)

## 2020-10-21 LAB — LIPID PANEL
Cholesterol: 165 mg/dL (ref <200)
HDL: 56 mg/dL (ref >=50)
LDL Cholesterol (Calc): 87 mg/dL (calc)
Non-HDL Cholesterol (Calc): 109 mg/dL (calc) (ref <130)
Total CHOL/HDL Ratio: 2.9 (calc) (ref <5.0)
Triglycerides: 119 mg/dL (ref <150)

## 2020-10-24 DIAGNOSIS — Z1231 Encounter for screening mammogram for malignant neoplasm of breast: Secondary | ICD-10-CM

## 2020-10-26 ENCOUNTER — Other Ambulatory Visit: Payer: Self-pay | Admitting: Internal Medicine

## 2020-10-27 ENCOUNTER — Encounter (INDEPENDENT_AMBULATORY_CARE_PROVIDER_SITE_OTHER): Payer: Self-pay | Admitting: Family Medicine

## 2020-10-27 ENCOUNTER — Other Ambulatory Visit: Payer: Self-pay

## 2020-10-27 ENCOUNTER — Ambulatory Visit (INDEPENDENT_AMBULATORY_CARE_PROVIDER_SITE_OTHER): Payer: No Typology Code available for payment source | Admitting: Family Medicine

## 2020-10-27 ENCOUNTER — Other Ambulatory Visit (HOSPITAL_COMMUNITY): Payer: Self-pay

## 2020-10-27 VITALS — BP 117/83 | HR 84 | Temp 97.9°F | Ht 65.0 in | Wt 255.0 lb

## 2020-10-27 DIAGNOSIS — I10 Essential (primary) hypertension: Secondary | ICD-10-CM

## 2020-10-27 DIAGNOSIS — Z6841 Body Mass Index (BMI) 40.0 and over, adult: Secondary | ICD-10-CM

## 2020-10-27 MED ORDER — TOPIRAMATE 100 MG PO TABS
ORAL_TABLET | ORAL | 3 refills | Status: DC
  Filled 2020-10-27: qty 90, fill #0
  Filled 2020-12-26: qty 90, 90d supply, fill #0
  Filled 2021-03-29: qty 90, 90d supply, fill #1
  Filled 2021-06-21: qty 90, 90d supply, fill #2
  Filled 2021-09-21: qty 90, 90d supply, fill #3

## 2020-10-27 NOTE — Progress Notes (Signed)
Chief Complaint:   OBESITY Dana Washington is here to discuss her progress with her obesity treatment plan along with follow-up of her obesity related diagnoses. Dana Washington is on the Category 3 Plan and states she is following her eating plan approximately 99.8% of the time. 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   Today's visit was #: 5 Starting weight: 273 lbs Starting date: 08/27/2020 Today's weight: 255 lbs Today's date: 10/27/2020 Total lbs lost to date: 18 Total lbs lost since last in-office visit: 4  Interim History: Dana Washington continues to do well with weight loss on her modified Category 3 plan. 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  Subjective:   1. Essential hypertension Dana Washington's blood pressure is well controlled on her medications. She continues to do well with diet, exercise, and weight loss. She denies hypotension and she suspects her hypertension is mostly hereditary based on past weight loss efforts.  Assessment/Plan:   1. Essential hypertension Dana Washington will continue Losartan, diet, and exercise to improve blood pressure control. Will continue to monitor as she continues her lifestyle modifications.  2. Obesity, current BMI 42.4 Dana Washington is currently in the action stage of change. As such, her goal is to continue with weight loss efforts. She has agreed to the Category 3 Plan (modified).   Exercise goals: As is.  Behavioral modification strategies: dealing with family or coworker sabotage and celebration eating strategies.  Dana Washington has agreed to follow-up with our clinic in 2 to 3 weeks. She was informed of the importance of frequent follow-up visits to maximize her success with intensive lifestyle modifications for her multiple health conditions.   Objective:   Blood pressure 117/83, pulse 84, temperature 97.9 F (36.6 C), height 5\' 5"  (1.651 m), weight 255 lb  (115.7 kg), SpO2 96 %. Body mass index is 42.43 kg/m.  General: Cooperative, alert, well developed, in no acute distress. HEENT: Conjunctivae and lids unremarkable. Cardiovascular: Regular rhythm.  Lungs: Normal work of breathing. Neurologic: No focal deficits.   Lab Results  Component Value Date   CREATININE 0.84 10/21/2020   BUN 15 10/21/2020   NA 138 10/21/2020   K 4.4 10/21/2020   CL 101 10/21/2020   CO2 27 10/21/2020   Lab Results  Component Value Date   ALT 21 10/21/2020   AST 17 10/21/2020   ALKPHOS 56 09/04/2020   BILITOT 0.4 10/21/2020   Lab Results  Component Value Date   HGBA1C 5.9 (H) 08/27/2020   HGBA1C 5.5 06/19/2019   HGBA1C 5.6 04/10/2018   HGBA1C 5.4 04/01/2017   HGBA1C 5.3 09/27/2016   Lab Results  Component Value Date   INSULIN 28.0 (H) 08/27/2020   Lab Results  Component Value Date   TSH 1.24 07/18/2020   Lab Results  Component Value Date   CHOL 165 10/21/2020   HDL 56 10/21/2020   LDLCALC 87 10/21/2020   TRIG 119 10/21/2020   CHOLHDL 2.9 10/21/2020   Lab Results  Component Value Date   VD25OH 45.7 08/27/2020   VD25OH 32 06/19/2019   VD25OH 33 04/10/2018   Lab Results  Component Value Date   WBC 8.7 09/04/2020   HGB 13.9 09/04/2020   HCT 42.1 09/04/2020   MCV 87.2 09/04/2020   PLT 355 09/04/2020   No results found for: IRON, TIBC, FERRITIN  Attestation Statements:   Reviewed by clinician on day of visit: allergies, medications, problem list, medical  history, surgical history, family history, social history, and previous encounter notes.  Time spent on visit including pre-visit chart review and post-visit care and charting was 33 minutes.    I, Trixie Dredge, am acting as transcriptionist for Dennard Nip, MD.  I have reviewed the above documentation for accuracy and completeness, and I agree with the above. -  Dennard Nip, MD

## 2020-10-30 ENCOUNTER — Ambulatory Visit (HOSPITAL_COMMUNITY)
Admission: RE | Admit: 2020-10-30 | Discharge: 2020-10-30 | Disposition: A | Discharge status: Home / Self Care | Payer: No Typology Code available for payment source | Source: Ambulatory Visit | Attending: Internal Medicine | Admitting: Internal Medicine

## 2020-10-30 ENCOUNTER — Other Ambulatory Visit: Payer: Self-pay

## 2020-10-30 ENCOUNTER — Encounter: Payer: Self-pay | Admitting: Internal Medicine

## 2020-10-30 ENCOUNTER — Other Ambulatory Visit (HOSPITAL_COMMUNITY): Payer: Self-pay

## 2020-10-30 ENCOUNTER — Ambulatory Visit (INDEPENDENT_AMBULATORY_CARE_PROVIDER_SITE_OTHER): Payer: No Typology Code available for payment source | Admitting: Internal Medicine

## 2020-10-30 VITALS — BP 104/72 | HR 75 | Temp 98.3°F | Ht 65.0 in | Wt 227.0 lb

## 2020-10-30 DIAGNOSIS — Z1211 Encounter for screening for malignant neoplasm of colon: Secondary | ICD-10-CM

## 2020-10-30 DIAGNOSIS — R052 Subacute cough: Secondary | ICD-10-CM

## 2020-10-30 DIAGNOSIS — Z6841 Body Mass Index (BMI) 40.0 and over, adult: Secondary | ICD-10-CM

## 2020-10-30 MED ORDER — AZITHROMYCIN 250 MG PO TABS
ORAL_TABLET | ORAL | 0 refills | Status: AC
  Filled 2020-10-30: qty 6, 5d supply, fill #0

## 2020-10-30 MED ORDER — FLUCONAZOLE 150 MG PO TABS
150.0000 mg | ORAL_TABLET | Freq: Once | ORAL | 0 refills | Status: AC
  Filled 2020-10-30: qty 1, 1d supply, fill #0

## 2020-10-30 MED ORDER — FLUTICASONE-SALMETEROL 250-50 MCG/ACT IN AEPB
1.0000 | INHALATION_SPRAY | Freq: Two times a day (BID) | RESPIRATORY_TRACT | 1 refills | Status: DC
  Filled 2020-10-30: qty 60, 30d supply, fill #0
  Filled 2020-12-04: qty 60, 30d supply, fill #1

## 2020-10-30 MED ORDER — HYDROCODONE BIT-HOMATROP MBR 5-1.5 MG/5ML PO SOLN
5.0000 mL | Freq: Three times a day (TID) | ORAL | 0 refills | Status: DC | PRN
  Filled 2020-10-30: qty 120, 8d supply, fill #0

## 2020-10-30 MED ORDER — PREDNISONE 10 MG PO TABS
ORAL_TABLET | ORAL | 0 refills | Status: DC
  Filled 2020-10-30: qty 21, 6d supply, fill #0

## 2020-10-30 MED ORDER — ALBUTEROL SULFATE HFA 108 (90 BASE) MCG/ACT IN AERS
2.0000 | INHALATION_SPRAY | Freq: Four times a day (QID) | RESPIRATORY_TRACT | 0 refills | Status: DC | PRN
  Filled 2020-10-30: qty 8.5, 25d supply, fill #0

## 2020-10-30 NOTE — Progress Notes (Signed)
Buterol inhaler   Subjective:    Patient ID: Dana Washington, female    DOB: [birth year], 45 y.o.   MRN: [id number]  HPI 45 year old female in today for follow-up on mixed hyperlipidemia.  In July total cholesterol was 258, triglycerides 182 fasting and LDL cholesterol 169.  She was started on Crestor 20 mg daily and lipids have normalized.  Liver functions are normal.  She has no side effects from statin medication.  Ultimately developed COVID-19 in early September.  Still has post COVID cough.  Did not take Paxlovid at the time.  Was exposed at work by [employer].  Was treated with Hycodan and Zithromax.  Cough now is productive of thick clear sputum.  She is coughing in the office today.  Have ordered chest x-ray.    Review of Systems cough is  productive.  No fever or shaking chills.  No headache nausea or vomiting     Objective:   Physical Exam Blood pressure 104/72 pulse 75 temperature 98.3 degrees pulse oximetry 98% weight 227 pounds height 5 feet 5 inches BMI 37.77  Skin warm and dry.  No thyromegaly.  No carotid bruits.  Chest is clear to auscultation.  Cardiac exam regular rate and rhythm.       Assessment & Plan:  Mixed hyperlipidemia-lipids are now normal on Crestor 20 mg daily so we will continue with the same dose.  Liver functions are normal.  Health maintenance-physical exam due July 2023.  Referral made for colonoscopy.  Post COVID cough-have given her a tapering course of prednisone starting with prednisone 10 mg tablets (#21) going from 60 mg to 0 mg over 7 days.  Hycodan syrup refilled for cough.  To take 1 teaspoon every 6 hours as needed for cough.  She will take another Zithromax Z-PAK with directions 2 tablets day 1 followed by 1 tab days 2 through 5.  Have given her an albuterol inhaler to use 2 sprays 4 times daily as well as an Advair 250/50 inhaler to use 1 spray every 12 hours.  Call if not better in 3 to 4 weeks.  Have chest x-ray.  Diflucan if needed for  Candida vaginitis while on prednisone

## 2020-10-30 NOTE — Patient Instructions (Addendum)
Have chest x-ray.  Take prednisone in tapering course as directed starting with 6x10 mg tablets day 1 and decrease by 10 mg daily.  Have ordered albuterol inhaler to use 2 sprays 4 times a day and Advair inhaler 250/50 to use 1 spray every 12 hours.  Take Zithromax Z-PAK.  Diflucan has been sent.  Hycodan ordered to take 1 teaspoon every 6 hours as needed for cough.  Call if not better in 3 to 4 weeks.  Continue Crestor 20 mg daily for hyperlipidemia.  Diflucan if needed for Candida vaginitis while on prednisone.  Colon cancer screening referral made.

## 2020-10-31 ENCOUNTER — Other Ambulatory Visit (HOSPITAL_COMMUNITY): Payer: Self-pay

## 2020-11-01 ENCOUNTER — Other Ambulatory Visit (HOSPITAL_COMMUNITY): Payer: Self-pay

## 2020-11-19 ENCOUNTER — Ambulatory Visit (INDEPENDENT_AMBULATORY_CARE_PROVIDER_SITE_OTHER): Payer: No Typology Code available for payment source | Admitting: Family Medicine

## 2020-11-19 ENCOUNTER — Other Ambulatory Visit: Payer: Self-pay

## 2020-11-19 ENCOUNTER — Encounter (INDEPENDENT_AMBULATORY_CARE_PROVIDER_SITE_OTHER): Payer: Self-pay | Admitting: Family Medicine

## 2020-11-19 VITALS — BP 120/74 | HR 76 | Temp 98.4°F | Ht 65.0 in | Wt 249.0 lb

## 2020-11-19 DIAGNOSIS — Z6841 Body Mass Index (BMI) 40.0 and over, adult: Secondary | ICD-10-CM | POA: Diagnosis not present

## 2020-11-19 DIAGNOSIS — F439 Reaction to severe stress, unspecified: Secondary | ICD-10-CM | POA: Diagnosis not present

## 2020-11-20 ENCOUNTER — Ambulatory Visit
Admission: RE | Admit: 2020-11-20 | Discharge: 2020-11-20 | Disposition: A | Discharge status: Home / Self Care | Payer: No Typology Code available for payment source | Source: Ambulatory Visit | Attending: Internal Medicine | Admitting: Internal Medicine

## 2020-11-20 DIAGNOSIS — Z1231 Encounter for screening mammogram for malignant neoplasm of breast: Secondary | ICD-10-CM

## 2020-11-20 NOTE — Progress Notes (Signed)
Chief Complaint:   OBESITY Dana Washington is here to discuss her progress with her obesity treatment plan along with follow-up of her obesity related diagnoses. Dana Washington is on the Category 3 Plan (modified) and states she is following her eating plan approximately 80% of the time. Dana Washington states she is doing some walking.   Today's visit was #: 6 Starting weight: 273 lbs Starting date: 08/27/2020 Today's weight: 249 lbs Today's date: 11/19/2020 Total lbs lost to date: 24 Total lbs lost since last in-office visit: 6  Interim History: Dana Washington continues to do well with weight loss. She has been very busy at work, and she is missing some meals frequently.  Subjective:   1. Stress Dana Washington notes increased stress at work with being short staffed. She has been skipping meals due to no time to eat, which she recognizes as unhealthy.  Assessment/Plan:   1. Stress Dana Washington was encouraged to not forget her own health when it comes to her own nutrition, and activity and strategies to help her eat all of her food was discussed today.  2. Obesity, current BMI 41.6 Dana Washington is currently in the action stage of change. As such, her goal is to continue with weight loss efforts. She has agreed to the Category 3 Plan.   Exercise goals: As is.  Behavioral modification strategies: increasing water intake, no skipping meals, and holiday eating strategies .  Dana Washington has agreed to follow-up with our clinic in 2 to 3 weeks. She was informed of the importance of frequent follow-up visits to maximize her success with intensive lifestyle modifications for her multiple health conditions.   Objective:   Blood pressure 120/74, pulse 76, temperature 98.4 F (36.9 C), height 5\' 5"  (1.651 m), weight 249 lb (112.9 kg), SpO2 94 %. Body mass index is 41.44 kg/m.  General: Cooperative, alert, well developed, in no acute distress. HEENT: Conjunctivae and lids unremarkable. Cardiovascular: Regular rhythm.  Lungs:  Normal work of breathing. Neurologic: No focal deficits.   Lab Results  Component Value Date   CREATININE 0.84 10/21/2020   BUN 15 10/21/2020   NA 138 10/21/2020   K 4.4 10/21/2020   CL 101 10/21/2020   CO2 27 10/21/2020   Lab Results  Component Value Date   ALT 21 10/21/2020   AST 17 10/21/2020   ALKPHOS 56 09/04/2020   BILITOT 0.4 10/21/2020   Lab Results  Component Value Date   HGBA1C 5.9 (H) 08/27/2020   HGBA1C 5.5 06/19/2019   HGBA1C 5.6 04/10/2018   HGBA1C 5.4 04/01/2017   HGBA1C 5.3 09/27/2016   Lab Results  Component Value Date   INSULIN 28.0 (H) 08/27/2020   Lab Results  Component Value Date   TSH 1.24 07/18/2020   Lab Results  Component Value Date   CHOL 165 10/21/2020   HDL 56 10/21/2020   LDLCALC 87 10/21/2020   TRIG 119 10/21/2020   CHOLHDL 2.9 10/21/2020   Lab Results  Component Value Date   VD25OH 45.7 08/27/2020   VD25OH 32 06/19/2019   VD25OH 33 04/10/2018   Lab Results  Component Value Date   WBC 8.7 09/04/2020   HGB 13.9 09/04/2020   HCT 42.1 09/04/2020   MCV 87.2 09/04/2020   PLT 355 09/04/2020   No results found for: IRON, TIBC, FERRITIN  Attestation Statements:   Reviewed by clinician on day of visit: allergies, medications, problem list, medical history, surgical history, family history, social history, and previous encounter notes.  Time spent on visit including pre-visit  chart review and post-visit care and charting was 30 minutes.    I, Trixie Dredge, am acting as transcriptionist for Dennard Nip, MD.  I have reviewed the above documentation for accuracy and completeness, and I agree with the above. -  Dennard Nip, MD

## 2020-11-22 ENCOUNTER — Other Ambulatory Visit (HOSPITAL_COMMUNITY): Payer: Self-pay

## 2020-11-25 ENCOUNTER — Other Ambulatory Visit (HOSPITAL_COMMUNITY): Payer: Self-pay

## 2020-12-01 ENCOUNTER — Encounter: Payer: Self-pay | Admitting: Internal Medicine

## 2020-12-05 ENCOUNTER — Other Ambulatory Visit (HOSPITAL_COMMUNITY): Payer: Self-pay

## 2020-12-06 ENCOUNTER — Other Ambulatory Visit: Payer: Self-pay | Admitting: Internal Medicine

## 2020-12-06 MED ORDER — SYNTHROID 100 MCG PO TABS
100.0000 ug | ORAL_TABLET | Freq: Every day | ORAL | 0 refills | Status: DC
  Filled 2020-12-06: qty 90, 90d supply, fill #0

## 2020-12-08 ENCOUNTER — Other Ambulatory Visit (HOSPITAL_COMMUNITY): Payer: Self-pay

## 2020-12-09 ENCOUNTER — Ambulatory Visit (INDEPENDENT_AMBULATORY_CARE_PROVIDER_SITE_OTHER): Payer: No Typology Code available for payment source | Admitting: Family Medicine

## 2020-12-10 ENCOUNTER — Encounter (INDEPENDENT_AMBULATORY_CARE_PROVIDER_SITE_OTHER): Payer: Self-pay | Admitting: Family Medicine

## 2020-12-10 ENCOUNTER — Ambulatory Visit (INDEPENDENT_AMBULATORY_CARE_PROVIDER_SITE_OTHER): Payer: No Typology Code available for payment source | Admitting: Family Medicine

## 2020-12-10 ENCOUNTER — Other Ambulatory Visit: Payer: Self-pay

## 2020-12-10 VITALS — BP 113/80 | HR 91 | Temp 98.3°F | Ht 65.0 in | Wt 248.0 lb

## 2020-12-10 DIAGNOSIS — Z6841 Body Mass Index (BMI) 40.0 and over, adult: Secondary | ICD-10-CM

## 2020-12-10 DIAGNOSIS — I10 Essential (primary) hypertension: Secondary | ICD-10-CM

## 2020-12-11 NOTE — Progress Notes (Signed)
Chief Complaint:   OBESITY Dana Washington is here to discuss her progress with her obesity treatment plan along with follow-up of her obesity related diagnoses. Dana Washington is on the Category 3 Plan and states she is following her eating plan approximately 96.8% of the time. Dana Washington states she is walking steps at work.   Today's visit was #: 7 Starting weight: 273 lbs Starting date: 08/27/2020 Today's weight: 248 lbs Today's date: 12/10/2020 Total lbs lost to date: 25 Total lbs lost since last in-office visit: 1  Interim History: Dana Washington continues to do well with weight loss even over Thanksgiving. Her hunger is controlled and she is working on portion control and Psychologist, counselling.   Subjective:   1. Essential hypertension Dana Washington's blood pressure is well controlled. She is doing well with diet and exercise, she has no signs of hypotension.  Assessment/Plan:   1. Essential hypertension Dana Washington will continue her diet, exercise, and weight loss, and will watch for signs of hypotension as she continues her lifestyle modifications.  2. Obesity with current BMI of 41.3 Dana Washington is currently in the action stage of change. As such, her goal is to continue with weight loss efforts. She has agreed to the Category 3 Plan.   Exercise goals: As is.  Behavioral modification strategies: increasing lean protein intake, meal planning and cooking strategies, and holiday eating strategies .  Dana Washington has agreed to follow-up with our clinic in 2 weeks. She was informed of the importance of frequent follow-up visits to maximize her success with intensive lifestyle modifications for her multiple health conditions.   Objective:   Blood pressure 113/80, pulse 91, temperature 98.3 F (36.8 C), height 5\' 5"  (1.651 m), weight 248 lb (112.5 kg), SpO2 96 %. Body mass index is 41.27 kg/m.  General: Cooperative, alert, well developed, in no acute distress. HEENT: Conjunctivae and lids  unremarkable. Cardiovascular: Regular rhythm.  Lungs: Normal work of breathing. Neurologic: No focal deficits.   Lab Results  Component Value Date   CREATININE 0.84 10/21/2020   BUN 15 10/21/2020   NA 138 10/21/2020   K 4.4 10/21/2020   CL 101 10/21/2020   CO2 27 10/21/2020   Lab Results  Component Value Date   ALT 21 10/21/2020   AST 17 10/21/2020   ALKPHOS 56 09/04/2020   BILITOT 0.4 10/21/2020   Lab Results  Component Value Date   HGBA1C 5.9 (H) 08/27/2020   HGBA1C 5.5 06/19/2019   HGBA1C 5.6 04/10/2018   HGBA1C 5.4 04/01/2017   HGBA1C 5.3 09/27/2016   Lab Results  Component Value Date   INSULIN 28.0 (H) 08/27/2020   Lab Results  Component Value Date   TSH 1.24 07/18/2020   Lab Results  Component Value Date   CHOL 165 10/21/2020   HDL 56 10/21/2020   LDLCALC 87 10/21/2020   TRIG 119 10/21/2020   CHOLHDL 2.9 10/21/2020   Lab Results  Component Value Date   VD25OH 45.7 08/27/2020   VD25OH 32 06/19/2019   VD25OH 33 04/10/2018   Lab Results  Component Value Date   WBC 8.7 09/04/2020   HGB 13.9 09/04/2020   HCT 42.1 09/04/2020   MCV 87.2 09/04/2020   PLT 355 09/04/2020   No results found for: IRON, TIBC, FERRITIN  Attestation Statements:   Reviewed by clinician on day of visit: allergies, medications, problem list, medical history, surgical history, family history, social history, and previous encounter notes.  Time spent on visit including pre-visit chart review and post-visit care  and charting was 31 minutes.    I, Trixie Dredge, am acting as transcriptionist for Dennard Nip, MD.  I have reviewed the above documentation for accuracy and completeness, and I agree with the above. -  Dennard Nip, MD

## 2020-12-15 ENCOUNTER — Other Ambulatory Visit (HOSPITAL_BASED_OUTPATIENT_CLINIC_OR_DEPARTMENT_OTHER): Payer: Self-pay

## 2020-12-15 ENCOUNTER — Ambulatory Visit: Payer: No Typology Code available for payment source | Attending: Internal Medicine

## 2020-12-15 ENCOUNTER — Other Ambulatory Visit: Payer: Self-pay

## 2020-12-15 DIAGNOSIS — Z23 Encounter for immunization: Secondary | ICD-10-CM

## 2020-12-15 MED ORDER — PFIZER COVID-19 VAC BIVALENT 30 MCG/0.3ML IM SUSP
INTRAMUSCULAR | 0 refills | Status: DC
  Filled 2020-12-15: qty 0.3, 1d supply, fill #0

## 2020-12-15 NOTE — Progress Notes (Signed)
   Covid-19 Vaccination Clinic  Name:  Dana Washington    MRN: [id number] DOB: [birth year]  12/15/2020  [name] was observed post Covid-19 immunization for 15 minutes without incident. She was provided with Vaccine Information Sheet and instruction to access the V-Safe system.   [name] was instructed to call 911 with any severe reactions post vaccine: Difficulty breathing  Swelling of face and throat  A fast heartbeat  A bad rash all over body  Dizziness and weakness   Immunizations Administered     Name Date Dose VIS Date Route   Pfizer Covid-19 Vaccine Bivalent Booster 12/15/2020  3:51 PM 0.3 mL 09/10/2020 Intramuscular   Manufacturer: [place]   Lot: [id number]   [place]: [phone]

## 2020-12-23 ENCOUNTER — Ambulatory Visit (INDEPENDENT_AMBULATORY_CARE_PROVIDER_SITE_OTHER): Payer: No Typology Code available for payment source | Admitting: Family Medicine

## 2020-12-23 ENCOUNTER — Other Ambulatory Visit: Payer: Self-pay

## 2020-12-23 ENCOUNTER — Encounter (INDEPENDENT_AMBULATORY_CARE_PROVIDER_SITE_OTHER): Payer: Self-pay | Admitting: Family Medicine

## 2020-12-23 VITALS — BP 118/79 | HR 90 | Temp 98.2°F | Ht 65.0 in | Wt 244.0 lb

## 2020-12-23 DIAGNOSIS — E785 Hyperlipidemia, unspecified: Secondary | ICD-10-CM | POA: Diagnosis not present

## 2020-12-23 DIAGNOSIS — Z6841 Body Mass Index (BMI) 40.0 and over, adult: Secondary | ICD-10-CM | POA: Diagnosis not present

## 2020-12-23 DIAGNOSIS — I10 Essential (primary) hypertension: Secondary | ICD-10-CM | POA: Diagnosis not present

## 2020-12-24 NOTE — Progress Notes (Signed)
Chief Complaint:   OBESITY Dana Washington is here to discuss her progress with her obesity treatment plan along with follow-up of her obesity related diagnoses. Dana Washington is on the Category 3 Plan and states she is following her eating plan approximately 85% of the time. Dana Washington states she is walking more steps 7 times per week.   Today's visit was #: 8 Starting weight: 273 lbs Starting date: 08/27/2020 Today's weight: 244 lbs Today's date: 12/23/2020 Total lbs lost to date: 29 Total lbs lost since last in-office visit: 4  Interim History: Dana Washington continues to do very well with weight loss. She is very busy at work, and she is sleeping well. She is getting good support at home. She is counting her steps and averaging between 8,000-11,000 steps daily.  Subjective:   1. Hyperlipidemia, unspecified hyperlipidemia type Dana Washington is on Crestor and she is doing well with her lower cholesterol diet. No side effected noted.  2. Essential hypertension Dana Washington's blood pressure is well controlled on her diet and medications. She has no signs of hypotension.  Assessment/Plan:   1. Hyperlipidemia, unspecified hyperlipidemia type Cardiovascular risk and specific lipid/LDL goals reviewed. We discussed several lifestyle modifications today. Dana Washington will continue Crestor and diet, and we will recheck labs in 1-2 months. Orders and follow up as documented in patient record.   2. Essential hypertension Dana Washington will continue with diet and exercise, and will continue to monitor for low blood pressure as she continues her lifestyle modifications.  3. Obesity with current BMI of 40.7 Dana Washington is currently in the action stage of change. As such, her goal is to continue with weight loss efforts. She has agreed to the Category 3 Plan.   Exercise goals: As is.  Behavioral modification strategies: increasing lean protein intake, meal planning and cooking strategies, and holiday eating strategies .  Dana Washington has  agreed to follow-up with our clinic in 3 weeks. She was informed of the importance of frequent follow-up visits to maximize her success with intensive lifestyle modifications for her multiple health conditions.   Objective:   Blood pressure 118/79, pulse 90, temperature 98.2 F (36.8 C), height 5\' 5"  (1.651 m), weight 244 lb (110.7 kg), SpO2 98 %. Body mass index is 40.6 kg/m.  General: Cooperative, alert, well developed, in no acute distress. HEENT: Conjunctivae and lids unremarkable. Cardiovascular: Regular rhythm.  Lungs: Normal work of breathing. Neurologic: No focal deficits.   Lab Results  Component Value Date   CREATININE 0.84 10/21/2020   BUN 15 10/21/2020   NA 138 10/21/2020   K 4.4 10/21/2020   CL 101 10/21/2020   CO2 27 10/21/2020   Lab Results  Component Value Date   ALT 21 10/21/2020   AST 17 10/21/2020   ALKPHOS 56 09/04/2020   BILITOT 0.4 10/21/2020   Lab Results  Component Value Date   HGBA1C 5.9 (H) 08/27/2020   HGBA1C 5.5 06/19/2019   HGBA1C 5.6 04/10/2018   HGBA1C 5.4 04/01/2017   HGBA1C 5.3 09/27/2016   Lab Results  Component Value Date   INSULIN 28.0 (H) 08/27/2020   Lab Results  Component Value Date   TSH 1.24 07/18/2020   Lab Results  Component Value Date   CHOL 165 10/21/2020   HDL 56 10/21/2020   LDLCALC 87 10/21/2020   TRIG 119 10/21/2020   CHOLHDL 2.9 10/21/2020   Lab Results  Component Value Date   VD25OH 45.7 08/27/2020   VD25OH 32 06/19/2019   VD25OH 33 04/10/2018   Lab Results  Component Value Date   WBC 8.7 09/04/2020   HGB 13.9 09/04/2020   HCT 42.1 09/04/2020   MCV 87.2 09/04/2020   PLT 355 09/04/2020   No results found for: IRON, TIBC, FERRITIN  Attestation Statements:   Reviewed by clinician on day of visit: allergies, medications, problem list, medical history, surgical history, family history, social history, and previous encounter notes.  Time spent on visit including pre-visit chart review and  post-visit care and charting was 30 minutes.    I, Trixie Dredge, am acting as transcriptionist for Dennard Nip, MD.  I have reviewed the above documentation for accuracy and completeness, and I agree with the above. -  Dennard Nip, MD

## 2020-12-26 ENCOUNTER — Other Ambulatory Visit (HOSPITAL_COMMUNITY): Payer: Self-pay

## 2021-01-04 ENCOUNTER — Other Ambulatory Visit: Payer: Self-pay | Admitting: Internal Medicine

## 2021-01-05 MED ORDER — OMEPRAZOLE 20 MG PO CPDR
DELAYED_RELEASE_CAPSULE | Freq: Every day | ORAL | 3 refills | Status: DC
  Filled 2021-01-05: qty 90, 90d supply, fill #0
  Filled 2021-07-12: qty 90, 90d supply, fill #1
  Filled 2021-10-10: qty 90, 90d supply, fill #2
  Filled 2022-01-05: qty 90, 90d supply, fill #3

## 2021-01-06 ENCOUNTER — Other Ambulatory Visit (HOSPITAL_COMMUNITY): Payer: Self-pay

## 2021-01-15 ENCOUNTER — Other Ambulatory Visit: Payer: Self-pay

## 2021-01-15 ENCOUNTER — Encounter (INDEPENDENT_AMBULATORY_CARE_PROVIDER_SITE_OTHER): Payer: Self-pay | Admitting: Family Medicine

## 2021-01-15 ENCOUNTER — Ambulatory Visit (INDEPENDENT_AMBULATORY_CARE_PROVIDER_SITE_OTHER): Payer: No Typology Code available for payment source | Admitting: Family Medicine

## 2021-01-15 VITALS — BP 116/78 | Ht 65.0 in | Wt 244.0 lb

## 2021-01-15 DIAGNOSIS — Z6841 Body Mass Index (BMI) 40.0 and over, adult: Secondary | ICD-10-CM | POA: Diagnosis not present

## 2021-01-15 DIAGNOSIS — R7303 Prediabetes: Secondary | ICD-10-CM | POA: Diagnosis not present

## 2021-01-19 ENCOUNTER — Other Ambulatory Visit (HOSPITAL_COMMUNITY): Payer: Self-pay

## 2021-01-19 NOTE — Progress Notes (Signed)
Chief Complaint:   OBESITY Cynthea is here to discuss her progress with her obesity treatment plan along with follow-up of her obesity related diagnoses. Libbie is on the Category 3 Plan and states she is following her eating plan approximately 70-75% of the time. Anesha states she is doing 0 minutes 0 times per week.  Today's visit was #: 9 Starting weight: 273 lbs Starting date: 08/27/2020 Today's weight: 244 lbs Today's date: 01/15/2021 Total lbs lost to date: 29 Total lbs lost since last in-office visit: 0  Interim History: Odessa has done well with maintaining her weight over Christmas, even while on vacation on a cruise. Her hunger is controlled and she has been staying very active even without formal exercise.   Subjective:   1. Pre-diabetes Jelicia is working on diet and weight loss, and she is doing very well. She has no signs of hypoglycemia.   Assessment/Plan:   1. Pre-diabetes Yunique will continue with diet, exercise, and decreasing simple carbohydrates to help decrease the risk of diabetes. We will recheck labs in 1 month.  2. Obesity, current BMI 40.6 Clarene is currently in the action stage of change. As such, her goal is to continue with weight loss efforts. She has agreed to the Category 3 Plan.   Exercise goals: All adults should avoid inactivity. Some physical activity is better than none, and adults who participate in any amount of physical activity gain some health benefits.  Behavioral modification strategies: increasing lean protein intake.  Nakhia has agreed to follow-up with our clinic in 3 weeks. She was informed of the importance of frequent follow-up visits to maximize her success with intensive lifestyle modifications for her multiple health conditions.   Objective:   Blood pressure 116/78, height 5\' 5"  (1.651 m), weight 244 lb (110.7 kg). Body mass index is 40.6 kg/m.  General: Cooperative, alert, well developed, in no acute  distress. HEENT: Conjunctivae and lids unremarkable. Cardiovascular: Regular rhythm.  Lungs: Normal work of breathing. Neurologic: No focal deficits.   Lab Results  Component Value Date   CREATININE 0.84 10/21/2020   BUN 15 10/21/2020   NA 138 10/21/2020   K 4.4 10/21/2020   CL 101 10/21/2020   CO2 27 10/21/2020   Lab Results  Component Value Date   ALT 21 10/21/2020   AST 17 10/21/2020   ALKPHOS 56 09/04/2020   BILITOT 0.4 10/21/2020   Lab Results  Component Value Date   HGBA1C 5.9 (H) 08/27/2020   HGBA1C 5.5 06/19/2019   HGBA1C 5.6 04/10/2018   HGBA1C 5.4 04/01/2017   HGBA1C 5.3 09/27/2016   Lab Results  Component Value Date   INSULIN 28.0 (H) 08/27/2020   Lab Results  Component Value Date   TSH 1.24 07/18/2020   Lab Results  Component Value Date   CHOL 165 10/21/2020   HDL 56 10/21/2020   LDLCALC 87 10/21/2020   TRIG 119 10/21/2020   CHOLHDL 2.9 10/21/2020   Lab Results  Component Value Date   VD25OH 45.7 08/27/2020   VD25OH 32 06/19/2019   VD25OH 33 04/10/2018   Lab Results  Component Value Date   WBC 8.7 09/04/2020   HGB 13.9 09/04/2020   HCT 42.1 09/04/2020   MCV 87.2 09/04/2020   PLT 355 09/04/2020   No results found for: IRON, TIBC, FERRITIN  Attestation Statements:   Reviewed by clinician on day of visit: allergies, medications, problem list, medical history, surgical history, family history, social history, and previous encounter notes.  Time spent on visit including pre-visit chart review and post-visit care and charting was 30 minutes.    I, Trixie Dredge, am acting as transcriptionist for Dennard Nip, MD.  I have reviewed the above documentation for accuracy and completeness, and I agree with the above. -  Dennard Nip, MD

## 2021-02-04 ENCOUNTER — Encounter (INDEPENDENT_AMBULATORY_CARE_PROVIDER_SITE_OTHER): Payer: Self-pay | Admitting: Family Medicine

## 2021-02-04 ENCOUNTER — Telehealth (INDEPENDENT_AMBULATORY_CARE_PROVIDER_SITE_OTHER): Payer: No Typology Code available for payment source | Admitting: Family Medicine

## 2021-02-04 DIAGNOSIS — E669 Obesity, unspecified: Secondary | ICD-10-CM

## 2021-02-04 DIAGNOSIS — Z6841 Body Mass Index (BMI) 40.0 and over, adult: Secondary | ICD-10-CM | POA: Diagnosis not present

## 2021-02-04 DIAGNOSIS — K529 Noninfective gastroenteritis and colitis, unspecified: Secondary | ICD-10-CM | POA: Diagnosis not present

## 2021-02-04 NOTE — Progress Notes (Signed)
TeleHealth Visit:  Due to the COVID-19 pandemic, this visit was completed with telemedicine (audio/video) technology to reduce patient and provider exposure as well as to preserve personal protective equipment.   [name] has verbally consented to this TeleHealth visit. The patient is located at home, the provider is located at the [facility] office. The participants in this visit include the listed provider and patient. The visit was conducted today via MyChart video.   Chief Complaint: OBESITY Dana Washington is here to discuss her progress with her obesity treatment plan along with follow-up of her obesity related diagnoses. Dana Washington is on the Category 3 Plan and states she is following her eating plan approximately 70% of the time. Dana Washington states she was doing leg lifts and cycling.   Today's visit was #: 10 Starting weight: 273 lbs Starting date: 08/27/2020  Interim History: Dana Washington changed her visit to virtual due to GI symptoms for 3 days. Before this, she was doing well on her modified Category 3 plan, and she has continued to lose weight. Her hunger is mostly controlled and she does well with her food routine without feeling bored.  Subjective:   1. Gastroenteritis Dana Washington has had 3 days of watery diarrhea. She had 1 episode of vomiting and she has been working on keeping down liquids. She has drank some Propel electrolyte drinks in the meanwhile. No fever or cough.  Assessment/Plan:   1. Gastroenteritis Dana Washington was encouraged to continue to drink both water and electrolytes to help prevent electrolyte imbalance. She is to slowly work back to her eating plan as tolerated, and she will contact her primary care physician if she is not feeling better in the next 1-2 days.  2. Obesity with current BMI of 40.7 Dana Washington is currently in the action stage of change. As such, her goal is to continue with weight loss efforts. She has agreed to the Category 3 Plan.   Dana Washington will  get back to her plan more closely as she feels better and she is able to tolerate regular food.  Behavioral modification strategies: meal planning and cooking strategies.  Dana Washington has agreed to follow-up with our clinic in 3 weeks. She was informed of the importance of frequent follow-up visits to maximize her success with intensive lifestyle modifications for her multiple health conditions.  Objective:   VITALS: Per patient if applicable, see vitals. GENERAL: Alert and in no acute distress. CARDIOPULMONARY: No increased WOB. Speaking in clear sentences.  PSYCH: Pleasant and cooperative. Speech normal rate and rhythm. Affect is appropriate. Insight and judgement are appropriate. Attention is focused, linear, and appropriate.  NEURO: Oriented as arrived to appointment on time with no prompting.   Lab Results  Component Value Date   CREATININE 0.84 10/21/2020   BUN 15 10/21/2020   NA 138 10/21/2020   K 4.4 10/21/2020   CL 101 10/21/2020   CO2 27 10/21/2020   Lab Results  Component Value Date   ALT 21 10/21/2020   AST 17 10/21/2020   ALKPHOS 56 09/04/2020   BILITOT 0.4 10/21/2020   Lab Results  Component Value Date   HGBA1C 5.9 (H) 08/27/2020   HGBA1C 5.5 06/19/2019   HGBA1C 5.6 04/10/2018   HGBA1C 5.4 04/01/2017   HGBA1C 5.3 09/27/2016   Lab Results  Component Value Date   INSULIN 28.0 (H) 08/27/2020   Lab Results  Component Value Date   TSH 1.24 07/18/2020   Lab Results  Component Value Date   CHOL 165 10/21/2020  HDL 56 10/21/2020   LDLCALC 87 10/21/2020   TRIG 119 10/21/2020   CHOLHDL 2.9 10/21/2020   Lab Results  Component Value Date   VD25OH 45.7 08/27/2020   VD25OH 32 06/19/2019   VD25OH 33 04/10/2018   Lab Results  Component Value Date   WBC 8.7 09/04/2020   HGB 13.9 09/04/2020   HCT 42.1 09/04/2020   MCV 87.2 09/04/2020   PLT 355 09/04/2020   No results found for: IRON, TIBC, FERRITIN  Attestation Statements:   Reviewed by clinician on  day of visit: allergies, medications, problem list, medical history, surgical history, family history, social history, and previous encounter notes.   I, Trixie Dredge, am acting as transcriptionist for Dennard Nip, MD.  I have reviewed the above documentation for accuracy and completeness, and I agree with the above. - Dennard Nip, MD

## 2021-02-07 ENCOUNTER — Telehealth: Payer: No Typology Code available for payment source | Admitting: Emergency Medicine

## 2021-02-07 ENCOUNTER — Encounter: Payer: Self-pay | Admitting: Emergency Medicine

## 2021-02-07 DIAGNOSIS — R197 Diarrhea, unspecified: Secondary | ICD-10-CM

## 2021-02-07 NOTE — Progress Notes (Signed)
Hi Dana Washington,  Sorry you are not feeling well! It is concerning you are having diarrhea that is watery and has been ongoing for almost a week!  It sounds like it could have initially been food poisoning, but most of the time that resolves after about 48 hours.  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  If you are having associated abdominal pain there are several other things (such as diverticulitis, or inflammation of your colon) that may also be causing your symptoms.  With all that being said, I believe your condition warrants a further work-up in person at this time.  Sorry we are unable to help!  Based on what you shared with me, I feel your condition warrants further evaluation and I recommend that you be seen in a face to face visit.   NOTE: There will be NO CHARGE for this eVisit   If you are having a true medical emergency please call 911.      For an urgent face to face visit, [facility] has six urgent care centers for your convenience:     [facility] [place] [facility] [place] Get Driving Directions [phone] [place], [place] [address]    [facility]) Get Driving Directions [phone] [place], [place] [address]  [facility] [place] ([place]) Get Driving Directions [phone] [address] [place],  [place]  [address]  [facility] Get Driving Directions [phone] [place], [place], [place] [address]   [facility] Get Driving Directions  [phone] [address], [place] [address]   [facility] [place] Get Driving Directions [phone] [address] [place], [place] [address]  Your MyChart E-visit questionnaire answers were reviewed by a board  certified advanced clinical practitioner to complete your personal care plan based on your specific symptoms.  Thank you for using e-Visits.

## 2021-02-08 ENCOUNTER — Other Ambulatory Visit: Payer: Self-pay

## 2021-02-08 ENCOUNTER — Emergency Department (HOSPITAL_BASED_OUTPATIENT_CLINIC_OR_DEPARTMENT_OTHER)
Admission: EM | Admit: 2021-02-08 | Discharge: 2021-02-08 | Disposition: A | Discharge status: Home / Self Care | Payer: No Typology Code available for payment source | Attending: Emergency Medicine | Admitting: Emergency Medicine

## 2021-02-08 ENCOUNTER — Emergency Department (HOSPITAL_BASED_OUTPATIENT_CLINIC_OR_DEPARTMENT_OTHER): Payer: No Typology Code available for payment source

## 2021-02-08 ENCOUNTER — Encounter (HOSPITAL_BASED_OUTPATIENT_CLINIC_OR_DEPARTMENT_OTHER): Payer: Self-pay

## 2021-02-08 DIAGNOSIS — E876 Hypokalemia: Secondary | ICD-10-CM | POA: Insufficient documentation

## 2021-02-08 DIAGNOSIS — R748 Abnormal levels of other serum enzymes: Secondary | ICD-10-CM | POA: Insufficient documentation

## 2021-02-08 DIAGNOSIS — R000 Tachycardia, unspecified: Secondary | ICD-10-CM | POA: Diagnosis not present

## 2021-02-08 DIAGNOSIS — K529 Noninfective gastroenteritis and colitis, unspecified: Secondary | ICD-10-CM | POA: Diagnosis not present

## 2021-02-08 DIAGNOSIS — R197 Diarrhea, unspecified: Secondary | ICD-10-CM | POA: Diagnosis present

## 2021-02-08 LAB — COMPREHENSIVE METABOLIC PANEL
ALT: 19 U/L (ref 0–44)
AST: 23 U/L (ref 15–41)
Albumin: 4.2 g/dL (ref 3.5–5.0)
Alkaline Phosphatase: 54 U/L (ref 38–126)
Anion gap: 13 (ref 5–15)
BUN: 12 mg/dL (ref 6–20)
CO2: 25 mmol/L (ref 22–32)
Calcium: 9.3 mg/dL (ref 8.9–10.3)
Chloride: 98 mmol/L (ref 98–111)
Creatinine, Ser: 0.9 mg/dL (ref 0.44–1.00)
GFR, Estimated: >60 mL/min (ref >60)
Glucose, Bld: 113 mg/dL — ABNORMAL HIGH (ref 70–99)
Potassium: 2.8 mmol/L — ABNORMAL LOW (ref 3.5–5.1)
Sodium: 136 mmol/L (ref 135–145)
Total Bilirubin: 0.4 mg/dL (ref 0.3–1.2)
Total Protein: 7.6 g/dL (ref 6.5–8.1)

## 2021-02-08 LAB — CBC
HCT: 45.4 % (ref 36.0–46.0)
Hemoglobin: 15.5 g/dL — ABNORMAL HIGH (ref 12.0–15.0)
MCH: 28.9 pg (ref 26.0–34.0)
MCHC: 34.1 g/dL (ref 30.0–36.0)
MCV: 84.5 fL (ref 80.0–100.0)
Platelets: 416 10*3/uL — ABNORMAL HIGH (ref 150–400)
RBC: 5.37 MIL/uL — ABNORMAL HIGH (ref 3.87–5.11)
RDW: 14.1 % (ref 11.5–15.5)
WBC: 8 10*3/uL (ref 4.0–10.5)
nRBC: 0 % (ref 0.0–0.2)

## 2021-02-08 LAB — URINALYSIS, ROUTINE W REFLEX MICROSCOPIC
Bilirubin Urine: NEGATIVE
Glucose, UA: NEGATIVE mg/dL
Ketones, ur: NEGATIVE mg/dL
Leukocytes,Ua: NEGATIVE
Nitrite: NEGATIVE
Specific Gravity, Urine: 1.011 (ref 1.005–1.030)
pH: 5.5 (ref 5.0–8.0)

## 2021-02-08 LAB — PREGNANCY, URINE: Preg Test, Ur: NEGATIVE

## 2021-02-08 LAB — LIPASE, BLOOD: Lipase: 76 U/L — ABNORMAL HIGH (ref 11–51)

## 2021-02-08 MED ORDER — LACTATED RINGERS IV BOLUS
1000.0000 mL | Freq: Once | INTRAVENOUS | Status: AC
  Administered 2021-02-08: 1000 mL via INTRAVENOUS

## 2021-02-08 MED ORDER — POTASSIUM CHLORIDE CRYS ER 20 MEQ PO TBCR
40.0000 meq | EXTENDED_RELEASE_TABLET | Freq: Once | ORAL | Status: AC
  Administered 2021-02-08: 40 meq via ORAL
  Filled 2021-02-08: qty 2

## 2021-02-08 MED ORDER — IOHEXOL 300 MG/ML  SOLN
100.0000 mL | Freq: Once | INTRAMUSCULAR | Status: AC | PRN
  Administered 2021-02-08: 100 mL via INTRAVENOUS

## 2021-02-08 NOTE — Discharge Instructions (Addendum)
Your work-up today was overall reassuring.  It does appear that you have some mild dehydration, however we gave you some IV fluids here today to help with that.  We also gave you some potassium since this was low as well.  Your CT shows that you have some inflammation of your bowel consistent with colitis.  This is likely from a "stomach bug "similar to what you assumed.  It should resolve in the next few days.  Please continue to drink plenty of water.  Follow-up with your PCP in a couple of days if you are continuing to have diarrhea.  Return to the ED if you have any fainting, palpitations or chest pain.

## 2021-02-08 NOTE — ED Provider Notes (Signed)
[facility] EMERGENCY DEPT Provider Note   CSN: [id number] Arrival date & time: 02/08/21  1157     History  Chief Complaint  Patient presents with   Diarrhea    Dana Washington is a 46 y.o. female who presents to the ED for evaluation of diarrhea for over 1 week.  Patient states that she works for [employer] and knows that norovirus is going around and assumed that this was the cause.  However patient has been having 4-5 episodes of diarrhea per hour every day for about 1 week.  She denies blood and describes the diarrhea as watery.  No rectal pain. She has been having difficulty keeping up with her fluid and hydration.  She talked with her PCP who expected symptoms to have resolved a couple of days ago.  Patient has taken 1 Imodium without any relief.  She endorses some abdominal pain described as cramping that she experiences just before she needs to have a bowel movement.  She states that at the beginning of her symptoms, pain was in the epigastric region and over time seems to have slowly moved downward and is now more in her suprapubic area.  She denies urinary symptoms, fever, nausea.  She had 1 episode of vomiting 1 week ago but none since then.   Diarrhea Associated symptoms: abdominal pain   Associated symptoms: no fever, no headaches and no vomiting       Home Medications Prior to Admission medications   Medication Sig Start Date End Date Taking? Authorizing Provider  albuterol (VENTOLIN HFA) 108 (90 Base) MCG/ACT inhaler Inhale 2 puffs into the lungs every 6 (six) hours as needed for wheezing or shortness of breath. 10/30/20   Elby Showers, MD  benazepril-hydrochlorthiazide (LOTENSIN HCT) 20-12.5 MG tablet TAKE 1 TABLET BY MOUTH ONCE DAILY 08/24/20 08/24/21  Elby Showers, MD  COVID-19 mRNA bivalent vaccine, Pfizer, (PFIZER COVID-19 VAC BIVALENT) injection Inject into the muscle. 12/15/20   Carlyle Basques, MD  FLUoxetine (PROZAC) 40 MG capsule Take 1 capsule (40  mg total) by mouth daily. 07/22/20   Elby Showers, MD  fluticasone-salmeterol (ADVAIR DISKUS) 250-50 MCG/ACT AEPB Inhale 1 puff into the lungs in the morning and at bedtime. 10/30/20   Elby Showers, MD  norethindrone-ethinyl estradiol-iron (BLISOVI FE 1.5/30) 1.5-30 MG-MCG tablet TAKE 1 TABLET BY MOUTH ONCE DAILY 10/13/20     omeprazole (PRILOSEC) 20 MG capsule TAKE 1 CAPSULE BY MOUTH ONCE DAILY 01/05/21 01/05/22  Elby Showers, MD  rosuvastatin (CRESTOR) 20 MG tablet Take 1 tablet (20 mg total) by mouth daily. 07/22/20   Elby Showers, MD  SYNTHROID 100 MCG tablet TAKE 1 TABLET BY MOUTH ONCE DAILY 12/06/20 12/06/21  Elby Showers, MD  topiramate (TOPAMAX) 100 MG tablet TAKE 1 TABLET BY MOUTH EVERY EVENING AT BEDTIME 10/27/20 10/27/21  Elby Showers, MD      Allergies    Bee venom    Review of Systems   Review of Systems  Constitutional:  Negative for fever.  HENT: Negative.    Eyes: Negative.   Respiratory:  Negative for shortness of breath.   Cardiovascular: Negative.   Gastrointestinal:  Positive for abdominal pain and diarrhea. Negative for vomiting.  Endocrine: Negative.   Genitourinary: Negative.   Musculoskeletal: Negative.   Skin:  Negative for rash.  Neurological:  Negative for headaches.  All other systems reviewed and are negative.  Physical Exam Updated Vital Signs BP 118/89 (BP Location: Right Arm)  Pulse (!) 114    Temp 97.8 F (36.6 C)    Resp 19    LMP 02/01/2021 (Approximate)    SpO2 98%  Physical Exam Vitals and nursing note reviewed.  Constitutional:      General: She is not in acute distress.    Appearance: She is not ill-appearing.  HENT:     Head: Atraumatic.  Eyes:     Conjunctiva/sclera: Conjunctivae normal.  Cardiovascular:     Rate and Rhythm: Regular rhythm. Tachycardia present.     Pulses: Normal pulses.     Heart sounds: No murmur heard. Pulmonary:     Effort: Pulmonary effort is normal. No respiratory distress.     Breath sounds:  Normal breath sounds.  Abdominal:     General: There is no distension.     Palpations: Abdomen is soft.     Tenderness: There is no abdominal tenderness.     Comments: Abdomen is soft, nondistended.  She is nontender to palpation in all quadrants.  Negative Murphy sign, negative mcBurney.  Negative CVA tenderness bilaterally.  Musculoskeletal:        General: Normal range of motion.     Cervical back: Normal range of motion.  Skin:    General: Skin is warm and dry.     Capillary Refill: Capillary refill takes less than 2 seconds.  Neurological:     General: No focal deficit present.     Mental Status: She is alert.  Psychiatric:        Mood and Affect: Mood normal.    ED Results / Procedures / Treatments   Labs (all labs ordered are listed, but only abnormal results are displayed) Labs Reviewed  CBC - Abnormal; Notable for the following components:      Result Value   RBC 5.37 (*)    Hemoglobin 15.5 (*)    Platelets 416 (*)    All other components within normal limits  PREGNANCY, URINE  LIPASE, BLOOD  COMPREHENSIVE METABOLIC PANEL  URINALYSIS, ROUTINE W REFLEX MICROSCOPIC    EKG None  Radiology No results found.  Procedures Procedures    Medications Ordered in ED Medications - No data to display  ED Course/ Medical Decision Making/ A&P Clinical Course as of 02/08/21 1523  Sun Feb 08, 2021  1447 Comprehensive metabolic panel(!) Potassium low at 2.8.  Will supplement 40 meq potassium p.o. [EC]  1448 CBC(!) CBC without leukocytosis.  Blood appears concentrated [EC]  1448 Pregnancy, urine Pregnancy negative [EC]  1448 Lipase, blood(!) Lipase elevated [EC]  1448 Urinalysis, Routine w reflex microscopic Urine, Clean Catch(!) UA with signs of early infection [EC]  59 CT ABDOMEN PELVIS W CONTRAST Ct abdomen without evidence of pancreatitis, although it is significant for colitis [EC]    Clinical Course User Index [EC] Tonye Pearson, PA-C                            Medical Decision Making Amount and/or Complexity of Data Reviewed Labs: ordered. Decision-making details documented in ED Course. Radiology: ordered. Decision-making details documented in ED Course.  Risk Prescription drug management.   History:  Dana Washington is a 46 y.o. female who presents to the ED for evaluation of diarrhea for over 1 week.  Patient states that she works for [employer] and knows that norovirus is going around and assumed that this was the cause.  However patient has been having 4-5 episodes of diarrhea per hour  every day for about 1 week.  She denies blood and describes the diarrhea as watery.  No rectal pain. She has been having difficulty keeping up with her fluid and hydration.  She talked with her PCP who expected symptoms to have resolved a couple of days ago.  Patient has taken 1 Imodium without any relief.  She endorses some abdominal pain described as cramping that she experiences just before she needs to have a bowel movement.  She states that at the beginning of her symptoms, pain was in the epigastric region and over time seems to have slowly moved downward and is now more in her suprapubic area.  She denies urinary symptoms, fever, nausea.  She had 1 episode of vomiting 1 week ago but none since then. External records from outside source obtained and reviewed including family medical records about her gastroenteritis from 02/04/2021 Details: Patient had video visit diagnosed with gastroenteritis.  She was provided reassurance and instructed to hydrate.  After symptoms persisted for over a week, they recommended further in person evaluation. This patient presents to the ED for concern of diarrhea, this involves an extensive number of treatment options, and is a complaint that carries with it a high risk of complications and morbidity.   Differentials include parasite, diverticulitis, food poisoning  Initial impression:  Patient is pleasant,  nontoxic-appearing.  She is tachycardic during evaluation.  Abdominal exam is normal, nontender to palpation in all quadrants.  We will obtain CBC, UA, pregnancy CMP and lipase.  We will also provide fluids and obtain CT abdomen.  Lab Tests and EKG:  I Ordered, reviewed, and interpreted labs and EKG.  The pertinent results in my decision-making regarding them are detailed in the ED course and/or initial impression section above.   Imaging Studies ordered:  I ordered imaging studies including CT abdomen pelvis I independently visualized and interpreted imaging and I agree with the radiologist interpretation. Decisions made regarding results are detailed in the ED course and/or initial impression section above.   Cardiac Monitoring:  The patient was maintained on a cardiac monitor.  I personally viewed and interpreted the cardiac monitored which showed an underlying rhythm of: NSR   Medicines ordered and prescription drug management:  I ordered medication including: 1 L lactated ringer bolus for dehydration  40 MeQ potassium p.o. for hypokalemia Reevaluation of the patient after these medicines showed that the patient improved I have reviewed the patients home medicines and have made adjustments as needed  Disposition:  After consideration of the diagnostic results, physical exam, history and the patients response to treatment feel that the patent would benefit from discharge with strict return precautions.   Colitis: Concern for C. difficile is low given patient's normal white count.  Lipase is likely elevated due to to excessive amount of diarrhea.  Bacteria seen in UA more likely from contamination.  She not having any urinary symptoms.  Encourage patient to keep drinking plenty of fluids.  We discussed return precautions.  She is to follow-up with PCP in few days if symptoms not resolved.  All imaging labs were discussed with patient.  All questions asked and answered.  Patient was  discharged home in good condition.  Final Clinical Impression(s) / ED Diagnoses Final diagnoses:  Colitis    Rx / DC Orders ED Discharge Orders     None         Tonye Pearson, PA-C 02/08/21 Kinder, DO 02/09/21 1729

## 2021-02-09 ENCOUNTER — Telehealth: Payer: Self-pay

## 2021-02-09 NOTE — Telephone Encounter (Signed)
Dana Showers, Dana Washington  Angus Seller, CMA She is going to need stool studies to determine the cause of this diarrhea. We have those containers in the lab. What I usually order is 1) Ova and parasites, 2) stool culture, and 3) stool for WBCs as well as 4) C.diff  Spoke with the patient and she states that her diarrhea stopped last night. After speaking with Dr Renold Genta she has been advised to call if it starts again so we can treat it.

## 2021-02-13 ENCOUNTER — Ambulatory Visit (AMBULATORY_SURGERY_CENTER): Payer: Self-pay

## 2021-02-13 ENCOUNTER — Other Ambulatory Visit (HOSPITAL_COMMUNITY): Payer: Self-pay

## 2021-02-13 ENCOUNTER — Other Ambulatory Visit: Payer: Self-pay

## 2021-02-13 VITALS — Ht 66.0 in | Wt 234.0 lb

## 2021-02-13 DIAGNOSIS — Z1211 Encounter for screening for malignant neoplasm of colon: Secondary | ICD-10-CM

## 2021-02-13 MED ORDER — NA SULFATE-K SULFATE-MG SULF 17.5-3.13-1.6 GM/177ML PO SOLN
1.0000 | Freq: Once | ORAL | 0 refills | Status: AC
  Filled 2021-02-13: qty 354, 1d supply, fill #0

## 2021-02-13 NOTE — Progress Notes (Signed)
Denies allergies to eggs or soy products. Denies complication of anesthesia or sedation. Denies use of weight loss medication. Denies use of O2.   Emmi instructions given for colonoscopy.  

## 2021-02-17 ENCOUNTER — Telehealth: Payer: Self-pay | Admitting: Internal Medicine

## 2021-02-17 NOTE — Telephone Encounter (Signed)
Instructed pt to call her PCP and make an OV until I here from Dr Carlean Purl and his advice.

## 2021-02-17 NOTE — Telephone Encounter (Signed)
Patient called this morning.  She has an upcoming procedure with Dr. Carlean Purl on 2/17.  She went to the ER last week and feels like she is having a "self-diagnosed" colitis flare-up.  She said she feels like her insides are falling out.  She wanted to know if she should try to go to her PCP or just wait for the procedure and, if so, what she should do in the interim.  Please call patient and advise.  Thank you.

## 2021-02-17 NOTE — Telephone Encounter (Signed)
Elisheba Conigliaro [phone]  Chelbi called to say she is now having cramping, bloody mucus, constipation, she called Dr Celesta Aver office, has not heard back from them yet. She has colonoscopy scheduled for next Friday. She had thought she was getting better.

## 2021-02-17 NOTE — Telephone Encounter (Signed)
02-02-21 felt bad- watery diarrhea x 3 days- thought stomach virus- e- visit done saturday - went to ED Sunday - did CT said colitis, inflammation in colon-  IMPRESSION: 1. Mild wall thickening of the distal transverse and proximal descending colon with mild pericolonic fat stranding, suggestive of an infectious or inflammatory colitis. 2. Liquid stool within the colon with scattered air-fluid levels, suggestive of a diarrheal state.  watery diarrhea 2-3 x an hour- Monday night better- now rabbit  pellet BM's, bloody mucus- horrible cramping - pt asked if she should see her  PCP- or is she needs to see [place] first  - her colon 02-27-2021   please advise- thanks Lelan Pons PV

## 2021-02-17 NOTE — Telephone Encounter (Signed)
Called patient to let her know what DR Renold Genta said, she will call if anything changes, she verbalized understanding.

## 2021-02-18 NOTE — Telephone Encounter (Signed)
She did not have any stool studies at ED  Remo Lipps,  She needs stool C diff PCR, GI pathogen profile and and calprotectin done as well - unless the diarrhea has resolved  Need to understand if it is infectious before she has a colonoscopy

## 2021-02-19 ENCOUNTER — Other Ambulatory Visit (HOSPITAL_COMMUNITY): Payer: Self-pay

## 2021-02-19 NOTE — Telephone Encounter (Signed)
She just got off the phone with Dr Carlean Purl. She explained that her diarrhea stopped after ED but she went to extreme constipation. She had some bloody mucous stools but it has gone away with clear liquid diet. She states that there is still mucous. She is waiting to hear back from Dr Carlean Purl on what he wants her to do.

## 2021-02-19 NOTE — Telephone Encounter (Signed)
Pt states that she is NOT  having anymore Diarrhea; States that the diarrhea has resolved and now is having constipation. Pt stated that the diarrhea resolved on the 02/09/2021 and then she began to have blood and Mucus come from her Rectum. ( Not Mixed with Stool)  Pt stated that she has been on a clear liquid diet for 24 hours.  Please advise

## 2021-02-19 NOTE — Telephone Encounter (Signed)
Left message for patient to return call to office at [phone].  

## 2021-02-19 NOTE — Telephone Encounter (Signed)
Start Miralax 1 dose bid and go up or down to try to have more regular defecation  Max 4 doses/day  Advance diet as tolerated  Keep colonoscopy appointment as scheduled

## 2021-02-20 NOTE — Telephone Encounter (Signed)
Pt made aware of Dr. Gessner recommendations: Pt verbalized understanding with all questions answered.   

## 2021-02-26 ENCOUNTER — Other Ambulatory Visit: Payer: Self-pay

## 2021-02-26 ENCOUNTER — Ambulatory Visit (INDEPENDENT_AMBULATORY_CARE_PROVIDER_SITE_OTHER): Payer: No Typology Code available for payment source | Admitting: Family Medicine

## 2021-02-26 ENCOUNTER — Encounter (INDEPENDENT_AMBULATORY_CARE_PROVIDER_SITE_OTHER): Payer: Self-pay | Admitting: Family Medicine

## 2021-02-26 VITALS — BP 107/76 | HR 89 | Ht 66.0 in | Wt 238.0 lb

## 2021-02-26 DIAGNOSIS — E669 Obesity, unspecified: Secondary | ICD-10-CM

## 2021-02-26 DIAGNOSIS — Z6839 Body mass index (BMI) 39.0-39.9, adult: Secondary | ICD-10-CM

## 2021-02-26 DIAGNOSIS — I10 Essential (primary) hypertension: Secondary | ICD-10-CM

## 2021-02-27 ENCOUNTER — Ambulatory Visit (AMBULATORY_SURGERY_CENTER): Payer: No Typology Code available for payment source | Admitting: Internal Medicine

## 2021-02-27 ENCOUNTER — Encounter: Payer: Self-pay | Admitting: Internal Medicine

## 2021-02-27 VITALS — BP 98/64 | HR 67 | Temp 98.6°F | Resp 22 | Ht 65.0 in | Wt 234.0 lb

## 2021-02-27 DIAGNOSIS — D122 Benign neoplasm of ascending colon: Secondary | ICD-10-CM

## 2021-02-27 DIAGNOSIS — Z1211 Encounter for screening for malignant neoplasm of colon: Secondary | ICD-10-CM

## 2021-02-27 DIAGNOSIS — K635 Polyp of colon: Secondary | ICD-10-CM | POA: Diagnosis not present

## 2021-02-27 MED ORDER — SODIUM CHLORIDE 0.9 % IV SOLN: 500.0000 mL | INTRAVENOUS | Status: DC

## 2021-02-27 NOTE — Progress Notes (Signed)
VSS, transported to PACU °

## 2021-02-27 NOTE — Progress Notes (Signed)
Pt's states no medical or surgical changes since previsit or office visit. 

## 2021-02-27 NOTE — Progress Notes (Signed)
[facility] History and Physical   Primary Care Physician:  Elby Showers, MD   Reason for Procedure:   CRCA screen  Plan:    colonoscopy     HPI: Dana Washington is a 46 y.o. female for screening colonoscopy. She did recently suffer an episode of colitis (CT and ED eval) which has subsided.   Past Medical History:  Diagnosis Date   Allergy    Anxiety    Constipation    Diabetes mellitus without complication (HCC)    Fundic gland polyps of stomach, benign    GERD (gastroesophageal reflux disease)    Glucose intolerance (impaired glucose tolerance)    Herpes dermatitis    History of motion sickness    Hx of Hashimoto thyroiditis    Hyperlipidemia    Hypertension    Hypothyroidism, secondary    Impingement syndrome of right shoulder    Insomnia    Metabolic syndrome    Migraine    OA (osteoarthritis)    RIGHT SHOULDER AC JOINT   Obese    OCD (obsessive compulsive disorder)    PONV (postoperative nausea and vomiting)    SEVERE after breast reduction   TMJ (dislocation of temporomandibular joint)    Vitamin D deficiency    Vitamin D deficiency     Past Surgical History:  Procedure Laterality Date   biopsy of lymph node  FEB 2010   BENIGN   BREAST BIOPSY Bilateral    BREAST EXCISIONAL BIOPSY Left    benign   BREAST REDUCTION SURGERY Bilateral MAY 2010   CARPAL TUNNEL RELEASE Bilateral 2012   CHONDROPLASTY Left 10/25/2017   Procedure: CHONDROPLASTY;  Surgeon: Sydnee Cabal, MD;  Location: [facility];  Service: Orthopedics;  Laterality: Left;   EXCISION MASS ABDOMINAL N/A 04/14/2017   Procedure: EXCISION OF SUBCUTANEOUS LIPOMA OF LEFT UPPER ABDOMINAL WALL;  Surgeon: Donnie Mesa, MD;  Location: [facility];  Service: General;  Laterality: N/A;   KNEE ARTHROSCOPY WITH MEDIAL MENISECTOMY Left 10/25/2017   Procedure: LEFT KNEE ARTHROSCOPY WITH MEDIAL AND LATERAL MENISECTOMY;  Surgeon: Sydnee Cabal, MD;  Location:  [facility];  Service: Orthopedics;  Laterality: Left;   LASIK  2008   REDUCTION MAMMAPLASTY Bilateral    SHOULDER ARTHROSCOPY WITH ROTATOR CUFF REPAIR AND SUBACROMIAL DECOMPRESSION Right 09/19/2012   Procedure: RIGHT SHOULDER EXAMINE UNDER ANESTHESIA, ARTHROSCOPY,  DEBRIDEMENT, SUBACROMIAL DECOMPRESSION, DISTAL CLAVICLE RESECTION AND  ROTATOR CUFF REPAIR, LABRAL REPAIR ;  Surgeon: Sydnee Cabal, MD;  Location: [place] [facility];  Service: Orthopedics;  Laterality: Right;   WISDOM TOOTH EXTRACTION  AGE 19    Prior to Admission medications   Medication Sig Start Date End Date Taking? Authorizing Provider  benazepril-hydrochlorthiazide (LOTENSIN HCT) 20-12.5 MG tablet TAKE 1 TABLET BY MOUTH ONCE DAILY 08/24/20 08/24/21 Yes Baxley, Cresenciano Lick, MD  FLUoxetine (PROZAC) 40 MG capsule Take 1 capsule (40 mg total) by mouth daily. 07/22/20  Yes Baxley, Cresenciano Lick, MD  norethindrone-ethinyl estradiol-iron (BLISOVI FE 1.5/30) 1.5-30 MG-MCG tablet TAKE 1 TABLET BY MOUTH ONCE DAILY 10/13/20  Yes   omeprazole (PRILOSEC) 20 MG capsule TAKE 1 CAPSULE BY MOUTH ONCE DAILY 01/05/21 01/05/22 Yes Baxley, Cresenciano Lick, MD  rosuvastatin (CRESTOR) 20 MG tablet Take 1 tablet (20 mg total) by mouth daily. 07/22/20  Yes Elby Showers, MD  SYNTHROID 100 MCG tablet TAKE 1 TABLET BY MOUTH ONCE DAILY 12/06/20 12/06/21 Yes Elby Showers, MD  topiramate (TOPAMAX) 100 MG tablet TAKE 1 TABLET BY  MOUTH EVERY EVENING AT BEDTIME 10/27/20 10/27/21 Yes Baxley, Cresenciano Lick, MD  COVID-19 mRNA bivalent vaccine, Pfizer, (PFIZER COVID-19 VAC BIVALENT) injection Inject into the muscle. 12/15/20   Carlyle Basques, MD    Current Outpatient Medications  Medication Sig Dispense Refill   benazepril-hydrochlorthiazide (LOTENSIN HCT) 20-12.5 MG tablet TAKE 1 TABLET BY MOUTH ONCE DAILY 90 tablet 3   FLUoxetine (PROZAC) 40 MG capsule Take 1 capsule (40 mg total) by mouth daily. 90 capsule 3   norethindrone-ethinyl estradiol-iron (BLISOVI FE  1.5/30) 1.5-30 MG-MCG tablet TAKE 1 TABLET BY MOUTH ONCE DAILY 84 tablet 4   omeprazole (PRILOSEC) 20 MG capsule TAKE 1 CAPSULE BY MOUTH ONCE DAILY 90 capsule 3   rosuvastatin (CRESTOR) 20 MG tablet Take 1 tablet (20 mg total) by mouth daily. 90 tablet 3   SYNTHROID 100 MCG tablet TAKE 1 TABLET BY MOUTH ONCE DAILY 90 tablet 0   topiramate (TOPAMAX) 100 MG tablet TAKE 1 TABLET BY MOUTH EVERY EVENING AT BEDTIME 90 tablet 3   COVID-19 mRNA bivalent vaccine, Pfizer, (PFIZER COVID-19 VAC BIVALENT) injection Inject into the muscle. 0.3 mL 0   Current Facility-Administered Medications  Medication Dose Route Frequency Provider Last Rate Last Admin   0.9 %  sodium chloride infusion  500 mL Intravenous Continuous Gatha Mayer, MD        Allergies as of 02/27/2021 - Review Complete 02/27/2021  Allergen Reaction Noted   Bee venom Hives 08/17/2011    Family History  Problem Relation Age of Onset   High Cholesterol Mother    Obesity Mother    Thyroid disease Mother    High Cholesterol Father    Cancer Father    Obesity Father    Heart disease Maternal Grandfather    Colon cancer Maternal Grandfather        ds in his late 78's   Heart disease Paternal Grandmother    Esophageal cancer Other        mat great aunt, non-smoker   Rectal cancer Neg Hx    Stomach cancer Neg Hx    Colon polyps Neg Hx     Social History   Socioeconomic History   Marital status: Single    Spouse name: n/a   Number of children: 0   Years of education: Not on file   Highest education level: Not on file  Occupational History   Occupation: Pediatric ECHO sonographer    Employer: [employer]  Tobacco Use   Smoking status: Never   Smokeless tobacco: Never  Vaping Use   Vaping Use: Never used  Substance and Sexual Activity   Alcohol use: Yes    Comment: social   Drug use: No   Sexual activity: Not Currently    Birth control/protection: Pill  Other Topics Concern   Not on file  Social History Narrative    Lived alone until her mother moved in (retired and moved from [place]).   Her sister and father also live locally.      Review of Systems:  All other review of systems negative except as mentioned in the HPI.  Physical Exam: Vital signs BP 113/68    Pulse 88    Temp 98.6 F (37 C) (Temporal)    Ht 5\' 5"  (1.651 m)    Wt 234 lb (106.1 kg)    LMP 02/03/2021 (Approximate)    SpO2 98%    BMI 38.94 kg/m   General:   Alert,  Well-developed, well-nourished, pleasant and cooperative in NAD Lungs:  Clear throughout to auscultation.   Heart:  Regular rate and rhythm; no murmurs, clicks, rubs,  or gallops. Abdomen:  Soft, nontender and nondistended. Normal bowel sounds.   Neuro/Psych:  Alert and cooperative. Normal mood and affect. A and O x 3   @Mahsa Hanser  Simonne Maffucci, MD, [facility] [phone] (pager) 02/27/2021 9:49 AM@

## 2021-02-27 NOTE — Patient Instructions (Addendum)
I found and removed 3 tiny polyps. No signs of colitis or other problems. The mucous should calm down but is not a sign of disease, necessarily. Often seen with Irritable Bowel Syndrome.  I will let you know pathology results and when to have another routine colonoscopy by mail and/or My Chart.  I appreciate the opportunity to care for you. Gatha Mayer, MD, FACG   YOU HAD AN ENDOSCOPIC PROCEDURE TODAY AT [facility]:   Refer to the procedure report that was given to you for any specific questions about what was found during the examination.  If the procedure report does not answer your questions, please call your gastroenterologist to clarify.  If you requested that your care partner not be given the details of your procedure findings, then the procedure report has been included in a sealed envelope for you to review at your convenience later.  YOU SHOULD EXPECT: Some feelings of bloating in the abdomen. Passage of more gas than usual.  Walking can help get rid of the air that was put into your GI tract during the procedure and reduce the bloating. If you had a lower endoscopy (such as a colonoscopy or flexible sigmoidoscopy) you may notice spotting of blood in your stool or on the toilet paper. If you underwent a bowel prep for your procedure, you may not have a normal bowel movement for a few days.  Please Note:  You might notice some irritation and congestion in your nose or some drainage.  This is from the oxygen used during your procedure.  There is no need for concern and it should clear up in a day or so.  SYMPTOMS TO REPORT IMMEDIATELY:  Following lower endoscopy (colonoscopy or flexible sigmoidoscopy):  Excessive amounts of blood in the stool  Significant tenderness or worsening of abdominal pains  Swelling of the abdomen that is new, acute  Fever of 100F or higher  For urgent or emergent issues, a gastroenterologist can be reached at any hour by calling [phone]  [phone]. Do not use MyChart messaging for urgent concerns.    DIET:  We do recommend a small meal at first, but then you may proceed to your regular diet.  Drink plenty of fluids but you should avoid alcoholic beverages for 24 hours.  ACTIVITY:  You should plan to take it easy for the rest of today and you should NOT DRIVE or use heavy machinery until tomorrow (because of the sedation medicines used during the test).    FOLLOW UP: Our staff will call the number listed on your records 48-72 hours following your procedure to check on you and address any questions or concerns that you may have regarding the information given to you following your procedure. If we do not reach you, we will leave a message.  We will attempt to reach you two times.  During this call, we will ask if you have developed any symptoms of COVID 19. If you develop any symptoms (ie: fever, flu-like symptoms, shortness of breath, cough etc.) before then, please call [phone].  If you test positive for Covid 19 in the 2 weeks post procedure, please call and report this information to [place].    If any biopsies were taken you will be contacted by phone or by letter within the next 1-3 weeks.  Please call us at [phone] if you have not heard about the biopsies in 3 weeks.    SIGNATURES/CONFIDENTIALITY: You and/or your care partner have signed  paperwork which will be entered into your electronic medical record.  These signatures attest to the fact that that the information above on your After Visit Summary has been reviewed and is understood.  Full responsibility of the confidentiality of this discharge information lies with you and/or your care-partner.

## 2021-02-27 NOTE — Progress Notes (Signed)
Called to room to assist during endoscopic procedure.  Patient ID and intended procedure confirmed with present staff. Received instructions for my participation in the procedure from the performing physician.  

## 2021-02-27 NOTE — Op Note (Signed)
[facility] Patient Name: Dana Washington Procedure Date: 02/27/2021 9:50 AM MRN: [id number] Endoscopist: Gatha Mayer , MD Age: 46 Referring MD:  Date of Birth: [birth year] Gender: Female Account #: [id number] Procedure:                Colonoscopy Indications:              Screening for colorectal malignant neoplasm, This                            is the patient's first colonoscopy Medicines:                Propofol per Anesthesia, Monitored Anesthesia Care Procedure:                Pre-Anesthesia Assessment:                           - Prior to the procedure, a History and Physical                            was performed, and patient medications and                            allergies were reviewed. The patient's tolerance of                            previous anesthesia was also reviewed. The risks                            and benefits of the procedure and the sedation                            options and risks were discussed with the patient.                            All questions were answered, and informed consent                            was obtained. Prior Anticoagulants: The patient has                            taken no previous anticoagulant or antiplatelet                            agents. ASA Grade Assessment: II - A patient with                            mild systemic disease. After reviewing the risks                            and benefits, the patient was deemed in                            satisfactory condition to undergo the procedure.  After obtaining informed consent, the colonoscope                            was passed under direct vision. Throughout the                            procedure, the patient's blood pressure, pulse, and                            oxygen saturations were monitored continuously. The                            Olympus CF-HQ190L [id number]) Colonoscope was                             introduced through the anus and advanced to the the                            cecum, identified by appendiceal orifice and                            ileocecal valve. The colonoscopy was performed                            without difficulty. The patient tolerated the                            procedure well. The quality of the bowel                            preparation was excellent. The ileocecal valve,                            appendiceal orifice, and rectum were photographed.                            The bowel preparation used was SUPREP via split                            dose instruction. Scope In: 10:02:02 AM Scope Out: 10:27:47 AM Scope Withdrawal Time: 0 hours 18 minutes 34 seconds  Total Procedure Duration: 0 hours 25 minutes 45 seconds  Findings:                 The perianal and digital rectal examinations were                            normal.                           Three sessile polyps were found in the ascending                            colon. The polyps were diminutive in size. These  polyps were removed with a cold snare. Resection                            and retrieval were complete. Verification of                            patient identification for the specimen was done.                            Estimated blood loss was minimal.                           The exam was otherwise without abnormality on                            direct and retroflexion views. Complications:            No immediate complications. Estimated Blood Loss:     Estimated blood loss was minimal. Impression:               - Three diminutive polyps in the ascending colon,                            removed with a cold snare. Resected and retrieved.                           - The examination was otherwise normal on direct                            and retroflexion views. Recommendation:           - Patient has a contact number available for                             emergencies. The signs and symptoms of potential                            delayed complications were discussed with the                            patient. Return to normal activities tomorrow.                            Written discharge instructions were provided to the                            patient.                           - Resume previous diet.                           - Continue present medications.                           - Await pathology results.                           -  Repeat colonoscopy is recommended. The                            colonoscopy date will be determined after pathology                            results from today's exam become available for                            review.                           - Titrate MiraLax for effect re: constipation Gatha Mayer, MD 02/27/2021 10:36:15 AM This report has been signed electronically.

## 2021-03-01 NOTE — Progress Notes (Signed)
Chief Complaint:   OBESITY Dana Washington is here to discuss her progress with her obesity treatment plan along with follow-up of her obesity related diagnoses. Dana Washington is on the Category 3 Plan and states she is following her eating plan approximately 13% of the time. Dana Washington states she is doing 0 minutes 0 times per week.  Today's visit was #: 11 Starting weight: 273 lbs Starting date: 08/27/2020 Today's weight: 238 lbs Today's date: 02/26/2021 Total lbs lost to date: 35 Total lbs lost since last in-office visit: 6  Interim History: Dana Washington has been sick for 2-3 weeks with a significant GI bug. She has a colonoscopy tomorrow, and she is just starting to be able to eat food again. She has tried to drink protein shakes to help hydrate and increase protein.  Subjective:   1. Essential  hypertension Dana Washington's blood pressure is well controlled on her medications. She was dehydrated earlier with a low blood pressure but she is feeling better now. She continues to work on diet, exercise, and weight loss.   Assessment/Plan:   1. Essential  hypertension Dana Washington will continue her diet and medications, and increase her water intake. We will continue to monitor as she continues her lifestyle modifications.  2. Obesity with current BMI of 39.6 Dana Washington is currently in the action stage of change. As such, her goal is to continue with weight loss efforts. She has agreed to the Category 3 Plan.   Behavioral modification strategies: increasing lean protein intake and no skipping meals.  Dana Washington has agreed to follow-up with our clinic in 3 weeks. She was informed of the importance of frequent follow-up visits to maximize her success with intensive lifestyle modifications for her multiple health conditions.   Objective:   Blood pressure 107/76, pulse 89, height 5\' 6"  (1.676 m), weight 238 lb (108 kg), last menstrual period 02/03/2021, SpO2 97 %. Body mass index is 38.41 kg/m.  General:  Cooperative, alert, well developed, in no acute distress. HEENT: Conjunctivae and lids unremarkable. Cardiovascular: Regular rhythm.  Lungs: Normal work of breathing. Neurologic: No focal deficits.   Lab Results  Component Value Date   CREATININE 0.90 02/08/2021   BUN 12 02/08/2021   NA 136 02/08/2021   K 2.8 (L) 02/08/2021   CL 98 02/08/2021   CO2 25 02/08/2021   Lab Results  Component Value Date   ALT 19 02/08/2021   AST 23 02/08/2021   ALKPHOS 54 02/08/2021   BILITOT 0.4 02/08/2021   Lab Results  Component Value Date   HGBA1C 5.9 (H) 08/27/2020   HGBA1C 5.5 06/19/2019   HGBA1C 5.6 04/10/2018   HGBA1C 5.4 04/01/2017   HGBA1C 5.3 09/27/2016   Lab Results  Component Value Date   INSULIN 28.0 (H) 08/27/2020   Lab Results  Component Value Date   TSH 1.24 07/18/2020   Lab Results  Component Value Date   CHOL 165 10/21/2020   HDL 56 10/21/2020   LDLCALC 87 10/21/2020   TRIG 119 10/21/2020   CHOLHDL 2.9 10/21/2020   Lab Results  Component Value Date   VD25OH 45.7 08/27/2020   VD25OH 32 06/19/2019   VD25OH 33 04/10/2018   Lab Results  Component Value Date   WBC 8.0 02/08/2021   HGB 15.5 (H) 02/08/2021   HCT 45.4 02/08/2021   MCV 84.5 02/08/2021   PLT 416 (H) 02/08/2021   No results found for: IRON, TIBC, FERRITIN  Attestation Statements:   Reviewed by clinician on day of visit: allergies, medications,  problem list, medical history, surgical history, family history, social history, and previous encounter notes.  Time spent on visit including pre-visit chart review and post-visit care and charting was 35 minutes.    I, Trixie Dredge, am acting as transcriptionist for Dennard Nip, MD.  I have reviewed the above documentation for accuracy and completeness, and I agree with the above. -  Dennard Nip, MD

## 2021-03-04 ENCOUNTER — Encounter: Payer: Self-pay | Admitting: Internal Medicine

## 2021-03-04 ENCOUNTER — Telehealth: Payer: Self-pay

## 2021-03-04 DIAGNOSIS — Z860100 Personal history of colon polyps, unspecified: Secondary | ICD-10-CM | POA: Insufficient documentation

## 2021-03-04 HISTORY — DX: Personal history of colon polyps, unspecified: Z86.0100

## 2021-03-04 HISTORY — DX: Personal history of colonic polyps: Z86.010

## 2021-03-04 NOTE — Telephone Encounter (Signed)
°  Follow up Call-  Call back number 02/27/2021 06/30/2018  Post procedure Call Back phone  # [phone]  Permission to leave phone message Yes Yes  Some recent data might be hidden     Patient questions:  Do you have a fever, pain , or abdominal swelling? No. Pain Score  0 *  Have you tolerated food without any problems? Yes.    Have you been able to return to your normal activities? Yes.    Do you have any questions about your discharge instructions: Diet   No. Medications  No. Follow up visit  No.  Do you have questions or concerns about your Care? No.  Actions: * If pain score is 4 or above: No action needed, pain <4.

## 2021-03-14 ENCOUNTER — Telehealth: Payer: No Typology Code available for payment source | Admitting: Nurse Practitioner

## 2021-03-14 ENCOUNTER — Other Ambulatory Visit: Payer: Self-pay | Admitting: Internal Medicine

## 2021-03-14 DIAGNOSIS — H60332 Swimmer's ear, left ear: Secondary | ICD-10-CM | POA: Diagnosis not present

## 2021-03-14 MED ORDER — NEOMYCIN-POLYMYXIN-HC 3.5-10000-1 OT SOLN: 4.0000 [drp] | Freq: Four times a day (QID) | OTIC | 0 refills | Status: DC

## 2021-03-14 NOTE — Progress Notes (Signed)
I have spent 5 minutes in review of e-visit questionnaire, review and updating patient chart, medical decision making and response to patient.  ° °Dana Dollar W Karah Caruthers, NP ° °  °

## 2021-03-14 NOTE — Progress Notes (Signed)
E Visit for Swimmer's Ear ? ?We are sorry that you are not feeling well. Here is how we plan to help! ? ?I have prescribed: Neomycin 0.35%, polymyxin B 10,000 units/mL, and hydrocortisone 0,5% otic solution 4 drops in affected ears four times a day for 7 days ? ?In certain cases swimmer's ear may progress to a more serious bacterial infection of the middle or inner ear.  If you have a fever 102 and up and significantly worsening symptoms, this could indicate a more serious infection moving to the middle/inner and needs face to face evaluation in an office by a provider. ? ?Your symptoms should improve over the next 3 days and should resolve in about 7 days. ? ?HOME CARE: ? ?Wash your hands frequently. ?Do not place the tip of the bottle on your ear or touch it with your fingers. ?You can take Acetominophen 650 mg every 4-6 hours as needed for pain.  If pain is severe or moderate, you can apply a heating pad (set on low) or hot water bottle (wrapped in a towel) to outer ear for 20 minutes.  This will also increase drainage. ?Avoid ear plugs ?Do not use Q-tips ?After showers, help the water run out by tilting your head to one side. ? ?GET HELP RIGHT AWAY IF: ? ?Fever is over 102.2 degrees. ?You develop progressive ear pain or hearing loss. ?Ear symptoms persist longer than 3 days after treatment. ? ?MAKE SURE YOU: ? ?Understand these instructions. ?Will watch your condition. ?Will get help right away if you are not doing well or get worse. ? ?TO PREVENT SWIMMER'S EAR: ?Use a bathing cap or custom fitted swim molds to keep your ears dry. ?Towel off after swimming to dry your ears. ?Tilt your head or pull your earlobes to allow the water to escape your ear canal. ?If there is still water in your ears, consider using a hairdryer on the lowest setting. ? ? ?Thank you for choosing an e-visit. ? ?Your e-visit answers were reviewed by a board certified advanced clinical practitioner to complete your personal care plan.  Depending upon the condition, your plan could have included both over the counter or prescription medications. ? ?Please review your pharmacy choice. Make sure the pharmacy is open so you can pick up prescription now. If there is a problem, you may contact your provider through [employer] and have the prescription routed to another pharmacy.  Your safety is important to [place]. If you have drug allergies check your prescription carefully.  ? ?For the next 24 hours you can use MyChart to ask questions about today's visit, request a non-urgent call back, or ask for a work or school excuse. ?You will get an email in the next two days asking about your experience. I hope that your e-visit has been valuable and will speed your recovery. ? ?  ?

## 2021-03-15 ENCOUNTER — Other Ambulatory Visit (HOSPITAL_COMMUNITY): Payer: Self-pay

## 2021-03-15 MED ORDER — SYNTHROID 100 MCG PO TABS
100.0000 ug | ORAL_TABLET | Freq: Every day | ORAL | 0 refills | Status: DC
  Filled 2021-03-15: qty 90, 90d supply, fill #0

## 2021-03-16 ENCOUNTER — Other Ambulatory Visit (HOSPITAL_COMMUNITY): Payer: Self-pay

## 2021-03-16 ENCOUNTER — Encounter: Payer: Self-pay | Admitting: Internal Medicine

## 2021-03-16 ENCOUNTER — Telehealth: Payer: Self-pay | Admitting: Internal Medicine

## 2021-03-16 ENCOUNTER — Other Ambulatory Visit: Payer: Self-pay

## 2021-03-16 ENCOUNTER — Ambulatory Visit (INDEPENDENT_AMBULATORY_CARE_PROVIDER_SITE_OTHER): Payer: No Typology Code available for payment source | Admitting: Internal Medicine

## 2021-03-16 VITALS — BP 108/72 | HR 77 | Temp 98.1°F | Ht 65.0 in | Wt 242.2 lb

## 2021-03-16 DIAGNOSIS — H6502 Acute serous otitis media, left ear: Secondary | ICD-10-CM | POA: Diagnosis not present

## 2021-03-16 DIAGNOSIS — R7302 Impaired glucose tolerance (oral): Secondary | ICD-10-CM | POA: Diagnosis not present

## 2021-03-16 DIAGNOSIS — H6012 Cellulitis of left external ear: Secondary | ICD-10-CM

## 2021-03-16 MED ORDER — FLUCONAZOLE 150 MG PO TABS
150.0000 mg | ORAL_TABLET | Freq: Once | ORAL | 1 refills | Status: AC
  Filled 2021-03-16: qty 1, 1d supply, fill #0

## 2021-03-16 MED ORDER — DOXYCYCLINE HYCLATE 100 MG PO TABS
100.0000 mg | ORAL_TABLET | Freq: Two times a day (BID) | ORAL | 0 refills | Status: DC
  Filled 2021-03-16: qty 20, 10d supply, fill #0

## 2021-03-16 NOTE — Telephone Encounter (Signed)
Dana Washington ?[phone] ? ?Dana Washington called to say she is having left ear pain, it is also hurting behind the ear, she tried the evisit over the weekend, and the medicine the gave her is not helping at all  ? ?

## 2021-03-16 NOTE — Telephone Encounter (Signed)
After talking with Dr Renold Genta schedule for office visit. ?

## 2021-03-16 NOTE — Patient Instructions (Addendum)
It was a pleasure to see you today. Take Doxycycline 100 mg twice daily for 10 days. Take Diflucan if you develop Cancida vaginitis symptoms while on antibiotics.  You may discontinue Cortisporin otic drops.  Call if not better in 48 hours or sooner if worse. ?

## 2021-03-16 NOTE — Progress Notes (Signed)
? ?  Subjective:  ? ? Patient ID: Dana Washington, female    DOB: 03/12/1975, 46 y.o.   MRN: [id number] ? ?HPI 46 year old Female with left ear pain.  No recent URI symptoms.  She had an E-visit on March 4 and was diagnosed with Swimmer's ear.  Was treated with external eardrops which have not helped.  No fever, shaking chills or sore throat. ? ?History of essential hypertension, hyperlipidemia, obesity, impaired glucose tolerance, anxiety, hypothyroidism and migraine headaches.  History of functional constipation. ? ? ? ?Review of Systems see above ? ?   ?Objective:  ? Physical Exam ?Temperature 98.1 degrees orally blood pressure 108/72 pulse 77 regular pulse oximetry 98% weight 242 pounds 4 ounces BMI of 40.31 ? ?She has a left serous otitis media.  The external ear canal is normal.  She has erythema at the superior aspect of her left external ear with a small central raised area that is tender to touch.  This could represent perhaps an insect bite or carbuncle. ? ? ? ?   ?Assessment & Plan:  ?Left serous otitis media ? ?Infection of left external ear-superior aspect ? ?Impaired glucose tolerance ? ?Plan: I do not think she needs Cortisporin Otic drops as there is no evidence of external ear infection.  She does have a left otitis media and apparent cellulitis of left external ear superior aspect.  Was treated with doxycycline 100 mg twice daily for 10 days.  Was given Diflucan should she develop Candida vaginitis while on antibiotic therapy.  Discontinue Cortisporin otic suspension.  Call if not better in 48 hours or sooner if worse. ? ?

## 2021-03-18 ENCOUNTER — Other Ambulatory Visit: Payer: Self-pay

## 2021-03-18 ENCOUNTER — Encounter: Payer: Self-pay | Admitting: Podiatry

## 2021-03-18 ENCOUNTER — Ambulatory Visit: Payer: No Typology Code available for payment source | Admitting: Podiatry

## 2021-03-18 DIAGNOSIS — L600 Ingrowing nail: Secondary | ICD-10-CM | POA: Diagnosis not present

## 2021-03-18 NOTE — Patient Instructions (Signed)

## 2021-03-19 NOTE — Progress Notes (Signed)
Subjective:  ? ?Patient ID: Dana Washington, female   DOB: 46 y.o.   MRN: [id number]  ? ?HPI ?Patient presents stating that she did good from ingrown toenail removal but is developed a spicule in the left hallux medial border that becomes painful and irritated neuro ? ? ?ROS ? ? ?   ?Objective:  ?Physical Exam  ?Vascular status intact with spicule formation left hallux medial border localized to this area ? ?   ?Assessment:  ?In around toenail deformity left hallux medial border with good chemical application previous but did develop spicule formation ? ?   ?Plan:  ?Commended going ahead and taking care of this and she wants this done and understands risk signed consent form and today I infiltrated 60 mg like Marcaine mixture sterile prep done and used sterile instrumentation to remove the spicule exposed the medial matrix and applied chemical consisting of phenol 3 applications 30 seconds followed by alcohol lavage sterile dressing.  Gave instructions on soaks and to leave dressing on 24 hours but take it off earlier if throbbing were to occur ?   ? ? ?

## 2021-03-21 ENCOUNTER — Other Ambulatory Visit (HOSPITAL_COMMUNITY): Payer: Self-pay

## 2021-03-23 ENCOUNTER — Other Ambulatory Visit (HOSPITAL_COMMUNITY): Payer: Self-pay

## 2021-03-25 ENCOUNTER — Encounter (INDEPENDENT_AMBULATORY_CARE_PROVIDER_SITE_OTHER): Payer: Self-pay | Admitting: Family Medicine

## 2021-03-25 ENCOUNTER — Other Ambulatory Visit: Payer: Self-pay

## 2021-03-25 ENCOUNTER — Ambulatory Visit (INDEPENDENT_AMBULATORY_CARE_PROVIDER_SITE_OTHER): Payer: No Typology Code available for payment source | Admitting: Family Medicine

## 2021-03-25 VITALS — BP 138/79 | HR 103 | Temp 98.1°F | Ht 66.0 in | Wt 239.0 lb

## 2021-03-25 DIAGNOSIS — I10 Essential (primary) hypertension: Secondary | ICD-10-CM | POA: Diagnosis not present

## 2021-03-25 DIAGNOSIS — E785 Hyperlipidemia, unspecified: Secondary | ICD-10-CM

## 2021-03-25 DIAGNOSIS — E876 Hypokalemia: Secondary | ICD-10-CM | POA: Diagnosis not present

## 2021-03-25 DIAGNOSIS — E038 Other specified hypothyroidism: Secondary | ICD-10-CM | POA: Diagnosis not present

## 2021-03-25 DIAGNOSIS — Z6838 Body mass index (BMI) 38.0-38.9, adult: Secondary | ICD-10-CM

## 2021-03-25 DIAGNOSIS — E669 Obesity, unspecified: Secondary | ICD-10-CM

## 2021-03-30 ENCOUNTER — Other Ambulatory Visit (HOSPITAL_COMMUNITY): Payer: Self-pay

## 2021-03-30 NOTE — Progress Notes (Signed)
? ? ? ?Chief Complaint:  ? ?OBESITY ?Dana Washington is here to discuss her progress with her obesity treatment plan along with follow-up of her obesity related diagnoses. Dana Washington is on the Category 3 Plan and states she is following her eating plan approximately 100% of the time. Dana Washington states she is doing 0 minutes 0 times per week. ? ?Today's visit was #: 12 ?Starting weight: 273 lbs ?Starting date: 08/27/2020 ?Today's weight: 239 lbs ?Today's date: 03/25/2021 ?Total lbs lost to date: 17 ?Total lbs lost since last in-office visit: 0 ? ?Interim History: Dana Washington has overall done well with weight loss. She has had multiple stressors since her last visit, and she has been stress eating on pretzels.  ? ?Subjective:  ? ?1. Essential  hypertension ?Dana Washington is taking Lotensin 20-12.5 mg. Her blood pressure os elevated today. She notes more stress at work. She denies chest pain or palpitations. ? ?2. Other hyperlipidemia ?Dana Washington is taking Crestor 20 mg, and she denies side effects. ? ?3. Hypokalemia ?Dana Washington's last potassium was 2.8. She denies leg cramps. She reports problems with diarrhea during her last labs. ? ?4. Other specified hypothyroidism ?Dana Washington is taking Synthroid 100 mg, and she denies side effects.  ? ?Assessment/Plan:  ? ?1. Essential  hypertension ?Dana Washington will continue to follow up with her PCP, and will continue her medications as directed. We will recheck fasting labs at her next visit. ? ?2. Other hyperlipidemia ?Cardiovascular risk and specific lipid/LDL goals reviewed. We discussed several lifestyle modifications today. Dana Washington will continue to follow up with her PCP, and will continue her medications as directed. We will recheck fasting labs at her next visit. Orders and follow up as documented in patient record.  ? ?3. Hypokalemia ?We will recheck labs at [facility] next visit.  ? ?4. Other specified hypothyroidism ?Dana Washington will continue to follow up with her PCP, and will continue her medications as  directed. Orders and follow up as documented in patient record. ? ?5. Obesity with current BMI of 38.6 ?Dana Washington is currently in the action stage of change. As such, her goal is to continue with weight loss efforts. She has agreed to the Category 3 Plan.  ? ?Exercise goals: Plans to start spin class. ? ?Behavioral modification strategies: increasing lean protein intake, increasing water intake, no skipping meals, meal planning and cooking strategies, and planning for success. ? ?Dana Washington has agreed to follow-up with our clinic in 4 weeks. She was informed of the importance of frequent follow-up visits to maximize her success with intensive lifestyle modifications for her multiple health conditions.  ? ?Objective:  ? ?Blood pressure 138/79, pulse (!) 103, temperature 98.1 ?F (36.7 ?C), height '5\' 6"'$  (1.676 m), weight 239 lb (108.4 kg), SpO2 97 %. ?Body mass index is 38.58 kg/m?. ? ?General: Cooperative, alert, well developed, in no acute distress. ?HEENT: Conjunctivae and lids unremarkable. ?Cardiovascular: Regular rhythm.  ?Lungs: Normal work of breathing. ?Neurologic: No focal deficits.  ? ?Lab Results  ?Component Value Date  ? CREATININE 0.90 02/08/2021  ? BUN 12 02/08/2021  ? NA 136 02/08/2021  ? K 2.8 (L) 02/08/2021  ? CL 98 02/08/2021  ? CO2 25 02/08/2021  ? ?Lab Results  ?Component Value Date  ? ALT 19 02/08/2021  ? AST 23 02/08/2021  ? ALKPHOS 54 02/08/2021  ? BILITOT 0.4 02/08/2021  ? ?Lab Results  ?Component Value Date  ? HGBA1C 5.9 (H) 08/27/2020  ? HGBA1C 5.5 06/19/2019  ? HGBA1C 5.6 04/10/2018  ? HGBA1C 5.4 04/01/2017  ? HGBA1C  5.3 09/27/2016  ? ?Lab Results  ?Component Value Date  ? INSULIN 28.0 (H) 08/27/2020  ? ?Lab Results  ?Component Value Date  ? TSH 1.24 07/18/2020  ? ?Lab Results  ?Component Value Date  ? CHOL 165 10/21/2020  ? HDL 56 10/21/2020  ? [place] 87 10/21/2020  ? TRIG 119 10/21/2020  ? CHOLHDL 2.9 10/21/2020  ? ?Lab Results  ?Component Value Date  ? VD25OH 45.7 08/27/2020  ? VD25OH 32  06/19/2019  ? VD25OH 33 04/10/2018  ? ?Lab Results  ?Component Value Date  ? WBC 8.0 02/08/2021  ? HGB 15.5 (H) 02/08/2021  ? HCT 45.4 02/08/2021  ? MCV 84.5 02/08/2021  ? PLT 416 (H) 02/08/2021  ? ?No results found for: IRON, TIBC, FERRITIN ? ?Attestation Statements:  ? ?Reviewed by clinician on day of visit: allergies, medications, problem list, medical history, surgical history, family history, social history, and previous encounter notes. ? ? ?I, Trixie Dredge, am acting as transcriptionist for Dennard Nip, MD. ? ?I have reviewed the above documentation for accuracy and completeness, and I agree with the above. -  Dennard Nip, MD ? ? ?

## 2021-04-12 ENCOUNTER — Other Ambulatory Visit (HOSPITAL_COMMUNITY): Payer: Self-pay

## 2021-04-13 ENCOUNTER — Other Ambulatory Visit (HOSPITAL_COMMUNITY): Payer: Self-pay

## 2021-05-04 ENCOUNTER — Encounter (INDEPENDENT_AMBULATORY_CARE_PROVIDER_SITE_OTHER): Payer: Self-pay | Admitting: Family Medicine

## 2021-05-04 ENCOUNTER — Ambulatory Visit (INDEPENDENT_AMBULATORY_CARE_PROVIDER_SITE_OTHER): Payer: No Typology Code available for payment source | Admitting: Family Medicine

## 2021-05-04 VITALS — BP 113/77 | HR 80 | Temp 97.5°F | Ht 66.0 in | Wt 238.0 lb

## 2021-05-04 DIAGNOSIS — E669 Obesity, unspecified: Secondary | ICD-10-CM

## 2021-05-04 DIAGNOSIS — Z6838 Body mass index (BMI) 38.0-38.9, adult: Secondary | ICD-10-CM

## 2021-05-04 DIAGNOSIS — E7849 Other hyperlipidemia: Secondary | ICD-10-CM | POA: Diagnosis not present

## 2021-05-04 DIAGNOSIS — R7303 Prediabetes: Secondary | ICD-10-CM | POA: Diagnosis not present

## 2021-05-04 DIAGNOSIS — E038 Other specified hypothyroidism: Secondary | ICD-10-CM

## 2021-05-04 DIAGNOSIS — E559 Vitamin D deficiency, unspecified: Secondary | ICD-10-CM

## 2021-05-04 DIAGNOSIS — I10 Essential (primary) hypertension: Secondary | ICD-10-CM

## 2021-05-05 LAB — CMP14+EGFR
ALT: 15 IU/L (ref 0–32)
AST: 18 IU/L (ref 0–40)
Albumin/Globulin Ratio: 1.7 (ref 1.2–2.2)
Albumin: 4.4 g/dL (ref 3.8–4.8)
Alkaline Phosphatase: 82 IU/L (ref 44–121)
BUN/Creatinine Ratio: 17 (ref 9–23)
BUN: 17 mg/dL (ref 6–24)
Bilirubin Total: 0.3 mg/dL (ref 0.0–1.2)
CO2: 24 mmol/L (ref 20–29)
Calcium: 9.2 mg/dL (ref 8.7–10.2)
Chloride: 104 mmol/L (ref 96–106)
Creatinine, Ser: 0.98 mg/dL (ref 0.57–1.00)
Globulin, Total: 2.6 g/dL (ref 1.5–4.5)
Glucose: 94 mg/dL (ref 70–99)
Potassium: 4.9 mmol/L (ref 3.5–5.2)
Sodium: 141 mmol/L (ref 134–144)
Total Protein: 7 g/dL (ref 6.0–8.5)
eGFR: 72 mL/min/{1.73_m2} (ref >59)

## 2021-05-05 LAB — LIPID PANEL WITH LDL/HDL RATIO
Cholesterol, Total: 158 mg/dL (ref 100–199)
HDL: 54 mg/dL (ref >39)
LDL Chol Calc (NIH): 76 mg/dL (ref 0–99)
LDL/HDL Ratio: 1.4 ratio (ref 0.0–3.2)
Triglycerides: 166 mg/dL — ABNORMAL HIGH (ref 0–149)
VLDL Cholesterol Cal: 28 mg/dL (ref 5–40)

## 2021-05-05 LAB — CBC WITH DIFFERENTIAL/PLATELET
Basophils Absolute: 0.1 10*3/uL (ref 0.0–0.2)
Basos: 1 %
EOS (ABSOLUTE): 0.2 10*3/uL (ref 0.0–0.4)
Eos: 4 %
Hematocrit: 47.5 % — ABNORMAL HIGH (ref 34.0–46.6)
Hemoglobin: 15.6 g/dL (ref 11.1–15.9)
Immature Grans (Abs): 0 10*3/uL (ref 0.0–0.1)
Immature Granulocytes: 1 %
Lymphocytes Absolute: 1.7 10*3/uL (ref 0.7–3.1)
Lymphs: 25 %
MCH: 30.5 pg (ref 26.6–33.0)
MCHC: 32.8 g/dL (ref 31.5–35.7)
MCV: 93 fL (ref 79–97)
Monocytes Absolute: 0.5 10*3/uL (ref 0.1–0.9)
Monocytes: 8 %
Neutrophils Absolute: 4.1 10*3/uL (ref 1.4–7.0)
Neutrophils: 61 %
Platelets: 325 10*3/uL (ref 150–450)
RBC: 5.12 x10E6/uL (ref 3.77–5.28)
RDW: 13.9 % (ref 11.7–15.4)
WBC: 6.6 10*3/uL (ref 3.4–10.8)

## 2021-05-05 LAB — VITAMIN D 25 HYDROXY (VIT D DEFICIENCY, FRACTURES): Vit D, 25-Hydroxy: 65.5 ng/mL (ref 30.0–100.0)

## 2021-05-05 LAB — INSULIN, RANDOM: INSULIN: 22.3 u[IU]/mL (ref 2.6–24.9)

## 2021-05-05 LAB — HEMOGLOBIN A1C
Est. average glucose Bld gHb Est-mCnc: 111 mg/dL
Hgb A1c MFr Bld: 5.5 % (ref 4.8–5.6)

## 2021-05-05 LAB — TSH: TSH: 0.934 u[IU]/mL (ref 0.450–4.500)

## 2021-05-13 NOTE — Progress Notes (Signed)
? ? ? ?Chief Complaint:  ? ?OBESITY ?Dana Washington is here to discuss her progress with her obesity treatment plan along with follow-up of her obesity related diagnoses. Dana Washington is on the Category 3 Plan and states she is following her eating plan approximately 97% of the time. Dana Washington states she is doing leg lifts and push ups for 30 minutes 7 times per week. ? ?Today's visit was #: 13 ?Starting weight: 273 lbs ?Starting date: 08/27/2020 ?Today's weight: 238 lbs ?Today's date: 05/04/2021 ?Total lbs lost to date: 50 ?Total lbs lost since last in-office visit: 1 ? ?Interim History: Dana Washington continues to do well with weight loss. She is deviating from her plan more, but she is trying to make healthier choices. She eats a lot  of vegetables and she is working on meeting her protein goals but she would like more freedom in her eating plan.  ? ?Subjective:  ? ?1. Pre-diabetes ?Dana Washington is working on her diet and exercise, and she is due for labs. She is doing well with decreasing simple carbohydrates.  ? ?2. Primary hypertension ?Dana Washington's blood pressure controlled on medications and with diet, with the goal to decrease the need for medications.  ? ?3. Other hyperlipidemia ?Dana Washington not on statin, and doing very well with diet.  ? ?4. Vitamin D deficiency ?Dana Washington's Vitamin D level has been low in the past. She is due for labs.  ? ?5. Other specified hypothyroidism ?Dana Washington is stable on Synthroid, and he denies signs of over or under-replacement.  ? ?Assessment/Plan:  ? ?1. Pre-diabetes ?We will check labs today. Renette will continue to work on weight loss, exercise, and decreasing simple carbohydrates to help decrease the risk of diabetes.  ? ?- CBC with Differential/Platelet ?- Lipid Panel With LDL/HDL Ratio ?- Insulin, random ?- Hemoglobin A1c ? ?2. Primary hypertension ?We will check labs today. Dana Washington will continue working on healthy weight loss and exercise to improve blood pressure control. We will watch for signs of  hypotension as she continues her lifestyle modifications. ? ?- CMP14+EGFR ? ?3. Other hyperlipidemia ?Cardiovascular risk and specific lipid/LDL goals reviewed. We discussed several lifestyle modifications today. We will check labs today. Dana Washington will continue to work on diet, exercise and weight loss efforts. Orders and follow up as documented in patient record.  ? ?4. Vitamin D deficiency ?We will check labs today. Dana Washington will follow-up for routine testing of Vitamin D, at least 2-3 times per year to avoid over-replacement. ? ?- VITAMIN D 25 Hydroxy (Vit-D Deficiency, Fractures) ? ?5. Other specified hypothyroidism ?We will check labs today. Dana Washington will continue Synthroid. Orders and follow up as documented in patient record. ? ?- TSH ? ?6. Obesity, current BMI 38.5 ?Dana Washington is currently in the action stage of change. As such, her goal is to continue with weight loss efforts. She has agreed to keeping a food journal and adhering to recommended goals of 1400-1600 calories and 100+ grams of protein daily.  ? ?Exercise goals: As is. ? ?Behavioral modification strategies: increasing lean protein intake and keeping a strict food journal. ? ?Dana Washington has agreed to follow-up with our clinic in 4 weeks. She was informed of the importance of frequent follow-up visits to maximize her success with intensive lifestyle modifications for her multiple health conditions.  ? ?Objective:  ? ?Blood pressure 113/77, pulse 80, temperature (!) 97.5 ?F (36.4 ?C), height '5\' 6"'  (1.676 m), weight 238 lb (108 kg), SpO2 97 %. ?Body mass index is 38.41 kg/m?. ? ?General: Cooperative, alert, well developed,  in no acute distress. ?HEENT: Conjunctivae and lids unremarkable. ?Cardiovascular: Regular rhythm.  ?Lungs: Normal work of breathing. ?Neurologic: No focal deficits.  ? ?Lab Results  ?Component Value Date  ? CREATININE 0.98 05/04/2021  ? BUN 17 05/04/2021  ? NA 141 05/04/2021  ? K 4.9 05/04/2021  ? CL 104 05/04/2021  ? CO2 24 05/04/2021   ? ?Lab Results  ?Component Value Date  ? ALT 15 05/04/2021  ? AST 18 05/04/2021  ? ALKPHOS 82 05/04/2021  ? BILITOT 0.3 05/04/2021  ? ?Lab Results  ?Component Value Date  ? HGBA1C 5.5 05/04/2021  ? HGBA1C 5.9 (H) 08/27/2020  ? HGBA1C 5.5 06/19/2019  ? HGBA1C 5.6 04/10/2018  ? HGBA1C 5.4 04/01/2017  ? ?Lab Results  ?Component Value Date  ? INSULIN 22.3 05/04/2021  ? INSULIN 28.0 (H) 08/27/2020  ? ?Lab Results  ?Component Value Date  ? TSH 0.934 05/04/2021  ? ?Lab Results  ?Component Value Date  ? CHOL 158 05/04/2021  ? HDL 54 05/04/2021  ? [place] 76 05/04/2021  ? TRIG 166 (H) 05/04/2021  ? CHOLHDL 2.9 10/21/2020  ? ?Lab Results  ?Component Value Date  ? VD25OH 65.5 05/04/2021  ? VD25OH 45.7 08/27/2020  ? VD25OH 32 06/19/2019  ? ?Lab Results  ?Component Value Date  ? WBC 6.6 05/04/2021  ? HGB 15.6 05/04/2021  ? HCT 47.5 (H) 05/04/2021  ? MCV 93 05/04/2021  ? PLT 325 05/04/2021  ? ?No results found for: IRON, TIBC, FERRITIN ? ?Attestation Statements:  ? ?Reviewed by clinician on day of visit: allergies, medications, problem list, medical history, surgical history, family history, social history, and previous encounter notes. ? ? ?I, Trixie Dredge, am acting as transcriptionist for Dennard Nip, MD. ? ?I have reviewed the above documentation for accuracy and completeness, and I agree with the above. -  Dennard Nip, MD ? ? ?

## 2021-05-21 ENCOUNTER — Ambulatory Visit (INDEPENDENT_AMBULATORY_CARE_PROVIDER_SITE_OTHER): Payer: No Typology Code available for payment source | Admitting: Internal Medicine

## 2021-05-21 ENCOUNTER — Telehealth: Payer: Self-pay

## 2021-05-21 ENCOUNTER — Encounter: Payer: Self-pay | Admitting: Internal Medicine

## 2021-05-21 ENCOUNTER — Other Ambulatory Visit (HOSPITAL_COMMUNITY): Payer: Self-pay

## 2021-05-21 VITALS — BP 108/82 | HR 89 | Temp 98.8°F

## 2021-05-21 DIAGNOSIS — R7302 Impaired glucose tolerance (oral): Secondary | ICD-10-CM | POA: Diagnosis not present

## 2021-05-21 DIAGNOSIS — I10 Essential (primary) hypertension: Secondary | ICD-10-CM | POA: Diagnosis not present

## 2021-05-21 DIAGNOSIS — Z6838 Body mass index (BMI) 38.0-38.9, adult: Secondary | ICD-10-CM | POA: Diagnosis not present

## 2021-05-21 DIAGNOSIS — J22 Unspecified acute lower respiratory infection: Secondary | ICD-10-CM

## 2021-05-21 MED ORDER — METHYLPREDNISOLONE 4 MG PO TABS
ORAL_TABLET | ORAL | 1 refills | Status: DC
  Filled 2021-05-21: qty 21, 6d supply, fill #0

## 2021-05-21 MED ORDER — BENZONATATE 100 MG PO CAPS
100.0000 mg | ORAL_CAPSULE | Freq: Three times a day (TID) | ORAL | 1 refills | Status: DC | PRN
  Filled 2021-05-21: qty 30, 10d supply, fill #0

## 2021-05-21 MED ORDER — FLUCONAZOLE 150 MG PO TABS
150.0000 mg | ORAL_TABLET | Freq: Once | ORAL | 1 refills | Status: AC
  Filled 2021-05-21: qty 1, 1d supply, fill #0

## 2021-05-21 MED ORDER — FLUCONAZOLE 150 MG PO TABS
150.0000 mg | ORAL_TABLET | Freq: Once | ORAL | 0 refills | Status: DC
  Filled 2021-05-21: qty 1, 1d supply, fill #0

## 2021-05-21 MED ORDER — AZITHROMYCIN 250 MG PO TABS
ORAL_TABLET | ORAL | 1 refills | Status: AC
  Filled 2021-05-21: qty 6, 5d supply, fill #0

## 2021-05-21 NOTE — Telephone Encounter (Signed)
Patient has had cough x 2 months. She now is coughing up mucous that looks like jelly. I made her an appt for this afternoon at 330.  ?

## 2021-05-21 NOTE — Progress Notes (Signed)
   Subjective:    Patient ID: Dana Washington, female    DOB: [birth year], 46 y.o.   MRN: [id number]  HPI Patient seen today for a cough ongoing for 2 months with mostly clear sputum production now but has been thick ,clear ,and sticky.  Does not recall an acute respiratory illness that prompted this cough and sputum production.  History of hypothyroidism, GE reflux and migraine headaches.  History of hypertension and hyperlipidemia.  History of depression treated with Prozac.  History of COVID-29 September 2020.  History of impaired glucose tolerance.  Attends Dr. Migdalia Dk clinic for weight loss.  Next appointment with Dr. Leafy Ro is in mid June.  Health maintenance exam scheduled here for October.    Review of Systems leaving to go on 2 week trip to [place] late May.     Objective:   Physical Exam Blood pressure 108/82 pulse 89 temperature 98.8 degrees pulse oximetry 98%.  She does have congested cough in the office today.  Skin: Warm and dry.  No cervical adenopathy.  TMs are clear.  Neck is supple.  No thyromegaly.  Chest is clear to auscultation.  No rales or wheezing are appreciated.       Assessment & Plan:  Acute lower respiratory infection-has upcoming trip to [place] soon.  We are placing her on a 6-day tapering course of Medrol 4 mg tablets as well as a Zithromax Z-PAK.  She was given Diflucan to take if she needs this for Candida vaginitis while on antibiotics.  Also given Tessalon Perles 100 mg 3 times daily as needed for cough.  Continue close follow-up with Dr. Leafy Ro for weight loss and impaired glucose tolerance.  Hypothyroidism-continue Synthroid 100 mcg daily.  Aim is to keep TSH around 1.00.  History of Hashimoto's thyroiditis with resultant hypothyroidism treated with thyroid replacement medication.  History of breast reduction surgery 2010 with seroma development left breast as a result.  History of vitamin D deficiency  Essential hypertension-stable  on Lotensin HCTZ  Hyperlipidemia treated with Crestor generic 20 mg daily  Depression treated with Prozac 40 mg daily  GE reflux treated with Prilosec 20 mg daily  Migraine headaches treated with Topamax

## 2021-05-26 ENCOUNTER — Other Ambulatory Visit: Payer: Self-pay | Admitting: Internal Medicine

## 2021-05-26 ENCOUNTER — Other Ambulatory Visit (HOSPITAL_COMMUNITY): Payer: Self-pay

## 2021-05-27 ENCOUNTER — Other Ambulatory Visit (HOSPITAL_COMMUNITY): Payer: Self-pay

## 2021-05-27 MED ORDER — SYNTHROID 100 MCG PO TABS
100.0000 ug | ORAL_TABLET | Freq: Every day | ORAL | 1 refills | Status: DC
  Filled 2021-05-27: qty 90, 90d supply, fill #0
  Filled 2021-09-05: qty 90, 90d supply, fill #1

## 2021-06-01 ENCOUNTER — Encounter (INDEPENDENT_AMBULATORY_CARE_PROVIDER_SITE_OTHER): Payer: Self-pay | Admitting: Family Medicine

## 2021-06-01 ENCOUNTER — Ambulatory Visit (INDEPENDENT_AMBULATORY_CARE_PROVIDER_SITE_OTHER): Payer: No Typology Code available for payment source | Admitting: Family Medicine

## 2021-06-01 VITALS — BP 114/78 | HR 81 | Temp 98.1°F | Ht 66.0 in | Wt 235.0 lb

## 2021-06-01 DIAGNOSIS — E7849 Other hyperlipidemia: Secondary | ICD-10-CM

## 2021-06-01 DIAGNOSIS — E669 Obesity, unspecified: Secondary | ICD-10-CM

## 2021-06-01 DIAGNOSIS — Z6838 Body mass index (BMI) 38.0-38.9, adult: Secondary | ICD-10-CM

## 2021-06-01 DIAGNOSIS — I10 Essential (primary) hypertension: Secondary | ICD-10-CM | POA: Insufficient documentation

## 2021-06-08 NOTE — Patient Instructions (Addendum)
Take Zithromax Z-PAK 2 tabs day 1 followed by 1 tab days 2 through 5.  Take Medrol 4 mg in tapering course as directed starting with 6 tablets day 1 and decreasing by 1 tablet daily-6-5-4-3-2-1-taper.  Given Tessalon Perles 100 mg 3 times daily as needed for cough.  Okay to take Diflucan if needed for Candida vaginitis.  Continue Synthroid 100 mcg daily.  Continue antihypertensive medication.  CPE scheduled for late October

## 2021-06-15 NOTE — Progress Notes (Signed)
Chief Complaint:   OBESITY Dana Washington is here to discuss her progress with her obesity treatment plan along with follow-up of her obesity related diagnoses. Dana Washington is on keeping a food journal and adhering to recommended goals of 1400-1600 calories and 100+ grams of protein daily and states she is following her eating plan approximately 83% of the time. Dana Washington states she is doing leg lifts, push ups, and various exercises 7 times per week.    Today's visit was #: 14 Starting weight: 273 lbs Starting date: 08/27/2020 Today's weight: 235 lbs Today's date: 06/01/2021 Total lbs lost to date: 38 Total lbs lost since last in-office visit: 3  Interim History: Dana Washington continues to do well with weight loss. She is getting ready to go on vacation. She likes seafood and she is open to looking at other eating options.   Subjective:   1. Other hyperlipidemia Dana Washington is stable on Crestor. She is doing well with her diet and weight loss.   2. Essential hypertension Dana Washington's blood pressure is stable on her medications. She has a light tickling cough, which may be due to allergies versus ACE-I.  Assessment/Plan:   1. Other hyperlipidemia Cardiovascular risk and specific lipid/LDL goals reviewed.  We discussed several lifestyle modifications today. Dana Washington will continue her diet, exercise and weight loss efforts. Orders and follow up as documented in patient record.   2. Essential hypertension Dana Washington will continue her diet and exercise, and we will follow up in 4 weeks and see if her cough has improved.   3. Obesity, Current BMI 38.0 Dana Washington is currently in the action stage of change. As such, her goal is to continue with weight loss efforts. She has agreed to the Category 3 Plan or the [place].   Exercise goals: As is.  Behavioral modification strategies: travel eating strategies.  Dana Washington has agreed to follow-up with our clinic in 4 weeks. She was informed of the importance of  frequent follow-up visits to maximize her success with intensive lifestyle modifications for her multiple health conditions.   Objective:   Blood pressure 114/78, pulse 81, temperature 98.1 F (36.7 C), height '5\' 6"'$  (1.676 m), weight 235 lb (106.6 kg), SpO2 97 %. Body mass index is 37.93 kg/m.  General: Cooperative, alert, well developed, in no acute distress. HEENT: Conjunctivae and lids unremarkable. Cardiovascular: Regular rhythm.  Lungs: Normal work of breathing. Neurologic: No focal deficits.   Lab Results  Component Value Date   CREATININE 0.98 05/04/2021   BUN 17 05/04/2021   NA 141 05/04/2021   K 4.9 05/04/2021   CL 104 05/04/2021   CO2 24 05/04/2021   Lab Results  Component Value Date   ALT 15 05/04/2021   AST 18 05/04/2021   ALKPHOS 82 05/04/2021   BILITOT 0.3 05/04/2021   Lab Results  Component Value Date   HGBA1C 5.5 05/04/2021   HGBA1C 5.9 (H) 08/27/2020   HGBA1C 5.5 06/19/2019   HGBA1C 5.6 04/10/2018   HGBA1C 5.4 04/01/2017   Lab Results  Component Value Date   INSULIN 22.3 05/04/2021   INSULIN 28.0 (H) 08/27/2020   Lab Results  Component Value Date   TSH 0.934 05/04/2021   Lab Results  Component Value Date   CHOL 158 05/04/2021   HDL 54 05/04/2021   LDLCALC 76 05/04/2021   TRIG 166 (H) 05/04/2021   CHOLHDL 2.9 10/21/2020   Lab Results  Component Value Date   VD25OH 65.5 05/04/2021   VD25OH 45.7 08/27/2020   VD25OH  32 06/19/2019   Lab Results  Component Value Date   WBC 6.6 05/04/2021   HGB 15.6 05/04/2021   HCT 47.5 (H) 05/04/2021   MCV 93 05/04/2021   PLT 325 05/04/2021   No results found for: IRON, TIBC, FERRITIN  Attestation Statements:   Reviewed by clinician on day of visit: allergies, medications, problem list, medical history, surgical history, family history, social history, and previous encounter notes.  Time spent on visit including pre-visit chart review and post-visit care and charting was 41 minutes.   I, Trixie Dredge, am acting as transcriptionist for Dennard Nip, MD.  I have reviewed the above documentation for accuracy and completeness, and I agree with the above. -  Dennard Nip, MD

## 2021-06-22 ENCOUNTER — Other Ambulatory Visit (HOSPITAL_COMMUNITY): Payer: Self-pay

## 2021-06-29 ENCOUNTER — Ambulatory Visit (INDEPENDENT_AMBULATORY_CARE_PROVIDER_SITE_OTHER): Payer: No Typology Code available for payment source | Admitting: Family Medicine

## 2021-06-29 ENCOUNTER — Encounter (INDEPENDENT_AMBULATORY_CARE_PROVIDER_SITE_OTHER): Payer: Self-pay | Admitting: Family Medicine

## 2021-06-29 VITALS — BP 121/83 | HR 79 | Temp 98.2°F | Ht 66.0 in | Wt 239.0 lb

## 2021-06-29 DIAGNOSIS — I10 Essential (primary) hypertension: Secondary | ICD-10-CM

## 2021-06-29 DIAGNOSIS — Z6838 Body mass index (BMI) 38.0-38.9, adult: Secondary | ICD-10-CM

## 2021-06-29 DIAGNOSIS — E669 Obesity, unspecified: Secondary | ICD-10-CM | POA: Diagnosis not present

## 2021-06-30 NOTE — Progress Notes (Signed)
Chief Complaint:   OBESITY Dana Washington is here to discuss her progress with her obesity treatment plan along with follow-up of her obesity related diagnoses. Dana Washington is on the Category 3 Plan and the [place] and states she is following her eating plan approximately 100% of the time. Dana Washington states she is active 7 times per week.    Today's visit was #: 15 Starting weight: 273 lbs Starting date: 08/27/2020 Today's weight: 239 lbs Today's date: 06/29/2021 Total lbs lost to date: 34 Total lbs lost since last in-office visit: 0  Interim History: Dana Washington is retaining some fluid today.  She is on her cycle.  She is doing mostly category 3 plan.  She was traveling and working on changing to [employer].  Subjective:   1. Essential hypertension Dana Washington's blood pressure is well controlled on her medications.  She is working on increasing her water intake.  She denies feeling lightheaded or dizzy.  Assessment/Plan:   1. Essential hypertension Dana Washington will continue her diet, exercise, and medications.  She will watch for signs of dehydration.  2. Obesity, Current BMI 38.6 Dana Washington is currently in the action stage of change. As such, her goal is to continue with weight loss efforts. She has agreed to the Category 3 Plan or the [place].   Behavioral modification strategies: increasing water intake and holiday eating strategies .  Dana Washington has agreed to follow-up with our clinic in 4 weeks. She was informed of the importance of frequent follow-up visits to maximize her success with intensive lifestyle modifications for her multiple health conditions.   Objective:   Blood pressure 121/83, pulse 79, temperature 98.2 F (36.8 C), height '5\' 6"'$  (1.676 m), weight 239 lb (108.4 kg), SpO2 98 %. Body mass index is 38.58 kg/m.  General: Cooperative, alert, well developed, in no acute distress. HEENT: Conjunctivae and lids unremarkable. Cardiovascular: Regular rhythm.  Lungs:  Normal work of breathing. Neurologic: No focal deficits.   Lab Results  Component Value Date   CREATININE 0.98 05/04/2021   BUN 17 05/04/2021   NA 141 05/04/2021   K 4.9 05/04/2021   CL 104 05/04/2021   CO2 24 05/04/2021   Lab Results  Component Value Date   ALT 15 05/04/2021   AST 18 05/04/2021   ALKPHOS 82 05/04/2021   BILITOT 0.3 05/04/2021   Lab Results  Component Value Date   HGBA1C 5.5 05/04/2021   HGBA1C 5.9 (H) 08/27/2020   HGBA1C 5.5 06/19/2019   HGBA1C 5.6 04/10/2018   HGBA1C 5.4 04/01/2017   Lab Results  Component Value Date   INSULIN 22.3 05/04/2021   INSULIN 28.0 (H) 08/27/2020   Lab Results  Component Value Date   TSH 0.934 05/04/2021   Lab Results  Component Value Date   CHOL 158 05/04/2021   HDL 54 05/04/2021   LDLCALC 76 05/04/2021   TRIG 166 (H) 05/04/2021   CHOLHDL 2.9 10/21/2020   Lab Results  Component Value Date   VD25OH 65.5 05/04/2021   VD25OH 45.7 08/27/2020   VD25OH 32 06/19/2019   Lab Results  Component Value Date   WBC 6.6 05/04/2021   HGB 15.6 05/04/2021   HCT 47.5 (H) 05/04/2021   MCV 93 05/04/2021   PLT 325 05/04/2021   No results found for: "IRON", "TIBC", "FERRITIN"  Attestation Statements:   Reviewed by clinician on day of visit: allergies, medications, problem list, medical history, surgical history, family history, social history, and previous encounter notes.  Time spent on visit including  pre-visit chart review and post-visit care and charting was 36 minutes.   I, Trixie Dredge, am acting as transcriptionist for Dennard Nip, MD.  I have reviewed the above documentation for accuracy and completeness, and I agree with the above. -  Dennard Nip, MD

## 2021-07-12 ENCOUNTER — Other Ambulatory Visit: Payer: Self-pay | Admitting: Internal Medicine

## 2021-07-12 MED ORDER — FLUOXETINE HCL 40 MG PO CAPS
40.0000 mg | ORAL_CAPSULE | Freq: Every day | ORAL | 3 refills | Status: DC
  Filled 2021-07-12: qty 90, 90d supply, fill #0
  Filled 2021-10-10: qty 90, 90d supply, fill #1
  Filled 2022-01-05: qty 90, 90d supply, fill #2

## 2021-07-12 MED ORDER — ROSUVASTATIN CALCIUM 20 MG PO TABS
20.0000 mg | ORAL_TABLET | Freq: Every day | ORAL | 3 refills | Status: DC
  Filled 2021-07-12: qty 90, 90d supply, fill #0
  Filled 2021-10-10: qty 90, 90d supply, fill #1
  Filled 2022-01-05: qty 90, 90d supply, fill #2
  Filled 2022-04-05: qty 90, 90d supply, fill #0

## 2021-07-13 ENCOUNTER — Other Ambulatory Visit (HOSPITAL_COMMUNITY): Payer: Self-pay

## 2021-07-20 ENCOUNTER — Other Ambulatory Visit (HOSPITAL_COMMUNITY): Payer: Self-pay

## 2021-07-27 ENCOUNTER — Encounter (INDEPENDENT_AMBULATORY_CARE_PROVIDER_SITE_OTHER): Payer: Self-pay | Admitting: Family Medicine

## 2021-07-27 ENCOUNTER — Ambulatory Visit (INDEPENDENT_AMBULATORY_CARE_PROVIDER_SITE_OTHER): Payer: No Typology Code available for payment source | Admitting: Family Medicine

## 2021-07-27 VITALS — BP 114/78 | HR 83 | Temp 98.3°F | Ht 66.0 in | Wt 236.0 lb

## 2021-07-27 DIAGNOSIS — Z6838 Body mass index (BMI) 38.0-38.9, adult: Secondary | ICD-10-CM | POA: Diagnosis not present

## 2021-07-27 DIAGNOSIS — E669 Obesity, unspecified: Secondary | ICD-10-CM

## 2021-07-27 DIAGNOSIS — F3289 Other specified depressive episodes: Secondary | ICD-10-CM | POA: Diagnosis not present

## 2021-07-29 NOTE — Progress Notes (Signed)
Chief Complaint:   OBESITY Briele is here to discuss her progress with her obesity treatment plan along with follow-up of her obesity related diagnoses. Irene is on the Category 3 Plan and the [place] and states she is following her eating plan approximately 70% of the time. Bret states she has been doing daily activities, but no formal exercise.  Today's visit was #: 76 Starting weight: 273 lbs Starting date: 08/27/2020 Today's weight: 236 lbs Today's date: 07/27/2021 Total lbs lost to date: 37 Total lbs lost since last in-office visit: 3  Interim History: Roselin has gotten back on track with her eating plan and she has lost another 3 pounds.  She has been following her pescatarian plan.  She has been frustrated with how difficult weight loss has been.  Subjective:   1. Other depression, emotional eating binges Elivia continues to work on decreasing emotional eating behaviors, and she is doing better with not giving into temptations.  Assessment/Plan:   1. Other depression, emotional eating binges Ellyn will continue with her diet and exercise, and emotional eating behavior strategies were discussed.  2. Obesity, Current BMI 38.2 Unique is currently in the action stage of change. As such, her goal is to continue with weight loss efforts. She has agreed to the [employer].   Dwight was offered encouragement and support, and I reassured her that she is doing well.  Exercise goals: As is.   Behavioral modification strategies: increasing lean protein intake and increasing water intake.  Makynleigh has agreed to follow-up with our clinic in 4 weeks. She was informed of the importance of frequent follow-up visits to maximize her success with intensive lifestyle modifications for her multiple health conditions.   Objective:   Blood pressure 114/78, pulse 83, temperature 98.3 F (36.8 C), height '5\' 6"'$  (1.676 m), weight 236 lb (107 kg), SpO2 98 %. Body  mass index is 38.09 kg/m.  General: Cooperative, alert, well developed, in no acute distress. HEENT: Conjunctivae and lids unremarkable. Cardiovascular: Regular rhythm.  Lungs: Normal work of breathing. Neurologic: No focal deficits.   Lab Results  Component Value Date   CREATININE 0.98 05/04/2021   BUN 17 05/04/2021   NA 141 05/04/2021   K 4.9 05/04/2021   CL 104 05/04/2021   CO2 24 05/04/2021   Lab Results  Component Value Date   ALT 15 05/04/2021   AST 18 05/04/2021   ALKPHOS 82 05/04/2021   BILITOT 0.3 05/04/2021   Lab Results  Component Value Date   HGBA1C 5.5 05/04/2021   HGBA1C 5.9 (H) 08/27/2020   HGBA1C 5.5 06/19/2019   HGBA1C 5.6 04/10/2018   HGBA1C 5.4 04/01/2017   Lab Results  Component Value Date   INSULIN 22.3 05/04/2021   INSULIN 28.0 (H) 08/27/2020   Lab Results  Component Value Date   TSH 0.934 05/04/2021   Lab Results  Component Value Date   CHOL 158 05/04/2021   HDL 54 05/04/2021   LDLCALC 76 05/04/2021   TRIG 166 (H) 05/04/2021   CHOLHDL 2.9 10/21/2020   Lab Results  Component Value Date   VD25OH 65.5 05/04/2021   VD25OH 45.7 08/27/2020   VD25OH 32 06/19/2019   Lab Results  Component Value Date   WBC 6.6 05/04/2021   HGB 15.6 05/04/2021   HCT 47.5 (H) 05/04/2021   MCV 93 05/04/2021   PLT 325 05/04/2021   No results found for: "IRON", "TIBC", "FERRITIN"  Attestation Statements:   Reviewed by clinician on day  of visit: allergies, medications, problem list, medical history, surgical history, family history, social history, and previous encounter notes.  Time spent on visit including pre-visit chart review and post-visit care and charting was 42 minutes.   I, Trixie Dredge, am acting as transcriptionist for Dennard Nip, MD.  I have reviewed the above documentation for accuracy and completeness, and I agree with the above. -  Dennard Nip, MD

## 2021-08-13 ENCOUNTER — Other Ambulatory Visit (HOSPITAL_COMMUNITY): Payer: Self-pay

## 2021-08-13 MED ORDER — NORETHIN ACE-ETH ESTRAD-FE 1.5-30 MG-MCG PO TABS
1.0000 | ORAL_TABLET | Freq: Every day | ORAL | 4 refills | Status: DC
  Filled 2021-08-13: qty 84, 84d supply, fill #0
  Filled 2021-11-07: qty 28, 28d supply, fill #0
  Filled 2021-11-16: qty 28, 28d supply, fill #1
  Filled 2021-12-20: qty 84, 84d supply, fill #2
  Filled 2022-03-21: qty 84, 84d supply, fill #0
  Filled 2022-06-08: qty 84, 84d supply, fill #1

## 2021-08-18 ENCOUNTER — Other Ambulatory Visit: Payer: Self-pay | Admitting: Internal Medicine

## 2021-08-18 ENCOUNTER — Other Ambulatory Visit (HOSPITAL_COMMUNITY): Payer: Self-pay

## 2021-08-18 MED ORDER — BENAZEPRIL-HYDROCHLOROTHIAZIDE 20-12.5 MG PO TABS
1.0000 | ORAL_TABLET | Freq: Every day | ORAL | 3 refills | Status: DC
  Filled 2021-08-18: qty 90, 90d supply, fill #0
  Filled 2021-10-06 – 2021-11-16 (×2): qty 90, 90d supply, fill #1
  Filled 2022-02-16: qty 90, 90d supply, fill #0
  Filled 2022-05-12: qty 90, 90d supply, fill #1

## 2021-08-19 ENCOUNTER — Encounter (INDEPENDENT_AMBULATORY_CARE_PROVIDER_SITE_OTHER): Payer: Self-pay

## 2021-08-24 ENCOUNTER — Ambulatory Visit (INDEPENDENT_AMBULATORY_CARE_PROVIDER_SITE_OTHER): Payer: No Typology Code available for payment source | Admitting: Family Medicine

## 2021-08-24 ENCOUNTER — Encounter (INDEPENDENT_AMBULATORY_CARE_PROVIDER_SITE_OTHER): Payer: Self-pay | Admitting: Family Medicine

## 2021-08-24 VITALS — BP 123/81 | HR 86 | Temp 98.5°F | Ht 66.0 in | Wt 237.0 lb

## 2021-08-24 DIAGNOSIS — Z6838 Body mass index (BMI) 38.0-38.9, adult: Secondary | ICD-10-CM

## 2021-08-24 DIAGNOSIS — E669 Obesity, unspecified: Secondary | ICD-10-CM

## 2021-08-24 DIAGNOSIS — I10 Essential (primary) hypertension: Secondary | ICD-10-CM

## 2021-08-31 ENCOUNTER — Ambulatory Visit (INDEPENDENT_AMBULATORY_CARE_PROVIDER_SITE_OTHER): Payer: No Typology Code available for payment source | Admitting: Family Medicine

## 2021-09-01 NOTE — Progress Notes (Unsigned)
Chief Complaint:   OBESITY Dana Washington is here to discuss her progress with her obesity treatment plan along with follow-up of her obesity related diagnoses. Dana Washington is on the [employer] and states she is following her eating plan approximately 85% of the time. Dana Washington states she is doing push ups, leg lifts, and on the elliptical 7 times per week.  Today's visit was #: 31 Starting weight: 273 lbs Starting date: 08/27/2020 Today's weight: 237 lbs Today's date: 08/24/2021 Total lbs lost to date: 36 Total lbs lost since last in-office visit: 0  Interim History: Dana Washington is retaining some fluid. She is doing well with weight loss overall. She is frustrated at how difficult this has been and we suspect that she is not eating enough, which has decreased her RMR.   Subjective:   1. Essential hypertension Mckay's blood pressure is well controlled, with no side effects noted.   Assessment/Plan:   1. Essential hypertension Dana Washington will continue with her diet, exercise, and Lotensin, and we will continue to follow.   2. Obesity, Current BMI 38.3 Dana Washington is currently in the action stage of change. As such, her goal is to continue with weight loss efforts. She has agreed to keeping a food journal and adhering to recommended goals of 1500 calories and 85+ grams of protein daily.   Changed to journaling, may need to recheck IC if no weight loss.   Exercise goals: As is.   Behavioral modification strategies: increasing lean protein intake and no skipping meals.  Dana Washington has agreed to follow-up with our clinic in 3 weeks. She was informed of the importance of frequent follow-up visits to maximize her success with intensive lifestyle modifications for her multiple health conditions.   Objective:   Blood pressure 123/81, pulse 86, temperature 98.5 F (36.9 C), height '5\' 6"'$  (1.676 m), weight 237 lb (107.5 kg), SpO2 98 %. Body mass index is 38.25 kg/m.  General: Cooperative, alert,  well developed, in no acute distress. HEENT: Conjunctivae and lids unremarkable. Cardiovascular: Regular rhythm.  Lungs: Normal work of breathing. Neurologic: No focal deficits.   Lab Results  Component Value Date   CREATININE 0.98 05/04/2021   BUN 17 05/04/2021   NA 141 05/04/2021   K 4.9 05/04/2021   CL 104 05/04/2021   CO2 24 05/04/2021   Lab Results  Component Value Date   ALT 15 05/04/2021   AST 18 05/04/2021   ALKPHOS 82 05/04/2021   BILITOT 0.3 05/04/2021   Lab Results  Component Value Date   HGBA1C 5.5 05/04/2021   HGBA1C 5.9 (H) 08/27/2020   HGBA1C 5.5 06/19/2019   HGBA1C 5.6 04/10/2018   HGBA1C 5.4 04/01/2017   Lab Results  Component Value Date   INSULIN 22.3 05/04/2021   INSULIN 28.0 (H) 08/27/2020   Lab Results  Component Value Date   TSH 0.934 05/04/2021   Lab Results  Component Value Date   CHOL 158 05/04/2021   HDL 54 05/04/2021   LDLCALC 76 05/04/2021   TRIG 166 (H) 05/04/2021   CHOLHDL 2.9 10/21/2020   Lab Results  Component Value Date   VD25OH 65.5 05/04/2021   VD25OH 45.7 08/27/2020   VD25OH 32 06/19/2019   Lab Results  Component Value Date   WBC 6.6 05/04/2021   HGB 15.6 05/04/2021   HCT 47.5 (H) 05/04/2021   MCV 93 05/04/2021   PLT 325 05/04/2021   No results found for: "IRON", "TIBC", "FERRITIN"  Attestation Statements:   Reviewed by clinician on  day of visit: allergies, medications, problem list, medical history, surgical history, family history, social history, and previous encounter notes.   I, Trixie Dredge, am acting as transcriptionist for Dennard Nip, MD.  I have reviewed the above documentation for accuracy and completeness, and I agree with the above. -  Dennard Nip, MD

## 2021-09-07 ENCOUNTER — Other Ambulatory Visit (HOSPITAL_COMMUNITY): Payer: Self-pay

## 2021-09-09 ENCOUNTER — Other Ambulatory Visit (HOSPITAL_COMMUNITY): Payer: Self-pay

## 2021-09-21 ENCOUNTER — Other Ambulatory Visit (HOSPITAL_COMMUNITY): Payer: Self-pay

## 2021-09-21 ENCOUNTER — Encounter (INDEPENDENT_AMBULATORY_CARE_PROVIDER_SITE_OTHER): Payer: Self-pay | Admitting: Family Medicine

## 2021-09-21 ENCOUNTER — Ambulatory Visit (INDEPENDENT_AMBULATORY_CARE_PROVIDER_SITE_OTHER): Payer: No Typology Code available for payment source | Admitting: Family Medicine

## 2021-09-21 VITALS — BP 129/84 | HR 81 | Temp 98.0°F | Ht 66.0 in | Wt 237.0 lb

## 2021-09-21 DIAGNOSIS — Z6838 Body mass index (BMI) 38.0-38.9, adult: Secondary | ICD-10-CM | POA: Diagnosis not present

## 2021-09-21 DIAGNOSIS — E669 Obesity, unspecified: Secondary | ICD-10-CM | POA: Diagnosis not present

## 2021-09-21 DIAGNOSIS — F3289 Other specified depressive episodes: Secondary | ICD-10-CM | POA: Diagnosis not present

## 2021-09-22 ENCOUNTER — Other Ambulatory Visit (HOSPITAL_COMMUNITY): Payer: Self-pay

## 2021-09-25 NOTE — Progress Notes (Unsigned)
     Chief Complaint:   OBESITY Dana Washington is here to discuss her progress with her obesity treatment plan along with follow-up of her obesity related diagnoses. Dana Washington is on {MWMwtlossportion/plan2:23431} and states she is following her eating plan approximately ***% of the time. [name] states she is *** *** minutes *** times per week.  Today's visit was #: *** Starting weight: *** Starting date: *** Today's weight: *** Today's date: 09/21/2021 Total lbs lost to date: *** Total lbs lost since last in-office visit: ***  Interim History: ***  Subjective:   1. Other depression, emotional eating behaviors ***  Assessment/Plan:   1. Other depression, emotional eating behaviors ***  2. Obesity, Current BMI 38.3 [place] {CHL AMB IS/IS NOT:210130109} currently in the action stage of change. As such, her goal is to {MWMwtloss#1:210800005}. She has agreed to {MWMwtlossportion/plan2:23431}.   We will recheck fasting IC at her next visit.  Exercise goals: {MWM EXERCISE RECS:23473}  Behavioral modification strategies: {MWMwtlossdietstrategies3:23432}.  Halynn has agreed to follow-up with our clinic in {NUMBER 1-10:22536} weeks. She was informed of the importance of frequent follow-up visits to maximize her success with intensive lifestyle modifications for her multiple health conditions.   Objective:   Blood pressure 129/84, pulse 81, temperature 98 F (36.7 C), height '5\' 6"'$  (1.676 m), weight 237 lb (107.5 kg), SpO2 98 %. Body mass index is 38.25 kg/m.  General: Cooperative, alert, well developed, in no acute distress. HEENT: Conjunctivae and lids unremarkable. Cardiovascular: Regular rhythm.  Lungs: Normal work of breathing. Neurologic: No focal deficits.   Lab Results  Component Value Date   CREATININE 0.98 05/04/2021   BUN 17 05/04/2021   NA 141 05/04/2021   K 4.9 05/04/2021   CL 104 05/04/2021   CO2 24 05/04/2021   Lab Results  Component Value Date   ALT 15  05/04/2021   AST 18 05/04/2021   ALKPHOS 82 05/04/2021   BILITOT 0.3 05/04/2021   Lab Results  Component Value Date   HGBA1C 5.5 05/04/2021   HGBA1C 5.9 (H) 08/27/2020   HGBA1C 5.5 06/19/2019   HGBA1C 5.6 04/10/2018   HGBA1C 5.4 04/01/2017   Lab Results  Component Value Date   INSULIN 22.3 05/04/2021   INSULIN 28.0 (H) 08/27/2020   Lab Results  Component Value Date   TSH 0.934 05/04/2021   Lab Results  Component Value Date   CHOL 158 05/04/2021   HDL 54 05/04/2021   LDLCALC 76 05/04/2021   TRIG 166 (H) 05/04/2021   CHOLHDL 2.9 10/21/2020   Lab Results  Component Value Date   VD25OH 65.5 05/04/2021   VD25OH 45.7 08/27/2020   VD25OH 32 06/19/2019   Lab Results  Component Value Date   WBC 6.6 05/04/2021   HGB 15.6 05/04/2021   HCT 47.5 (H) 05/04/2021   MCV 93 05/04/2021   PLT 325 05/04/2021   No results found for: "IRON", "TIBC", "FERRITIN"  Attestation Statements:   Reviewed by clinician on day of visit: allergies, medications, problem list, medical history, surgical history, family history, social history, and previous encounter notes.  Time spent on visit including pre-visit chart review and post-visit care and charting was 30 minutes.   I, Trixie Dredge, am acting as transcriptionist for Dennard Nip, MD.  I have reviewed the above documentation for accuracy and completeness, and I agree with the above. -  ***

## 2021-10-06 ENCOUNTER — Other Ambulatory Visit (HOSPITAL_COMMUNITY): Payer: Self-pay

## 2021-10-10 ENCOUNTER — Other Ambulatory Visit (HOSPITAL_COMMUNITY): Payer: Self-pay

## 2021-11-02 ENCOUNTER — Encounter (INDEPENDENT_AMBULATORY_CARE_PROVIDER_SITE_OTHER): Payer: Self-pay | Admitting: Family Medicine

## 2021-11-02 ENCOUNTER — Other Ambulatory Visit (HOSPITAL_COMMUNITY): Payer: Self-pay

## 2021-11-02 ENCOUNTER — Ambulatory Visit (INDEPENDENT_AMBULATORY_CARE_PROVIDER_SITE_OTHER): Payer: No Typology Code available for payment source | Admitting: Family Medicine

## 2021-11-02 VITALS — BP 116/76 | HR 76 | Temp 98.1°F | Ht 66.0 in | Wt 237.0 lb

## 2021-11-02 DIAGNOSIS — E669 Obesity, unspecified: Secondary | ICD-10-CM

## 2021-11-02 DIAGNOSIS — R0602 Shortness of breath: Secondary | ICD-10-CM | POA: Diagnosis not present

## 2021-11-02 DIAGNOSIS — Z6838 Body mass index (BMI) 38.0-38.9, adult: Secondary | ICD-10-CM

## 2021-11-02 DIAGNOSIS — E88819 Insulin resistance, unspecified: Secondary | ICD-10-CM | POA: Diagnosis not present

## 2021-11-02 DIAGNOSIS — K5909 Other constipation: Secondary | ICD-10-CM | POA: Diagnosis not present

## 2021-11-02 MED ORDER — WEGOVY 0.25 MG/0.5ML ~~LOC~~ SOAJ
0.2500 mg | SUBCUTANEOUS | 0 refills | Status: DC
  Filled 2021-11-02: qty 2, 28d supply, fill #0

## 2021-11-02 MED ORDER — POLYETHYLENE GLYCOL 3350 17 GM/SCOOP PO POWD
17.0000 g | Freq: Every day | ORAL | 0 refills | Status: AC
  Filled 2021-11-02: qty 714, 42d supply, fill #0
  Filled 2021-11-07 – 2021-11-16 (×3): qty 476, 28d supply, fill #0
  Filled 2021-12-04: qty 476, 28d supply, fill #1

## 2021-11-03 ENCOUNTER — Other Ambulatory Visit: Payer: No Typology Code available for payment source

## 2021-11-03 DIAGNOSIS — E7849 Other hyperlipidemia: Secondary | ICD-10-CM

## 2021-11-03 DIAGNOSIS — E038 Other specified hypothyroidism: Secondary | ICD-10-CM

## 2021-11-03 DIAGNOSIS — E7800 Pure hypercholesterolemia, unspecified: Secondary | ICD-10-CM

## 2021-11-03 DIAGNOSIS — R7302 Impaired glucose tolerance (oral): Secondary | ICD-10-CM

## 2021-11-03 DIAGNOSIS — I10 Essential (primary) hypertension: Secondary | ICD-10-CM

## 2021-11-04 LAB — CBC WITH DIFFERENTIAL/PLATELET
Absolute Monocytes: 454 cells/uL (ref 200–950)
Basophils Absolute: 69 cells/uL (ref 0–200)
Basophils Relative: 1.1 %
Eosinophils Absolute: 38 cells/uL (ref 15–500)
Eosinophils Relative: 0.6 %
HCT: 47.9 % — ABNORMAL HIGH (ref 35.0–45.0)
Hemoglobin: 16.2 g/dL — ABNORMAL HIGH (ref 11.7–15.5)
Lymphs Abs: 1323 cells/uL (ref 850–3900)
MCH: 31 pg (ref 27.0–33.0)
MCHC: 33.8 g/dL (ref 32.0–36.0)
MCV: 91.8 fL (ref 80.0–100.0)
MPV: 9.7 fL (ref 7.5–12.5)
Monocytes Relative: 7.2 %
Neutro Abs: 4416 cells/uL (ref 1500–7800)
Neutrophils Relative %: 70.1 %
Platelets: 395 10*3/uL (ref 140–400)
RBC: 5.22 10*6/uL — ABNORMAL HIGH (ref 3.80–5.10)
RDW: 13 % (ref 11.0–15.0)
Total Lymphocyte: 21 %
WBC: 6.3 10*3/uL (ref 3.8–10.8)

## 2021-11-04 LAB — TSH: TSH: 0.72 mIU/L

## 2021-11-04 LAB — COMPLETE METABOLIC PANEL WITH GFR
AG Ratio: 1.5 (calc) (ref 1.0–2.5)
ALT: 16 U/L (ref 6–29)
AST: 17 U/L (ref 10–35)
Albumin: 4.6 g/dL (ref 3.6–5.1)
Alkaline phosphatase (APISO): 63 U/L (ref 31–125)
BUN: 13 mg/dL (ref 7–25)
CO2: 25 mmol/L (ref 20–32)
Calcium: 9.8 mg/dL (ref 8.6–10.2)
Chloride: 104 mmol/L (ref 98–110)
Creat: 0.88 mg/dL (ref 0.50–0.99)
Globulin: 3 g/dL (calc) (ref 1.9–3.7)
Glucose, Bld: 95 mg/dL (ref 65–99)
Potassium: 4.8 mmol/L (ref 3.5–5.3)
Sodium: 140 mmol/L (ref 135–146)
Total Bilirubin: 0.3 mg/dL (ref 0.2–1.2)
Total Protein: 7.6 g/dL (ref 6.1–8.1)
eGFR: 81 mL/min/{1.73_m2} (ref >=60)

## 2021-11-04 LAB — LIPID PANEL
Cholesterol: 157 mg/dL (ref <200)
HDL: 63 mg/dL (ref >=50)
LDL Cholesterol (Calc): 77 mg/dL (calc)
Non-HDL Cholesterol (Calc): 94 mg/dL (calc) (ref <130)
Total CHOL/HDL Ratio: 2.5 (calc) (ref <5.0)
Triglycerides: 91 mg/dL (ref <150)

## 2021-11-04 LAB — HEMOGLOBIN A1C
Hgb A1c MFr Bld: 5.6 % of total Hgb (ref <5.7)
Mean Plasma Glucose: 114 mg/dL
eAG (mmol/L): 6.3 mmol/L

## 2021-11-04 LAB — MICROALBUMIN, URINE: Microalb, Ur: <0.2 mg/dL

## 2021-11-06 ENCOUNTER — Encounter: Payer: Self-pay | Admitting: Internal Medicine

## 2021-11-06 ENCOUNTER — Other Ambulatory Visit (HOSPITAL_COMMUNITY): Payer: Self-pay

## 2021-11-06 ENCOUNTER — Ambulatory Visit (INDEPENDENT_AMBULATORY_CARE_PROVIDER_SITE_OTHER): Payer: No Typology Code available for payment source | Admitting: Internal Medicine

## 2021-11-06 VITALS — BP 106/70 | HR 77 | Temp 97.3°F | Ht 65.5 in | Wt 246.0 lb

## 2021-11-06 DIAGNOSIS — Z8639 Personal history of other endocrine, nutritional and metabolic disease: Secondary | ICD-10-CM

## 2021-11-06 DIAGNOSIS — Z6841 Body Mass Index (BMI) 40.0 and over, adult: Secondary | ICD-10-CM

## 2021-11-06 DIAGNOSIS — E88819 Insulin resistance, unspecified: Secondary | ICD-10-CM | POA: Diagnosis not present

## 2021-11-06 DIAGNOSIS — R7302 Impaired glucose tolerance (oral): Secondary | ICD-10-CM

## 2021-11-06 DIAGNOSIS — Z0000 Encounter for general adult medical examination without abnormal findings: Secondary | ICD-10-CM

## 2021-11-06 DIAGNOSIS — E785 Hyperlipidemia, unspecified: Secondary | ICD-10-CM | POA: Diagnosis not present

## 2021-11-06 DIAGNOSIS — I10 Essential (primary) hypertension: Secondary | ICD-10-CM | POA: Diagnosis not present

## 2021-11-06 DIAGNOSIS — E038 Other specified hypothyroidism: Secondary | ICD-10-CM

## 2021-11-06 NOTE — Progress Notes (Unsigned)
   Subjective:    Patient ID: Dana Washington, female    DOB: August 02, 1975, 46 y.o.   MRN: [id number]  HPI 46 year old Female seen for health maintenance exam and evaluation of medical issues.  She has a history of essential hypertension, hyperlipidemia, obesity, metabolic syndrome, impaired glucose tolerance, migraine headaches and hypothyroidism.  History of functional constipation and has been seen by Dr. Carlean Purl. Her labs I have are reviewed today and are excellent including hemoglobin A1c, lipid panel, c-Met and TSH.  She is being seen at [facility] healthy weight by Dr. Leafy Ro.  Note from Dr. Leafy Ro in July indicate patient started there weighing 273 pounds and as of July 2023 weight 236 pounds.  Currently weighing 246 pounds.  Past medical history: Dr. Georgette Dover removed a lipoma from her left upper abdominal wall in April 2019.  GE reflux treated with Nexium.  Hypothyroidism treated with thyroid replacement medication and stable.  Had breast reduction surgery in May 2010.  Bilateral carpal tunnel release February 2012.  LASEK surgery 2008 for myopia.  History of Hashimoto's thyroiditis with resultant hypothyroidism.  History of vitamin D deficiency.  Benign lymph node biopsy February 2010.  History of seroma left breast which was resulted from breast reduction surgery in 2010.  History of 20% rotator cuff tear and osteoarthritis AC joint treated surgically by Dr. Theda Sers in September 2014 with partial clavicle resection.  She also had a glenoid labral tear history of urticaria February 2018 treated with steroids, Zyrtec and Zantac.  History of situational stress at work that has improved some.   Review of Systems migraines are stable.  Anxiety and depression are stable.     Objective:   Physical Exam Blood pressure excellent at 106/70 ,pulse 77 regular, pulse oximetry 99%, weight 246 pounds, BMI 40.31, temperature 97.3 degrees by ear thermometer Skin: Warm and dry.  No cervical adenopathy.   No thyromegaly.  Chest clear.  Cardiac exam: Regular rate and rhythm without murmur or ectopy.  Abdomen is soft nondistended without hepatosplenomegaly, masses, or tenderness.  No lower extremity pitting edema.  Brief neurological exam shows no focal deficits.  Her affect, thought and judgment are normal.  GYN exam is deferred to GYN physician.      Assessment & Plan:  BMI 40.31-continue with [facility] which has been helpful  Situational stress at work-has improved some.  Continue Prozac and antianxiety medication.  History of Hashimoto's thyroiditis with resultant hypothyroidism-stable on thyroid replacement medication.  TSH is excellent at 0.72  History of vitamin D deficiency this was checked at a healthy weight clinic in April and was stable at 65.5  History of impaired glucose tolerance-1 year ago hemoglobin A1c was 5.9% and is now 5.6%  History of breast reduction surgery in 2010 with development of seroma left breast as a result of the surgery  Migraine headaches treated with Topamax  History of shoulder surgery with Dr. Theda Sers in 2014  History of urticaria in 2018  GE reflux treated with PPI  History of essential hypertension treated with Lotensin HCT  Mixed hyperlipidemia treated with statin and lipids are normal  Plan: She will continue with current medications.  Had flu vaccine through employment.  Needs to have tetanus up-to-date through employee health.  Had recent COVID booster.  Had colonoscopy 2023 with 5-year follow-up recommended.  Continue with [facility] which has been helpful.  Return in 1 year or as needed.

## 2021-11-07 ENCOUNTER — Other Ambulatory Visit (HOSPITAL_COMMUNITY): Payer: Self-pay

## 2021-11-07 NOTE — Patient Instructions (Signed)
It was a pleasure to see you today.  Labs are stable.  Continue your progress with Dr. Leafy Ro.  Recommend tetanus immunization through employee health.  Colonoscopy is up-to-date.  Continue with annual mammography.  Return in 1 year or as needed.

## 2021-11-08 NOTE — Progress Notes (Signed)
Chief Complaint:   OBESITY Dana Washington is here to discuss her progress with her obesity treatment plan along with follow-up of her obesity related diagnoses. Dana Washington is on keeping a food journal and adhering to recommended goals of 1500 calories and 85+ grams of protein daily and states she is following her eating plan approximately 78% of the time. Dana Washington states she is on the elliptical, doing squats, and push ups for various minutes 7 times per week.  Today's visit was #: 19 Starting weight: 273 lbs Starting date: 08/27/2020 Today's weight: 237 lbs Today's date: 11/02/2021 Total lbs lost to date: 36 Total lbs lost since last in-office visit: 0  Interim History: Dana Washington is struggling with weight loss.  She is doing well with exercise and she is going in between category 3 and journaling.  She has been hesitant to start weight loss medications in the past, but she is open to this now.  Subjective:   1. Insulin resistance Dana Washington's last fasting insulin level was elevated which is likely contributing to her struggles with weight loss.  2. Other constipation Dana Washington has a history of less frequent BMs and she is starting [facility], so this has a risk of worsening.  3. SOBOE (shortness of breath on exertion) Dana Washington is working on weight loss and exercise, and her symptoms have not worsened.  Assessment/Plan:   1. Insulin resistance Dana Washington will start Wegovy, and she will continue to work on her diet and exercise.  2. Other constipation Dana Washington agreed to start MiraLAX 17 g once daily with no refills.  We will follow-up at her next visit.  - polyethylene glycol powder (GLYCOLAX/MIRALAX) 17 GM/SCOOP powder; Take 17 g by mouth daily for 30 doses.  Dispense: 510 g; Refill: 0  3. SOBOE (shortness of breath on exertion) Dana Washington's repeat IC shows RMR has improved.  4. Obesity, Current BMI 38.3 Dana Washington is currently in the action stage of change. As such, her goal is to continue with weight  loss efforts. She has agreed to the Category 3 Plan or keeping a food journal and adhering to recommended goals of 1500 calories and 85+ grams of protein daily.   We discussed various medication options to help Dana Washington with her weight loss efforts and we both agreed to start Wegovy 0.25 mg once weekly with no refills.  - Semaglutide-Weight Management (WEGOVY) 0.25 MG/0.5ML SOAJ; Inject 0.25 mg into the skin once a week.  Dispense: 2 mL; Refill: 0  Exercise goals: As is.   Behavioral modification strategies: increasing water intake and keeping a strict food journal.  Dana Washington has agreed to follow-up with our clinic in 3 to 4 weeks. She was informed of the importance of frequent follow-up visits to maximize her success with intensive lifestyle modifications for her multiple health conditions.   Objective:   Blood pressure 116/76, pulse 76, temperature 98.1 F (36.7 C), height 5\' 6"  (1.676 m), weight 237 lb (107.5 kg), SpO2 99 %. Body mass index is 38.25 kg/m.  General: Cooperative, alert, well developed, in no acute distress. HEENT: Conjunctivae and lids unremarkable. Cardiovascular: Regular rhythm.  Lungs: Normal work of breathing. Neurologic: No focal deficits.   Lab Results  Component Value Date   CREATININE 0.88 11/03/2021   BUN 13 11/03/2021   NA 140 11/03/2021   K 4.8 11/03/2021   CL 104 11/03/2021   CO2 25 11/03/2021   Lab Results  Component Value Date   ALT 16 11/03/2021   AST 17 11/03/2021   ALKPHOS 82  05/04/2021   BILITOT 0.3 11/03/2021   Lab Results  Component Value Date   HGBA1C 5.6 11/03/2021   HGBA1C 5.5 05/04/2021   HGBA1C 5.9 (H) 08/27/2020   HGBA1C 5.5 06/19/2019   HGBA1C 5.6 04/10/2018   Lab Results  Component Value Date   INSULIN 22.3 05/04/2021   INSULIN 28.0 (H) 08/27/2020   Lab Results  Component Value Date   TSH 0.72 11/03/2021   Lab Results  Component Value Date   CHOL 157 11/03/2021   HDL 63 11/03/2021   LDLCALC 77 11/03/2021   TRIG  91 11/03/2021   CHOLHDL 2.5 11/03/2021   Lab Results  Component Value Date   VD25OH 65.5 05/04/2021   VD25OH 45.7 08/27/2020   VD25OH 32 06/19/2019   Lab Results  Component Value Date   WBC 6.3 11/03/2021   HGB 16.2 (H) 11/03/2021   HCT 47.9 (H) 11/03/2021   MCV 91.8 11/03/2021   PLT 395 11/03/2021   No results found for: "IRON", "TIBC", "FERRITIN"  Attestation Statements:   Reviewed by clinician on day of visit: allergies, medications, problem list, medical history, surgical history, family history, social history, and previous encounter notes.   I, Burt Knack, am acting as transcriptionist for Quillian Quince, MD.  I have reviewed the above documentation for accuracy and completeness, and I agree with the above. -  Quillian Quince, MD

## 2021-11-09 ENCOUNTER — Other Ambulatory Visit (HOSPITAL_COMMUNITY): Payer: Self-pay

## 2021-11-09 ENCOUNTER — Other Ambulatory Visit: Payer: Self-pay | Admitting: Internal Medicine

## 2021-11-09 ENCOUNTER — Encounter (INDEPENDENT_AMBULATORY_CARE_PROVIDER_SITE_OTHER): Payer: Self-pay

## 2021-11-09 MED ORDER — ALPRAZOLAM 0.5 MG PO TABS
0.5000 mg | ORAL_TABLET | Freq: Two times a day (BID) | ORAL | 1 refills | Status: DC | PRN
  Filled 2021-11-09 – 2022-02-16 (×2): qty 60, 30d supply, fill #0

## 2021-11-12 ENCOUNTER — Other Ambulatory Visit (HOSPITAL_COMMUNITY): Payer: Self-pay

## 2021-11-16 ENCOUNTER — Other Ambulatory Visit (HOSPITAL_COMMUNITY): Payer: Self-pay

## 2021-11-17 ENCOUNTER — Other Ambulatory Visit (HOSPITAL_COMMUNITY): Payer: Self-pay

## 2021-11-18 ENCOUNTER — Other Ambulatory Visit (HOSPITAL_COMMUNITY): Payer: Self-pay

## 2021-11-21 ENCOUNTER — Other Ambulatory Visit (HOSPITAL_COMMUNITY): Payer: Self-pay

## 2021-11-26 ENCOUNTER — Other Ambulatory Visit (HOSPITAL_COMMUNITY): Payer: Self-pay

## 2021-11-30 ENCOUNTER — Ambulatory Visit (INDEPENDENT_AMBULATORY_CARE_PROVIDER_SITE_OTHER): Payer: No Typology Code available for payment source | Admitting: Family Medicine

## 2021-11-30 ENCOUNTER — Encounter (INDEPENDENT_AMBULATORY_CARE_PROVIDER_SITE_OTHER): Payer: Self-pay | Admitting: Family Medicine

## 2021-11-30 ENCOUNTER — Other Ambulatory Visit: Payer: Self-pay | Admitting: Internal Medicine

## 2021-11-30 DIAGNOSIS — Z1231 Encounter for screening mammogram for malignant neoplasm of breast: Secondary | ICD-10-CM

## 2021-12-04 ENCOUNTER — Other Ambulatory Visit: Payer: Self-pay | Admitting: Internal Medicine

## 2021-12-04 ENCOUNTER — Other Ambulatory Visit (HOSPITAL_COMMUNITY): Payer: Self-pay

## 2021-12-04 ENCOUNTER — Other Ambulatory Visit (INDEPENDENT_AMBULATORY_CARE_PROVIDER_SITE_OTHER): Payer: Self-pay | Admitting: Family Medicine

## 2021-12-04 DIAGNOSIS — K5909 Other constipation: Secondary | ICD-10-CM

## 2021-12-04 MED ORDER — SYNTHROID 100 MCG PO TABS
100.0000 ug | ORAL_TABLET | Freq: Every day | ORAL | 1 refills | Status: DC
  Filled 2021-12-04 – 2022-03-02 (×2): qty 90, 90d supply, fill #0

## 2021-12-17 ENCOUNTER — Ambulatory Visit (INDEPENDENT_AMBULATORY_CARE_PROVIDER_SITE_OTHER): Payer: No Typology Code available for payment source | Admitting: Internal Medicine

## 2021-12-17 ENCOUNTER — Encounter: Payer: Self-pay | Admitting: Internal Medicine

## 2021-12-17 ENCOUNTER — Other Ambulatory Visit (HOSPITAL_COMMUNITY): Payer: Self-pay

## 2021-12-17 ENCOUNTER — Telehealth: Payer: Self-pay | Admitting: Internal Medicine

## 2021-12-17 VITALS — BP 118/76 | HR 93 | Temp 98.4°F | Ht 65.5 in | Wt 252.0 lb

## 2021-12-17 DIAGNOSIS — G43901 Migraine, unspecified, not intractable, with status migrainosus: Secondary | ICD-10-CM | POA: Diagnosis not present

## 2021-12-17 MED ORDER — OXYCODONE-ACETAMINOPHEN 10-325 MG PO TABS
1.0000 | ORAL_TABLET | Freq: Three times a day (TID) | ORAL | 0 refills | Status: AC | PRN
  Filled 2021-12-17: qty 15, 5d supply, fill #0

## 2021-12-17 MED ORDER — ONDANSETRON HCL 4 MG PO TABS
4.0000 mg | ORAL_TABLET | Freq: Three times a day (TID) | ORAL | 1 refills | Status: DC | PRN
  Filled 2021-12-17: qty 20, 7d supply, fill #0

## 2021-12-17 NOTE — Patient Instructions (Signed)
A referral will be made to [facility] regarding management of future migraine headaches.  Patient will take oxycodone APAP 10/325 sparingly to try to eliminate the current protracted migraine headache.

## 2021-12-17 NOTE — Progress Notes (Signed)
      Subjective:    Patient ID: Dana Washington, female    DOB: [birth year], 46 y.o.   MRN: [id number]  HPI 46 year old with hx of migraine  headaches for 2 days. She has a longstanding history of migraine headaches. For 2 days, her head has been pounding and waking her up at night. Takes Topamax for migraine prophylaxis.She is not sure what triggered this migraine. Does not seem to be related tp food or menses.   She took Norco 10/325 tablet on  Tuesday night and another dose Wednesday night. Still has the headache. No vomiting. Is fatigued from the headache not going away. Still has tried to work during this time.   Also, she is going to [place] in Feb on a mission trip and says she will be seeing [facility] for travel advice she says. Needs Tdap update.  She has a history of essential hypertension, hyperlipidemia, obesity, metabolic syndrome, impaired glucose tolerance and hypothyroidism in addition to migraine headaches.  History of functional constipation and has been seen by Dr. Carlean Purl.  For past medical history and family history see health maintenance exam completed on 11/06/2021    Review of Systems factors aggravating migraine include she recently had a garage door break,  has work stress.  She reports no fever, chills, but  has felt nauseated.     Objective:   Physical Exam  T 98.4 degrees, BP 118/76, pulse 93 regular temperature 98.4 degrees pulse oximetry 99% weight 252 pounds BMI 41.30 Skin: Warm and dry.  TMs and pharynx are clear.  Neck is supple.  No thyromegaly.  Chest clear.  Brief neurological exam is intact without gross focal deficits.  Affect follow-up and judgment appear to be normal.     Assessment & Plan:  Protracted migraine headache for several days  Plan: Steroid taper was offered but she declined this.  She will take oxycodone APAP 10/325 sparingly to try to eliminate the protracted migraine.  I think she would benefit from neurology  consultation for management of these migraines.  A referral will be made.

## 2021-12-17 NOTE — Telephone Encounter (Signed)
Dana Washington called said she has had migraine for 3 days, she was wanting medicine called in, I let her know she would need to be seen first. Scheduled for 4:00 today

## 2021-12-20 ENCOUNTER — Other Ambulatory Visit: Payer: Self-pay | Admitting: Internal Medicine

## 2021-12-20 MED ORDER — TOPIRAMATE 100 MG PO TABS
100.0000 mg | ORAL_TABLET | Freq: Every day | ORAL | 3 refills | Status: DC
  Filled 2021-12-20: qty 90, 90d supply, fill #0

## 2021-12-21 ENCOUNTER — Ambulatory Visit (INDEPENDENT_AMBULATORY_CARE_PROVIDER_SITE_OTHER): Payer: No Typology Code available for payment source | Admitting: Family Medicine

## 2021-12-21 ENCOUNTER — Other Ambulatory Visit (HOSPITAL_COMMUNITY): Payer: Self-pay

## 2021-12-22 ENCOUNTER — Other Ambulatory Visit (HOSPITAL_COMMUNITY): Payer: Self-pay

## 2021-12-23 ENCOUNTER — Encounter: Payer: Self-pay | Admitting: Neurology

## 2021-12-23 ENCOUNTER — Encounter (INDEPENDENT_AMBULATORY_CARE_PROVIDER_SITE_OTHER): Payer: Self-pay | Admitting: Family Medicine

## 2021-12-23 ENCOUNTER — Ambulatory Visit: Payer: No Typology Code available for payment source | Admitting: Neurology

## 2021-12-23 ENCOUNTER — Ambulatory Visit (INDEPENDENT_AMBULATORY_CARE_PROVIDER_SITE_OTHER): Payer: No Typology Code available for payment source | Admitting: Family Medicine

## 2021-12-23 ENCOUNTER — Other Ambulatory Visit (HOSPITAL_COMMUNITY): Payer: Self-pay

## 2021-12-23 VITALS — BP 133/84 | HR 97 | Ht 66.0 in | Wt 249.0 lb

## 2021-12-23 VITALS — BP 121/79 | HR 72 | Temp 98.1°F | Ht 66.0 in | Wt 245.0 lb

## 2021-12-23 DIAGNOSIS — F3289 Other specified depressive episodes: Secondary | ICD-10-CM

## 2021-12-23 DIAGNOSIS — E669 Obesity, unspecified: Secondary | ICD-10-CM | POA: Diagnosis not present

## 2021-12-23 DIAGNOSIS — G43009 Migraine without aura, not intractable, without status migrainosus: Secondary | ICD-10-CM | POA: Diagnosis not present

## 2021-12-23 DIAGNOSIS — G43709 Chronic migraine without aura, not intractable, without status migrainosus: Secondary | ICD-10-CM | POA: Insufficient documentation

## 2021-12-23 DIAGNOSIS — Z6839 Body mass index (BMI) 39.0-39.9, adult: Secondary | ICD-10-CM

## 2021-12-23 MED ORDER — NURTEC 75 MG PO TBDP: 75.0000 mg | ORAL_TABLET | Freq: Every day | ORAL | 0 refills | Status: DC | PRN

## 2021-12-23 MED ORDER — HYDROCODONE-ACETAMINOPHEN 5-325 MG PO TABS
1.0000 | ORAL_TABLET | Freq: Four times a day (QID) | ORAL | 0 refills | Status: DC | PRN
  Filled 2021-12-23: qty 30, 8d supply, fill #0

## 2021-12-23 MED ORDER — ZEPBOUND 2.5 MG/0.5ML ~~LOC~~ SOAJ
2.5000 mg | SUBCUTANEOUS | 0 refills | Status: DC
  Filled 2021-12-23 – 2022-01-27 (×3): qty 2, 28d supply, fill #0

## 2021-12-23 MED ORDER — TOPIRAMATE 100 MG PO TABS
100.0000 mg | ORAL_TABLET | Freq: Every day | ORAL | 0 refills | Status: DC
  Filled 2021-12-23 – 2022-02-03 (×2): qty 30, 30d supply, fill #0

## 2021-12-23 NOTE — Patient Instructions (Addendum)
It sounds more like you have a prodrome as opposed to an aura At your prodrome start taking nurtec daily OR take nurtec at onset of migraine or during migraine May take nurtec with ondansetron And if Vicodin in the past has helped, you are comfortable with it, don't abuse it and use it 1-2x a year I will provide that Nurtec once daily as needed  There is increased risk for stroke in women with migraine with aura and a contraindication for the combined contraceptive pill for use by women who have migraine with aura. The risk for women with migraine without aura is lower. However other risk factors like smoking are far more likely to increase stroke risk than migraine. There is a recommendation for no smoking and for the use of OCPs without estrogen such as progestogen only pills particularly for women with migraine with aura.Marland Kitchen People who have migraine headaches with auras may be 3 times more likely to have a stroke caused by a blood clot, compared to migraine patients who don't see auras. Women who take hormone-replacement therapy may be 30 percent more likely to suffer a clot-based stroke than women not taking medication containing estrogen. Other risk factors like smoking and high blood pressure may be  much more important.  Acetaminophen; Hydrocodone Capsules or Tablets What is this medication? ACETAMINOPHEN; HYDROCODONE (a set a MEE noe fen; hye droe KOE done) treats moderate pain. It is prescribed when other pain medications have not worked or cannot be tolerated. It works by blocking pain signals in the brain. This medication is a combination of acetaminophen and an opioid. This medicine may be used for other purposes; ask your health care provider or pharmacist if you have questions. COMMON BRAND NAME(S): Anexsia, Bancap HC, Ceta-Plus, Co-Gesic, Comfortpak, Dolagesic, [employer] [facility] [employer], DuoCet, Hydrocet, Hydrogesic, [place]aden, Lorcet HD, Lorcet Plus, Lortab, Margesic H, Maxidone, [place], Polygesic,  [place], [place], [place], Vicodin, Vicodin ES, Vicodin HP, Charlane Ferretti What should I tell my care team before I take this medication? They need to know if you have any of these conditions: Brain tumor Frequently drink alcohol Head injury Heart disease Kidney disease Liver disease Low adrenal gland function Lung or breathing disease, such as asthma or COPD Seizures Stomach or intestine problems Substance use disorder Taken an MAOI, such as Marplan, Nardil, or Parnate in the last 14 days An unusual or allergic reaction to acetaminophen, hydrocodone, other medications, foods, dyes, or preservatives Pregnant or trying to get pregnant Breastfeeding How should I use this medication? Take this medication by mouth with a glass of water. Take it as directed on the prescription label. You can take it with or without food. If it upsets your stomach, take it with food. Do not take your medication more often than directed. A special MedGuide will be given to you by the pharmacist with each prescription and refill. Be sure to read this information carefully each time. Talk to your care team about the use of this medication in children. Special care may be needed. Overdosage: If you think you have taken too much of this medicine contact a poison control center or emergency room at once. NOTE: This medicine is only for you. Do not share this medicine with others. What if I miss a dose? If you miss a dose, take it as soon as you can. If it is almost time for your next dose, take only that dose. Do not take double or extra doses. What may interact with this medication? Alcohol Antihistamines for allergy,  cough, and cold Antiviral medications for HIV or AIDS Atropine Certain antibiotics, such as erythromycin, clarithromycin Certain medications for anxiety or sleep Certain medications for bladder problems, such as oxybutynin, tolterodine Certain medications for depression, such as amitriptyline,  fluoxetine, sertraline Certain medications for fungal infections, such as ketoconazole and itraconazole Certain medications for Parkinson disease, such as benztropine, trihexyphenidyl Certain medications for seizures, such as carbamazepine, phenobarbital, phenytoin, primidone Certain medications for stomach problems, such as dicyclomine, hyoscyamine Certain medications for travel sickness, such as scopolamine General anesthetics, such as halothane, isoflurane, methoxyflurane, propofol Ipratropium Local anesthetics, such as lidocaine, pramoxine, tetracaine MAOIs, such as Carbex, Eldepryl, Marplan, Nardil, and Parnate Medications that relax muscles for surgery Other medications with acetaminophen Other opioid medications for pain or cough Phenothiazines, such as chlorpromazine, mesoridazine, prochlorperazine, thioridazine Rifampin This list may not describe all possible interactions. Give your health care provider a list of all the medicines, herbs, non-prescription drugs, or dietary supplements you use. Also tell them if you smoke, drink alcohol, or use illegal drugs. Some items may interact with your medicine. What should I watch for while using this medication? Tell your care team if your pain does not go away, if it gets worse, or if you have new or a different type of pain. You may develop tolerance to this medication. Tolerance means that you will need a higher dose of the medication for pain relief. Tolerance is normal and is expected if you take this medication for a long time. Taking this medication with other substances that cause drowsiness, such as alcohol, benzodiazepines, or other opioids can cause serious side effects. Give your care team a list of all medications you use. They will tell you how much medication to take. Do not take more medication than directed. Call emergency services if you have problems breathing or staying awake. Children may be at higher risk for side effects.  Stop giving this medication and call emergency services right away if your child has slow or noisy breathing, has confusion, is unusually sleepy, or not able to wake up. Long term use of this medication may cause your brain and body to depend on it. This can happen even when used as directed by your care team. You and your care team will work together to determine how long you will need to take this medication. If your care team wants you to stop this medication, the dose will be slowly lowered over time to reduce the risk of side effects. Naloxone is an emergency medication used for an opioid overdose. An overdose can happen if you take too much of an opioid. It can also happen if an opioid is taken with some other medications or substances such as alcohol. Know the symptoms of an overdose, such as trouble breathing, unusually tired or sleepy, or not being able to respond or wake up. Make sure to tell caregivers and close contacts where your naloxone is stored. Make sure they know how to use it. After naloxone is given, the person giving it must call emergency services. Naloxone is a temporary treatment. Repeat doses may be needed. This medication may affect your coordination, reaction time, or judgment. Do not drive or operate machinery until you know how this medication affects you. Sit up or stand slowly to reduce the risk of dizzy or fainting spells. Drinking alcohol with this medication can increase the risk of these side effects. Do not take other medications that contain acetaminophen with this medication. Many non-prescription medications contain acetaminophen. Always read  labels carefully. If you have questions, ask your care team. If you take too much acetaminophen, get medical help right away. Too much acetaminophen can be very dangerous and cause liver damage. Even if you do not have symptoms, it is important to get help right away. This medication will cause constipation. If you do not have a  bowel movement for 3 days, call your care team. Your mouth may get dry. Chewing sugarless gum or sucking hard candy and drinking plenty of water may help. Contact your care team if the problem does not go away or is severe. Talk to your care team if you are pregnant or think you might be pregnant. This medication can cause serious birth defects if taken during pregnancy. Prolonged use of this medication during pregnancy can cause withdrawal in a newborn. Talk to your care team before breastfeeding. Changes to your treatment plan may be needed. If you breastfeed while taking this medication, seek medical care right away if you notice the child has slow or noisy breathing, is unusually sleepy or not able to wake up, or is limp. Long-term use of this medication may cause infertility. Talk to your care team if you are concerned about your fertility. What side effects may I notice from receiving this medication? Side effects that you should report to your care team as soon as possible: Allergic reactions--skin rash, itching, hives, swelling of the face, lips, tongue, or throat CNS depression--slow or shallow breathing, shortness of breath, feeling faint, dizziness, confusion, trouble staying awake Liver injury--right upper belly pain, loss of appetite, nausea, light-colored stool, dark yellow or brown urine, yellowing skin or eyes, unusual weakness or fatigue Low adrenal gland function--nausea, vomiting, loss of appetite, unusual weakness or fatigue, dizziness Low blood pressure--dizziness, feeling faint or lightheaded, blurry vision Redness, blistering, peeling, or loosening of the skin, including inside the mouth Side effects that usually do not require medical attention (report to your care team if they continue or are bothersome): Constipation Dizziness Drowsiness Dry mouth Headache Nausea Trouble sleeping Upset stomach Vomiting This list may not describe all possible side effects. Call your  doctor for medical advice about side effects. You may report side effects to FDA at 1-800-FDA-1088. Where should I keep my medication? Keep out of the reach of children and pets. This medication can be abused. Keep your medication in a safe place to protect it from theft. Do not share this medication with anyone. Selling or giving away this medication is dangerous and against the law. Store at room temperature between 15 and 30 degrees C (59 and 86 degrees F). This medication may cause harm and death if it is taken by other adults, children, or pets. It is important to get rid of the medication as soon as you no longer need it or it is expired. You can do this in two ways: Take the medication to a medication take-back program. Check with your pharmacy or law enforcement to find a location. If you cannot return the medication, flush it down the toilet. NOTE: This sheet is a summary. It may not cover all possible information. If you have questions about this medicine, talk to your doctor, pharmacist, or health care provider.  2023 Elsevier/Gold Standard (2003-12-10 00:00:00)   Rimegepant Disintegrating Tablets What is this medication? RIMEGEPANT (ri ME je pant) prevents and treats migraines. It works by blocking a substance in the body that causes migraines. This medicine may be used for other purposes; ask your health care provider or pharmacist  if you have questions. COMMON BRAND NAME(S): NURTEC ODT What should I tell my care team before I take this medication? They need to know if you have any of these conditions: Kidney disease Liver disease An unusual or allergic reaction to rimegepant, other medications, foods, dyes, or preservatives Pregnant or trying to get pregnant Breast-feeding How should I use this medication? Take this medication by mouth. Take it as directed on the prescription label. Leave the tablet in the sealed pack until you are ready to take it. With dry hands, open the  pack and gently remove the tablet. If the tablet breaks or crumbles, throw it away. Use a new tablet. Place the tablet in the mouth and allow it to dissolve. Then, swallow it. Do not cut, crush, or chew this medication. You do not need water to take this medication. Talk to your care team about the use of this medication in children. Special care may be needed. Overdosage: If you think you have taken too much of this medicine contact a poison control center or emergency room at once. NOTE: This medicine is only for you. Do not share this medicine with others. What if I miss a dose? This does not apply. This medication is not for regular use. What may interact with this medication? Certain medications for fungal infections, such as fluconazole, itraconazole Rifampin This list may not describe all possible interactions. Give your health care provider a list of all the medicines, herbs, non-prescription drugs, or dietary supplements you use. Also tell them if you smoke, drink alcohol, or use illegal drugs. Some items may interact with your medicine. What should I watch for while using this medication? Visit your care team for regular checks on your progress. Tell your care team if your symptoms do not start to get better or if they get worse. What side effects may I notice from receiving this medication? Side effects that you should report to your care team as soon as possible: Allergic reactions--skin rash, itching, hives, swelling of the face, lips, tongue, or throat Side effects that usually do not require medical attention (report to your care team if they continue or are bothersome): Nausea Stomach pain This list may not describe all possible side effects. Call your doctor for medical advice about side effects. You may report side effects to FDA at 1-800-FDA-1088. Where should I keep my medication? Keep out of the reach of children and pets. Store at room temperature between 20 and 25 degrees  C (68 and 77 degrees F). Get rid of any unused medication after the expiration date. To get rid of medications that are no longer needed or have expired: Take the medication to a medication take-back program. Check with your pharmacy or law enforcement to find a location. If you cannot return the medication, check the label or package insert to see if the medication should be thrown out in the garbage or flushed down the toilet. If you are not sure, ask your care team. If it is safe to put it in the trash, take the medication out of the container. Mix the medication with cat litter, dirt, coffee grounds, or other unwanted substance. Seal the mixture in a bag or container. Put it in the trash. NOTE: This sheet is a summary. It may not cover all possible information. If you have questions about this medicine, talk to your doctor, pharmacist, or health care provider.  2023 Elsevier/Gold Standard (2021-01-15 00:00:00)

## 2021-12-23 NOTE — Progress Notes (Signed)
[facility]    Provider:  Dr Jaynee Eagles Requesting Provider: Renold Genta Cresenciano Lick, MD Primary Care Provider:  Elby Showers, MD  CC:  Migraines  HPI:  Dana Washington is a 46 y.o. female here as requested by Elby Showers, MD for migraines. Has had them for about 20 years. She saw a neurologist in [place] over 11 years ago. A few days before the migraine blurry vision, feel like you need reading glasses, go on a few days and then get a migraine. Her whole entire head is ready to explode, irritability, everything bothers her, nausea, vomiting, photophobia, pulsating/pounding/throbbing, she usually so severe she cries. In thepast she used Vicodin and sleep it off. She gets 1-2 migraines a year. She tried fioricet and it never worked well she got 30 pills in Jan 2021 and lasted her 3 years. Feels like migraines are actually becoming less frequent. No new symptoms. Usually weather changes. Now with hormones may be helping her with pre-menopause. Oxycodone does not work. No other focal neurologic deficits, associated symptoms, inciting events or modifiable factors.  Reviewed notes, labs and imaging from outside physicians, which showed:     Latest Ref Rng & Units 11/03/2021    9:17 AM 05/04/2021    7:42 AM 02/08/2021   12:44 PM  CBC  WBC 3.8 - 10.8 Thousand/uL 6.3  6.6  8.0   Hemoglobin 11.7 - 15.5 g/dL 16.2  15.6  15.5   Hematocrit 35.0 - 45.0 % 47.9  47.5  45.4   Platelets 140 - 400 Thousand/uL 395  325  416        Latest Ref Rng & Units 11/03/2021    9:17 AM 05/04/2021    7:42 AM 02/08/2021   12:44 PM  CMP  Glucose 65 - 99 mg/dL 95  94  113   BUN 7 - 25 mg/dL '13  17  12   '$ Creatinine 0.50 - 0.99 mg/dL 0.88  0.98  0.90   Sodium 135 - 146 mmol/L 140  141  136   Potassium 3.5 - 5.3 mmol/L 4.8  4.9  2.8   Chloride 98 - 110 mmol/L 104  104  98   CO2 20 - 32 mmol/L '25  24  25   '$ Calcium 8.6 - 10.2 mg/dL 9.8  9.2  9.3   Total Protein 6.1 - 8.1 g/dL 7.6  7.0  7.6   Total  Bilirubin 0.2 - 1.2 mg/dL 0.3  0.3  0.4   Alkaline Phos 44 - 121 IU/L  82  54   AST 10 - 35 U/L '17  18  23   '$ ALT 6 - 29 U/L '16  15  19      '$ Review of Systems: Patient complains of symptoms per HPI as well as the following symptoms stable migraines. Pertinent negatives and positives per HPI. All others negative.   Social History   Socioeconomic History   Marital status: Single    Spouse name: n/a   Number of children: 0   Years of education: Not on file   Highest education level: Not on file  Occupational History   Occupation: Pediatric ECHO sonographer    Employer: [employer]  Tobacco Use   Smoking status: Never   Smokeless tobacco: Never  Vaping Use   Vaping Use: Never used  Substance and Sexual Activity   Alcohol use: Yes    Comment: social   Drug use: No   Sexual activity: Not Currently    Birth  control/protection: Pill  Other Topics Concern   Not on file  Social History Narrative   Lived alone until her mother moved in (retired and moved from [place]).   Her sister and father also live locally.   Caffeine: 2 cups coffee/day   Social Determinants of Health   Financial Resource Strain: Not on file  Food Insecurity: Not on file  Transportation Needs: Not on file  Physical Activity: Not on file  Stress: Not on file  Social Connections: Not on file  Intimate Partner Violence: Not on file    Family History  Problem Relation Age of Onset   High Cholesterol Mother    Obesity Mother    Thyroid disease Mother    High Cholesterol Father    Cancer Father    Obesity Father    Migraines Sister    Heart disease Maternal Grandfather    Colon cancer Maternal Grandfather        ds in his late 24's   Heart disease Paternal Grandmother    Esophageal cancer Other        mat great aunt, non-smoker   Rectal cancer Neg Hx    Stomach cancer Neg Hx    Colon polyps Neg Hx     Past Medical History:  Diagnosis Date   Allergy    Anxiety    Colon polyps     Constipation    Diabetes mellitus without complication ([place])    Fundic gland polyps of stomach, benign    GERD (gastroesophageal reflux disease)    Glucose intolerance (impaired glucose tolerance)    Herpes dermatitis    History of motion sickness    Hx of Hashimoto thyroiditis    Hx of sessile serrated colonic polyps 03/04/2021   Hyperlipidemia    Hypertension    Hypothyroidism, secondary    Impingement syndrome of right shoulder    Insomnia    Metabolic syndrome    Migraine    OA (osteoarthritis)    RIGHT SHOULDER AC JOINT   Obese    OCD (obsessive compulsive disorder)    PONV (postoperative nausea and vomiting)    SEVERE after breast reduction   TMJ (dislocation of temporomandibular joint)    Vitamin D deficiency    Vitamin D deficiency     Patient Active Problem List   Diagnosis Date Noted   Migraine without aura and without status migrainosus, not intractable 12/23/2021   Other hyperlipidemia 06/01/2021   Essential hypertension 06/01/2021   Hx of sessile serrated colonic polyps 03/04/2021   Prediabetes 08/28/2020   S/P left knee arthroscopy 10/25/2017   Knee pain 10/03/2017   Hashimoto's thyroiditis 08/05/2017   Vitamin D deficiency 04/11/2015   OCD (obsessive compulsive disorder) 07/09/2013   Anxiety state, unspecified 07/09/2013   Hypertension 07/10/2011   Depression 06/11/2011   History of migraine headaches 04/08/2011   Hyperlipidemia 01/07/2011   Impaired glucose tolerance 01/07/2011   Obesity 01/07/2011   Hypothyroidism 01/07/2011    Past Surgical History:  Procedure Laterality Date   biopsy of lymph node  FEB 2010   BENIGN   BREAST BIOPSY Bilateral    BREAST EXCISIONAL BIOPSY Left    benign   BREAST REDUCTION SURGERY Bilateral MAY 2010   CARPAL TUNNEL RELEASE Bilateral 2012   CHONDROPLASTY Left 10/25/2017   Procedure: CHONDROPLASTY;  Surgeon: Sydnee Cabal, MD;  Location: [facility];  Service: Orthopedics;  Laterality: Left;    EXCISION MASS ABDOMINAL N/A 04/14/2017   Procedure: EXCISION OF SUBCUTANEOUS LIPOMA OF  LEFT UPPER ABDOMINAL WALL;  Surgeon: Donnie Mesa, MD;  Location: [facility];  Service: General;  Laterality: N/A;   KNEE ARTHROSCOPY WITH MEDIAL MENISECTOMY Left 10/25/2017   Procedure: LEFT KNEE ARTHROSCOPY WITH MEDIAL AND LATERAL MENISECTOMY;  Surgeon: Sydnee Cabal, MD;  Location: [facility];  Service: Orthopedics;  Laterality: Left;   LASIK  2008   REDUCTION MAMMAPLASTY Bilateral    SHOULDER ARTHROSCOPY WITH ROTATOR CUFF REPAIR AND SUBACROMIAL DECOMPRESSION Right 09/19/2012   Procedure: RIGHT SHOULDER EXAMINE UNDER ANESTHESIA, ARTHROSCOPY,  DEBRIDEMENT, SUBACROMIAL DECOMPRESSION, DISTAL CLAVICLE RESECTION AND  ROTATOR CUFF REPAIR, LABRAL REPAIR ;  Surgeon: Sydnee Cabal, MD;  Location: [facility];  Service: Orthopedics;  Laterality: Right;   WISDOM TOOTH EXTRACTION  AGE 13    Current Outpatient Medications  Medication Sig Dispense Refill   ALPRAZolam (XANAX) 0.5 MG tablet Take 1 tablet (0.5 mg total) by mouth 2 (two) times daily as needed for anxiety. 60 tablet 1   benazepril-hydrochlorthiazide (LOTENSIN HCT) 20-12.5 MG tablet Take 1 tablet by mouth daily. 90 tablet 3   FLUoxetine (PROZAC) 40 MG capsule Take 1 capsule by mouth daily. 90 capsule 3   HYDROcodone-acetaminophen (NORCO/VICODIN) 5-325 MG tablet Take 1 tablet by mouth every 6 (six) hours as needed for moderate pain. 30 tablet 0   norethindrone-ethinyl estradiol-iron (BLISOVI FE 1.5/30) 1.5-30 MG-MCG tablet TAKE 1 TABLET BY MOUTH ONCE DAILY 84 tablet 4   omeprazole (PRILOSEC) 20 MG capsule TAKE 1 CAPSULE BY MOUTH ONCE DAILY 90 capsule 3   ondansetron (ZOFRAN) 4 MG tablet Take 1 tablet (4 mg total) by mouth every 8 (eight) hours as needed for nausea or vomiting. 20 tablet 1   Rimegepant Sulfate (NURTEC) 75 MG TBDP Take 75 mg by mouth daily as needed. For migraines. Take as close to onset of migraine  as possible. One daily maximum. 6 tablet 0   rosuvastatin (CRESTOR) 20 MG tablet Take 1 tablet by mouth daily. 90 tablet 3   SYNTHROID 100 MCG tablet TAKE 1 TABLET BY MOUTH ONCE DAILY 90 tablet 1   Tirzepatide-Weight Management (ZEPBOUND) 2.5 MG/0.5ML SOAJ Inject 2.5 mg into the skin once a week. 2 mL 0   topiramate (TOPAMAX) 100 MG tablet Take 1 tablet (100 mg total) by mouth at bedtime. 30 tablet 0   No current facility-administered medications for this visit.    Allergies as of 12/23/2021 - Review Complete 12/23/2021  Allergen Reaction Noted   Bee venom Hives 08/17/2011    Vitals: BP 133/84 (BP Location: Right Arm, Patient Position: Sitting, Cuff Size: Large)   Pulse 97   Ht '5\' 6"'$  (1.676 m)   Wt 249 lb (112.9 kg)   LMP 11/26/2021   BMI 40.19 kg/m  Last Weight:  Wt Readings from Last 1 Encounters:  12/23/21 249 lb (112.9 kg)   Last Height:   Ht Readings from Last 1 Encounters:  12/23/21 '5\' 6"'$  (1.676 m)     Physical exam: Exam: Gen: NAD, conversant, well nourised, obese, well groomed                     CV: RRR, no MRG. No Carotid Bruits. No peripheral edema, warm, nontender Eyes: Conjunctivae clear without exudates or hemorrhage  Neuro: Detailed Neurologic Exam  Speech:    Speech is normal; fluent and spontaneous with normal comprehension.  Cognition:    The patient is oriented to person, place, and time;     recent and remote memory intact;  language fluent;     normal attention, concentration,     fund of knowledge Cranial Nerves:    The pupils are equal, round, and reactive to light. The fundi are normal and spontaneous venous pulsations are present. Visual fields are full to finger confrontation. Extraocular movements are intact. Trigeminal sensation is intact and the muscles of mastication are normal. The face is symmetric. The palate elevates in the midline. Hearing intact. Voice is normal. Shoulder shrug is normal. The tongue has normal motion without  fasciculations.   Coordination: nml  Gait: nml  Motor Observation:    No asymmetry, no atrophy, and no involuntary movements noted. Tone:    Normal muscle tone.    Posture:    Posture is normal. normal erect    Strength:    Strength is V/V in the upper and lower limbs.      Sensation: intact to LT     Reflex Exam:  DTR's:    Deep tendon reflexes in the upper and lower extremities are normal bilaterally.   Toes:    The toes are downgoing bilaterally.   Clonus:    Clonus is absent.    Assessment/Plan:  46 year old with stable episodic occasional migraines (2 a year). We discussed options for acute management.Neuro exam normal, no need for imaging at this time.  It sounds more like you have a prodrome as opposed to an aura, but discussed risk of stroke in patients with aura At your prodrome start taking nurtec daily(gave samples) and if you like we can prescribe for you OR take nurtec at onset of migraine or during migraine May take nurtec with ondansetron And if Vicodin in the past has helped, you are comfortable with it, don't abuse it and use it 1-2x a year I will provide that limited Nurtec once daily as needed (if she likes it we can provide) Discussed new class of medications F/u one year  Meds ordered this encounter  Medications   Rimegepant Sulfate (NURTEC) 75 MG TBDP    Sig: Take 75 mg by mouth daily as needed. For migraines. Take as close to onset of migraine as possible. One daily maximum.    Dispense:  6 tablet    Refill:  0   HYDROcodone-acetaminophen (NORCO/VICODIN) 5-325 MG tablet    Sig: Take 1 tablet by mouth every 6 (six) hours as needed for moderate pain.    Dispense:  30 tablet    Refill:  0    Cc: Baxley, Cresenciano Lick, MD,  Baxley, Cresenciano Lick, MD  Sarina Ill, MD  [facility] [address] [place], [place] [address]  Phone [phone] Fax [phone]  I spent over 45 minutes of face-to-face and  non-face-to-face time with patient on the  1. Migraine without aura and without status migrainosus, not intractable    diagnosis.  This included previsit chart review, lab review, study review, order entry, electronic health record documentation, patient education on the different diagnostic and therapeutic options, counseling and coordination of care, risks and benefits of management, compliance, or risk factor reduction

## 2021-12-24 ENCOUNTER — Other Ambulatory Visit: Payer: Self-pay

## 2021-12-24 ENCOUNTER — Other Ambulatory Visit (HOSPITAL_COMMUNITY): Payer: Self-pay

## 2021-12-31 ENCOUNTER — Telehealth (INDEPENDENT_AMBULATORY_CARE_PROVIDER_SITE_OTHER): Payer: Self-pay

## 2021-12-31 ENCOUNTER — Other Ambulatory Visit (HOSPITAL_COMMUNITY): Payer: Self-pay

## 2021-12-31 NOTE — Telephone Encounter (Signed)
Prior auth received from covermymeds for  zepbound 2.'5MG'$ /0.5ML pen-injectors.   Key: [id number] - PA Case ID: [id number] - Rx #: [id number].

## 2022-01-05 ENCOUNTER — Other Ambulatory Visit: Payer: Self-pay

## 2022-01-05 ENCOUNTER — Other Ambulatory Visit (HOSPITAL_COMMUNITY): Payer: Self-pay

## 2022-01-06 NOTE — Progress Notes (Signed)
Chief Complaint:   OBESITY Dana Washington is here to discuss her progress with her obesity treatment plan along with follow-up of her obesity related diagnoses. Dana Washington is on the Category 3 Plan or keeping a food journal and adhering to recommended goals of 1500 calories and 85+ grams of protein and states she is following her eating plan approximately 45% of the time. Dana Washington states she is using the stepper elliptical for  20-30 minutes 5 times per week.  Today's visit was #: 20 Starting weight: 273 lbs Starting date: 08/27/2020 Today's weight: 245 lbs Today's date: 12/23/2021 Total lbs lost to date: 28 Total lbs lost since last in-office visit: 0  Interim History: Dana Washington has gotten off track with her weight loss efforts. She is still exercising but she is gaining weight. She has not started Dana Washington due to pharmacy shortages.   Subjective:   1. Other depression, emotional eating behaviors Dana Washington is doing well on Topamax but she is struggling with increased temptations.   Assessment/Plan:   1. Other depression, emotional eating behaviors Dana Washington will continue Topamax 100 mg qhs, and we will refill for 1 month.   - topiramate (TOPAMAX) 100 MG tablet; Take 1 tablet (100 mg total) by mouth at bedtime.  Dispense: 30 tablet; Refill: 0  2. Obesity, Current BMI 39.6 Dana Washington is currently in the action stage of change. As such, her goal is to continue with weight loss efforts. She has agreed to the Category 3 Plan or keeping a food journal and adhering to recommended goals of 1500 calories and 85+ grams of protein daily.   We discussed various medication options to help Dana Washington with her weight loss efforts and we both agreed to start Zepbound 2.5 mg once weekly with no refills; and she will discontinue Wegovy.  - tirzepatide (ZEPBOUND) 2.5 MG/0.5ML Pen; Inject 2.5 mg into the skin once a week.  Dispense: 2 mL; Refill: 0  Exercise goals: As is.   Behavioral modification strategies:  increasing lean protein intake.  Dana Washington has agreed to follow-up with our clinic in 5 weeks. She was informed of the importance of frequent follow-up visits to maximize her success with intensive lifestyle modifications for her multiple health conditions.   Objective:   Blood pressure 121/79, pulse 72, temperature 98.1 F (36.7 C), height '5\' 6"'$  (1.676 m), weight 245 lb (111.1 kg), last menstrual period 11/26/2021, SpO2 99 %. Body mass index is 39.54 kg/m.  General: Cooperative, alert, well developed, in no acute distress. HEENT: Conjunctivae and lids unremarkable. Cardiovascular: Regular rhythm.  Lungs: Normal work of breathing. Neurologic: No focal deficits.   Lab Results  Component Value Date   CREATININE 0.88 11/03/2021   BUN 13 11/03/2021   NA 140 11/03/2021   K 4.8 11/03/2021   CL 104 11/03/2021   CO2 25 11/03/2021   Lab Results  Component Value Date   ALT 16 11/03/2021   AST 17 11/03/2021   ALKPHOS 82 05/04/2021   BILITOT 0.3 11/03/2021   Lab Results  Component Value Date   HGBA1C 5.6 11/03/2021   HGBA1C 5.5 05/04/2021   HGBA1C 5.9 (H) 08/27/2020   HGBA1C 5.5 06/19/2019   HGBA1C 5.6 04/10/2018   Lab Results  Component Value Date   INSULIN 22.3 05/04/2021   INSULIN 28.0 (H) 08/27/2020   Lab Results  Component Value Date   TSH 0.72 11/03/2021   Lab Results  Component Value Date   CHOL 157 11/03/2021   HDL 63 11/03/2021   LDLCALC 77 11/03/2021  TRIG 91 11/03/2021   CHOLHDL 2.5 11/03/2021   Lab Results  Component Value Date   VD25OH 65.5 05/04/2021   VD25OH 45.7 08/27/2020   VD25OH 32 06/19/2019   Lab Results  Component Value Date   WBC 6.3 11/03/2021   HGB 16.2 (H) 11/03/2021   HCT 47.9 (H) 11/03/2021   MCV 91.8 11/03/2021   PLT 395 11/03/2021   No results found for: "IRON", "TIBC", "FERRITIN"  Attestation Statements:   Reviewed by clinician on day of visit: allergies, medications, problem list, medical history, surgical history, family  history, social history, and previous encounter notes.  Time spent on visit including pre-visit chart review and post-visit care and charting was 35 minutes.   I, Trixie Dredge, am acting as transcriptionist for Dennard Nip, MD.  I have reviewed the above documentation for accuracy and completeness, and I agree with the above. -  Dennard Nip, MD

## 2022-01-12 ENCOUNTER — Other Ambulatory Visit (HOSPITAL_COMMUNITY): Payer: Self-pay

## 2022-01-16 ENCOUNTER — Other Ambulatory Visit (HOSPITAL_COMMUNITY): Payer: Self-pay

## 2022-01-20 ENCOUNTER — Other Ambulatory Visit (HOSPITAL_COMMUNITY): Payer: Self-pay

## 2022-01-21 ENCOUNTER — Encounter: Payer: Self-pay | Admitting: Internal Medicine

## 2022-01-22 NOTE — Telephone Encounter (Signed)
Vaccines have been udated

## 2022-01-27 ENCOUNTER — Other Ambulatory Visit (HOSPITAL_COMMUNITY): Payer: Self-pay

## 2022-01-27 ENCOUNTER — Ambulatory Visit
Admission: RE | Admit: 2022-01-27 | Discharge: 2022-01-27 | Disposition: A | Discharge status: Home / Self Care | Payer: 59 | Source: Ambulatory Visit | Attending: Internal Medicine | Admitting: Internal Medicine

## 2022-01-27 DIAGNOSIS — Z1231 Encounter for screening mammogram for malignant neoplasm of breast: Secondary | ICD-10-CM | POA: Diagnosis not present

## 2022-01-28 ENCOUNTER — Ambulatory Visit (INDEPENDENT_AMBULATORY_CARE_PROVIDER_SITE_OTHER): Payer: 59 | Admitting: Family Medicine

## 2022-01-28 ENCOUNTER — Other Ambulatory Visit (HOSPITAL_COMMUNITY): Payer: Self-pay

## 2022-02-02 ENCOUNTER — Telehealth (INDEPENDENT_AMBULATORY_CARE_PROVIDER_SITE_OTHER): Payer: Self-pay | Admitting: *Deleted

## 2022-02-02 NOTE — Telephone Encounter (Signed)
Faxed over information to patients insurance for prior authorization for patients Zepbound. Waiting to hear back from insurance.

## 2022-02-03 ENCOUNTER — Other Ambulatory Visit (HOSPITAL_BASED_OUTPATIENT_CLINIC_OR_DEPARTMENT_OTHER): Payer: Self-pay

## 2022-02-03 ENCOUNTER — Ambulatory Visit (INDEPENDENT_AMBULATORY_CARE_PROVIDER_SITE_OTHER): Payer: 59 | Admitting: Family Medicine

## 2022-02-03 ENCOUNTER — Encounter (INDEPENDENT_AMBULATORY_CARE_PROVIDER_SITE_OTHER): Payer: Self-pay | Admitting: Family Medicine

## 2022-02-03 VITALS — BP 114/76 | HR 93 | Temp 98.4°F | Ht 66.0 in | Wt 249.0 lb

## 2022-02-03 DIAGNOSIS — Z6841 Body Mass Index (BMI) 40.0 and over, adult: Secondary | ICD-10-CM | POA: Diagnosis not present

## 2022-02-03 DIAGNOSIS — F418 Other specified anxiety disorders: Secondary | ICD-10-CM

## 2022-02-03 DIAGNOSIS — E669 Obesity, unspecified: Secondary | ICD-10-CM

## 2022-02-03 DIAGNOSIS — Z7985 Long-term (current) use of injectable non-insulin antidiabetic drugs: Secondary | ICD-10-CM | POA: Diagnosis not present

## 2022-02-03 DIAGNOSIS — E118 Type 2 diabetes mellitus with unspecified complications: Secondary | ICD-10-CM | POA: Diagnosis not present

## 2022-02-03 DIAGNOSIS — K5909 Other constipation: Secondary | ICD-10-CM | POA: Diagnosis not present

## 2022-02-03 MED ORDER — POLYETHYLENE GLYCOL 3350 17 GM/SCOOP PO POWD
17.0000 g | Freq: Two times a day (BID) | ORAL | 1 refills | Status: AC | PRN
  Filled 2022-02-03: qty 510, 15d supply, fill #0
  Filled 2022-02-03: qty 510, 30d supply, fill #0
  Filled 2022-04-05: qty 952, 28d supply, fill #1

## 2022-02-03 MED ORDER — ZEPBOUND 2.5 MG/0.5ML ~~LOC~~ SOAJ
2.5000 mg | SUBCUTANEOUS | 0 refills | Status: DC
  Filled 2022-02-03 – 2022-02-19 (×3): qty 2, 28d supply, fill #0

## 2022-02-03 MED ORDER — ESCITALOPRAM OXALATE 20 MG PO TABS
20.0000 mg | ORAL_TABLET | Freq: Every day | ORAL | 1 refills | Status: DC
  Filled 2022-02-03 (×2): qty 30, 30d supply, fill #0
  Filled 2022-02-18 – 2022-03-02 (×2): qty 30, 30d supply, fill #1

## 2022-02-04 ENCOUNTER — Other Ambulatory Visit (HOSPITAL_BASED_OUTPATIENT_CLINIC_OR_DEPARTMENT_OTHER): Payer: Self-pay

## 2022-02-16 ENCOUNTER — Other Ambulatory Visit: Payer: Self-pay

## 2022-02-17 NOTE — Progress Notes (Unsigned)
Chief Complaint:   OBESITY Dana Washington is here to discuss her progress with her obesity treatment plan along with follow-up of her obesity related diagnoses. Dana Washington is on the Category 3 Plan or keeping a food journal and adhering to recommended goals of 1500 calories and 85+ grams of protein daily and states she is following her eating plan approximately 85% of the time. Dana Washington states she is on the treadmill, elliptical, and bike for 20-60 minutes 5 times per week.  Today's visit was #: 21 Starting weight: 273 lbs Starting date: 08/27/2020 Today's weight: 249 lbs Today's date: 02/03/2022 Total lbs lost to date: 24 Total lbs lost since last in-office visit: 0  Interim History: Dana Washington is retaining some water weight.  She has been mindful of her eating and she tried to portion control.  She is getting ready to go on a medical mission trip and she is making eating strategies.  Subjective:   1. Other constipation Dana Washington is using MiraLAX and it is helping somewhat, but her water has decreased.  2. Controlled type 2 diabetes mellitus with complication, without long-term current use of insulin (Dana Washington) Dana Washington started Zepbound last week and she is doing well so far.  She denies nausea or diarrhea.  3. Depression with anxiety Dana Washington has been on Prozac for many years, but her stress is still high and anxiety has worsened.  She is crying at work more often.  Assessment/Plan:   1. Other constipation We will refill MiraLAX 17 g daily for 1 month.  Dana Washington will continue working on increasing her water intake.  - polyethylene glycol powder (GLYCOLAX/MIRALAX) 17 GM/SCOOP powder; Take 17 g by mouth 2 (two) times daily as needed.  Dispense: 3350 g; Refill: 1  2. Controlled type 2 diabetes mellitus with complication, without long-term current use of insulin ([facility]) We will refill Zepbound for 1 month.  Dana Washington will continue with her diet and exercise.  - tirzepatide (ZEPBOUND) 2.5 MG/0.5ML Pen;  Inject 2.5 mg into the skin once a week.  Dispense: 2 mL; Refill: 0  3. Depression with anxiety Dana Washington agreed to discontinue Prozac, and start Lexapro 20 mg every morning with 1 refill.  - escitalopram (LEXAPRO) 20 MG tablet; Take 1 tablet (20 mg total) by mouth daily.  Dispense: 30 tablet; Refill: 1  4. BMI 40.0-44.9, adult ([facility])  5. Obesity, Beginning BMI 45.43 We will refill Zepbound for 1 month.   - tirzepatide (ZEPBOUND) 2.5 MG/0.5ML Pen; Inject 2.5 mg into the skin once a week.  Dispense: 2 mL; Refill: 0  Dana Washington is currently in the action stage of change. As such, her goal is to continue with weight loss efforts. She has agreed to the Category 3 Plan.   Dana Washington was advised to take protein bars and beef jerky in case her protein is low.  We will follow-up after she gets back to restart a more formal eating plan.  Exercise goals: As is.   Behavioral modification strategies: increasing water intake and travel eating strategies.  Dana Washington has agreed to follow-up with our clinic in 4 weeks. She was informed of the importance of frequent follow-up visits to maximize her success with intensive lifestyle modifications for her multiple health conditions.   Objective:   Blood pressure 114/76, pulse 93, temperature 98.4 F (36.9 C), height 5' 6"$  (1.676 m), weight 249 lb (112.9 kg), SpO2 98 %. Body mass index is 40.19 kg/m.  General: Cooperative, alert, well developed, in no acute distress. HEENT: Conjunctivae and lids unremarkable.  Cardiovascular: Regular rhythm.  Lungs: Normal work of breathing. Neurologic: No focal deficits.   Lab Results  Component Value Date   CREATININE 0.88 11/03/2021   BUN 13 11/03/2021   NA 140 11/03/2021   K 4.8 11/03/2021   CL 104 11/03/2021   CO2 25 11/03/2021   Lab Results  Component Value Date   ALT 16 11/03/2021   AST 17 11/03/2021   ALKPHOS 82 05/04/2021   BILITOT 0.3 11/03/2021   Lab Results  Component Value Date   HGBA1C 5.6  11/03/2021   HGBA1C 5.5 05/04/2021   HGBA1C 5.9 (H) 08/27/2020   HGBA1C 5.5 06/19/2019   HGBA1C 5.6 04/10/2018   Lab Results  Component Value Date   INSULIN 22.3 05/04/2021   INSULIN 28.0 (H) 08/27/2020   Lab Results  Component Value Date   TSH 0.72 11/03/2021   Lab Results  Component Value Date   CHOL 157 11/03/2021   HDL 63 11/03/2021   LDLCALC 77 11/03/2021   TRIG 91 11/03/2021   CHOLHDL 2.5 11/03/2021   Lab Results  Component Value Date   VD25OH 65.5 05/04/2021   VD25OH 45.7 08/27/2020   VD25OH 32 06/19/2019   Lab Results  Component Value Date   WBC 6.3 11/03/2021   HGB 16.2 (H) 11/03/2021   HCT 47.9 (H) 11/03/2021   MCV 91.8 11/03/2021   PLT 395 11/03/2021   No results found for: "IRON", "TIBC", "FERRITIN"  Attestation Statements:   Reviewed by clinician on day of visit: allergies, medications, problem list, medical history, surgical history, family history, social history, and previous encounter notes.   I, Trixie Dredge, am acting as transcriptionist for Dennard Nip, MD.  I have reviewed the above documentation for accuracy and completeness, and I agree with the above. -  Dennard Nip, MD

## 2022-02-18 ENCOUNTER — Other Ambulatory Visit (HOSPITAL_BASED_OUTPATIENT_CLINIC_OR_DEPARTMENT_OTHER): Payer: Self-pay

## 2022-02-19 ENCOUNTER — Other Ambulatory Visit (HOSPITAL_BASED_OUTPATIENT_CLINIC_OR_DEPARTMENT_OTHER): Payer: Self-pay

## 2022-02-22 ENCOUNTER — Telehealth: Payer: Self-pay | Admitting: Internal Medicine

## 2022-02-22 NOTE — Telephone Encounter (Signed)
Pam Ohanesian [phone]  Pam called to say Sharyl is out of the [place], she is in [place] and she is breaking out from head to toe with red dots with white rings around them and she is itching. They think it is from the [place] pills she is taking called [facility] and the hospital there told her to call her PCP. I let her  that there was not much Dr Renold Genta could do, and that I did not think Dr Renold Genta prescribed that medication for her. She was going to call back when she had more information. She did say Nakecia has quit taking medication.

## 2022-02-23 NOTE — Telephone Encounter (Signed)
After talking with Dr Renold Genta she said all she could do from here is to advise stop taking medicine and take benadryl without being able to see the rash, and she should consult one of the doctors there, they have some really good doctors there.

## 2022-02-25 NOTE — Telephone Encounter (Signed)
Pam called back today and Elmer is coming home the rash is not much better, she will be home on the night of  03/02/2022, so she would like to come on 03/04/2022 to see you for the rash. Do you want me to schedule her to come in on that day?

## 2022-02-25 NOTE — Telephone Encounter (Signed)
scheduled

## 2022-02-26 NOTE — Progress Notes (Signed)
Patient Care Team: Elby Showers, MD as PCP - General (Internal Medicine)  Visit Date: 03/04/22  Subjective:    Patient ID: Dana Washington , Female   DOB: 02/27/1975, 47 y.o.    MRN: [id number]   47 y.o. Female presents today for a rash. Patient has a past medical history of diabetes mellitus, hyperlipidemia, hypertension, Hashimoto thyroiditis, herpes dermatitis, hypothyroidism, right shoulder impingement syndrome, insomnia, colon polyps, anxiety, migraines, osteoarthritis, obesity, OCD, dislocation of temporomandibular joint, Vitamin D deficiency.  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 Started getting red bumps on arms on 02/21/22. Thinks Malarone may have contributed or possibly bugs. Started getting sore throat and low-grade fever on 02/24/22, resolved now. Taking Doxycycline for malaria prevention. Denies fever, chills. Needs additional Doxycycline to finish course of malaria prophylaxis.   Past Medical History:  Diagnosis Date   Allergy    Anxiety    Colon polyps    Constipation    Diabetes mellitus without complication (HCC)    Fundic gland polyps of stomach, benign    GERD (gastroesophageal reflux disease)    Glucose intolerance (impaired glucose tolerance)    Herpes dermatitis    History of motion sickness    Hx of Hashimoto thyroiditis    Hx of sessile serrated colonic polyps 03/04/2021   Hyperlipidemia    Hypertension    Hypothyroidism, secondary    Impingement syndrome of right shoulder    Insomnia    Metabolic syndrome    Migraine    OA (osteoarthritis)    RIGHT SHOULDER AC JOINT   Obese    OCD (obsessive compulsive disorder)    PONV (postoperative nausea and vomiting)    SEVERE after breast reduction   TMJ (dislocation of temporomandibular joint)    Vitamin D deficiency    Vitamin D deficiency      Family History  Problem Relation Age of Onset   High Cholesterol Mother     Obesity Mother    Thyroid disease Mother    High Cholesterol Father    Cancer Father    Obesity Father    Migraines Sister    Heart disease Maternal Grandfather    Colon cancer Maternal Grandfather        ds in his late 19's   Heart disease Paternal Grandmother    Esophageal cancer Other        mat great aunt, non-smoker   Rectal cancer Neg Hx    Stomach cancer Neg Hx    Colon polyps Neg Hx     Social History   Social History Narrative   Lived alone until her mother moved in (retired and moved from [place]).   Her sister and father also live locally.   Caffeine: 2 cups coffee/day      Review of Systems  Constitutional:  Negative for chills, fever and malaise/fatigue.  HENT:  Negative for congestion.   Eyes:  Negative for blurred vision.  Respiratory:  Negative for cough and shortness of breath.   Cardiovascular:  Negative for chest pain, palpitations and leg swelling.  Gastrointestinal:  Negative for vomiting.  Musculoskeletal:  Negative for back pain.  Skin:  Positive for itching and rash (Small red bumps on arms).  Neurological:  Negative for loss of consciousness and headaches.        Objective:   Vitals: BP 108/64   Pulse 74   Temp 98.2 F (36.8 C) (Tympanic)   Ht  $'5\' 6"'i$  (1.676 m)   Wt 246 lb (111.6 kg)   LMP 02/24/2022   SpO2 99%   BMI 39.71 kg/m    Physical Exam Vitals and nursing note reviewed.  Constitutional:      General: She is not in acute distress.    Appearance: Normal appearance. She is not toxic-appearing.  HENT:     Head: Normocephalic and atraumatic.  Pulmonary:     Effort: Pulmonary effort is normal.  Skin:    General: Skin is warm and dry.  Neurological:     Mental Status: She is alert and oriented to person, place, and time. Mental status is at baseline.  Psychiatric:        Mood and Affect: Mood normal.        Behavior: Behavior normal.        Thought Content: Thought content normal.        Judgment: Judgment normal.      Has multiple resolving small itichy papules on extremities that to me are consistent with insect bites  Results:   Studies obtained and personally reviewed by me:   Labs:       Component Value Date/Time   NA 140 11/03/2021 0917   NA 141 05/04/2021 0742   K 4.8 11/03/2021 0917   CL 104 11/03/2021 0917   CO2 25 11/03/2021 0917   GLUCOSE 95 11/03/2021 0917   BUN 13 11/03/2021 0917   BUN 17 05/04/2021 0742   CREATININE 0.88 11/03/2021 0917   CALCIUM 9.8 11/03/2021 0917   PROT 7.6 11/03/2021 0917   PROT 7.0 05/04/2021 0742   ALBUMIN 4.4 05/04/2021 0742   AST 17 11/03/2021 0917   ALT 16 11/03/2021 0917   ALKPHOS 82 05/04/2021 0742   BILITOT 0.3 11/03/2021 0917   BILITOT 0.3 05/04/2021 0742   GFRNONAA >60 02/08/2021 1244   GFRNONAA 85 07/18/2020 0937   GFRAA 98 07/18/2020 0937     Lab Results  Component Value Date   WBC 6.3 11/03/2021   HGB 16.2 (H) 11/03/2021   HCT 47.9 (H) 11/03/2021   MCV 91.8 11/03/2021   PLT 395 11/03/2021    Lab Results  Component Value Date   CHOL 157 11/03/2021   HDL 63 11/03/2021   LDLCALC 77 11/03/2021   TRIG 91 11/03/2021   CHOLHDL 2.5 11/03/2021    Lab Results  Component Value Date   HGBA1C 5.6 11/03/2021     Lab Results  Component Value Date   TSH 0.72 11/03/2021      Assessment & Plan:   Malaria Concerns: Given enough Doxycycline 100 mg once daily to get through remainder of one month course for Malaria prevention after getting back from [place].  Insect bites: Use cortizone cream if needed for itching.    I,Alexander Ruley,acting as a Education administrator for Elby Showers, MD.,have documented all relevant documentation on the behalf of Elby Showers, MD,as directed by  Elby Showers, MD while in the presence of Elby Showers, MD.   I, Elby Showers, MD, have reviewed all documentation for this visit. The documentation on 03/10/22 for the exam, diagnosis, procedures, and orders are all accurate and complete.

## 2022-03-03 ENCOUNTER — Other Ambulatory Visit (HOSPITAL_COMMUNITY): Payer: Self-pay

## 2022-03-03 ENCOUNTER — Other Ambulatory Visit: Payer: Self-pay

## 2022-03-04 ENCOUNTER — Ambulatory Visit (INDEPENDENT_AMBULATORY_CARE_PROVIDER_SITE_OTHER): Payer: 59 | Admitting: Internal Medicine

## 2022-03-04 ENCOUNTER — Encounter: Payer: Self-pay | Admitting: Internal Medicine

## 2022-03-04 ENCOUNTER — Other Ambulatory Visit (HOSPITAL_BASED_OUTPATIENT_CLINIC_OR_DEPARTMENT_OTHER): Payer: Self-pay

## 2022-03-04 VITALS — BP 108/64 | HR 74 | Temp 98.2°F | Ht 66.0 in | Wt 246.0 lb

## 2022-03-04 DIAGNOSIS — Z2989 Encounter for other specified prophylactic measures: Secondary | ICD-10-CM

## 2022-03-04 DIAGNOSIS — W57XXXA Bitten or stung by nonvenomous insect and other nonvenomous arthropods, initial encounter: Secondary | ICD-10-CM

## 2022-03-04 MED ORDER — DOXYCYCLINE HYCLATE 100 MG PO TABS
100.0000 mg | ORAL_TABLET | Freq: Every day | ORAL | 0 refills | Status: DC
  Filled 2022-03-04: qty 24, 24d supply, fill #0

## 2022-03-04 NOTE — Patient Instructions (Signed)
Rx sent to complete course of Doxycycline for malaria prophylaxis. Insect bites on arms improving.

## 2022-03-10 ENCOUNTER — Encounter (INDEPENDENT_AMBULATORY_CARE_PROVIDER_SITE_OTHER): Payer: Self-pay | Admitting: Family Medicine

## 2022-03-10 ENCOUNTER — Other Ambulatory Visit (HOSPITAL_BASED_OUTPATIENT_CLINIC_OR_DEPARTMENT_OTHER): Payer: Self-pay

## 2022-03-10 ENCOUNTER — Ambulatory Visit (INDEPENDENT_AMBULATORY_CARE_PROVIDER_SITE_OTHER): Payer: 59 | Admitting: Family Medicine

## 2022-03-10 VITALS — BP 107/70 | HR 87 | Temp 98.4°F | Ht 66.0 in | Wt 242.0 lb

## 2022-03-10 DIAGNOSIS — F3289 Other specified depressive episodes: Secondary | ICD-10-CM | POA: Diagnosis not present

## 2022-03-10 DIAGNOSIS — Z6838 Body mass index (BMI) 38.0-38.9, adult: Secondary | ICD-10-CM | POA: Insufficient documentation

## 2022-03-10 DIAGNOSIS — R632 Polyphagia: Secondary | ICD-10-CM

## 2022-03-10 DIAGNOSIS — Z6839 Body mass index (BMI) 39.0-39.9, adult: Secondary | ICD-10-CM | POA: Diagnosis not present

## 2022-03-10 DIAGNOSIS — F32A Depression, unspecified: Secondary | ICD-10-CM | POA: Insufficient documentation

## 2022-03-10 DIAGNOSIS — E669 Obesity, unspecified: Secondary | ICD-10-CM | POA: Diagnosis not present

## 2022-03-10 MED ORDER — ZEPBOUND 5 MG/0.5ML ~~LOC~~ SOAJ
5.0000 mg | SUBCUTANEOUS | 0 refills | Status: DC
  Filled 2022-03-10: qty 6, 84d supply, fill #0

## 2022-03-10 MED ORDER — TOPIRAMATE 100 MG PO TABS
100.0000 mg | ORAL_TABLET | Freq: Every day | ORAL | 0 refills | Status: DC
  Filled 2022-03-10: qty 30, 30d supply, fill #0

## 2022-03-10 MED ORDER — ESCITALOPRAM OXALATE 20 MG PO TABS
20.0000 mg | ORAL_TABLET | Freq: Every day | ORAL | 0 refills | Status: DC
  Filled 2022-03-10 – 2022-03-31 (×2): qty 90, 90d supply, fill #0

## 2022-03-11 ENCOUNTER — Other Ambulatory Visit: Payer: Self-pay

## 2022-03-22 ENCOUNTER — Other Ambulatory Visit: Payer: Self-pay

## 2022-03-29 NOTE — Progress Notes (Signed)
Chief Complaint:   OBESITY Dana Washington is here to discuss her progress with her obesity treatment plan along with follow-up of her obesity related diagnoses. Dana Washington is on the Category 3 Plan and states she is following her eating plan approximately 40% of the time. Dana Washington states she is on the treadmill, elliptical, and bike for 30-45 minutes 5 times per week.  Today's visit was #: 22 Starting weight: 273 lbs Starting date: 08/27/2020 Today's weight: 242 lbs Today's date: 03/10/2022 Total lbs lost to date: 31 Total lbs lost since last in-office visit: 7  Interim History: Dana Washington has been traveling to [place] and she did well with her nutrition and exercise.  She had to supplement her protein but she was prepared for this and did well.  She was happy to eat healthy as she is back in her routine.  Subjective:   1. Polyphagia Dana Washington started Zepbound and she has had no side effects.  She has also had minimal improvement in polyphagia.  2. Emotional Eating Behavior Dana Washington started Lexapro and she feels her mood has been more stable.  She is doing well with decreasing emotional eating behavior.  Assessment/Plan:   1. Polyphagia Dana Washington agreed to increase Zepbound to 5 mg once weekly with a 90-day supply.  - tirzepatide (ZEPBOUND) 5 MG/0.5ML Pen; Inject 5 mg into the skin once a week.  Dispense: 6 mL; Refill: 0  2. Emotional Eating Behavior Dana Washington will continue her medications, and we will refill Lexapro for 90 days and refill Topamax for 1 month.  - escitalopram (LEXAPRO) 20 MG tablet; Take 1 tablet (20 mg total) by mouth daily.  Dispense: 90 tablet; Refill: 0 - topiramate (TOPAMAX) 100 MG tablet; Take 1 tablet (100 mg total) by mouth at bedtime.  Dispense: 30 tablet; Refill: 0  3. BMI 39.0-39.9,adult  4. Obesity, Beginning BMI 45.43 Dana Washington is currently in the action stage of change. As such, her goal is to continue with weight loss efforts. She has agreed to the Category 3  Plan.   Exercise goals: As is.   Behavioral modification strategies: increasing lean protein intake.  Dana Washington has agreed to follow-up with our clinic in 3 to 4 weeks. She was informed of the importance of frequent follow-up visits to maximize her success with intensive lifestyle modifications for her multiple health conditions.   Objective:   Blood pressure 107/70, pulse 87, temperature 98.4 F (36.9 C), height 5\' 6"  (1.676 m), weight 242 lb (109.8 kg), last menstrual period 02/24/2022, SpO2 98 %. Body mass index is 39.06 kg/m.  Lab Results  Component Value Date   CREATININE 0.88 11/03/2021   BUN 13 11/03/2021   NA 140 11/03/2021   K 4.8 11/03/2021   CL 104 11/03/2021   CO2 25 11/03/2021   Lab Results  Component Value Date   ALT 16 11/03/2021   AST 17 11/03/2021   ALKPHOS 82 05/04/2021   BILITOT 0.3 11/03/2021   Lab Results  Component Value Date   HGBA1C 5.6 11/03/2021   HGBA1C 5.5 05/04/2021   HGBA1C 5.9 (H) 08/27/2020   HGBA1C 5.5 06/19/2019   HGBA1C 5.6 04/10/2018   Lab Results  Component Value Date   INSULIN 22.3 05/04/2021   INSULIN 28.0 (H) 08/27/2020   Lab Results  Component Value Date   TSH 0.72 11/03/2021   Lab Results  Component Value Date   CHOL 157 11/03/2021   HDL 63 11/03/2021   LDLCALC 77 11/03/2021   TRIG 91 11/03/2021   CHOLHDL  2.5 11/03/2021   Lab Results  Component Value Date   VD25OH 65.5 05/04/2021   VD25OH 45.7 08/27/2020   VD25OH 32 06/19/2019   Lab Results  Component Value Date   WBC 6.3 11/03/2021   HGB 16.2 (H) 11/03/2021   HCT 47.9 (H) 11/03/2021   MCV 91.8 11/03/2021   PLT 395 11/03/2021   No results found for: "IRON", "TIBC", "FERRITIN"  Attestation Statements:   Reviewed by clinician on day of visit: allergies, medications, problem list, medical history, surgical history, family history, social history, and previous encounter notes.   I, Trixie Dredge, am acting as transcriptionist for Dennard Nip, MD.  I  have reviewed the above documentation for accuracy and completeness, and I agree with the above. -  Dennard Nip, MD

## 2022-03-31 ENCOUNTER — Other Ambulatory Visit: Payer: Self-pay | Admitting: Internal Medicine

## 2022-03-31 MED ORDER — OMEPRAZOLE 20 MG PO CPDR
20.0000 mg | DELAYED_RELEASE_CAPSULE | Freq: Every day | ORAL | 3 refills | Status: DC
  Filled 2022-03-31: qty 90, 90d supply, fill #0
  Filled 2022-06-28: qty 90, 90d supply, fill #1
  Filled 2022-09-23: qty 90, 90d supply, fill #2
  Filled 2022-12-22: qty 90, 90d supply, fill #3

## 2022-04-01 ENCOUNTER — Other Ambulatory Visit: Payer: Self-pay

## 2022-04-01 ENCOUNTER — Other Ambulatory Visit (HOSPITAL_BASED_OUTPATIENT_CLINIC_OR_DEPARTMENT_OTHER): Payer: Self-pay

## 2022-04-01 ENCOUNTER — Ambulatory Visit (INDEPENDENT_AMBULATORY_CARE_PROVIDER_SITE_OTHER): Payer: 59 | Admitting: Family Medicine

## 2022-04-01 ENCOUNTER — Encounter (INDEPENDENT_AMBULATORY_CARE_PROVIDER_SITE_OTHER): Payer: Self-pay | Admitting: Family Medicine

## 2022-04-01 VITALS — BP 114/75 | HR 97 | Temp 97.8°F | Ht 66.0 in | Wt 242.0 lb

## 2022-04-01 DIAGNOSIS — R632 Polyphagia: Secondary | ICD-10-CM | POA: Diagnosis not present

## 2022-04-01 DIAGNOSIS — Z6839 Body mass index (BMI) 39.0-39.9, adult: Secondary | ICD-10-CM | POA: Diagnosis not present

## 2022-04-01 DIAGNOSIS — F3289 Other specified depressive episodes: Secondary | ICD-10-CM

## 2022-04-01 DIAGNOSIS — E669 Obesity, unspecified: Secondary | ICD-10-CM

## 2022-04-01 MED ORDER — ZEPBOUND 7.5 MG/0.5ML ~~LOC~~ SOAJ
7.5000 mg | SUBCUTANEOUS | 0 refills | Status: DC
  Filled 2022-04-01 – 2022-06-05 (×4): qty 2, 28d supply, fill #0

## 2022-04-01 MED ORDER — ESCITALOPRAM OXALATE 20 MG PO TABS
20.0000 mg | ORAL_TABLET | Freq: Every day | ORAL | 0 refills | Status: DC
  Filled 2022-04-01 – 2022-06-28 (×3): qty 90, 90d supply, fill #0

## 2022-04-01 MED ORDER — TOPIRAMATE 100 MG PO TABS
100.0000 mg | ORAL_TABLET | Freq: Every day | ORAL | 0 refills | Status: DC
  Filled 2022-04-01 – 2022-04-05 (×2): qty 30, 30d supply, fill #0

## 2022-04-02 ENCOUNTER — Other Ambulatory Visit: Payer: Self-pay

## 2022-04-05 ENCOUNTER — Other Ambulatory Visit: Payer: Self-pay

## 2022-04-05 ENCOUNTER — Other Ambulatory Visit (HOSPITAL_COMMUNITY): Payer: Self-pay

## 2022-04-05 ENCOUNTER — Other Ambulatory Visit (HOSPITAL_BASED_OUTPATIENT_CLINIC_OR_DEPARTMENT_OTHER): Payer: Self-pay

## 2022-04-06 ENCOUNTER — Other Ambulatory Visit (HOSPITAL_BASED_OUTPATIENT_CLINIC_OR_DEPARTMENT_OTHER): Payer: Self-pay

## 2022-04-06 NOTE — Progress Notes (Signed)
Chief Complaint:   OBESITY Dana Washington is here to discuss her progress with her obesity treatment plan along with follow-up of her obesity related diagnoses. Dana Washington is on the Category 3 Plan and states she is following her eating plan approximately 80-85% of the time. Dana Washington states she is on the treadmill and recumbent bike for 20-30 minutes 5 times per week.  Today's visit was #: 23 Starting weight: 273 lbs Starting date: 08/27/2020 Today's weight: 242 lbs Today's date: 04/01/2022 Total lbs lost to date: 31 Total lbs lost since last in-office visit: 0  Interim History: Dana Washington is doing well with her diet and exercise. She is actually gaining muscle mass with fat loss which is excellent. Her hunger is starting to improve.   Subjective:   1. Polyphagia Dana Washington notes decreased polyphagia on the 5 mg dose of Zepbound. She denies nausea or vomiting. Although her polyphagia has decreased, she still struggles at times.   2. Emotional Eating Behavior Dana Washington's mood is stable on her medications and she feels more in control of her frustrations. No side effects were noted.   Assessment/Plan:   1. Polyphagia Dana Washington agreed to increase Zepbound to 7.5 mg once weekly, and we will refill for 1 month.   - tirzepatide (ZEPBOUND) 7.5 MG/0.5ML Pen; Inject 7.5 mg into the skin once a week.  Dispense: 2 mL; Refill: 0  2. Emotional Eating Behavior Dana Washington will continue her medications, and we will refill Topamax for 1 month and Lexapro for 90 days.   - topiramate (TOPAMAX) 100 MG tablet; Take 1 tablet (100 mg total) by mouth at bedtime.  Dispense: 30 tablet; Refill: 0 - escitalopram (LEXAPRO) 20 MG tablet; Take 1 tablet (20 mg total) by mouth daily.  Dispense: 90 tablet; Refill: 0  3. BMI 39.0-39.9,adult  4. Obesity, Beginning BMI 45.43 Dana Washington is currently in the action stage of change. As such, her goal is to continue with weight loss efforts. She has agreed to the Category 3 Plan.    Exercise goals: As is.   Behavioral modification strategies: no skipping meals.  Dana Washington has agreed to follow-up with our clinic in 4 weeks. She was informed of the importance of frequent follow-up visits to maximize her success with intensive lifestyle modifications for her multiple health conditions.   Objective:   Blood pressure 114/75, pulse 97, temperature 97.8 F (36.6 C), height 5\' 6"  (1.676 m), weight 242 lb (109.8 kg), last menstrual period 02/24/2022, SpO2 96 %. Body mass index is 39.06 kg/m.  Lab Results  Component Value Date   CREATININE 0.88 11/03/2021   BUN 13 11/03/2021   NA 140 11/03/2021   K 4.8 11/03/2021   CL 104 11/03/2021   CO2 25 11/03/2021   Lab Results  Component Value Date   ALT 16 11/03/2021   AST 17 11/03/2021   ALKPHOS 82 05/04/2021   BILITOT 0.3 11/03/2021   Lab Results  Component Value Date   HGBA1C 5.6 11/03/2021   HGBA1C 5.5 05/04/2021   HGBA1C 5.9 (H) 08/27/2020   HGBA1C 5.5 06/19/2019   HGBA1C 5.6 04/10/2018   Lab Results  Component Value Date   INSULIN 22.3 05/04/2021   INSULIN 28.0 (H) 08/27/2020   Lab Results  Component Value Date   TSH 0.72 11/03/2021   Lab Results  Component Value Date   CHOL 157 11/03/2021   HDL 63 11/03/2021   LDLCALC 77 11/03/2021   TRIG 91 11/03/2021   CHOLHDL 2.5 11/03/2021   Lab Results  Component  Value Date   VD25OH 65.5 05/04/2021   VD25OH 45.7 08/27/2020   VD25OH 32 06/19/2019   Lab Results  Component Value Date   WBC 6.3 11/03/2021   HGB 16.2 (H) 11/03/2021   HCT 47.9 (H) 11/03/2021   MCV 91.8 11/03/2021   PLT 395 11/03/2021   No results found for: "IRON", "TIBC", "FERRITIN"  Attestation Statements:   Reviewed by clinician on day of visit: allergies, medications, problem list, medical history, surgical history, family history, social history, and previous encounter notes.  I have personally spent 45 minutes total time today in preparation, patient care, and documentation for  this visit, including the following: review of clinical lab tests; review of medical tests/procedures/services.   I, Trixie Dredge, am acting as transcriptionist for Dennard Nip, MD.  I have reviewed the above documentation for accuracy and completeness, and I agree with the above. -  Dennard Nip, MD

## 2022-04-29 ENCOUNTER — Encounter (INDEPENDENT_AMBULATORY_CARE_PROVIDER_SITE_OTHER): Payer: Self-pay | Admitting: Family Medicine

## 2022-04-29 ENCOUNTER — Ambulatory Visit (INDEPENDENT_AMBULATORY_CARE_PROVIDER_SITE_OTHER): Payer: 59 | Admitting: Family Medicine

## 2022-04-29 VITALS — BP 103/70 | HR 91 | Temp 98.0°F | Ht 66.0 in | Wt 239.0 lb

## 2022-04-29 DIAGNOSIS — E669 Obesity, unspecified: Secondary | ICD-10-CM | POA: Diagnosis not present

## 2022-04-29 DIAGNOSIS — G43009 Migraine without aura, not intractable, without status migrainosus: Secondary | ICD-10-CM

## 2022-04-29 DIAGNOSIS — Z6838 Body mass index (BMI) 38.0-38.9, adult: Secondary | ICD-10-CM | POA: Diagnosis not present

## 2022-04-29 DIAGNOSIS — F439 Reaction to severe stress, unspecified: Secondary | ICD-10-CM | POA: Diagnosis not present

## 2022-05-04 NOTE — Progress Notes (Signed)
Chief Complaint:   OBESITY Dana Washington is here to discuss her progress with her obesity treatment plan along with follow-up of her obesity related diagnoses. Dana Washington is on the Category 3 Plan and states she is following her eating plan approximately 85-90% of the time. Dana Washington states she is on the recumbent bike and treadmill for 25-30 minutes 6-7 times per week.  Today's visit was #: 24 Starting weight: 273 lbs Starting date: 08/27/2020 Today's weight: 239 lbs Today's date: 04/29/2022 Total lbs lost to date: 34 Total lbs lost since last in-office visit: 3  Interim History: Dana Washington is doing well with her weight loss.  She is working on following her plan but she notes less support than she used to have at home.  Subjective:   1. Stress Emmalynne has had a lot of stress at work especially in the last few weeks.  She is working on avoiding stress and comfort eating.  Overall she is doing well.  2. Migraine without aura and without status migrainosus, not intractable Dana Washington is on Topamax and her migraines are greatly reduced.  Assessment/Plan:   1. Stress Dana Washington was offered support and encouragement, and we will continue to monitor emotional eating behavior.  2. Migraine without aura and without status migrainosus, not intractable Dana Washington will continue Topamax 100 mg once daily, and we will continue to monitor.  3. BMI 38.0-38.9,adult  4. Obesity, Beginning BMI 45.43 Dana Washington is currently in the action stage of change. As such, her goal is to continue with weight loss efforts. She has agreed to the Category 3 Plan.   Exercise goals: As is.   Behavioral modification strategies: increasing lean protein intake, no skipping meals, and dealing with family or coworker sabotage.  Dana Washington has agreed to follow-up with our clinic in 4 weeks. She was informed of the importance of frequent follow-up visits to maximize her success with intensive lifestyle modifications for her multiple  health conditions.   Objective:   Blood pressure 103/70, pulse 91, temperature 98 F (36.7 C), height  (1.676 m), weight 239 lb (108.4 kg), SpO2 97 %. Body mass index is 38.58 kg/m.  General: Cooperative, alert, well developed, in no acute distress. HEENT: Conjunctivae and lids unremarkable. Cardiovascular: Regular rhythm.  Lungs: Normal work of breathing. Neurologic: No focal deficits.   Lab Results  Component Value Date   CREATININE 0.88 11/03/2021   BUN 13 11/03/2021   NA 140 11/03/2021   K 4.8 11/03/2021   CL 104 11/03/2021   CO2 25 11/03/2021   Lab Results  Component Value Date   ALT 16 11/03/2021   AST 17 11/03/2021   ALKPHOS 82 05/04/2021   BILITOT 0.3 11/03/2021   Lab Results  Component Value Date   HGBA1C 5.6 11/03/2021   HGBA1C 5.5 05/04/2021   HGBA1C 5.9 (H) 08/27/2020   HGBA1C 5.5 06/19/2019   HGBA1C 5.6 04/10/2018   Lab Results  Component Value Date   INSULIN 22.3 05/04/2021   INSULIN 28.0 (H) 08/27/2020   Lab Results  Component Value Date   TSH 0.72 11/03/2021   Lab Results  Component Value Date   CHOL 157 11/03/2021   HDL 63 11/03/2021   LDLCALC 77 11/03/2021   TRIG 91 11/03/2021   CHOLHDL 2.5 11/03/2021   Lab Results  Component Value Date   VD25OH 65.5 05/04/2021   VD25OH 45.7 08/27/2020   VD25OH 32 06/19/2019   Lab Results  Component Value Date   WBC 6.3 11/03/2021   HGB 16.2 (  H) 11/03/2021   HCT 47.9 (H) 11/03/2021   MCV 91.8 11/03/2021   PLT 395 11/03/2021   No results found for: "IRON", "TIBC", "FERRITIN"  Attestation Statements:   Reviewed by clinician on day of visit: allergies, medications, problem list, medical history, surgical history, family history, social history, and previous encounter notes.  Time spent on visit including pre-visit chart review and post-visit care and charting was 30 minutes.   I, Burt Knack, am acting as transcriptionist for Quillian Quince, MD.  I have reviewed the above  documentation for accuracy and completeness, and I agree with the above. -  Quillian Quince, MD

## 2022-05-12 ENCOUNTER — Other Ambulatory Visit: Payer: Self-pay

## 2022-05-12 ENCOUNTER — Encounter: Payer: Self-pay | Admitting: Internal Medicine

## 2022-05-12 ENCOUNTER — Other Ambulatory Visit (HOSPITAL_COMMUNITY): Payer: Self-pay

## 2022-05-12 DIAGNOSIS — F3289 Other specified depressive episodes: Secondary | ICD-10-CM

## 2022-05-12 MED ORDER — TOPIRAMATE 100 MG PO TABS
100.0000 mg | ORAL_TABLET | Freq: Every day | ORAL | 2 refills | Status: DC
  Filled 2022-05-12: qty 90, 90d supply, fill #0
  Filled 2022-08-05: qty 90, 90d supply, fill #1
  Filled 2022-11-03: qty 90, 90d supply, fill #2

## 2022-05-12 NOTE — Telephone Encounter (Signed)
Rx sent to the pharmacy.

## 2022-05-20 ENCOUNTER — Ambulatory Visit (INDEPENDENT_AMBULATORY_CARE_PROVIDER_SITE_OTHER): Payer: 59 | Admitting: Family Medicine

## 2022-05-21 IMAGING — DX DG ABDOMEN 1V
1 series · 3 of 3 positions shown · non-contrast
Comparison: 06/16/2018

CLINICAL DATA: Constipation, nausea, unspecified abdominal pain

EXAM:
ABDOMEN - 1 VIEW

[Series 1: abdomen · 0.14mm/px · 3 of 3 slices shown]
[im 1/3]
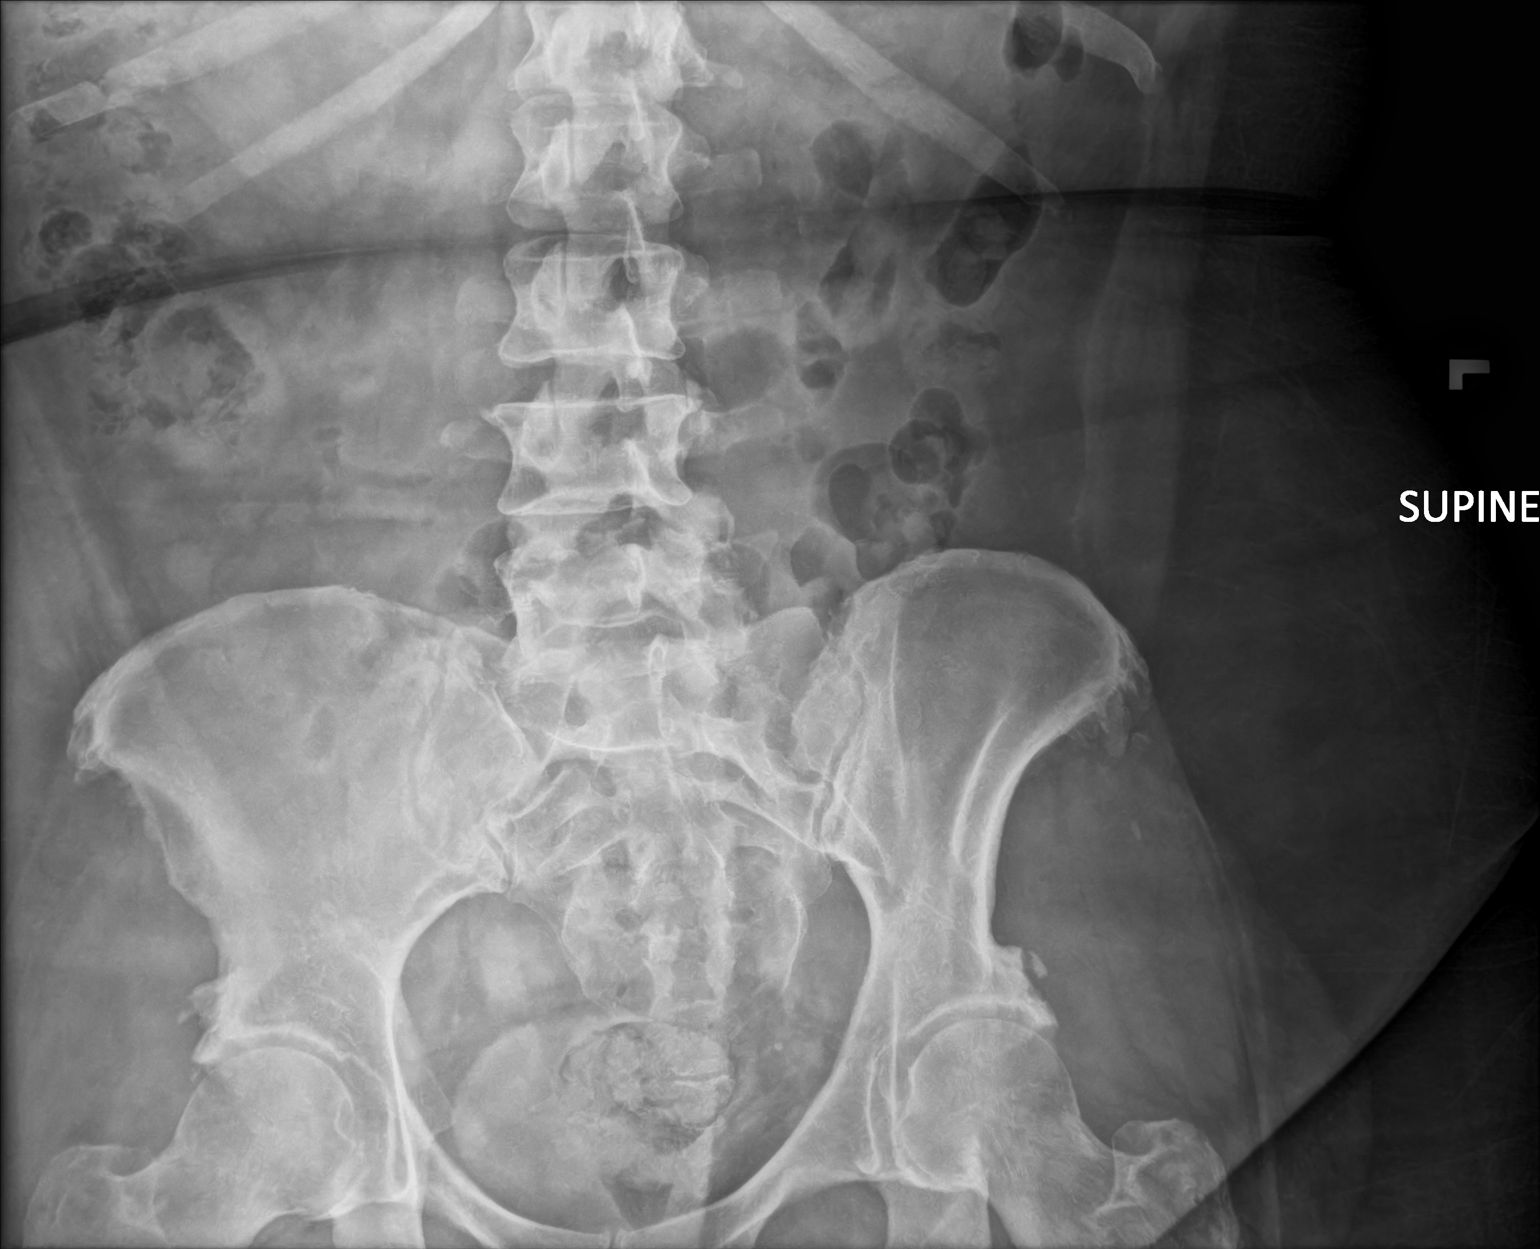
[im 2/3]
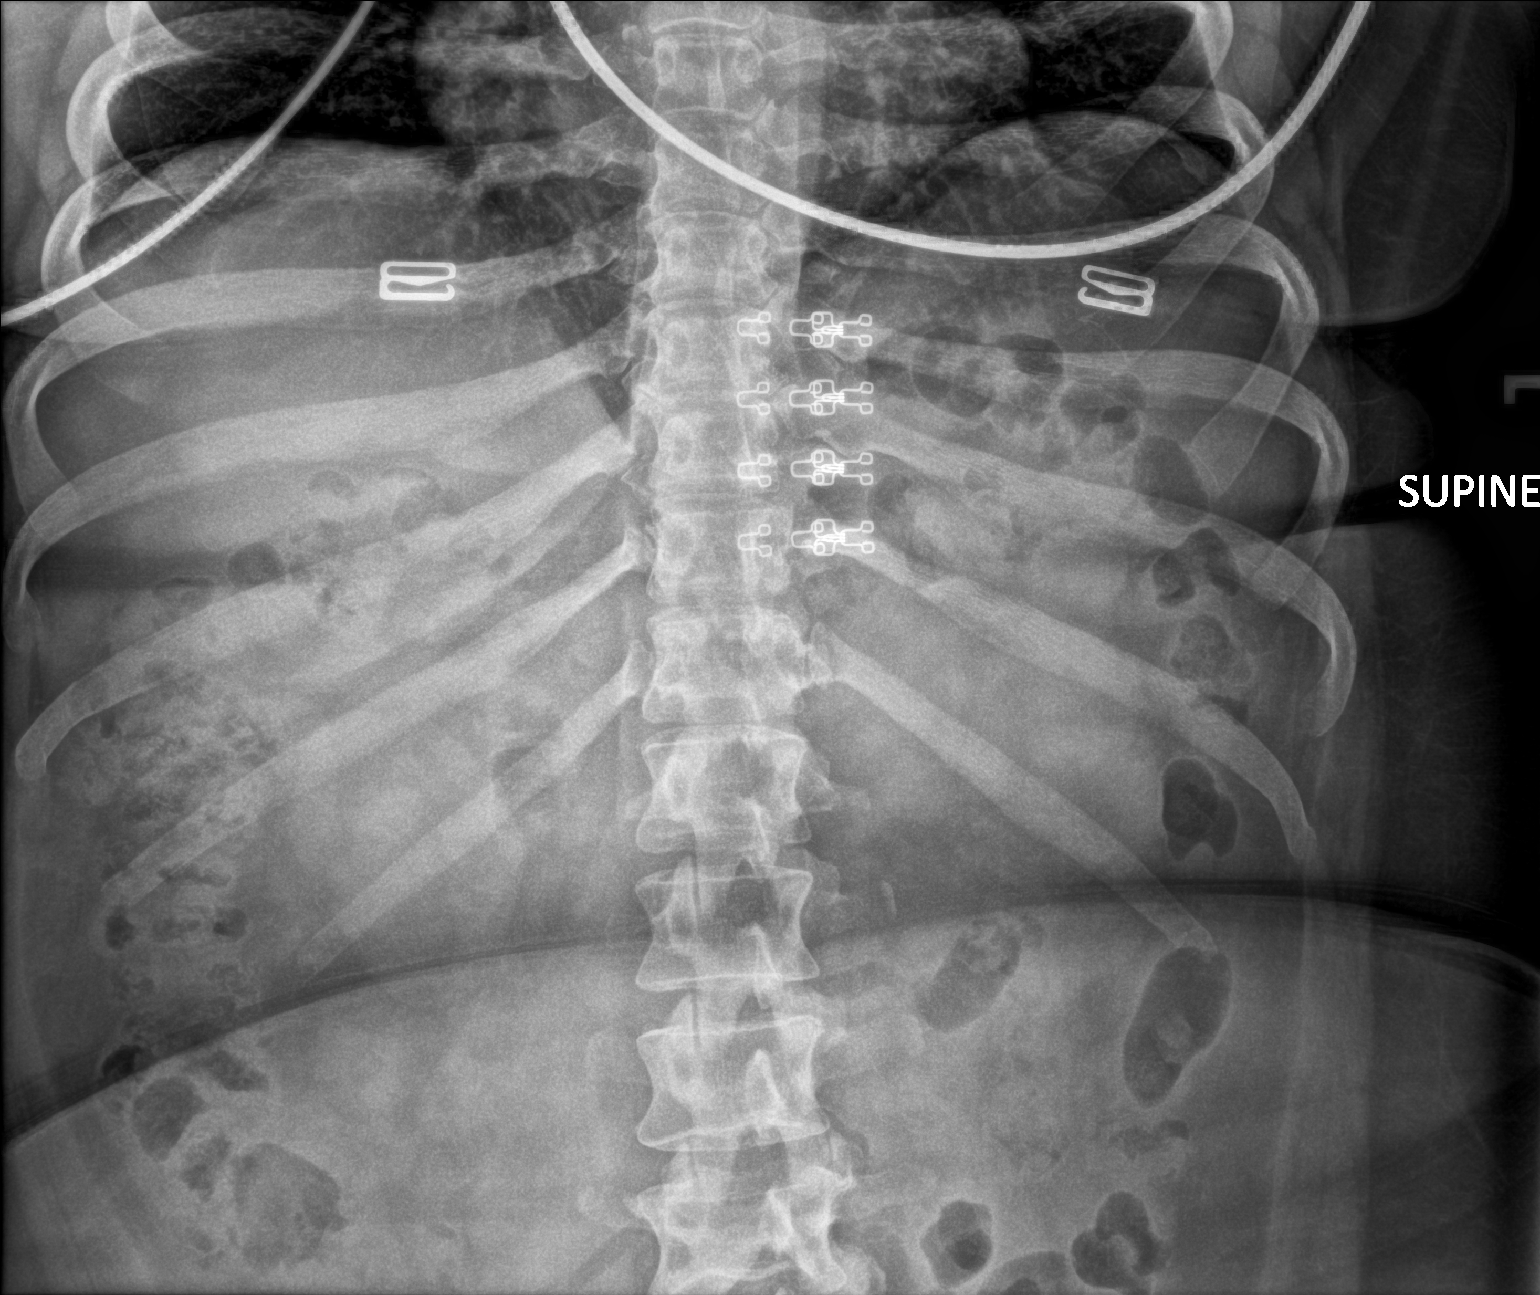
[im 3/3]
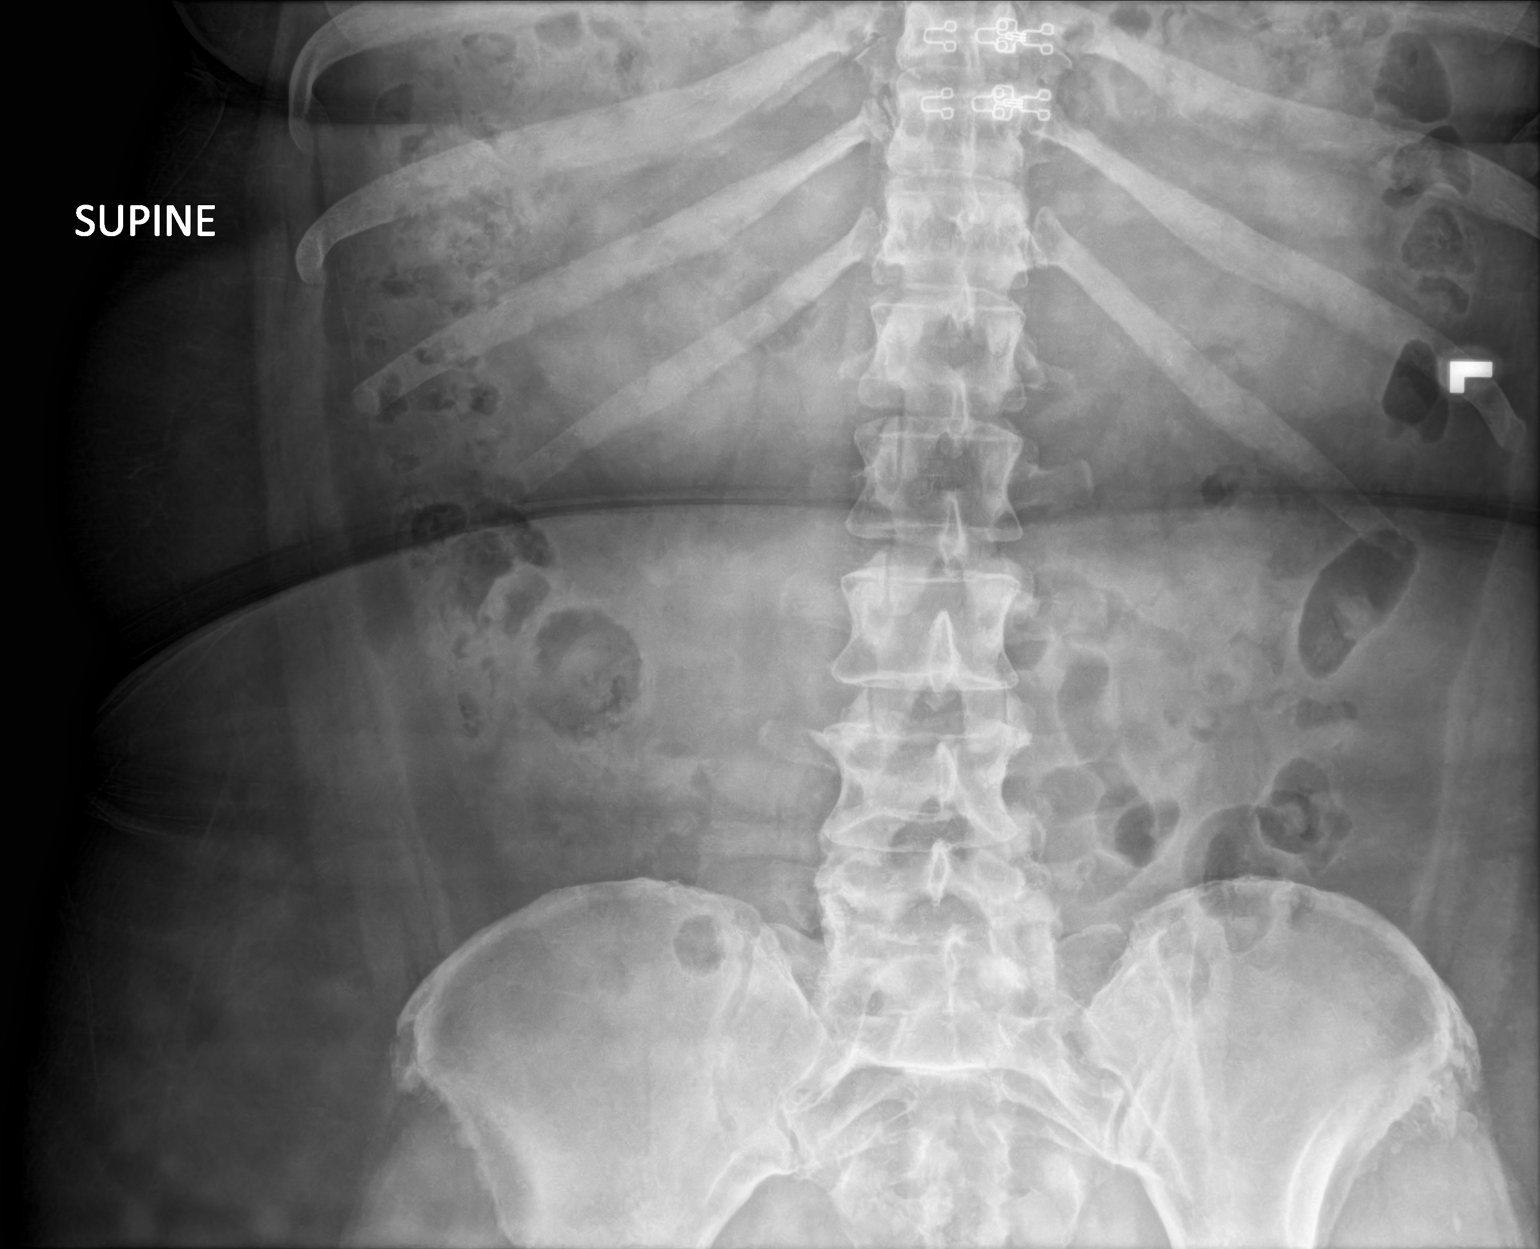

[3 of 3 positions shown; findings below may reference images not displayed]

FINDINGS: The bowel gas pattern is normal. No radio-opaque calculi or other
significant radiographic abnormality are seen.
IMPRESSION: Negative.

## 2022-05-24 ENCOUNTER — Telehealth: Payer: Self-pay | Admitting: Neurology

## 2022-05-24 NOTE — Telephone Encounter (Signed)
Sent mychart msg informing pt of appt change due to provider schedule change 

## 2022-06-04 ENCOUNTER — Other Ambulatory Visit (HOSPITAL_COMMUNITY): Payer: Self-pay

## 2022-06-04 ENCOUNTER — Encounter (HOSPITAL_BASED_OUTPATIENT_CLINIC_OR_DEPARTMENT_OTHER): Payer: Self-pay

## 2022-06-04 ENCOUNTER — Other Ambulatory Visit (HOSPITAL_BASED_OUTPATIENT_CLINIC_OR_DEPARTMENT_OTHER): Payer: Self-pay

## 2022-06-04 ENCOUNTER — Other Ambulatory Visit: Payer: Self-pay | Admitting: Internal Medicine

## 2022-06-04 MED ORDER — SYNTHROID 100 MCG PO TABS
100.0000 ug | ORAL_TABLET | Freq: Every day | ORAL | 1 refills | Status: DC
  Filled 2022-06-04 (×2): qty 90, 90d supply, fill #0
  Filled 2022-06-05: qty 30, 30d supply, fill #0
  Filled 2022-06-05: qty 90, 90d supply, fill #0
  Filled 2022-06-05: qty 7, 7d supply, fill #0

## 2022-06-05 ENCOUNTER — Other Ambulatory Visit (HOSPITAL_BASED_OUTPATIENT_CLINIC_OR_DEPARTMENT_OTHER): Payer: Self-pay

## 2022-06-08 ENCOUNTER — Encounter: Payer: Self-pay | Admitting: Internal Medicine

## 2022-06-08 ENCOUNTER — Other Ambulatory Visit: Payer: Self-pay

## 2022-06-08 ENCOUNTER — Other Ambulatory Visit (HOSPITAL_BASED_OUTPATIENT_CLINIC_OR_DEPARTMENT_OTHER): Payer: Self-pay

## 2022-06-08 NOTE — Telephone Encounter (Signed)
This RX may come in from new pharmacy, I called patient. She has enough for at least a month. She wants to get 90 day supply when new rx comes

## 2022-06-09 ENCOUNTER — Ambulatory Visit (INDEPENDENT_AMBULATORY_CARE_PROVIDER_SITE_OTHER): Payer: 59 | Admitting: Family Medicine

## 2022-06-12 ENCOUNTER — Other Ambulatory Visit (HOSPITAL_COMMUNITY): Payer: Self-pay

## 2022-06-12 ENCOUNTER — Telehealth: Payer: Self-pay

## 2022-06-12 NOTE — Telephone Encounter (Signed)
Patient Advocate Encounter   Received notification from [facility] that prior authorization for Synthroid is required.   PA submitted on 06/12/2022 [facility] Insurance Med[employer] Status is pending

## 2022-06-14 ENCOUNTER — Other Ambulatory Visit (HOSPITAL_BASED_OUTPATIENT_CLINIC_OR_DEPARTMENT_OTHER): Payer: Self-pay

## 2022-06-14 NOTE — Telephone Encounter (Signed)
Synthroid  has been approved per [employer].

## 2022-06-15 ENCOUNTER — Other Ambulatory Visit: Payer: Self-pay

## 2022-06-15 MED ORDER — SYNTHROID 100 MCG PO TABS: 100.0000 ug | ORAL_TABLET | Freq: Every day | ORAL | 1 refills | Status: DC

## 2022-06-28 ENCOUNTER — Other Ambulatory Visit: Payer: Self-pay

## 2022-06-28 ENCOUNTER — Other Ambulatory Visit (HOSPITAL_COMMUNITY): Payer: Self-pay

## 2022-07-02 ENCOUNTER — Other Ambulatory Visit: Payer: Self-pay | Admitting: Internal Medicine

## 2022-07-02 ENCOUNTER — Other Ambulatory Visit: Payer: Self-pay

## 2022-07-02 ENCOUNTER — Other Ambulatory Visit (HOSPITAL_COMMUNITY): Payer: Self-pay

## 2022-07-02 MED ORDER — ROSUVASTATIN CALCIUM 20 MG PO TABS
20.0000 mg | ORAL_TABLET | Freq: Every day | ORAL | 3 refills | Status: DC
  Filled 2022-07-02: qty 90, 90d supply, fill #0
  Filled 2022-09-28: qty 90, 90d supply, fill #1
  Filled 2022-12-27: qty 90, 90d supply, fill #2
  Filled 2023-03-24: qty 90, 90d supply, fill #3

## 2022-07-07 ENCOUNTER — Ambulatory Visit (INDEPENDENT_AMBULATORY_CARE_PROVIDER_SITE_OTHER): Payer: 59 | Admitting: Family Medicine

## 2022-07-07 ENCOUNTER — Encounter (INDEPENDENT_AMBULATORY_CARE_PROVIDER_SITE_OTHER): Payer: Self-pay | Admitting: Family Medicine

## 2022-07-07 ENCOUNTER — Other Ambulatory Visit (HOSPITAL_BASED_OUTPATIENT_CLINIC_OR_DEPARTMENT_OTHER): Payer: Self-pay

## 2022-07-07 VITALS — BP 113/77 | HR 95 | Temp 98.4°F | Ht 66.0 in | Wt 241.0 lb

## 2022-07-07 DIAGNOSIS — R632 Polyphagia: Secondary | ICD-10-CM

## 2022-07-07 DIAGNOSIS — E669 Obesity, unspecified: Secondary | ICD-10-CM

## 2022-07-07 DIAGNOSIS — R4689 Other symptoms and signs involving appearance and behavior: Secondary | ICD-10-CM | POA: Diagnosis not present

## 2022-07-07 DIAGNOSIS — F3289 Other specified depressive episodes: Secondary | ICD-10-CM

## 2022-07-07 DIAGNOSIS — Z6839 Body mass index (BMI) 39.0-39.9, adult: Secondary | ICD-10-CM | POA: Diagnosis not present

## 2022-07-07 MED ORDER — ZEPBOUND 7.5 MG/0.5ML ~~LOC~~ SOAJ
7.5000 mg | SUBCUTANEOUS | 0 refills | Status: DC
Start: 2022-07-07 — End: 2022-08-10
  Filled 2022-07-07: qty 2, 28d supply, fill #0

## 2022-07-08 NOTE — Progress Notes (Signed)
Chief Complaint:   OBESITY Dana Washington is here to discuss her progress with her obesity treatment plan along with follow-up of her obesity related diagnoses. [name] is on the Category 3 Plan and states she is following her eating plan approximately 40% of the time. Dana Washington states she is on the treadmill and recumbent bike for 30-50 minutes 5 times per week.  Today's visit was #: 25 Starting weight: 273 lbs Starting date: 08/27/2020 Today's weight: 241 lbs Today's date: 07/07/2022 Total lbs lost to date: 32 Total lbs lost since last in-office visit: 0  Interim History: Patient has been on vacation and did some celebration eating, but she was able to be mindful of her choices and minimized her portions.  Subjective:   1. Polyphagia Patient increased Zepbound to 7.5 mg and she still notes some polyphagia but less than it was previously.   2. Emotional Eating Behavior Patient notes increased stress with work. She notes no change in sleep.   Assessment/Plan:   1. Polyphagia Patient will continue with her diet and Zepbound, and we will refill Zepbound 7.5 mg for 1 month.   - tirzepatide (ZEPBOUND) 7.5 MG/0.5ML Pen; Inject 7.5 mg into the skin once a week.  Dispense: 2 mL; Refill: 0  2. Emotional Eating Behavior Patient will continue Lexapro 20 mg once daily.   3. BMI 39.0-39.9,adult  4. Obesity, Beginning BMI 45.43 Shakinah is currently in the action stage of change. As such, her goal is to continue with weight loss efforts. She has agreed to the Category 3 Plan.   Exercise goals: As is.   Behavioral modification strategies: increasing lean protein intake and no skipping meals.  Dana Washington has agreed to follow-up with our clinic in 4 weeks. She was informed of the importance of frequent follow-up visits to maximize her success with intensive lifestyle modifications for her multiple health conditions.   Objective:   Blood pressure 113/77, pulse 95, temperature 98.4 F (36.9 C),  height 5\' 6"  (1.676 m), weight 241 lb (109.3 kg), SpO2 96 %. Body mass index is 38.9 kg/m.  Lab Results  Component Value Date   CREATININE 0.88 11/03/2021   BUN 13 11/03/2021   NA 140 11/03/2021   K 4.8 11/03/2021   CL 104 11/03/2021   CO2 25 11/03/2021   Lab Results  Component Value Date   ALT 16 11/03/2021   AST 17 11/03/2021   ALKPHOS 82 05/04/2021   BILITOT 0.3 11/03/2021   Lab Results  Component Value Date   HGBA1C 5.6 11/03/2021   HGBA1C 5.5 05/04/2021   HGBA1C 5.9 (H) 08/27/2020   HGBA1C 5.5 06/19/2019   HGBA1C 5.6 04/10/2018   Lab Results  Component Value Date   INSULIN 22.3 05/04/2021   INSULIN 28.0 (H) 08/27/2020   Lab Results  Component Value Date   TSH 0.72 11/03/2021   Lab Results  Component Value Date   CHOL 157 11/03/2021   HDL 63 11/03/2021   LDLCALC 77 11/03/2021   TRIG 91 11/03/2021   CHOLHDL 2.5 11/03/2021   Lab Results  Component Value Date   VD25OH 65.5 05/04/2021   VD25OH 45.7 08/27/2020   VD25OH 32 06/19/2019   Lab Results  Component Value Date   WBC 6.3 11/03/2021   HGB 16.2 (H) 11/03/2021   HCT 47.9 (H) 11/03/2021   MCV 91.8 11/03/2021   PLT 395 11/03/2021   No results found for: "IRON", "TIBC", "FERRITIN"  Attestation Statements:   Reviewed by clinician on day of visit:  allergies, medications, problem list, medical history, surgical history, family history, social history, and previous encounter notes.   I, Trixie Dredge, am acting as transcriptionist for Dennard Nip, MD.  I have reviewed the above documentation for accuracy and completeness, and I agree with the above. -  Dennard Nip, MD

## 2022-07-09 ENCOUNTER — Other Ambulatory Visit (HOSPITAL_BASED_OUTPATIENT_CLINIC_OR_DEPARTMENT_OTHER): Payer: Self-pay

## 2022-07-14 ENCOUNTER — Other Ambulatory Visit (HOSPITAL_BASED_OUTPATIENT_CLINIC_OR_DEPARTMENT_OTHER): Payer: Self-pay

## 2022-07-14 ENCOUNTER — Telehealth: Payer: 59 | Admitting: Nurse Practitioner

## 2022-07-14 DIAGNOSIS — L249 Irritant contact dermatitis, unspecified cause: Secondary | ICD-10-CM | POA: Diagnosis not present

## 2022-07-14 MED ORDER — PREDNISONE 10 MG PO TABS
ORAL_TABLET | ORAL | 0 refills | Status: DC
Start: 2022-07-14 — End: 2022-07-30
  Filled 2022-07-14: qty 37, 14d supply, fill #0

## 2022-07-14 NOTE — Progress Notes (Signed)
E Visit for Rash  We are sorry that you are not feeling well. Here is how we plan to help!  Based on what you shared with me it looks like you have contact dermatitis.  Contact dermatitis is a skin rash caused by something that touches the skin and causes irritation or inflammation.  Your skin may be red, swollen, dry, cracked, and itch.  The rash should go away in a few days but can last a few weeks.  If you get a rash, it's important to figure out what caused it so the irritant can be avoided in the future. and I am prescribing a two week course of steroids (37 tablets of 10 mg prednisone).  Days 1-4 take 4 tablets (40 mg) daily  Days 5-8 take 3 tablets (30 mg) daily, Days 9-11 take 2 tablets (20 mg) daily, Days 12-14 take 1 tablet (10 mg) daily.    If you are not currently taking a daily allergy medication starting one like Claritin 10mg  daily will also be helpful.  If you have started using any new products (lotions, moisturizer, face products, shampoo/conditioner), we recommend stopping anything new that might be an irritant   HOME CARE:  Take cool showers and avoid direct sunlight. Apply cool compress or wet dressings. Take a bath in an oatmeal bath.  Sprinkle content of one Aveeno packet under running faucet with comfortably warm water.  Bathe for 15-20 minutes, 1-2 times daily.  Pat dry with a towel. Do not rub the rash. Use hydrocortisone cream. Take an antihistamine like Benadryl for widespread rashes that itch.  The adult dose of Benadryl is 25-50 mg by mouth 4 times daily. Caution:  This type of medication may cause sleepiness.  Do not drink alcohol, drive, or operate dangerous machinery while taking antihistamines.  Do not take these medications if you have prostate enlargement.  Read package instructions thoroughly on all medications that you take.  GET HELP RIGHT AWAY IF:  Symptoms don't go away after treatment. Severe itching that persists. If you rash spreads or swells. If  you rash begins to smell. If it blisters and opens or develops a yellow-brown crust. You develop a fever. You have a sore throat. You become short of breath.  MAKE SURE YOU:  Understand these instructions. Will watch your condition. Will get help right away if you are not doing well or get worse.  Thank you for choosing an e-visit.  Your e-visit answers were reviewed by a board certified advanced clinical practitioner to complete your personal care plan. Depending upon the condition, your plan could have included both over the counter or prescription medications.  Please review your pharmacy choice. Make sure the pharmacy is open so you can pick up prescription now. If there is a problem, you may contact your provider through [employer] and have the prescription routed to another pharmacy.  Your safety is important to [place]. If you have drug allergies check your prescription carefully.   For the next 24 hours you can use MyChart to ask questions about today's visit, request a non-urgent call back, or ask for a work or school excuse. You will get an email in the next two days asking about your experience. I hope that your e-visit has been valuable and will speed your recovery.   Meds ordered this encounter  Medications   predniSONE (DELTASONE) 10 MG tablet    Sig: Take 4 tablets (40mg ) on days 1-4, then 3 tablets (30mg ) on days 5-8, then  2 tablets (20mg ) on days 9-11, then 1 tablet daily for days 12-14. Take with food.    Dispense:  37 tablet    Refill:  0    I spent approximately 5 minutes reviewing the patient's history, current symptoms and coordinating their care today.

## 2022-07-16 ENCOUNTER — Other Ambulatory Visit (HOSPITAL_BASED_OUTPATIENT_CLINIC_OR_DEPARTMENT_OTHER): Payer: Self-pay

## 2022-07-16 ENCOUNTER — Telehealth: Payer: 59 | Admitting: Emergency Medicine

## 2022-07-16 DIAGNOSIS — B029 Zoster without complications: Secondary | ICD-10-CM

## 2022-07-16 IMAGING — DX DG CHEST 2V
2 series · 2 of 2 positions shown · non-contrast
Comparison: 03/01/2014

CLINICAL DATA: Cough for several weeks. Recent 5ovid-IF viral
infection.

EXAM:
CHEST - 2 VIEW

[chest pa]
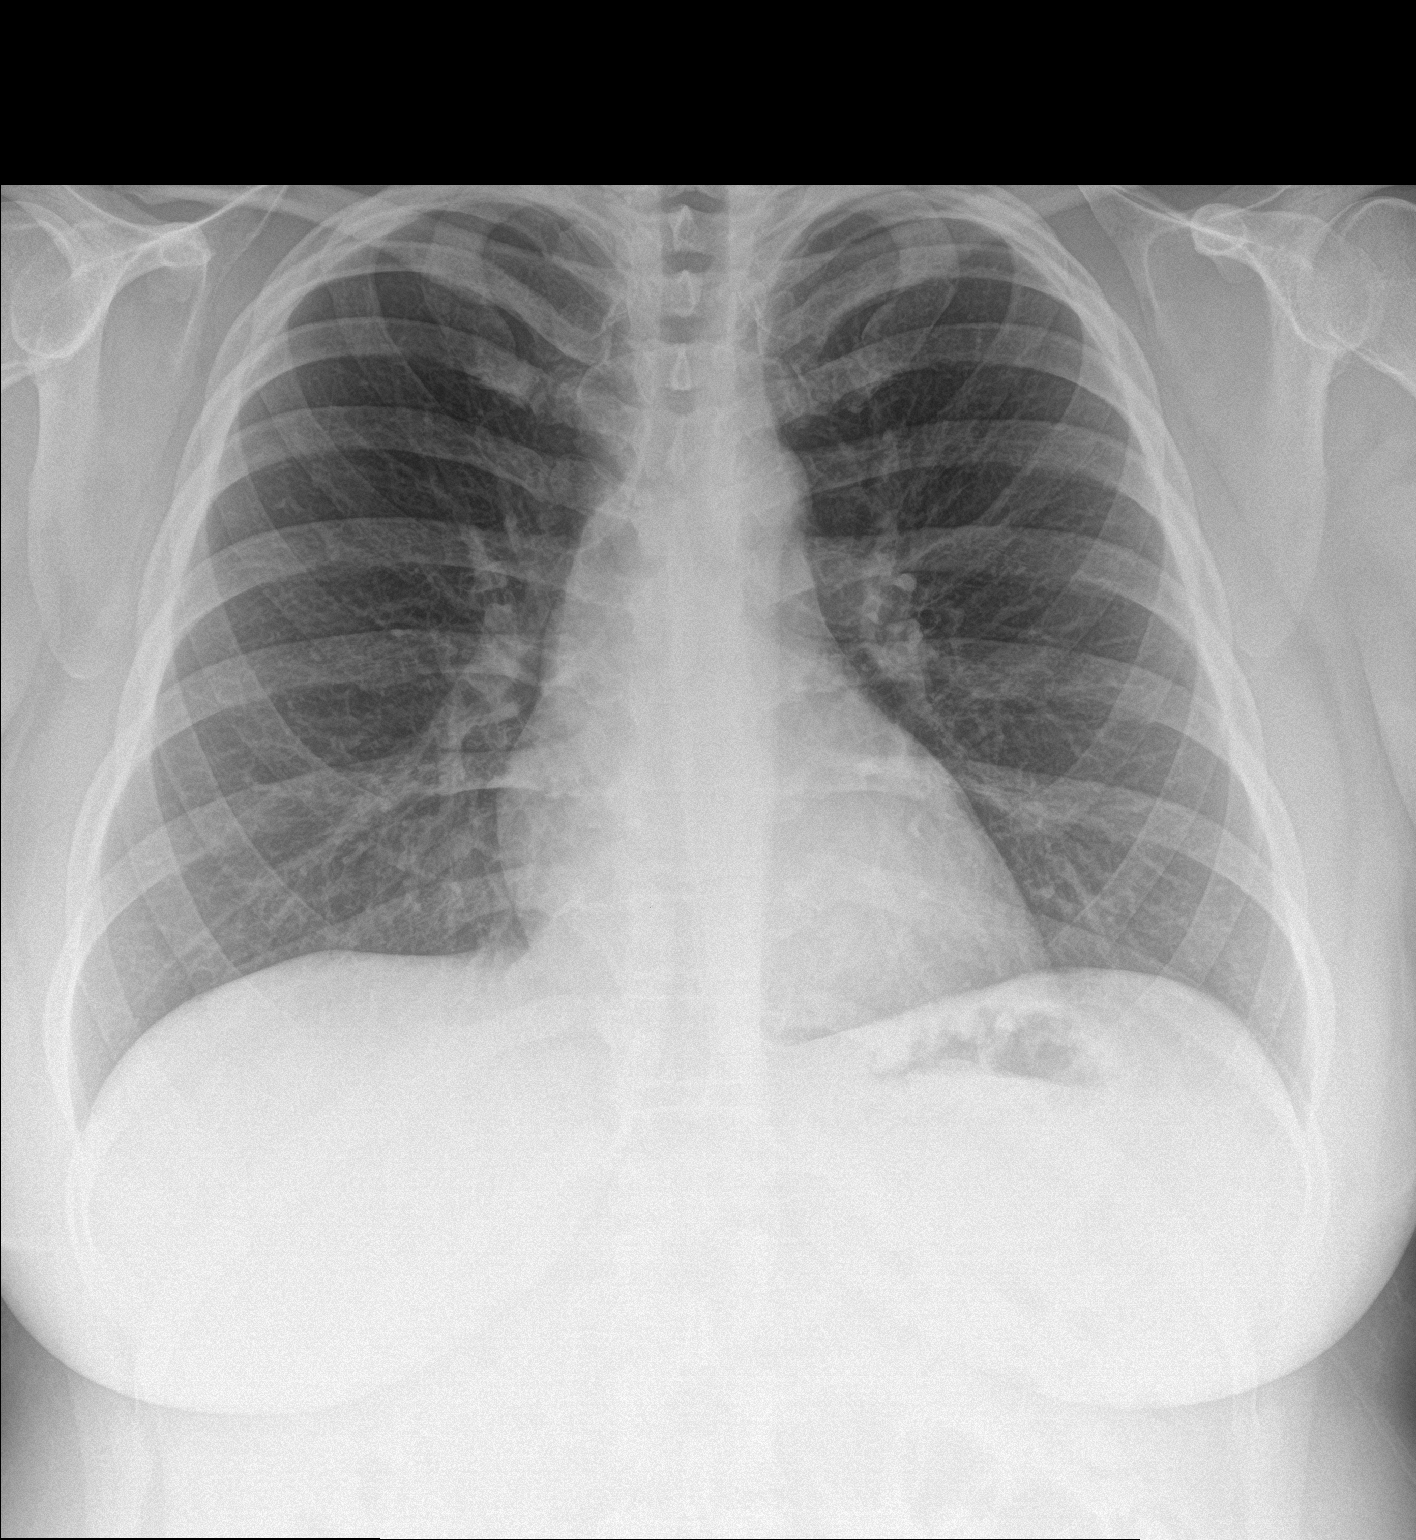

[chest lat]
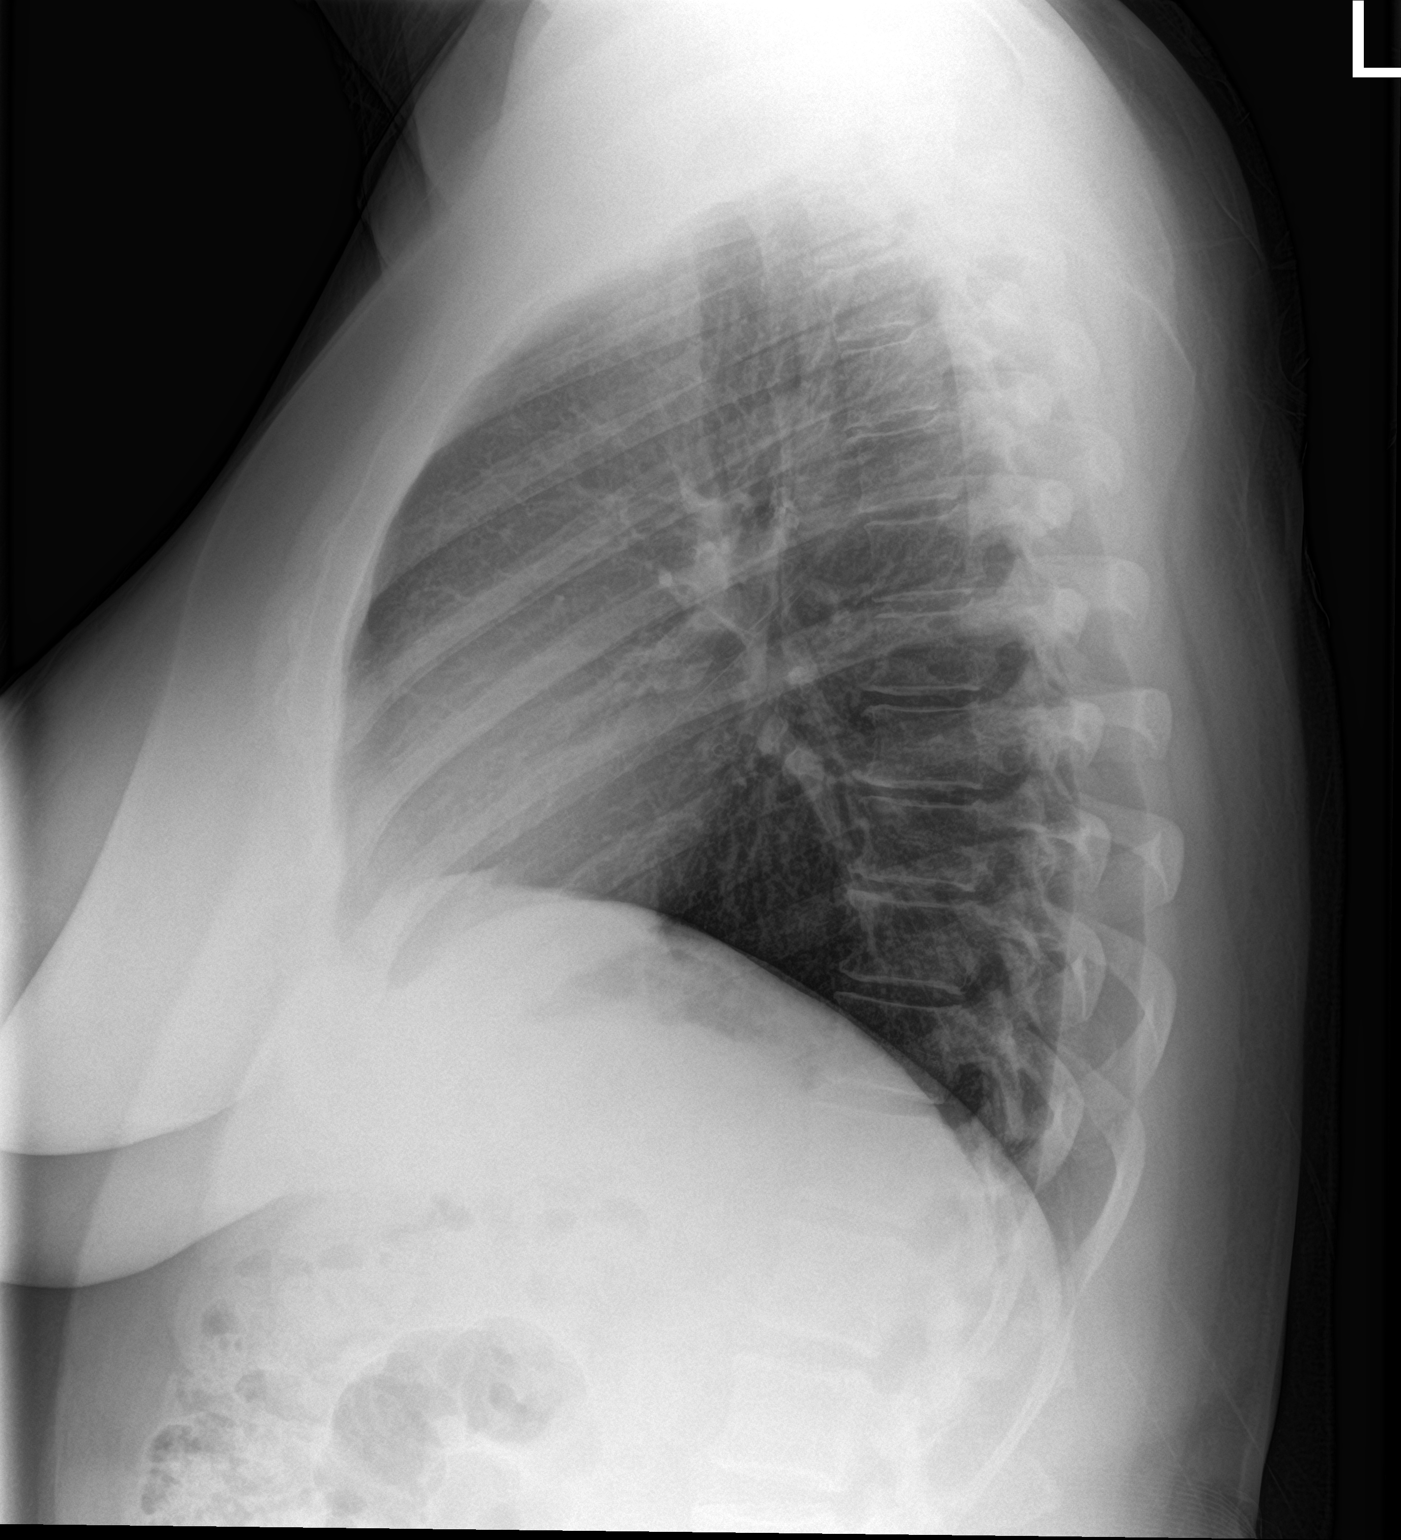

[2 of 2 positions shown; findings below may reference images not displayed]

FINDINGS: The heart size and mediastinal contours are within normal limits.
Both lungs are clear. The visualized skeletal structures are
unremarkable.
IMPRESSION: No active cardiopulmonary disease.

## 2022-07-16 MED ORDER — VALACYCLOVIR HCL 1 G PO TABS
1000.0000 mg | ORAL_TABLET | Freq: Three times a day (TID) | ORAL | 0 refills | Status: DC
  Filled 2022-07-16: qty 21, 7d supply, fill #0

## 2022-07-16 NOTE — Progress Notes (Signed)
Virtual Visit Consent   Dana Washington, you are scheduled for a virtual visit with a [facility] provider today. Just as with appointments in the office, your consent must be obtained to participate. Your consent will be active for this visit and any virtual visit you may have with one of our providers in the next 365 days. If you have a MyChart account, a copy of this consent can be sent to you electronically.  As this is a virtual visit, video technology does not allow for your provider to perform a traditional examination. This may limit your provider's ability to fully assess your condition. If your provider identifies any concerns that need to be evaluated in person or the need to arrange testing (such as labs, EKG, etc.), we will make arrangements to do so. Although advances in technology are sophisticated, we cannot ensure that it will always work on either your end or our end. If the connection with a video visit is poor, the visit may have to be switched to a telephone visit. With either a video or telephone visit, we are not always able to ensure that we have a secure connection.  By engaging in this virtual visit, you consent to the provision of healthcare and authorize for your insurance to be billed (if applicable) for the services provided during this visit. Depending on your insurance coverage, you may receive a charge related to this service.  I need to obtain your verbal consent now. Are you willing to proceed with your visit today? Dana Washington has provided verbal consent on 07/16/2022 for a virtual visit (video or telephone). Roxy Horseman, PA-C  Date: 07/16/2022 12:22 PM  Virtual Visit via Video Note   I, Roxy Horseman, connected with  Dana Washington  ([id number], [birth year]) on 07/16/22 at 12:30 PM EDT by a video-enabled telemedicine application and verified that I am speaking with the correct person using two identifiers.  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 The patient expressed understanding and agreed to proceed.    History of Present Illness: Dana Washington is a 47 y.o. who identifies as a female who was assigned female at birth, and is being seen today for rash behind left ear.  Has been taking prednisone and cortisone.  She states that she has had shingles in the past and is concerned that it has recurred.  She states that she has some pain over her left eyebrow with a couple of spots starting to pop up and some pain in left preauricular region, but without rash.  She states that she has had some dryness sensation to eye, but thinks it's because she got cortisone in the eye.  HPI: HPI  Problems:  Patient Active Problem List   Diagnosis Date Noted   Stress 04/29/2022   BMI 39.0-39.9,adult 03/10/2022   Depression 03/10/2022   Polyphagia 03/10/2022   Other constipation 02/03/2022   Controlled type 2 diabetes mellitus with complication, without long-term current use of insulin (HCC) 02/03/2022   BMI 40.0-44.9, adult (HCC) 02/03/2022   Migraine without aura and without status migrainosus, not intractable 12/23/2021   Other hyperlipidemia 06/01/2021   Essential hypertension 06/01/2021   Hx of sessile serrated colonic polyps 03/04/2021   Prediabetes 08/28/2020   S/P left knee arthroscopy 10/25/2017   Knee pain 10/03/2017   Hashimoto's thyroiditis 08/05/2017  Vitamin D deficiency 04/11/2015   OCD (obsessive compulsive disorder) 07/09/2013   Anxiety state, unspecified 07/09/2013   Hypertension 07/10/2011   Depression with anxiety 06/11/2011   History of migraine headaches 04/08/2011   Hyperlipidemia 01/07/2011   Impaired glucose tolerance 01/07/2011   Generalized obesity 01/07/2011   Hypothyroidism 01/07/2011    Allergies:   Allergies  Allergen Reactions   Bee Venom Hives   Medications:  Current Outpatient Medications:    valACYclovir (VALTREX) 1000 MG tablet, Take 1 tablet (1,000 mg total) by mouth 3 (three) times daily., Disp: 21 tablet, Rfl: 0   ALPRAZolam (XANAX) 0.5 MG tablet, Take 1 tablet (0.5 mg total) by mouth 2 (two) times daily as needed for anxiety., Disp: 60 tablet, Rfl: 1   benazepril-hydrochlorthiazide (LOTENSIN HCT) 20-12.5 MG tablet, Take 1 tablet by mouth daily., Disp: 90 tablet, Rfl: 3   doxycycline (VIBRA-TABS) 100 MG tablet, Take 1 tablet (100 mg total) by mouth daily., Disp: 24 tablet, Rfl: 0   escitalopram (LEXAPRO) 20 MG tablet, Take 1 tablet (20 mg total) by mouth daily., Disp: 90 tablet, Rfl: 0   HYDROcodone-acetaminophen (NORCO/VICODIN) 5-325 MG tablet, Take 1 tablet by mouth every 6 (six) hours as needed for moderate pain., Disp: 30 tablet, Rfl: 0   norethindrone-ethinyl estradiol-iron (BLISOVI FE 1.5/30) 1.5-30 MG-MCG tablet, Take 1 tablet by mouth daily., Disp: 84 tablet, Rfl: 4   omeprazole (PRILOSEC) 20 MG capsule, Take 1 capsule (20 mg total) by mouth daily., Disp: 90 capsule, Rfl: 3   ondansetron (ZOFRAN) 4 MG tablet, Take 1 tablet (4 mg total) by mouth every 8 (eight) hours as needed for nausea or vomiting., Disp: 20 tablet, Rfl: 1   polyethylene glycol powder (GLYCOLAX/MIRALAX) 17 GM/SCOOP powder, Take 17 g by mouth 2 (two) times daily as needed., Disp: 3350 g, Rfl: 1   predniSONE (DELTASONE) 10 MG tablet, Take 4 tablets (40mg ) on days 1-4, then 3 tablets (30mg ) on days 5-8, then 2 tablets (20mg ) on days 9-11, then 1 tablet daily for days 12-14. Take with food., Disp: 37 tablet, Rfl: 0   Rimegepant Sulfate (NURTEC) 75 MG TBDP, Take 75 mg by mouth daily as needed. For migraines. Take as close to onset of migraine as possible. One daily maximum. (Patient taking differently: Take 75 mg by mouth as needed. For migraines. Take as close to onset of migraine as possible. One daily  maximum.), Disp: 6 tablet, Rfl: 0   rosuvastatin (CRESTOR) 20 MG tablet, Take 1 tablet (20 mg total) by mouth daily., Disp: 90 tablet, Rfl: 3   SYNTHROID 100 MCG tablet, Take 1 tablet (100 mcg total) by mouth daily., Disp: 90 tablet, Rfl: 1   tirzepatide (ZEPBOUND) 5 MG/0.5ML Pen, Inject 5 mg into the skin once a week., Disp: 6 mL, Rfl: 0   tirzepatide (ZEPBOUND) 7.5 MG/0.5ML Pen, Inject 7.5 mg into the skin once a week., Disp: 2 mL, Rfl: 0   topiramate (TOPAMAX) 100 MG tablet, Take 1 tablet (100 mg total) by mouth at bedtime., Disp: 90 tablet, Rfl: 2  Observations/Objective: Patient is well-developed, well-nourished in no acute distress.  Resting comfortably at work.  Head is normocephalic, atraumatic.  No labored breathing.  Speech is clear and coherent with logical content.  Patient is alert and oriented at baseline.  Erythematous lesions post auricularly   Assessment and Plan: 1. Herpes zoster without complication   Meds ordered this encounter  Medications   valACYclovir (VALTREX) 1000 MG tablet    Sig: Take 1 tablet (1,000 mg  total) by mouth 3 (three) times daily.    Dispense:  21 tablet    Refill:  0    Order Specific Question:   Supervising Provider    Answer:   Merrilee Jansky [id number]     Give dermatomal distribution of the rash, I have concern for shingles.  Will add valtrex to the prednisone she is already taking.  Recommend close follow-up if worsening.  Encouraged patient to pay close attention to her eye and if any symptoms worsen, she should have formal eye exam to rule out herpes ophthalmicus.     Follow Up Instructions: I discussed the assessment and treatment plan with the patient. The patient was provided an opportunity to ask questions and all were answered. The patient agreed with the plan and demonstrated an understanding of the instructions.  A copy of instructions were sent to the patient via MyChart unless otherwise noted below.     The patient  was advised to call back or seek an in-person evaluation if the symptoms worsen or if the condition fails to improve as anticipated.  Time:  I spent 11 minutes with the patient via telehealth technology discussing the above problems/concerns.    Roxy Horseman, PA-C

## 2022-07-16 NOTE — Patient Instructions (Signed)
[employer], thank you for joining Roxy Horseman, PA-C for today's virtual visit.  While this provider is not your primary care provider (PCP), if your PCP is located in our provider database this encounter information will be shared with them immediately following your visit.   A [facility] MyChart account gives you access to today's visit and all your visits, tests, and labs performed at [facility] " click here if you don't have a [facility] MyChart account or go to [email or web address]  Consent: (Patient) Dana Washington provided verbal consent for this virtual visit at the beginning of the encounter.  Current Medications:  Current Outpatient Medications:    valACYclovir (VALTREX) 1000 MG tablet, Take 1 tablet (1,000 mg total) by mouth 3 (three) times daily., Disp: 21 tablet, Rfl: 0   ALPRAZolam (XANAX) 0.5 MG tablet, Take 1 tablet (0.5 mg total) by mouth 2 (two) times daily as needed for anxiety., Disp: 60 tablet, Rfl: 1   benazepril-hydrochlorthiazide (LOTENSIN HCT) 20-12.5 MG tablet, Take 1 tablet by mouth daily., Disp: 90 tablet, Rfl: 3   doxycycline (VIBRA-TABS) 100 MG tablet, Take 1 tablet (100 mg total) by mouth daily., Disp: 24 tablet, Rfl: 0   escitalopram (LEXAPRO) 20 MG tablet, Take 1 tablet (20 mg total) by mouth daily., Disp: 90 tablet, Rfl: 0   HYDROcodone-acetaminophen (NORCO/VICODIN) 5-325 MG tablet, Take 1 tablet by mouth every 6 (six) hours as needed for moderate pain., Disp: 30 tablet, Rfl: 0   norethindrone-ethinyl estradiol-iron (BLISOVI FE 1.5/30) 1.5-30 MG-MCG tablet, Take 1 tablet by mouth daily., Disp: 84 tablet, Rfl: 4   omeprazole (PRILOSEC) 20 MG capsule, Take 1 capsule (20 mg total) by mouth daily., Disp: 90 capsule, Rfl: 3   ondansetron (ZOFRAN) 4 MG tablet, Take 1 tablet (4 mg total) by mouth every 8 (eight) hours as needed for nausea or vomiting., Disp: 20 tablet, Rfl: 1   polyethylene glycol powder (GLYCOLAX/MIRALAX) 17  GM/SCOOP powder, Take 17 g by mouth 2 (two) times daily as needed., Disp: 3350 g, Rfl: 1   predniSONE (DELTASONE) 10 MG tablet, Take 4 tablets (40mg ) on days 1-4, then 3 tablets (30mg ) on days 5-8, then 2 tablets (20mg ) on days 9-11, then 1 tablet daily for days 12-14. Take with food., Disp: 37 tablet, Rfl: 0   Rimegepant Sulfate (NURTEC) 75 MG TBDP, Take 75 mg by mouth daily as needed. For migraines. Take as close to onset of migraine as possible. One daily maximum. (Patient taking differently: Take 75 mg by mouth as needed. For migraines. Take as close to onset of migraine as possible. One daily maximum.), Disp: 6 tablet, Rfl: 0   rosuvastatin (CRESTOR) 20 MG tablet, Take 1 tablet (20 mg total) by mouth daily., Disp: 90 tablet, Rfl: 3   SYNTHROID 100 MCG tablet, Take 1 tablet (100 mcg total) by mouth daily., Disp: 90 tablet, Rfl: 1   tirzepatide (ZEPBOUND) 5 MG/0.5ML Pen, Inject 5 mg into the skin once a week., Disp: 6 mL, Rfl: 0   tirzepatide (ZEPBOUND) 7.5 MG/0.5ML Pen, Inject 7.5 mg into the skin once a week., Disp: 2 mL, Rfl: 0   topiramate (TOPAMAX) 100 MG tablet, Take 1 tablet (100 mg total) by mouth at bedtime., Disp: 90 tablet, Rfl: 2   Medications ordered in this encounter:  Meds ordered this encounter  Medications   valACYclovir (VALTREX) 1000 MG tablet    Sig: Take 1 tablet (1,000 mg total) by mouth 3 (three) times daily.    Dispense:  21  tablet    Refill:  0    Order Specific Question:   Supervising Provider    Answer:   Merrilee Jansky [[id number]]     *If you need refills on other medications prior to your next appointment, please contact your pharmacy*  Follow-Up: Call back or seek an in-person evaluation if the symptoms worsen or if the condition fails to improve as anticipated.  [facility] [phone]  Other Instructions If you have worsening eye pain, irritation, or dryness sensation, you should have a formal eye exam to rule out herpes  ophthalmicus.   If you have been instructed to have an in-person evaluation today at a local Urgent Care facility, please use the link below. It will take you to a list of all of our available [facility], including address, phone number and hours of operation. Please do not delay care.  [facility]  If you or a family member do not have a primary care provider, use the link below to schedule a visit and establish care. When you choose a [facility] primary care physician or advanced practice provider, you gain a long-term partner in health. Find a Primary Care Provider  Learn more about 's in-office and virtual care options:  - Get Care Now

## 2022-07-30 ENCOUNTER — Encounter: Payer: Self-pay | Admitting: Psychiatry

## 2022-07-30 ENCOUNTER — Other Ambulatory Visit (HOSPITAL_BASED_OUTPATIENT_CLINIC_OR_DEPARTMENT_OTHER): Payer: Self-pay

## 2022-07-30 ENCOUNTER — Ambulatory Visit (INDEPENDENT_AMBULATORY_CARE_PROVIDER_SITE_OTHER): Payer: 59 | Admitting: Psychiatry

## 2022-07-30 VITALS — BP 122/82 | HR 105 | Ht 65.5 in | Wt 245.0 lb

## 2022-07-30 DIAGNOSIS — F411 Generalized anxiety disorder: Secondary | ICD-10-CM

## 2022-07-30 DIAGNOSIS — G4700 Insomnia, unspecified: Secondary | ICD-10-CM | POA: Diagnosis not present

## 2022-07-30 DIAGNOSIS — F4010 Social phobia, unspecified: Secondary | ICD-10-CM | POA: Diagnosis not present

## 2022-07-30 MED ORDER — ESCITALOPRAM OXALATE 20 MG PO TABS: ORAL_TABLET | ORAL | Status: DC

## 2022-07-30 MED ORDER — VILAZODONE HCL 10 MG PO TABS
ORAL_TABLET | ORAL | 1 refills | Status: DC
Start: 2022-07-30 — End: 2022-09-10
  Filled 2022-07-30: qty 30, 30d supply, fill #0
  Filled 2022-08-24: qty 30, 30d supply, fill #1

## 2022-07-30 NOTE — Progress Notes (Signed)
[facility] MD/PA/NP Initial Note  07/30/2022 3:18 PM Dana Washington  MRN:  [id number]  Chief Complaint:  Chief Complaint   Anxiety     HPI: Pt is a 47 yo female being seen for initial evaluation for anxiety. She reports that she has periods of tearfulness in stressful situations and when she experiences frustration. She reports that at times she is unable to control crying.   She reports long-standing worry since childhood. She reports, "I worry about everything." She reports that she will worry about things that she recognizes she does not need to worry about. Unable to stop worrying about things at times and experiences rumination. She reports that at times she will worry that she has done something wrong despite reassurance from others that she has done a good job. She reports that she holds herself to a high standard and has some perfectionistic tendencies. She reports that she is unsure why she holds herself to certain standards and reports that no one in her childhood placed pressure on her to be perfect or shamed her if she made a mistake. She reports some checking behaviors at times, to include doors/locks if she is the last person to leave home. She reports that she rarely checks the doors now that her mother is living with her. She will feel compelled to straighten certain things, such as someone's seatbelt is not flat. Denies compulsive counting or grouping. Denies any fears of contamination. She reports that checking and straightening behaviors do not interfere with functioning, cause her to be late, or cause severe distress.  She reports that in the past she has not recognized physical symptoms of anxiety, and now questions if some health issues have been stress-related. She reports chronic heartburn and "I always think I am having a heart attack" and reports that it could be stress related. She reports that she clinches her jaw and has a history or TMJ.   She reports that she  typically does not remember her dreams. She reports that at times she will wake up thinking about something and is not sure if it was real or a dream. She will then look for evidence if it did occur because "there should be spiraling events" if something did happen. Denies other intrusive thoughts. She describes having some catastrophic thoughts in childhood at times.   Avoids social situations. She reports, "I have severe social anxiety. I don't go places where I don't know anybody."  Does not like large crowds. She prefers to have someone she knows go with her if going somewhere new. She went on a medical mission trip in [place] earlier this year and reports that this caused severe anxiety and crying since she did not know anyone and was in an entirely unfamiliar environment.  Denies persistent depressed mood. Reports that she has brief periods of sadness when she thinks about certain things. She reports that sadness does not last longer than a few hours. Energy and motivation are ok. She reports that she has been trying to lose weight. She reports that she exercises daily. She reports life-long difficulty with sleep. "I sleep on and off all night." Estimates sleeping 5-6 hours a night with interruptions. She reports adequate concentration and focus. She reports that she is "always hungry."  Denies any past manic symptoms. Denies past depressive symptoms. Denies AH or VH. Denies paranoia.    Born and raised in [place]. Parents were married for 27 years and then divorced. Father remarried x 2. Father, mother, and  step-mother are now good friends and celebrate holidays together. Father is a retired Optician, dispensing. She has one older sister who moved to this area about 30 years ago. Sister has 2 children, ages 46 yo and 11 yo. She is close to her sister's children. Father retired and moved to this area. Patient moved to this area 12 years ago at the urging of her sister. Mother later retired, moved to [facility], and  now lives with her. She has an Education officer, museum in psychology and masters in health administration.  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 Never been married. Currently not in a relationship. Family are her primary supports. She used to socialize with peers until being promoted. 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 She enjoys reading.   Past Psychiatric Medication Trials: Lexapro- Started in Feb 2024. She feels that this has been helpful for tearfulness. Seemed to be initially more effect. Prozac- Ineffective.  Paxil- Weight gain Sertraline Cymbalta Xanax- Rarely takes.  Topamax- prescribed for headaches  Reports being very sensitive to medications.  Visit Diagnosis:    ICD-10-CM   1. Generalized anxiety disorder  F41.1 Vilazodone HCl (VIIBRYD) 10 MG TABS    2. Social anxiety disorder  F40.10 Vilazodone HCl (VIIBRYD) 10 MG TABS    3. Insomnia, unspecified type  G47.00       Past Psychiatric History: Briefly saw a therapist in school.   Past Medical History:  Past Medical History:  Diagnosis Date   Allergy    Anxiety    Colon polyps    Constipation    Diabetes mellitus without complication (HCC)    Fundic gland polyps of stomach, benign    GERD (gastroesophageal reflux disease)    Glucose intolerance (impaired glucose tolerance)    Herpes dermatitis    History of motion sickness    Hx of Hashimoto thyroiditis    Hx of sessile serrated colonic polyps 03/04/2021   Hyperlipidemia    Hypertension    Hypothyroidism, secondary    Impingement syndrome of right shoulder    Insomnia    Metabolic syndrome    Migraine    OA (osteoarthritis)    RIGHT SHOULDER AC JOINT   Obese    OCD (obsessive compulsive disorder)    PONV (postoperative nausea and vomiting)    SEVERE after breast reduction   TMJ (dislocation of temporomandibular joint)    Vitamin D deficiency    Vitamin D  deficiency     Past Surgical History:  Procedure Laterality Date   biopsy of lymph node  FEB 2010   BENIGN   BREAST BIOPSY Bilateral    BREAST EXCISIONAL BIOPSY Left    benign   BREAST REDUCTION SURGERY Bilateral MAY 2010   CARPAL TUNNEL RELEASE Bilateral 2012   CHONDROPLASTY Left 10/25/2017   Procedure: CHONDROPLASTY;  Surgeon: Eugenia Mcalpine, MD;  [facility];  Service: Orthopedics;  Laterality: Left;   EXCISION MASS ABDOMINAL N/A 04/14/2017   Procedure: EXCISION OF SUBCUTANEOUS LIPOMA OF LEFT UPPER ABDOMINAL WALL;  Surgeon: Manus Rudd, MD;  Location: [facility];  Service: General;  Laterality: N/A;   KNEE ARTHROSCOPY WITH MEDIAL MENISECTOMY Left 10/25/2017   Procedure: LEFT KNEE ARTHROSCOPY WITH MEDIAL AND LATERAL MENISECTOMY;  Surgeon: Eugenia Mcalpine, MD;  Location: [facility];  Service: Orthopedics;  Laterality: Left;   LASIK  2008   REDUCTION MAMMAPLASTY Bilateral  SHOULDER ARTHROSCOPY WITH ROTATOR CUFF REPAIR AND SUBACROMIAL DECOMPRESSION Right 09/19/2012   Procedure: RIGHT SHOULDER EXAMINE UNDER ANESTHESIA, ARTHROSCOPY,  DEBRIDEMENT, SUBACROMIAL DECOMPRESSION, DISTAL CLAVICLE RESECTION AND  ROTATOR CUFF REPAIR, LABRAL REPAIR ;  Surgeon: Eugenia Mcalpine, MD;  Location: [facility];  Service: Orthopedics;  Laterality: Right;   WISDOM TOOTH EXTRACTION  AGE 77    Family Psychiatric History: No known family psychiatric history.   Family History:  Family History  Problem Relation Age of Onset   High Cholesterol Mother    Obesity Mother    Thyroid disease Mother    High Cholesterol Father    Cancer Father    Obesity Father    ADD / ADHD Sister    Migraines Sister    Heart disease Maternal Grandfather    Colon cancer Maternal Grandfather        ds in his late 79's   Heart disease Paternal Grandmother    Esophageal cancer Other        mat great aunt, non-smoker   ADD / ADHD Niece    Rectal cancer Neg  Hx    Stomach cancer Neg Hx    Colon polyps Neg Hx     Social History:  Social History   Socioeconomic History   Marital status: Single    Spouse name: n/a   Number of children: 0   Years of education: Not on file   Highest education level: Not on file  Occupational History   Occupation: Pediatric ECHO sonographer    Employer: [employer]  Tobacco Use   Smoking status: Never   Smokeless tobacco: Never  Vaping Use   Vaping status: Never Used  Substance and Sexual Activity   Alcohol use: Yes    Comment: social   Drug use: No   Sexual activity: Not Currently    Birth control/protection: Pill  Other Topics Concern   Not on file  Social History Narrative   Lived alone until her mother moved in (retired and moved from [place]).   Her sister and father also live locally.   Caffeine: 2 cups coffee/day   Social Determinants of Health   Financial Resource Strain: Not on file  Food Insecurity: Not on file  Transportation Needs: Not on file  Physical Activity: Not on file  Stress: Not on file  Social Connections: Not on file    Allergies:  Allergies  Allergen Reactions   Bee Venom Hives    Metabolic Disorder Labs: Lab Results  Component Value Date   HGBA1C 5.6 11/03/2021   MPG 114 11/03/2021   MPG 111 06/19/2019   No results found for: "PROLACTIN" Lab Results  Component Value Date   CHOL 157 11/03/2021   TRIG 91 11/03/2021   HDL 63 11/03/2021   CHOLHDL 2.5 11/03/2021   VLDL 29 03/29/2016   LDLCALC 77 11/03/2021   LDLCALC 76 05/04/2021   Lab Results  Component Value Date   TSH 0.72 11/03/2021   TSH 0.934 05/04/2021    Therapeutic Level Labs: No results found for: "LITHIUM" No results found for: "VALPROATE" No results found for: "CBMZ"  Current Medications: Current Outpatient Medications  Medication Sig Dispense Refill   ALPRAZolam (XANAX) 0.5 MG tablet Take 1 tablet (0.5 mg total) by mouth 2 (two) times daily as needed for anxiety. 60 tablet 1    benazepril-hydrochlorthiazide (LOTENSIN HCT) 20-12.5 MG tablet Take 1 tablet by mouth daily. 90 tablet 3   cholecalciferol (VITAMIN D3) 25 MCG (1000 UNIT) tablet Take 1,000  Units by mouth daily.     norethindrone-ethinyl estradiol-iron (BLISOVI FE 1.5/30) 1.5-30 MG-MCG tablet Take 1 tablet by mouth daily. 84 tablet 4   omeprazole (PRILOSEC) 20 MG capsule Take 1 capsule (20 mg total) by mouth daily. 90 capsule 3   polyethylene glycol powder (GLYCOLAX/MIRALAX) 17 GM/SCOOP powder Take 17 g by mouth 2 (two) times daily as needed. 3350 g 1   rosuvastatin (CRESTOR) 20 MG tablet Take 1 tablet (20 mg total) by mouth daily. 90 tablet 3   SYNTHROID 100 MCG tablet Take 1 tablet (100 mcg total) by mouth daily. 90 tablet 1   tirzepatide (ZEPBOUND) 7.5 MG/0.5ML Pen Inject 7.5 mg into the skin once a week. 2 mL 0   topiramate (TOPAMAX) 100 MG tablet Take 1 tablet (100 mg total) by mouth at bedtime. 90 tablet 2   Vilazodone HCl (VIIBRYD) 10 MG TABS Take 1/2 tablet daily with breakfast for one week, then increase to 1 tablet daily with breakfast 30 tablet 1   escitalopram (LEXAPRO) 20 MG tablet Take 1/2 tablet daily for one week, then stop     HYDROcodone-acetaminophen (NORCO/VICODIN) 5-325 MG tablet Take 1 tablet by mouth every 6 (six) hours as needed for moderate pain. 30 tablet 0   ondansetron (ZOFRAN) 4 MG tablet Take 1 tablet (4 mg total) by mouth every 8 (eight) hours as needed for nausea or vomiting. 20 tablet 1   Rimegepant Sulfate (NURTEC) 75 MG TBDP Take 75 mg by mouth daily as needed. For migraines. Take as close to onset of migraine as possible. One daily maximum. (Patient taking differently: Take 75 mg by mouth as needed. For migraines. Take as close to onset of migraine as possible. One daily maximum.) 6 tablet 0   No current facility-administered medications for this visit.    Medication Side Effects: none  Orders placed this visit:  No orders of the defined types were placed in this  encounter.   Psychiatric Specialty Exam:  Review of Systems  Constitutional: Negative.   HENT: Negative.    Eyes: Negative.   Respiratory: Negative.    Cardiovascular: Negative.   Gastrointestinal:  Positive for constipation.  Endocrine: Negative.   Genitourinary: Negative.   Musculoskeletal: Negative.  Negative for gait problem.  Skin: Negative.   Allergic/Immunologic: Negative.   Hematological: Negative.   Psychiatric/Behavioral:         Please refer to HPI    Blood pressure 122/82, pulse (!) 105, height 5' 5.5" (1.664 m), weight 245 lb (111.1 kg).Body mass index is 40.15 kg/m.  General Appearance: Neat  Eye Contact:  Good  Speech:  Clear and Coherent and Normal Rate  Volume:  Normal  Mood:  Anxious  Affect:  Appropriate, Congruent, Tearful, and Anxious  Thought Process:  Coherent, Goal Directed, Linear, and Descriptions of Associations: Intact  Orientation:  Full (Time, Place, and Person)  Thought Content: Logical, Hallucinations: None, and Rumination   Suicidal Thoughts:  No  Homicidal Thoughts:  No  Memory:  WNL  Judgement:  Good  Insight:  Good  Psychomotor Activity:  Normal  Concentration:  Concentration: Good and Attention Span: Good  Recall:  Good  Fund of Knowledge: Good  Language: Good  Assets:  Communication Skills Desire for Improvement Resilience Social Support Vocational/Educational  ADL's:  Intact  Cognition: WNL  Prognosis:  Good   Screenings:  PHQ2-9    Flowsheet Row Office Visit from 03/04/2022 in [facility], MD Office Visit from 12/17/2021 in [facility], MD Office Visit  from 11/06/2021 in [facility], MD Office Visit from 08/27/2020 in [facility] Visit from 07/22/2020 in [facility], MD  PHQ-2 Total Score 0 0 0 1 0  PHQ-9 Total Score -- -- -- 3 --      Flowsheet Row ED from 02/08/2021 in [facility] Emergency Department at [facility] ED from 09/04/2020 in [facility]  Emergency Department at [facility]  C-SSRS RISK CATEGORY No Risk No Risk       Receiving Psychotherapy: No   Treatment Plan/Recommendations: Pt seen for 65 minutes and time spent dedicated to the care of this patient on the date of this encounter to include review of records, ordering of medication, post visit documentation, and face-to-face time with the patient discussing review of symptoms, history, past medication trials, and possible treatment options to include Viibryd, Buspar, or Sertraline. Discussed that she has taken 4 different SSRI's with pattern of some initial improvement in tearfulness and anxiety, followed by medication no longer being as effective. She reports that initially Lexapro was effective for her anxiety and no longer feels that she is receiving benefit from Lexapro. Discussed potential benefits, risks, and side effects of Viibryd. Patient agrees to trial of Viibryd. Will start Viibryd 10 mg 1/2 tablet daily with breakfast for one week, then increase to 10 mg daily with breakfast for anxiety. Discussed cross-titration of medications to minimize risk of discontinuation and possible side effects. Will decrease Lexapro to 20 mg 1/2 tablet daily for one week, then stop. Recommend continuing Alprazolam 0.5 mg po BID prn anxiety. Pt to follow-up with this provider in 6 weeks or sooner if clinically indicated. Referral made to Stevphen Meuse, [facility] for therapy. Patient advised to contact office with any questions, adverse effects, or acute worsening in signs and symptoms.   Corie Chiquito, PMHNP

## 2022-08-05 ENCOUNTER — Other Ambulatory Visit: Payer: Self-pay | Admitting: Internal Medicine

## 2022-08-05 ENCOUNTER — Other Ambulatory Visit (HOSPITAL_COMMUNITY): Payer: Self-pay

## 2022-08-05 MED ORDER — BENAZEPRIL-HYDROCHLOROTHIAZIDE 20-12.5 MG PO TABS
1.0000 | ORAL_TABLET | Freq: Every day | ORAL | 0 refills | Status: DC
  Filled 2022-08-05: qty 90, 90d supply, fill #0

## 2022-08-10 ENCOUNTER — Encounter (INDEPENDENT_AMBULATORY_CARE_PROVIDER_SITE_OTHER): Payer: Self-pay | Admitting: Family Medicine

## 2022-08-10 ENCOUNTER — Ambulatory Visit (INDEPENDENT_AMBULATORY_CARE_PROVIDER_SITE_OTHER): Payer: 59 | Admitting: Family Medicine

## 2022-08-10 ENCOUNTER — Other Ambulatory Visit (HOSPITAL_BASED_OUTPATIENT_CLINIC_OR_DEPARTMENT_OTHER): Payer: Self-pay

## 2022-08-10 VITALS — BP 105/72 | HR 94 | Temp 97.9°F | Ht 66.0 in | Wt 238.0 lb

## 2022-08-10 DIAGNOSIS — Z6838 Body mass index (BMI) 38.0-38.9, adult: Secondary | ICD-10-CM | POA: Diagnosis not present

## 2022-08-10 DIAGNOSIS — R632 Polyphagia: Secondary | ICD-10-CM | POA: Diagnosis not present

## 2022-08-10 DIAGNOSIS — F3289 Other specified depressive episodes: Secondary | ICD-10-CM | POA: Diagnosis not present

## 2022-08-10 DIAGNOSIS — E669 Obesity, unspecified: Secondary | ICD-10-CM

## 2022-08-10 MED ORDER — ZEPBOUND 10 MG/0.5ML ~~LOC~~ SOAJ
10.0000 mg | SUBCUTANEOUS | 0 refills | Status: DC
Start: 2022-08-10 — End: 2022-09-01
  Filled 2022-08-10: qty 2, 28d supply, fill #0

## 2022-08-11 ENCOUNTER — Other Ambulatory Visit (HOSPITAL_BASED_OUTPATIENT_CLINIC_OR_DEPARTMENT_OTHER): Payer: Self-pay

## 2022-08-11 NOTE — Progress Notes (Signed)
Chief Complaint:   OBESITY Lether is here to discuss her progress with her obesity treatment plan along with follow-up of her obesity related diagnoses. Halimah is on the Category 3 Plan and states she is following her eating plan approximately 97% of the time. Arilla states she is bike riding and on the treadmill for 25-45 minutes 5-7 times per week.  Today's visit was #: 26 Starting weight: 273 lbs Starting date: 08/27/2020 Today's weight: 238 lbs Today's date: 08/10/2022 Total lbs lost to date: 35 Total lbs lost since last in-office visit: 3  Interim History: Patient continues to do well with her weight loss.  She has increased her exercise and she is working on eating everything on her eating plan.  Her hunger is still a challenge.  Subjective:   1. Polyphagia Patient is working on her diet, but she is still struggling with polyphagia.  2. Emotional Eating Behavior Patient is struggling with some emotional eating behavior.  She is frustrated that her weight loss is not faster.  No side effects were noted on Topamax and Viibryd.   Assessment/Plan:   1. Polyphagia Patient agreed to increase Zepbound to 10 mg once weekly, and we will refill for 1 month. We will follow-up at her next visit in 1 month.  - tirzepatide (ZEPBOUND) 10 MG/0.5ML Pen; Inject 10 mg into the skin once a week.  Dispense: 2 mL; Refill: 0  2. Emotional Eating Behavior Patient will continue her medications, and emotional eating behavior strategies were discussed and unrealistic expectations were addressed.  We will follow-up at her next visit in 1 month.  3. BMI 38.0-38.9,adult  4. Obesity, Beginning BMI 45.43 Kaiesha is currently in the action stage of change. As such, her goal is to continue with weight loss efforts. She has agreed to the Category 3 Plan.   Exercise goals: As is.   Behavioral modification strategies: increasing lean protein intake.  Lashonna has agreed to follow-up with our  clinic in 4 weeks. She was informed of the importance of frequent follow-up visits to maximize her success with intensive lifestyle modifications for her multiple health conditions.   Objective:   Blood pressure 105/72, pulse 94, temperature 97.9 F (36.6 C), height 5\' 6"  (1.676 m), weight 238 lb (108 kg), SpO2 98%. Body mass index is 38.41 kg/m.  Lab Results  Component Value Date   CREATININE 0.88 11/03/2021   BUN 13 11/03/2021   NA 140 11/03/2021   K 4.8 11/03/2021   CL 104 11/03/2021   CO2 25 11/03/2021   Lab Results  Component Value Date   ALT 16 11/03/2021   AST 17 11/03/2021   ALKPHOS 82 05/04/2021   BILITOT 0.3 11/03/2021   Lab Results  Component Value Date   HGBA1C 5.6 11/03/2021   HGBA1C 5.5 05/04/2021   HGBA1C 5.9 (H) 08/27/2020   HGBA1C 5.5 06/19/2019   HGBA1C 5.6 04/10/2018   Lab Results  Component Value Date   INSULIN 22.3 05/04/2021   INSULIN 28.0 (H) 08/27/2020   Lab Results  Component Value Date   TSH 0.72 11/03/2021   Lab Results  Component Value Date   CHOL 157 11/03/2021   HDL 63 11/03/2021   LDLCALC 77 11/03/2021   TRIG 91 11/03/2021   CHOLHDL 2.5 11/03/2021   Lab Results  Component Value Date   VD25OH 65.5 05/04/2021   VD25OH 45.7 08/27/2020   VD25OH 32 06/19/2019   Lab Results  Component Value Date   WBC 6.3 11/03/2021  HGB 16.2 (H) 11/03/2021   HCT 47.9 (H) 11/03/2021   MCV 91.8 11/03/2021   PLT 395 11/03/2021   No results found for: "IRON", "TIBC", "FERRITIN"  Attestation Statements:   Reviewed by clinician on day of visit: allergies, medications, problem list, medical history, surgical history, family history, social history, and previous encounter notes.   I, Burt Knack, am acting as transcriptionist for Quillian Quince, MD.  I have reviewed the above documentation for accuracy and completeness, and I agree with the above. -  Quillian Quince, MD

## 2022-08-24 ENCOUNTER — Other Ambulatory Visit (HOSPITAL_COMMUNITY): Payer: Self-pay

## 2022-08-30 ENCOUNTER — Other Ambulatory Visit (HOSPITAL_COMMUNITY): Payer: Self-pay

## 2022-08-30 ENCOUNTER — Other Ambulatory Visit: Payer: Self-pay

## 2022-08-30 MED ORDER — NORETHIN ACE-ETH ESTRAD-FE 1.5-30 MG-MCG PO TABS
1.0000 | ORAL_TABLET | Freq: Every day | ORAL | 0 refills | Status: DC
  Filled 2022-08-30: qty 84, 84d supply, fill #0

## 2022-09-01 ENCOUNTER — Ambulatory Visit (INDEPENDENT_AMBULATORY_CARE_PROVIDER_SITE_OTHER): Payer: 59 | Admitting: Family Medicine

## 2022-09-01 ENCOUNTER — Encounter (INDEPENDENT_AMBULATORY_CARE_PROVIDER_SITE_OTHER): Payer: Self-pay | Admitting: Family Medicine

## 2022-09-01 ENCOUNTER — Other Ambulatory Visit (HOSPITAL_BASED_OUTPATIENT_CLINIC_OR_DEPARTMENT_OTHER): Payer: Self-pay

## 2022-09-01 VITALS — BP 103/70 | HR 93 | Temp 98.1°F | Ht 66.0 in | Wt 240.0 lb

## 2022-09-01 DIAGNOSIS — E669 Obesity, unspecified: Secondary | ICD-10-CM

## 2022-09-01 DIAGNOSIS — R632 Polyphagia: Secondary | ICD-10-CM

## 2022-09-01 DIAGNOSIS — F418 Other specified anxiety disorders: Secondary | ICD-10-CM

## 2022-09-01 DIAGNOSIS — Z6838 Body mass index (BMI) 38.0-38.9, adult: Secondary | ICD-10-CM | POA: Diagnosis not present

## 2022-09-01 MED ORDER — ZEPBOUND 10 MG/0.5ML ~~LOC~~ SOAJ
10.0000 mg | SUBCUTANEOUS | 0 refills | Status: DC
Start: 2022-09-01 — End: 2022-09-29
  Filled 2022-09-01 – 2022-09-04 (×2): qty 2, 28d supply, fill #0

## 2022-09-02 NOTE — Progress Notes (Signed)
Chief Complaint:   OBESITY Dana Washington is here to discuss her progress with her obesity treatment plan along with follow-up of her obesity related diagnoses. Dana Washington is on the Category 3 Plan and states she is following her eating plan approximately 90% of the time. Dana Washington states she is on the treadmill and bike for 30-60 minutes 5-7 times per week.  Today's visit was #: 27 Starting weight: 273 lbs Starting date: 08/27/2020 Today's weight: 240 lbs Today's date: 09/01/2022 Total lbs lost to date: 33 Total lbs lost since last in-office visit: 0  Interim History: Patient is frustrated with her weight loss.  She does not eat poorly and she has been avoiding eating out.  She is working on meeting her protein goals and not missing meals.  Subjective:   1. Depression with anxiety Patient is on Topamax, Lexapro, Viibryd, and Xanax (as needed) to help with her mood and also help decrease emotional eating behavior.  She is still emotional which is exacerbated by her work stressors.  2. Polyphagia Patient is stable on Zepbound.  She has taken 2 doses of the new dose.  Assessment/Plan:   1. Depression with anxiety Patient will continue her medications as is and will follow-up with her psychiatrist for additional medication adjustment.  2. Polyphagia Patient will continue Zepbound 10 mg once weekly, and we will refill for 1 month.  - tirzepatide (ZEPBOUND) 10 MG/0.5ML Pen; Inject 10 mg into the skin once a week.  Dispense: 2 mL; Refill: 0  3. BMI 38.0-38.9,adult  4. Obesity, Beginning BMI 45.43 Dana Washington is currently in the action stage of change. As such, her goal is to continue with weight loss efforts. She has agreed to the Category 3 Plan.   Exercise goals: As is.   Behavioral modification strategies: meal planning and cooking strategies.  Dana Washington has agreed to follow-up with our clinic in 4 weeks. She was informed of the importance of frequent follow-up visits to maximize her  success with intensive lifestyle modifications for her multiple health conditions.   Objective:   Blood pressure 103/70, pulse 93, temperature 98.1 F (36.7 C), height 5\' 6"  (1.676 m), weight 240 lb (108.9 kg), SpO2 98%. Body mass index is 38.74 kg/m.  Lab Results  Component Value Date   CREATININE 0.88 11/03/2021   BUN 13 11/03/2021   NA 140 11/03/2021   K 4.8 11/03/2021   CL 104 11/03/2021   CO2 25 11/03/2021   Lab Results  Component Value Date   ALT 16 11/03/2021   AST 17 11/03/2021   ALKPHOS 82 05/04/2021   BILITOT 0.3 11/03/2021   Lab Results  Component Value Date   HGBA1C 5.6 11/03/2021   HGBA1C 5.5 05/04/2021   HGBA1C 5.9 (H) 08/27/2020   HGBA1C 5.5 06/19/2019   HGBA1C 5.6 04/10/2018   Lab Results  Component Value Date   INSULIN 22.3 05/04/2021   INSULIN 28.0 (H) 08/27/2020   Lab Results  Component Value Date   TSH 0.72 11/03/2021   Lab Results  Component Value Date   CHOL 157 11/03/2021   HDL 63 11/03/2021   LDLCALC 77 11/03/2021   TRIG 91 11/03/2021   CHOLHDL 2.5 11/03/2021   Lab Results  Component Value Date   VD25OH 65.5 05/04/2021   VD25OH 45.7 08/27/2020   VD25OH 32 06/19/2019   Lab Results  Component Value Date   WBC 6.3 11/03/2021   HGB 16.2 (H) 11/03/2021   HCT 47.9 (H) 11/03/2021   MCV 91.8 11/03/2021  PLT 395 11/03/2021   No results found for: "IRON", "TIBC", "FERRITIN"  Attestation Statements:   Reviewed by clinician on day of visit: allergies, medications, problem list, medical history, surgical history, family history, social history, and previous encounter notes.  Time spent on visit including pre-visit chart review and post-visit care and charting was 40 minutes.   I, Burt Knack, am acting as Energy manager for Quillian Quince, MD.  I have reviewed the above documentation for accuracy and completeness, and I agree with the above. -  Quillian Quince, MD

## 2022-09-04 ENCOUNTER — Other Ambulatory Visit (HOSPITAL_BASED_OUTPATIENT_CLINIC_OR_DEPARTMENT_OTHER): Payer: Self-pay

## 2022-09-10 ENCOUNTER — Ambulatory Visit (INDEPENDENT_AMBULATORY_CARE_PROVIDER_SITE_OTHER): Payer: 59 | Admitting: Psychiatry

## 2022-09-10 ENCOUNTER — Other Ambulatory Visit (HOSPITAL_COMMUNITY): Payer: Self-pay

## 2022-09-10 ENCOUNTER — Encounter: Payer: Self-pay | Admitting: Psychiatry

## 2022-09-10 DIAGNOSIS — F4010 Social phobia, unspecified: Secondary | ICD-10-CM | POA: Diagnosis not present

## 2022-09-10 DIAGNOSIS — F411 Generalized anxiety disorder: Secondary | ICD-10-CM | POA: Diagnosis not present

## 2022-09-10 MED ORDER — VILAZODONE HCL 20 MG PO TABS
20.0000 mg | ORAL_TABLET | Freq: Every day | ORAL | 2 refills | Status: DC
  Filled 2022-09-10: qty 30, 30d supply, fill #0

## 2022-09-10 NOTE — Progress Notes (Signed)
Syan Clack [employer] [id number] 1975/10/18 47 y.o.  Subjective:   Patient ID:  Dana Washington is a 47 y.o. (DOB Sep 13, 1975) female.  Chief Complaint:  Chief Complaint  Patient presents with   Anxiety    HPI Moira Diguglielmo Gioffre presents to the office today for follow-up of anxiety. She reports, "I don't think the medicine is working." She denies any significant change in anxiety symptoms with transition from Lexapro to Viibryd 10 mg daily. She reports that she continues to have crying episodes. Reports crying multiple times daily. She reports that she is crying less during meetings.   She reports that she has had a stressful week. She had a situation at work where she had some catastrophic thoughts. She reports that she has not slept well this week as a result of work situation. Continues to have frequent worry and rumination. She has experienced burning/pain in her chest, sweating, and stomach pain this week in response to stressful event. She continues to replay discussions. Continued social anxiety.   Denies depressed mood. Energy and motivation have been good. She has been using her treadmill consistently this week. Appetite has been lower with increased dose of Zepbound. Concentration has been ok. Reports making some mistakes this week due to stress, multiple interruptions, and insomnia. Denies SI.    Past Psychiatric Medication Trials: Lexapro- Started in Feb 2024. She feels that this has been helpful for tearfulness. Seemed to be initially more effect. Prozac- Ineffective.  Paxil- Weight gain Sertraline Cymbalta Xanax- Rarely takes.  Topamax- prescribed for headaches   Reports being very sensitive to medications.  PHQ2-9    Flowsheet Row Office Visit from 03/04/2022 in [facility] Sal[facility], MD Office Visit from 12/17/2021 in Sharlet Sal[facility], MD Office Visit from 11/06/2021 in [facility], MD Office Visit from 08/27/2020 in [facility] at  [facility] Visit from 07/22/2020 in Sharlet Salina, MD  PHQ-2 Total Score 0 0 0 1 0  PHQ-9 Total Score -- -- -- 3 --      Flowsheet Row ED from 02/08/2021 in [facility] Department at [facility] ED from 09/04/2020 in [facility] Emergency Department at [facility]  C-SSRS RISK CATEGORY No Risk No Risk        Review of Systems:  Review of Systems  Gastrointestinal: Negative.   Musculoskeletal:  Negative for gait problem.  Psychiatric/Behavioral:         Please refer to HPI    Medications: I have reviewed the patient's current medications.  Current Outpatient Medications  Medication Sig Dispense Refill   benazepril-hydrochlorthiazide (LOTENSIN HCT) 20-12.5 MG tablet Take 1 tablet by mouth daily. 90 tablet 0   cholecalciferol (VITAMIN D3) 25 MCG (1000 UNIT) tablet Take 1,000 Units by mouth daily.     norethindrone-ethinyl estradiol-iron (BLISOVI FE 1.5/30) 1.5-30 MG-MCG tablet Take 1 tablet by mouth daily. 84 tablet 0   omeprazole (PRILOSEC) 20 MG capsule Take 1 capsule (20 mg total) by mouth daily. 90 capsule 3   polyethylene glycol powder (GLYCOLAX/MIRALAX) 17 GM/SCOOP powder Take 17 g by mouth 2 (two) times daily as needed. 3350 g 1   Rimegepant Sulfate (NURTEC) 75 MG TBDP Take 75 mg by mouth daily as needed. For migraines. Take as close to onset of migraine as possible. One daily maximum. 6 tablet 0   rosuvastatin (CRESTOR) 20 MG tablet Take 1 tablet (20 mg total) by mouth daily. 90 tablet 3   SYNTHROID 100 MCG tablet Take 1  tablet (100 mcg total) by mouth daily. 90 tablet 1   tirzepatide (ZEPBOUND) 10 MG/0.5ML Pen Inject 10 mg into the skin once a week. 2 mL 0   topiramate (TOPAMAX) 100 MG tablet Take 1 tablet (100 mg total) by mouth at bedtime. 90 tablet 2   Vilazodone HCl 20 MG TABS Take 1 tablet (20 mg total) by mouth daily with breakfast. 30 tablet 2   ALPRAZolam (XANAX) 0.5 MG tablet Take 1 tablet (0.5 mg total) by mouth 2 (two) times daily as  needed for anxiety. 60 tablet 1   HYDROcodone-acetaminophen (NORCO/VICODIN) 5-325 MG tablet Take 1 tablet by mouth every 6 (six) hours as needed for moderate pain. (Patient not taking: Reported on 09/10/2022) 30 tablet 0   ondansetron (ZOFRAN) 4 MG tablet Take 1 tablet (4 mg total) by mouth every 8 (eight) hours as needed for nausea or vomiting. (Patient not taking: Reported on 09/10/2022) 20 tablet 1   No current facility-administered medications for this visit.    Medication Side Effects: None  Allergies:  Allergies  Allergen Reactions   Bee Venom Hives    Past Medical History:  Diagnosis Date   Allergy    Anxiety    Colon polyps    Constipation    Diabetes mellitus without complication (HCC)    Fundic gland polyps of stomach, benign    GERD (gastroesophageal reflux disease)    Glucose intolerance (impaired glucose tolerance)    Herpes dermatitis    History of motion sickness    Hx of Hashimoto thyroiditis    Hx of sessile serrated colonic polyps 03/04/2021   Hyperlipidemia    Hypertension    Hypothyroidism, secondary    Impingement syndrome of right shoulder    Insomnia    Metabolic syndrome    Migraine    OA (osteoarthritis)    RIGHT SHOULDER AC JOINT   Obese    OCD (obsessive compulsive disorder)    PONV (postoperative nausea and vomiting)    SEVERE after breast reduction   TMJ (dislocation of temporomandibular joint)    Vitamin D deficiency    Vitamin D deficiency     Past Medical History, Surgical history, Social history, and Family history were reviewed and updated as appropriate.   Please see review of systems for further details on the patient's review from today.   Objective:   Physical Exam:  There were no vitals taken for this visit.  Physical Exam Constitutional:      General: She is not in acute distress. Musculoskeletal:        General: No deformity.  Neurological:     Mental Status: She is alert and oriented to person, place, and time.      Coordination: Coordination normal.  Psychiatric:        Attention and Perception: Attention and perception normal. She does not perceive auditory or visual hallucinations.        Mood and Affect: Mood is anxious. Mood is not depressed. Affect is not labile, blunt, angry or inappropriate.        Speech: Speech normal.        Behavior: Behavior normal.        Thought Content: Thought content normal. Thought content is not paranoid or delusional. Thought content does not include homicidal or suicidal ideation. Thought content does not include homicidal or suicidal plan.        Cognition and Memory: Cognition and memory normal.        Judgment: Judgment normal.  Comments: Insight intact     Lab Review:     Component Value Date/Time   NA 140 11/03/2021 0917   NA 141 05/04/2021 0742   K 4.8 11/03/2021 0917   CL 104 11/03/2021 0917   CO2 25 11/03/2021 0917   GLUCOSE 95 11/03/2021 0917   BUN 13 11/03/2021 0917   BUN 17 05/04/2021 0742   CREATININE 0.88 11/03/2021 0917   CALCIUM 9.8 11/03/2021 0917   PROT 7.6 11/03/2021 0917   PROT 7.0 05/04/2021 0742   ALBUMIN 4.4 05/04/2021 0742   AST 17 11/03/2021 0917   ALT 16 11/03/2021 0917   ALKPHOS 82 05/04/2021 0742   BILITOT 0.3 11/03/2021 0917   BILITOT 0.3 05/04/2021 0742   GFRNONAA >60 02/08/2021 1244   GFRNONAA 85 07/18/2020 0937   GFRAA 98 07/18/2020 0937       Component Value Date/Time   WBC 6.3 11/03/2021 0917   RBC 5.22 (H) 11/03/2021 0917   HGB 16.2 (H) 11/03/2021 0917   HGB 15.6 05/04/2021 0742   HCT 47.9 (H) 11/03/2021 0917   HCT 47.5 (H) 05/04/2021 0742   PLT 395 11/03/2021 0917   PLT 325 05/04/2021 0742   MCV 91.8 11/03/2021 0917   MCV 93 05/04/2021 0742   MCH 31.0 11/03/2021 0917   MCHC 33.8 11/03/2021 0917   RDW 13.0 11/03/2021 0917   RDW 13.9 05/04/2021 0742   LYMPHSABS 1,323 11/03/2021 0917   LYMPHSABS 1.7 05/04/2021 0742   MONOABS 0.7 09/04/2020 0144   EOSABS 38 11/03/2021 0917   EOSABS 0.2 05/04/2021  0742   BASOSABS 69 11/03/2021 0917   BASOSABS 0.1 05/04/2021 0742    No results found for: "POCLITH", "LITHIUM"   No results found for: "PHENYTOIN", "PHENOBARB", "VALPROATE", "CBMZ"   .res Assessment: Plan:    35 minutes spent dedicated to the care of this patient on the date of this encounter to include pre-visit review of records, ordering of medication, post visit documentation, and face-to-face time with the patient discussing response to Viibryd and the effect recent work stressors have had on her anxiety. Discussed potential benefits, risks, and side effects of increasing the dose of Viibryd to 20 mg daily. Pt is in agreement with increase in Viibryd.  Encouraged pt to consider using Alprazolam prn for acute anxiety and stress. Pt to follow-up with this provider in 4 weeks or sooner if clinically indicated.  Patient advised to contact office with any questions, adverse effects, or acute worsening in signs and symptoms.  Josalin was seen today for anxiety.  Diagnoses and all orders for this visit:  Generalized anxiety disorder -     Vilazodone HCl 20 MG TABS; Take 1 tablet (20 mg total) by mouth daily with breakfast.  Social anxiety disorder -     Vilazodone HCl 20 MG TABS; Take 1 tablet (20 mg total) by mouth daily with breakfast.     Please see After Visit Summary for patient specific instructions.  Future Appointments  Date Time Provider Department Center  09/29/2022  4:00 PM Wilder Glade, MD MWM-MWM None  10/07/2022  1:00 PM Corie Chiquito, PMHNP CP-CP None  10/27/2022  4:20 PM Quillian Quince D, MD MWM-M[facility] None  12/29/2022  1:00 PM Anson Fret, MD GNA-[facility]NA None  01/18/2023  9:20 AM MJB-LAB MJB-MJB MJB  01/20/2023  3:00 PM Baxley, Luanna Cole, MD MJB-MJB MJB    No orders of the defined types were placed in this encounter.   -------------------------------

## 2022-09-11 ENCOUNTER — Other Ambulatory Visit (HOSPITAL_COMMUNITY): Payer: Self-pay

## 2022-09-11 ENCOUNTER — Other Ambulatory Visit (HOSPITAL_BASED_OUTPATIENT_CLINIC_OR_DEPARTMENT_OTHER): Payer: Self-pay

## 2022-09-23 ENCOUNTER — Other Ambulatory Visit: Payer: Self-pay

## 2022-09-28 ENCOUNTER — Other Ambulatory Visit (HOSPITAL_COMMUNITY): Payer: Self-pay

## 2022-09-29 ENCOUNTER — Other Ambulatory Visit (HOSPITAL_BASED_OUTPATIENT_CLINIC_OR_DEPARTMENT_OTHER): Payer: Self-pay

## 2022-09-29 ENCOUNTER — Ambulatory Visit (INDEPENDENT_AMBULATORY_CARE_PROVIDER_SITE_OTHER): Payer: 59 | Admitting: Family Medicine

## 2022-09-29 ENCOUNTER — Encounter (INDEPENDENT_AMBULATORY_CARE_PROVIDER_SITE_OTHER): Payer: Self-pay | Admitting: Family Medicine

## 2022-09-29 VITALS — BP 112/82 | HR 116 | Temp 98.2°F | Ht 66.0 in | Wt 230.0 lb

## 2022-09-29 DIAGNOSIS — F3289 Other specified depressive episodes: Secondary | ICD-10-CM

## 2022-09-29 DIAGNOSIS — G4700 Insomnia, unspecified: Secondary | ICD-10-CM | POA: Diagnosis not present

## 2022-09-29 DIAGNOSIS — E669 Obesity, unspecified: Secondary | ICD-10-CM

## 2022-09-29 DIAGNOSIS — F32A Depression, unspecified: Secondary | ICD-10-CM | POA: Diagnosis not present

## 2022-09-29 DIAGNOSIS — G4709 Other insomnia: Secondary | ICD-10-CM

## 2022-09-29 DIAGNOSIS — Z6837 Body mass index (BMI) 37.0-37.9, adult: Secondary | ICD-10-CM

## 2022-09-29 DIAGNOSIS — R632 Polyphagia: Secondary | ICD-10-CM | POA: Diagnosis not present

## 2022-09-29 MED ORDER — ZEPBOUND 10 MG/0.5ML ~~LOC~~ SOAJ
10.0000 mg | SUBCUTANEOUS | 0 refills | Status: DC
Start: 2022-09-29 — End: 2022-10-27
  Filled 2022-09-29: qty 2, 28d supply, fill #0

## 2022-09-30 DIAGNOSIS — Z1389 Encounter for screening for other disorder: Secondary | ICD-10-CM | POA: Diagnosis not present

## 2022-09-30 DIAGNOSIS — Z130 Encounter for screening for diseases of the blood and blood-forming organs and certain disorders involving the immune mechanism: Secondary | ICD-10-CM | POA: Diagnosis not present

## 2022-09-30 DIAGNOSIS — Z01419 Encounter for gynecological examination (general) (routine) without abnormal findings: Secondary | ICD-10-CM | POA: Diagnosis not present

## 2022-09-30 DIAGNOSIS — Z86010 Personal history of colonic polyps: Secondary | ICD-10-CM | POA: Diagnosis not present

## 2022-09-30 NOTE — Progress Notes (Signed)
Chief Complaint:   OBESITY Dana Washington is here to discuss her progress with her obesity treatment plan along with follow-up of her obesity related diagnoses. Dana Washington is on the Category 3 Plan and states she is following her eating plan approximately 85% of the time. Dana Washington states she is doing 0 minutes 0 times per week.  Today's visit was #: 28 Starting weight: 273 lbs Starting date: 08/27/2020 Today's weight: 230 lbs Today's date: 09/29/2022 Total lbs lost to date: 43 Total lbs lost since last in-office visit: 10  Interim History: Patient is doing well with her weight loss, but she has not been eating as much due to increased work stress.  Subjective:   1. Insomnia Patient is not sleeping well and she is very stressed at work, and she is struggling to fall asleep.  2. Polyphagia Patient notes polyphagia has improved on Zepbound.  She is doing well with her diet.  She denies nausea, vomiting, or abdominal pain.  3. Depression Patient's Viibryd increased 2 weeks ago, and she is not certain that it is helping.  She has situational depression and her work situation does not appear to be improving soon.  Assessment/Plan:   1. Insomnia Patient is to start OTC melatonin and magnesium.  We will follow-up at her next visit in 1 month.  2. Polyphagia Patient will continue Zepbound 10 mg once weekly, and we will refill for 1 month.  - tirzepatide (ZEPBOUND) 10 MG/0.5ML Pen; Inject 10 mg into the skin once a week.  Dispense: 2 mL; Refill: 0  3. Depression Patient is to work on self-care and continue Viibryd.  We will follow-up in 4 weeks to see how she is doing.  4. BMI 37.0-37.9, adult  5. Obesity, Beginning BMI 45.43 Dana Washington is currently in the action stage of change. As such, her goal is to continue with weight loss efforts. She has agreed to the Category 3 Plan.   Behavioral modification strategies: increasing lean protein intake and emotional eating strategies.  Dana Washington has  agreed to follow-up with our clinic in 4 weeks. She was informed of the importance of frequent follow-up visits to maximize her success with intensive lifestyle modifications for her multiple health conditions.   Objective:   Blood pressure 112/82, pulse (!) 116, temperature 98.2 F (36.8 C), height 5\' 6"  (1.676 m), weight 230 lb (104.3 kg), SpO2 97%. Body mass index is 37.12 kg/m.  Lab Results  Component Value Date   CREATININE 0.88 11/03/2021   BUN 13 11/03/2021   NA 140 11/03/2021   K 4.8 11/03/2021   CL 104 11/03/2021   CO2 25 11/03/2021   Lab Results  Component Value Date   ALT 16 11/03/2021   AST 17 11/03/2021   ALKPHOS 82 05/04/2021   BILITOT 0.3 11/03/2021   Lab Results  Component Value Date   HGBA1C 5.6 11/03/2021   HGBA1C 5.5 05/04/2021   HGBA1C 5.9 (H) 08/27/2020   HGBA1C 5.5 06/19/2019   HGBA1C 5.6 04/10/2018   Lab Results  Component Value Date   INSULIN 22.3 05/04/2021   INSULIN 28.0 (H) 08/27/2020   Lab Results  Component Value Date   TSH 0.72 11/03/2021   Lab Results  Component Value Date   CHOL 157 11/03/2021   HDL 63 11/03/2021   LDLCALC 77 11/03/2021   TRIG 91 11/03/2021   CHOLHDL 2.5 11/03/2021   Lab Results  Component Value Date   VD25OH 65.5 05/04/2021   VD25OH 45.7 08/27/2020   VD25OH 32  06/19/2019   Lab Results  Component Value Date   WBC 6.3 11/03/2021   HGB 16.2 (H) 11/03/2021   HCT 47.9 (H) 11/03/2021   MCV 91.8 11/03/2021   PLT 395 11/03/2021   No results found for: "IRON", "TIBC", "FERRITIN"  Attestation Statements:   Reviewed by clinician on day of visit: allergies, medications, problem list, medical history, surgical history, family history, social history, and previous encounter notes.   I, Burt Knack, am acting as transcriptionist for Quillian Quince, MD.  I have reviewed the above documentation for accuracy and completeness, and I agree with the above. -  Quillian Quince, MD

## 2022-10-07 ENCOUNTER — Other Ambulatory Visit: Payer: Self-pay

## 2022-10-07 ENCOUNTER — Encounter: Payer: Self-pay | Admitting: Psychiatry

## 2022-10-07 ENCOUNTER — Ambulatory Visit (INDEPENDENT_AMBULATORY_CARE_PROVIDER_SITE_OTHER): Payer: 59 | Admitting: Psychiatry

## 2022-10-07 DIAGNOSIS — F4010 Social phobia, unspecified: Secondary | ICD-10-CM | POA: Diagnosis not present

## 2022-10-07 DIAGNOSIS — F411 Generalized anxiety disorder: Secondary | ICD-10-CM

## 2022-10-07 MED ORDER — VILAZODONE HCL 20 MG PO TABS
20.0000 mg | ORAL_TABLET | Freq: Every day | ORAL | 0 refills | Status: DC
  Filled 2022-10-07: qty 90, 90d supply, fill #0

## 2022-10-07 NOTE — Progress Notes (Signed)
[name] [facility] [id number] 1975-11-14 47 y.o.  Subjective:   Patient ID:  Dana Washington is a 47 y.o. (DOB 1975-12-31) female.  Chief Complaint:  Chief Complaint  Patient presents with   Follow-up    Anxiety    HPI [name] presents to the office today for follow-up of anxiety. She reports that "Tuesday was one of the worst days of my life" and had severe anxiety (catastrophic thoughts, rumination) and uncontrolled crying. She reports that she has felt "much better" over the last 2 days. She reports, "I woke up Wednesday and I felt better." She reports that she cried once on Wednesday in response to receiving validation about a stressor. She reports that her anxiety has been less today to include some stressful. She denies any current depression. Energy and motivation have been ok. She reports that her appetite has been less with Zepbound and she has lost 10 lbs. She reports that she is eating healthy. Sleeping 7-8 hours a night. Reports improved sleep since starting Magnesium and having less rumination. Concentration is ok when not distracted. Denies SI.   Past Psychiatric Medication Trials: Lexapro- Started in Feb 2024. She feels that this has been helpful for tearfulness. Seemed to be initially more effect. Prozac- Ineffective.  Paxil- Weight gain Sertraline Cymbalta Xanax- Rarely takes.  Topamax- prescribed for headaches   Reports being very sensitive to medications.    PHQ2-9    Flowsheet Row Office Visit from 03/04/2022 in [facility] Sal[facility], MD Office Visit from 12/17/2021 in Sharlet Salina, MD Office Visit from 11/06/2021 in Sharlet Salina, MD Office Visit from 08/27/2020 in [facility] Wellness at [facility] Visit from 07/22/2020 in Sharlet Salina, MD  PHQ-2 Total Score 0 0 0 1 0  PHQ-9 Total Score -- -- -- 3 --      Flowsheet Row ED from 02/08/2021 in [facility] Emergency Department at [facility] ED from 09/04/2020  in [facility] Emergency Department at [facility]  C-SSRS RISK CATEGORY No Risk No Risk        Review of Systems:  Review of Systems  Gastrointestinal:  Negative for diarrhea.  Musculoskeletal:  Negative for gait problem.  Neurological:  Negative for tremors.  Psychiatric/Behavioral:         Please refer to HPI    Medications: I have reviewed the patient's current medications.  Current Outpatient Medications  Medication Sig Dispense Refill   ALPRAZolam (XANAX) 0.5 MG tablet Take 1 tablet (0.5 mg total) by mouth 2 (two) times daily as needed for anxiety. 60 tablet 1   benazepril-hydrochlorthiazide (LOTENSIN HCT) 20-12.5 MG tablet Take 1 tablet by mouth daily. 90 tablet 0   cholecalciferol (VITAMIN D3) 25 MCG (1000 UNIT) tablet Take 1,000 Units by mouth daily.     HYDROcodone-acetaminophen (NORCO/VICODIN) 5-325 MG tablet Take 1 tablet by mouth every 6 (six) hours as needed for moderate pain. (Patient not taking: Reported on 09/10/2022) 30 tablet 0   norethindrone-ethinyl estradiol-iron (BLISOVI FE 1.5/30) 1.5-30 MG-MCG tablet Take 1 tablet by mouth daily. 84 tablet 0   omeprazole (PRILOSEC) 20 MG capsule Take 1 capsule (20 mg total) by mouth daily. 90 capsule 3   polyethylene glycol powder (GLYCOLAX/MIRALAX) 17 GM/SCOOP powder Take 17 g by mouth 2 (two) times daily as needed. 3350 g 1   Rimegepant Sulfate (NURTEC) 75 MG TBDP Take 75 mg by mouth daily as needed. For migraines. Take as close to onset of migraine as possible. One  daily maximum. 6 tablet 0   rosuvastatin (CRESTOR) 20 MG tablet Take 1 tablet (20 mg total) by mouth daily. 90 tablet 3   SYNTHROID 100 MCG tablet Take 1 tablet (100 mcg total) by mouth daily. 90 tablet 1   tirzepatide (ZEPBOUND) 10 MG/0.5ML Pen Inject 10 mg into the skin once a week. 2 mL 0   topiramate (TOPAMAX) 100 MG tablet Take 1 tablet (100 mg total) by mouth at bedtime. 90 tablet 2   Vilazodone HCl 20 MG TABS Take 1 tablet (20 mg total) by mouth daily  with breakfast. 90 tablet 0   No current facility-administered medications for this visit.    Medication Side Effects: None  Allergies:  Allergies  Allergen Reactions   Bee Venom Hives    Past Medical History:  Diagnosis Date   Allergy    Anxiety    Colon polyps    Constipation    Diabetes mellitus without complication (HCC)    Fundic gland polyps of stomach, benign    GERD (gastroesophageal reflux disease)    Glucose intolerance (impaired glucose tolerance)    Herpes dermatitis    History of motion sickness    Hx of Hashimoto thyroiditis    Hx of sessile serrated colonic polyps 03/04/2021   Hyperlipidemia    Hypertension    Hypothyroidism, secondary    Impingement syndrome of right shoulder    Insomnia    Metabolic syndrome    Migraine    OA (osteoarthritis)    RIGHT SHOULDER AC JOINT   Obese    OCD (obsessive compulsive disorder)    PONV (postoperative nausea and vomiting)    SEVERE after breast reduction   TMJ (dislocation of temporomandibular joint)    Vitamin D deficiency    Vitamin D deficiency     Past Medical History, Surgical history, Social history, and Family history were reviewed and updated as appropriate.   Please see review of systems for further details on the patient's review from today.   Objective:   Physical Exam:  There were no vitals taken for this visit.  Physical Exam Constitutional:      General: She is not in acute distress. Musculoskeletal:        General: No deformity.  Neurological:     Mental Status: She is alert and oriented to person, place, and time.     Coordination: Coordination normal.  Psychiatric:        Attention and Perception: Attention and perception normal. She does not perceive auditory or visual hallucinations.        Mood and Affect: Mood is not depressed. Affect is not labile, blunt, angry or inappropriate.        Speech: Speech normal.        Behavior: Behavior normal.        Thought Content: Thought  content normal. Thought content is not paranoid or delusional. Thought content does not include homicidal or suicidal ideation. Thought content does not include homicidal or suicidal plan.        Cognition and Memory: Cognition and memory normal.        Judgment: Judgment normal.     Comments: Insight intact Mood presents as less anxious.      Lab Review:     Component Value Date/Time   NA 140 11/03/2021 0917   NA 141 05/04/2021 0742   K 4.8 11/03/2021 0917   CL 104 11/03/2021 0917   CO2 25 11/03/2021 0917   GLUCOSE 95 11/03/2021 0917  BUN 13 11/03/2021 0917   BUN 17 05/04/2021 0742   CREATININE 0.88 11/03/2021 0917   CALCIUM 9.8 11/03/2021 0917   PROT 7.6 11/03/2021 0917   PROT 7.0 05/04/2021 0742   ALBUMIN 4.4 05/04/2021 0742   AST 17 11/03/2021 0917   ALT 16 11/03/2021 0917   ALKPHOS 82 05/04/2021 0742   BILITOT 0.3 11/03/2021 0917   BILITOT 0.3 05/04/2021 0742   GFRNONAA >60 02/08/2021 1244   GFRNONAA 85 07/18/2020 0937   GFRAA 98 07/18/2020 0937       Component Value Date/Time   WBC 6.3 11/03/2021 0917   RBC 5.22 (H) 11/03/2021 0917   HGB 16.2 (H) 11/03/2021 0917   HGB 15.6 05/04/2021 0742   HCT 47.9 (H) 11/03/2021 0917   HCT 47.5 (H) 05/04/2021 0742   PLT 395 11/03/2021 0917   PLT 325 05/04/2021 0742   MCV 91.8 11/03/2021 0917   MCV 93 05/04/2021 0742   MCH 31.0 11/03/2021 0917   MCHC 33.8 11/03/2021 0917   RDW 13.0 11/03/2021 0917   RDW 13.9 05/04/2021 0742   LYMPHSABS 1,323 11/03/2021 0917   LYMPHSABS 1.7 05/04/2021 0742   MONOABS 0.7 09/04/2020 0144   EOSABS 38 11/03/2021 0917   EOSABS 0.2 05/04/2021 0742   BASOSABS 69 11/03/2021 0917   BASOSABS 0.1 05/04/2021 0742    No results found for: "POCLITH", "LITHIUM"   No results found for: "PHENYTOIN", "PHENOBARB", "VALPROATE", "CBMZ"   .res Assessment: Plan:    35 minutes spent dedicated to the care of this patient on the date of this encounter to include pre-visit review of records, ordering of  medication, post visit documentation, and face-to-face time with the patient discussing recent improvement in anxiety. Discussed that this may be due to increase in Viibryd since dose was increased to 20 mg daily almost 4 weeks ago. Discussed continuing Viibryd 20 mg daily for anxiety and continuing to monitor response to increased dose.  Recommend continuing Magnesium supplement since she reports that this has been helpful for her insomnia.  Pt to follow-up with this provider in 4 weeks or sooner if clinically indicated.  Patient advised to contact office with any questions, adverse effects, or acute worsening in signs and symptoms.   Kelsay was seen today for follow-up.  Diagnoses and all orders for this visit:  Generalized anxiety disorder -     Vilazodone HCl 20 MG TABS; Take 1 tablet (20 mg total) by mouth daily with breakfast.  Social anxiety disorder -     Vilazodone HCl 20 MG TABS; Take 1 tablet (20 mg total) by mouth daily with breakfast.     Please see After Visit Summary for patient specific instructions.  Future Appointments  Date Time Provider Department Center  10/27/2022  4:20 PM Wilder Glade, MD [facility] None  11/01/2022  3:00 PM Corie Chiquito, PMHNP CP-CP None  11/24/2022  4:00 PM Quillian Quince D, MD MWM-MWM None  12/29/2022  1:00 PM Anson Fret, MD G[facility]-GNA None  01/13/2023  1:00 PM Stevphen Meuse, [facility] CP-CP None  01/18/2023  9:20 AM MJB-LAB MJB-MJB MJB  01/20/2023  3:00 PM Baxley, Luanna Cole, MD MJB-MJB MJB    No orders of the defined types were placed in this encounter.   -------------------------------

## 2022-10-19 ENCOUNTER — Other Ambulatory Visit (HOSPITAL_BASED_OUTPATIENT_CLINIC_OR_DEPARTMENT_OTHER): Payer: Self-pay

## 2022-10-19 MED ORDER — COMIRNATY 30 MCG/0.3ML IM SUSY
0.3000 mL | PREFILLED_SYRINGE | Freq: Once | INTRAMUSCULAR | 0 refills | Status: AC
  Filled 2022-10-19: qty 0.3, 1d supply, fill #0

## 2022-10-25 IMAGING — CT CT ABD-PELV W/ CM
2 of 5 series · 16 of 46 positions shown, 18 images · IV contrast (APPLIED)
Comparison: 09/28/2018

CLINICAL DATA: Left lower quadrant abdominal pain, diarrhea

EXAM:
CT ABDOMEN AND PELVIS WITH CONTRAST
TECHNIQUE: Multidetector CT imaging of the abdomen and pelvis was performed
using the standard protocol following bolus administration of
intravenous contrast.

[Series 2: abd pel w · axial · 0.96mm/px · z∈[-319,+86]mm · 13 of 91 slices shown, 15 images]
[im 5/91  soft-tissue]
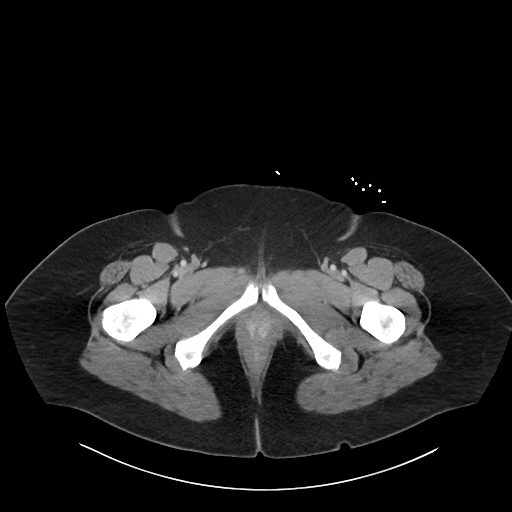
[im 5/91  bone]
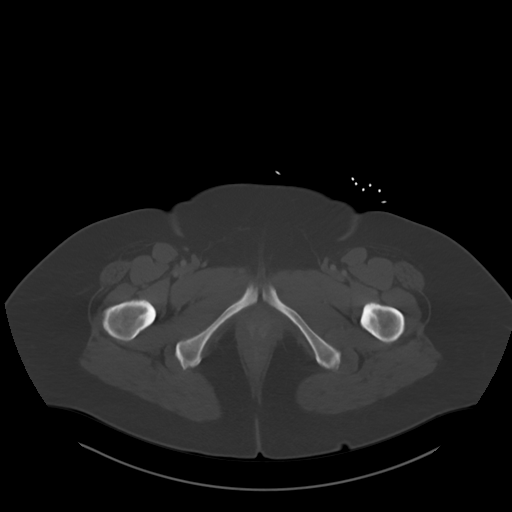
[im 15/91  soft-tissue]
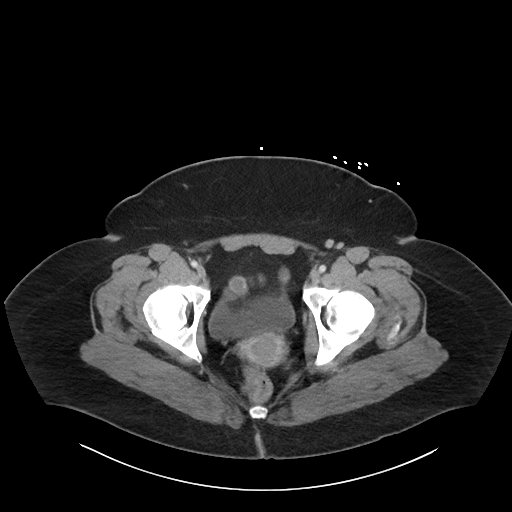
[im 19/91  soft-tissue]
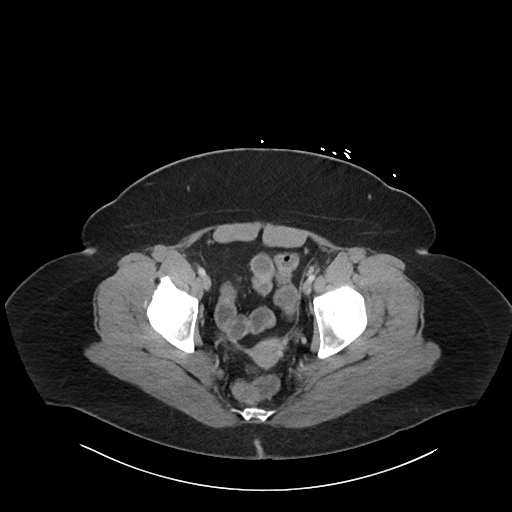
[im 24/91  soft-tissue]
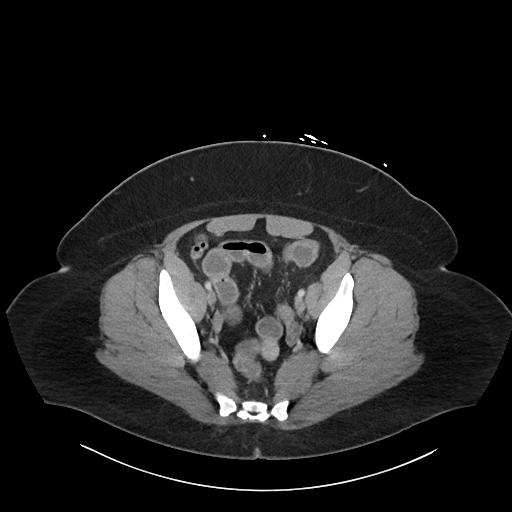
[im 34/91  soft-tissue]
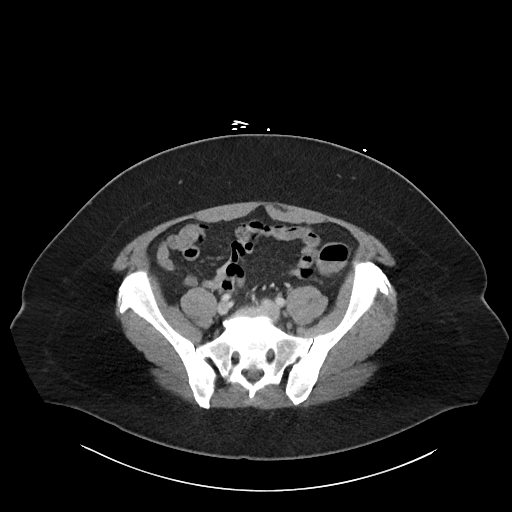
[im 38/91  soft-tissue]
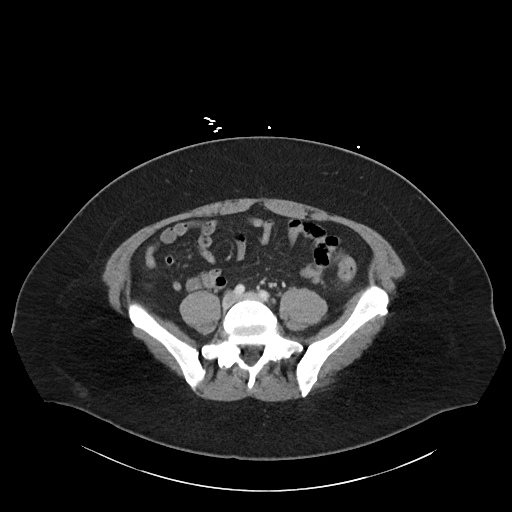
[im 48/91  soft-tissue]
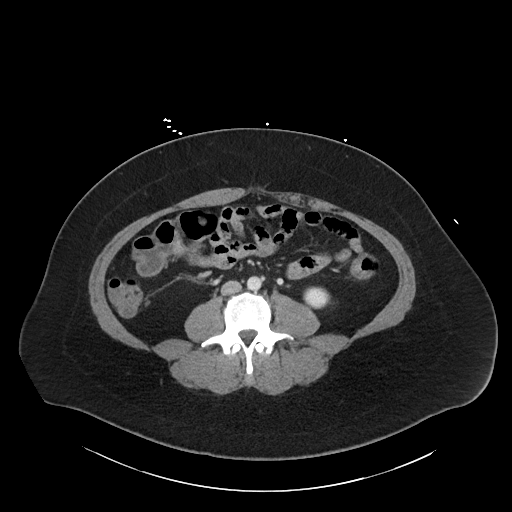
[im 53/91  soft-tissue]
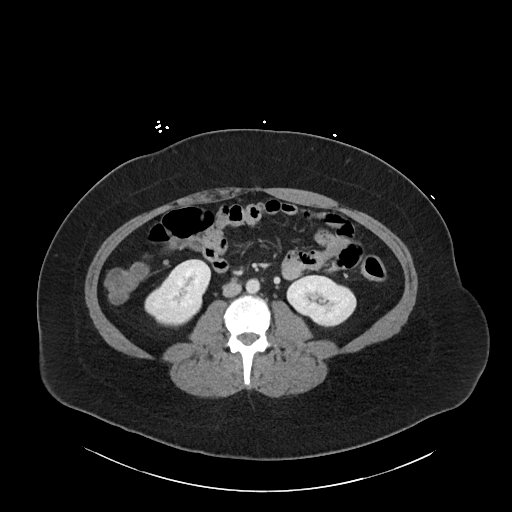
[im 57/91  soft-tissue]
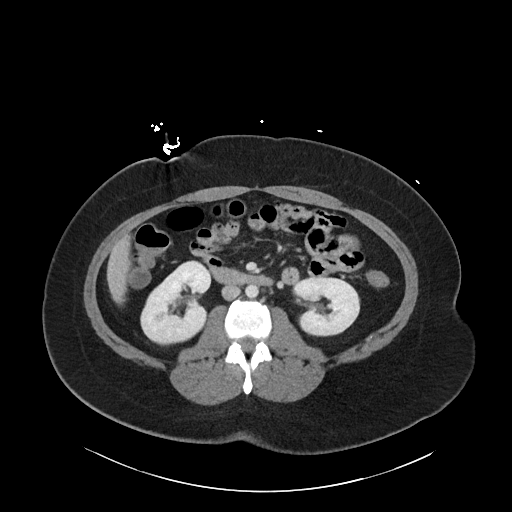
[im 57/91  bone]
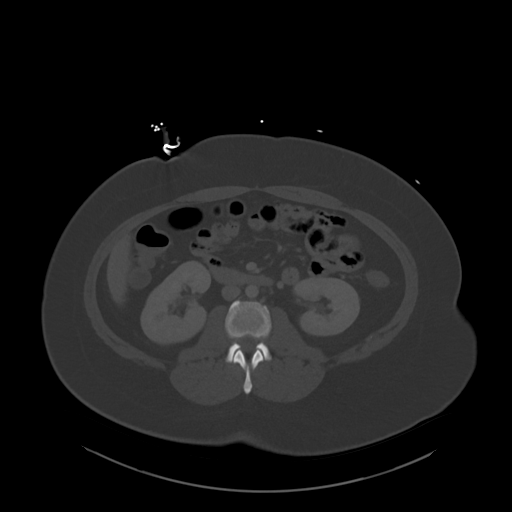
[im 67/91  soft-tissue]
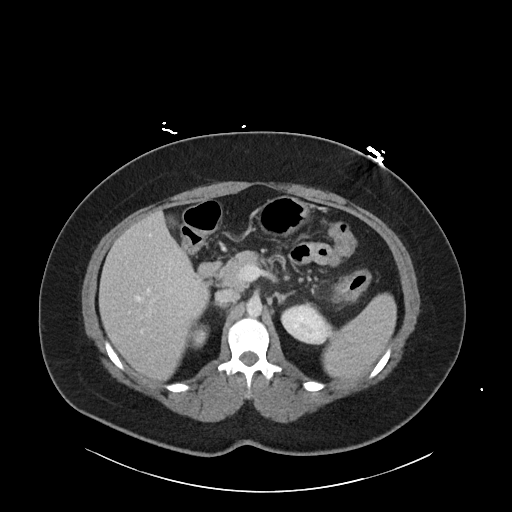
[im 72/91  soft-tissue]
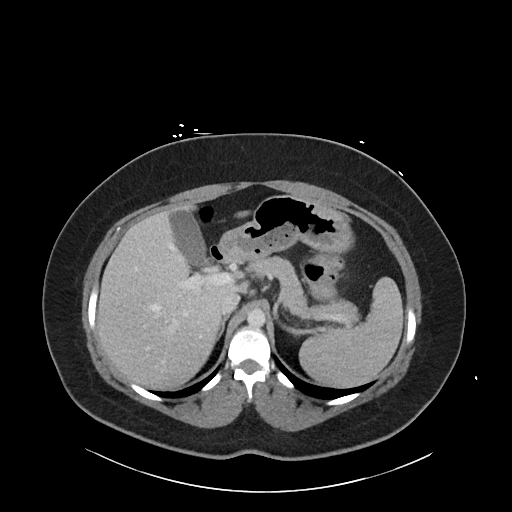
[im 76/91  soft-tissue]
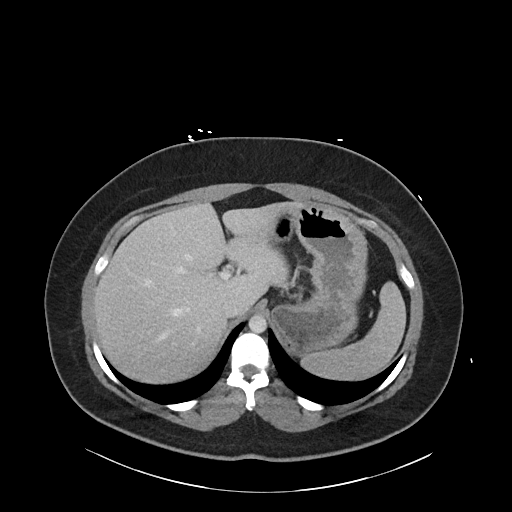
[im 86/91  soft-tissue]
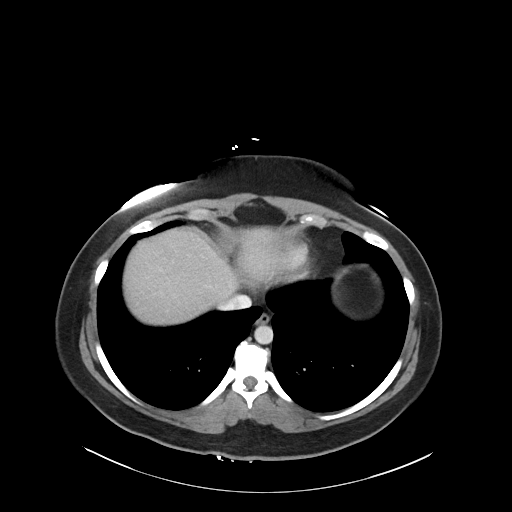

[Series 5: coronal · coronal · 0.90mm/px · 3 of 104 slices shown]
[im 35/104  soft-tissue]
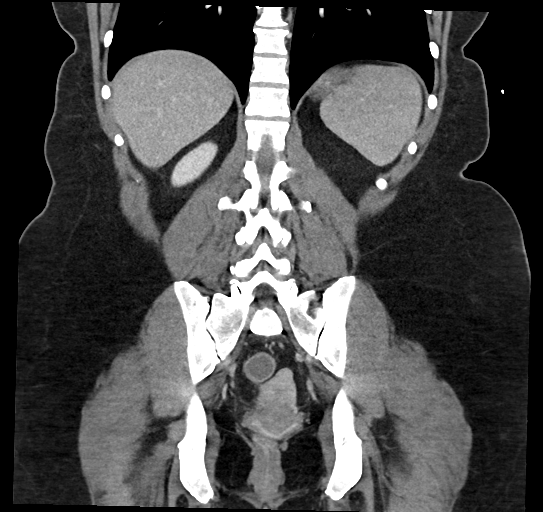
[im 46/104  soft-tissue]
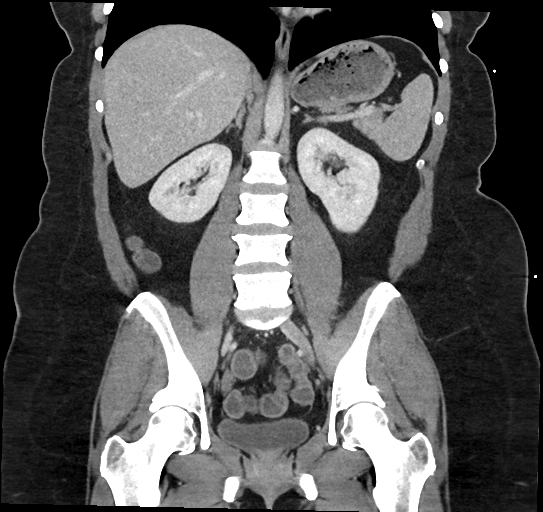
[im 58/104  soft-tissue]
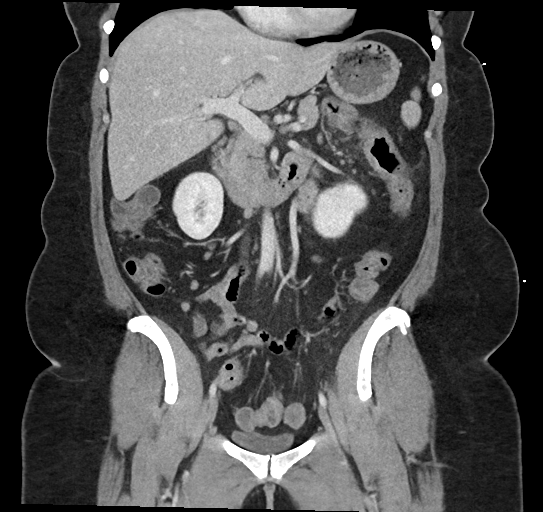

[16 of 46 positions shown; findings below may reference images not displayed]

RADIATION DOSE REDUCTION: This exam was performed according to the
departmental dose-optimization program which includes automated
exposure control, adjustment of the mA and/or kV according to
patient size and/or use of iterative reconstruction technique.

CONTRAST:  100mL OMNIPAQUE IOHEXOL 300 MG/ML  SOLN
FINDINGS: Lower chest: Included lung bases are clear.  Heart size is normal.

Hepatobiliary: No focal liver abnormality is seen. No gallstones,
gallbladder wall thickening, or biliary dilatation.

Pancreas: Unremarkable. No pancreatic ductal dilatation or
surrounding inflammatory changes.

Spleen: Normal in size without focal abnormality.

Adrenals/Urinary Tract: Unremarkable adrenal glands. Stable
subcentimeter lesion within the inferior pole of the left kidney,
previously characterized as a mildly complex cyst. No new focal
renal lesions. No renal stone or hydronephrosis. Urinary bladder
appears unremarkable for the degree of distension.

Stomach/Bowel: Mild wall thickening of the distal transverse and
proximal descending colon with mild pericolonic fat stranding
(series 2, images 21-28). Liquid stool within the colon with
scattered air-fluid levels. A noninflamed appendix is present in the
right lower quadrant (series 2, image 56). Stomach and small bowel
within normal limits. No dilated loops of bowel.

Vascular/Lymphatic: No significant vascular findings are present. No
enlarged abdominal or pelvic lymph nodes.

Reproductive: Uterus and bilateral adnexa are unremarkable.

Other: No free fluid. No abdominopelvic fluid collection. No
pneumoperitoneum. No abdominal wall hernia.

Musculoskeletal: No acute or significant osseous findings.
IMPRESSION: 1. Mild wall thickening of the distal transverse and proximal
descending colon with mild pericolonic fat stranding, suggestive of
an infectious or inflammatory colitis.
2. Liquid stool within the colon with scattered air-fluid levels,
suggestive of a diarrheal state.

## 2022-10-27 ENCOUNTER — Ambulatory Visit (INDEPENDENT_AMBULATORY_CARE_PROVIDER_SITE_OTHER): Payer: 59 | Admitting: Family Medicine

## 2022-10-27 ENCOUNTER — Encounter (INDEPENDENT_AMBULATORY_CARE_PROVIDER_SITE_OTHER): Payer: Self-pay | Admitting: Family Medicine

## 2022-10-27 ENCOUNTER — Other Ambulatory Visit (HOSPITAL_BASED_OUTPATIENT_CLINIC_OR_DEPARTMENT_OTHER): Payer: Self-pay

## 2022-10-27 VITALS — BP 115/76 | HR 91 | Temp 98.5°F | Ht 66.0 in | Wt 223.0 lb

## 2022-10-27 DIAGNOSIS — E669 Obesity, unspecified: Secondary | ICD-10-CM

## 2022-10-27 DIAGNOSIS — R632 Polyphagia: Secondary | ICD-10-CM

## 2022-10-27 DIAGNOSIS — I10 Essential (primary) hypertension: Secondary | ICD-10-CM

## 2022-10-27 DIAGNOSIS — Z6835 Body mass index (BMI) 35.0-35.9, adult: Secondary | ICD-10-CM

## 2022-10-27 DIAGNOSIS — Z6832 Body mass index (BMI) 32.0-32.9, adult: Secondary | ICD-10-CM | POA: Insufficient documentation

## 2022-10-27 DIAGNOSIS — Z6834 Body mass index (BMI) 34.0-34.9, adult: Secondary | ICD-10-CM | POA: Insufficient documentation

## 2022-10-27 MED ORDER — ZEPBOUND 10 MG/0.5ML ~~LOC~~ SOAJ
10.0000 mg | SUBCUTANEOUS | 0 refills | Status: DC
Start: 2022-10-27 — End: 2022-11-24
  Filled 2022-10-27: qty 2, 28d supply, fill #0

## 2022-10-27 NOTE — Progress Notes (Unsigned)
.smr  Office: [phone]  /  Fax: [phone]  WEIGHT SUMMARY AND BIOMETRICS  Anthropometric Measurements Height: 5\' 6"  (1.676 m) Weight: 223 lb (101.2 kg) BMI (Calculated): 36.01 Weight at Last Visit: 230 lb Weight Lost Since Last Visit: 7 lb Weight Gained Since Last Visit: 0 Starting Weight: 273 lb Total Weight Loss (lbs): 50 lb (22.7 kg)   Body Composition  Body Fat %: 42.1 % Fat Mass (lbs): 93.8 lbs Muscle Mass (lbs): 122.6 lbs Total Body Water (lbs): 88.6 lbs Visceral Fat Rating : 11   Other Clinical Data Fasting: No Labs: No Today's Visit #: 29 Starting Date: 08/27/20    Chief Complaint: OBESITY  History of Present Illness   The patient, with a history of obesity, polyphagia, hypertension, and on Zepbound 10mg  weekly, benazepril/hydrochlorothiazide, presents for a follow-up visit. She has been actively working on weight loss through diet and exercise, with a goal of discontinuing her blood pressure medication. She has lost seven pounds in the last month and reports adherence to her category three eating plan 90% of the time. She exercises on an elliptical for 30-45 minutes five times per week.  However, the patient expresses frustration with her weight loss progress, feeling that she has plateaued despite strict adherence to her diet. She reports a daily intake of 1 yogurt for breakfast and another for lunch, and 8 ounces of lean meat for dinner (approx. 500-600 kcal per day) She has eliminated her usual [place] sandwich from her diet, replacing it with a second yogurt at lunch. She also reports occasional consumption of sweet potatoes and vegetables.  Despite her weight loss efforts, the patient reports experiencing excessive hunger, particularly at night, leading to disrupted sleep. She has been managing this by going to bed early to avoid late-night snacking and drinking water when she wakes up hungry. She expresses frustration with her current eating plan and  requests a more structured plan that she can follow strictly.          PHYSICAL EXAM:  Blood pressure 115/76, pulse 91, temperature 98.5 F (36.9 C), height 5\' 6"  (1.676 m), weight 223 lb (101.2 kg), SpO2 98%. Body mass index is 35.99 kg/m.  DIAGNOSTIC DATA REVIEWED:  BMET    Component Value Date/Time   NA 140 11/03/2021 0917   NA 141 05/04/2021 0742   K 4.8 11/03/2021 0917   CL 104 11/03/2021 0917   CO2 25 11/03/2021 0917   GLUCOSE 95 11/03/2021 0917   BUN 13 11/03/2021 0917   BUN 17 05/04/2021 0742   CREATININE 0.88 11/03/2021 0917   CALCIUM 9.8 11/03/2021 0917   GFRNONAA >60 02/08/2021 1244   GFRNONAA 85 07/18/2020 0937   GFRAA 98 07/18/2020 0937   Lab Results  Component Value Date   HGBA1C 5.6 11/03/2021   HGBA1C 5.9 (H) 01/07/2011   Lab Results  Component Value Date   INSULIN 22.3 05/04/2021   INSULIN 28.0 (H) 08/27/2020   Lab Results  Component Value Date   TSH 0.72 11/03/2021   CBC    Component Value Date/Time   WBC 6.3 11/03/2021 0917   RBC 5.22 (H) 11/03/2021 0917   HGB 16.2 (H) 11/03/2021 0917   HGB 15.6 05/04/2021 0742   HCT 47.9 (H) 11/03/2021 0917   HCT 47.5 (H) 05/04/2021 0742   PLT 395 11/03/2021 0917   PLT 325 05/04/2021 0742   MCV 91.8 11/03/2021 0917   MCV 93 05/04/2021 0742   MCH 31.0 11/03/2021 0917   MCHC 33.8 11/03/2021 0917  RDW 13.0 11/03/2021 0917   RDW 13.9 05/04/2021 0742   Iron Studies No results found for: "IRON", "TIBC", "FERRITIN", "IRONPCTSAT" Lipid Panel     Component Value Date/Time   CHOL 157 11/03/2021 0917   CHOL 158 05/04/2021 0742   TRIG 91 11/03/2021 0917   HDL 63 11/03/2021 0917   HDL 54 05/04/2021 0742   CHOLHDL 2.5 11/03/2021 0917   VLDL 29 03/29/2016 1224   LDLCALC 77 11/03/2021 0917   Hepatic Function Panel     Component Value Date/Time   PROT 7.6 11/03/2021 0917   PROT 7.0 05/04/2021 0742   ALBUMIN 4.4 05/04/2021 0742   AST 17 11/03/2021 0917   ALT 16 11/03/2021 0917   ALKPHOS 82  05/04/2021 0742   BILITOT 0.3 11/03/2021 0917   BILITOT 0.3 05/04/2021 0742   BILIDIR 0.1 09/27/2016 0941   IBILI 0.2 09/27/2016 0941      Component Value Date/Time   TSH 0.72 11/03/2021 0917   Nutritional Lab Results  Component Value Date   VD25OH 65.5 05/04/2021   VD25OH 45.7 08/27/2020   VD25OH 32 06/19/2019     Assessment and Plan    Obesity and Polyphagia Patient has lost 7 pounds in the last month and is adhering to her diet and exercise regimen 90% of the time. However, she is not following the prescribed meal plan and is restricting her food intake to a level that will lead to a decrease in metabolism and hurt her long term weight loss efforts. Discussed the importance of maintaining a balanced diet and not restricting food intake to starvation levels. -Transition to category two meal plan and ensure all food is consumed. -will monitor closely for restrictive eating patterns as this can lead to an eating disorder. -Continue Zepbound 10mg  once weekly.  Hypertension Blood pressure is well controlled at 115/76 on current medication (benazepril/hydrochlorothiazide). The patient is aiming to manage her blood pressure through weight loss with the goal of discontinuing medication. -Monitor for symptoms of lightheadedness or dizziness which may indicate a need for medication adjustment. -Continue benazepril/hydrochlorothiazide.  General Health Maintenance -Plan for fasting labs at next visit to assess overall health and potential need for medication adjustments.        She was informed of the importance of frequent follow up visits to maximize her success with intensive lifestyle modifications for her multiple health conditions.    Quillian Quince, MD

## 2022-10-31 ENCOUNTER — Encounter (INDEPENDENT_AMBULATORY_CARE_PROVIDER_SITE_OTHER): Payer: Self-pay | Admitting: Family Medicine

## 2022-11-01 ENCOUNTER — Encounter: Payer: Self-pay | Admitting: Psychiatry

## 2022-11-01 ENCOUNTER — Other Ambulatory Visit: Payer: Self-pay

## 2022-11-01 ENCOUNTER — Ambulatory Visit (INDEPENDENT_AMBULATORY_CARE_PROVIDER_SITE_OTHER): Payer: 59 | Admitting: Psychiatry

## 2022-11-01 DIAGNOSIS — F4010 Social phobia, unspecified: Secondary | ICD-10-CM

## 2022-11-01 DIAGNOSIS — F411 Generalized anxiety disorder: Secondary | ICD-10-CM | POA: Diagnosis not present

## 2022-11-01 MED ORDER — VILAZODONE HCL 20 MG PO TABS
ORAL_TABLET | ORAL | Status: DC
Start: 2022-11-01 — End: 2022-12-13

## 2022-11-01 MED ORDER — VILAZODONE HCL 40 MG PO TABS
40.0000 mg | ORAL_TABLET | Freq: Every day | ORAL | 1 refills | Status: DC
  Filled 2022-11-01: qty 30, 30d supply, fill #0
  Filled 2022-12-09: qty 30, 30d supply, fill #1

## 2022-11-01 NOTE — Progress Notes (Signed)
Dana Washington [facility] [id number] 1975/05/11 47 y.o.  Subjective:   Patient ID:  Dana Washington is a 47 y.o. (DOB 01-18-1975) female.  Chief Complaint:  Chief Complaint  Patient presents with   Anxiety    HPI Dana Washington presents to the office today for follow-up of anxiety, depression, and insomnia. She reports that she has been experiencing some crying episodes. "I'm mad and frustrated and I think the tears just come." She reports sad mood at times. She reports continued anxiety and rumination. She reports occasional irritability. She reports difficulty falling asleep due to rumination. Sleeping 4-5 hours a night. Low energy. She reports motivation is low outside of work. Motivation has been ok at work. Prefers not to leave home on weekends. Enjoys reading. Concentration is adequate without multiple interruptions. She reports that her appetite has been good. Denies SI.   She reports that her niece and nephew are not able to celebrate her birthday with her.   She reports that she is "teary-eyed" and "sometimes can control it a little bit better." Earlier today she felt tears well up and was able not to cry.   Recently reached out about leadership coaching.   Past Psychiatric Medication Trials: Lexapro- Started in Feb 2024. She feels that this has been helpful for tearfulness. Seemed to be initially more effect. Prozac- Ineffective.  Paxil- Weight gain Sertraline Cymbalta Xanax- Rarely takes.  Topamax- prescribed for headaches   Reports being very sensitive to medications.  PHQ2-9    Flowsheet Row Office Visit from 03/04/2022 in [facility], MD Office Visit from 12/17/2021 in Sharlet Salina, MD Office Visit from 11/06/2021 in Sharlet Sal[facility], MD Office Visit from 08/27/2020 in [facility] Visit from 07/22/2020 in Sharlet Sal[facility], MD  PHQ-2 Total Score 0 0 0 1 0  PHQ-9 Total Score -- -- -- 3 --      Flowsheet  Row ED from 02/08/2021 in [facility] Emergency Department at [facility] ED from 09/04/2020 in [facility] Emergency Department at [facility]  C-SSRS RISK CATEGORY No Risk No Risk        Review of Systems:  Review of Systems  Gastrointestinal:  Positive for constipation.  Musculoskeletal:  Negative for gait problem.  Psychiatric/Behavioral:         Please refer to HPI    Medications: I have reviewed the patient's current medications.  Current Outpatient Medications  Medication Sig Dispense Refill   ALPRAZolam (XANAX) 0.5 MG tablet Take 1 tablet (0.5 mg total) by mouth 2 (two) times daily as needed for anxiety. 60 tablet 1   polyethylene glycol powder (GLYCOLAX/MIRALAX) 17 GM/SCOOP powder Take 17 g by mouth 2 (two) times daily as needed. 3350 g 1   Rimegepant Sulfate (NURTEC) 75 MG TBDP Take 75 mg by mouth daily as needed. For migraines. Take as close to onset of migraine as possible. One daily maximum. 6 tablet 0   rosuvastatin (CRESTOR) 20 MG tablet Take 1 tablet (20 mg total) by mouth daily. 90 tablet 3   SYNTHROID 100 MCG tablet Take 1 tablet (100 mcg total) by mouth daily. 90 tablet 1   tirzepatide (ZEPBOUND) 10 MG/0.5ML Pen Inject 10 mg into the skin once a week. 2 mL 0   topiramate (TOPAMAX) 100 MG tablet Take 1 tablet (100 mg total) by mouth at bedtime. 90 tablet 2   Vilazodone HCl (VIIBRYD) 40 MG TABS Take 1 tablet (40 mg total) by mouth  daily. 30 tablet 1   benazepril-hydrochlorthiazide (LOTENSIN HCT) 20-12.5 MG tablet Take 1 tablet by mouth daily. 90 tablet 0   cholecalciferol (VITAMIN D3) 25 MCG (1000 UNIT) tablet Take 1,000 Units by mouth daily.     HYDROcodone-acetaminophen (NORCO/VICODIN) 5-325 MG tablet Take 1 tablet by mouth every 6 (six) hours as needed for moderate pain. (Patient not taking: Reported on 09/10/2022) 30 tablet 0   norethindrone-ethinyl estradiol-iron (BLISOVI FE 1.5/30) 1.5-30 MG-MCG tablet Take 1 tablet by mouth daily. 84 tablet 0    omeprazole (PRILOSEC) 20 MG capsule Take 1 capsule (20 mg total) by mouth daily. 90 capsule 3   Vilazodone HCl 20 MG TABS Take 1.5 tablets daily for one week, then increase to 40 mg daily.     No current facility-administered medications for this visit.    Medication Side Effects: None  Allergies:  Allergies  Allergen Reactions   Bee Venom Hives    Past Medical History:  Diagnosis Date   Allergy    Anxiety    Colon polyps    Constipation    Diabetes mellitus without complication (HCC)    Fundic gland polyps of stomach, benign    GERD (gastroesophageal reflux disease)    Glucose intolerance (impaired glucose tolerance)    Herpes dermatitis    History of motion sickness    Hx of Hashimoto thyroiditis    Hx of sessile serrated colonic polyps 03/04/2021   Hyperlipidemia    Hypertension    Hypothyroidism, secondary    Impingement syndrome of right shoulder    Insomnia    Metabolic syndrome    Migraine    OA (osteoarthritis)    RIGHT SHOULDER AC JOINT   Obese    OCD (obsessive compulsive disorder)    PONV (postoperative nausea and vomiting)    SEVERE after breast reduction   TMJ (dislocation of temporomandibular joint)    Vitamin D deficiency    Vitamin D deficiency     Past Medical History, Surgical history, Social history, and Family history were reviewed and updated as appropriate.   Please see review of systems for further details on the patient's review from today.   Objective:   Physical Exam:  There were no vitals taken for this visit.  Physical Exam Constitutional:      General: She is not in acute distress. Musculoskeletal:        General: No deformity.  Neurological:     Mental Status: She is alert and oriented to person, place, and time.     Coordination: Coordination normal.  Psychiatric:        Attention and Perception: Attention and perception normal. She does not perceive auditory or visual hallucinations.        Mood and Affect: Mood is  anxious and depressed. Affect is tearful. Affect is not labile, blunt, angry or inappropriate.        Speech: Speech normal.        Behavior: Behavior normal.        Thought Content: Thought content normal. Thought content is not paranoid or delusional. Thought content does not include homicidal or suicidal ideation. Thought content does not include homicidal or suicidal plan.        Cognition and Memory: Cognition and memory normal.        Judgment: Judgment normal.     Comments: Insight intact     Lab Review:     Component Value Date/Time   NA 140 11/03/2021 0917   NA 141 05/04/2021  0742   K 4.8 11/03/2021 0917   CL 104 11/03/2021 0917   CO2 25 11/03/2021 0917   GLUCOSE 95 11/03/2021 0917   BUN 13 11/03/2021 0917   BUN 17 05/04/2021 0742   CREATININE 0.88 11/03/2021 0917   CALCIUM 9.8 11/03/2021 0917   PROT 7.6 11/03/2021 0917   PROT 7.0 05/04/2021 0742   ALBUMIN 4.4 05/04/2021 0742   AST 17 11/03/2021 0917   ALT 16 11/03/2021 0917   ALKPHOS 82 05/04/2021 0742   BILITOT 0.3 11/03/2021 0917   BILITOT 0.3 05/04/2021 0742   GFRNONAA >60 02/08/2021 1244   GFRNONAA 85 07/18/2020 0937   GFRAA 98 07/18/2020 0937       Component Value Date/Time   WBC 6.3 11/03/2021 0917   RBC 5.22 (H) 11/03/2021 0917   HGB 16.2 (H) 11/03/2021 0917   HGB 15.6 05/04/2021 0742   HCT 47.9 (H) 11/03/2021 0917   HCT 47.5 (H) 05/04/2021 0742   PLT 395 11/03/2021 0917   PLT 325 05/04/2021 0742   MCV 91.8 11/03/2021 0917   MCV 93 05/04/2021 0742   MCH 31.0 11/03/2021 0917   MCHC 33.8 11/03/2021 0917   RDW 13.0 11/03/2021 0917   RDW 13.9 05/04/2021 0742   LYMPHSABS 1,323 11/03/2021 0917   LYMPHSABS 1.7 05/04/2021 0742   MONOABS 0.7 09/04/2020 0144   EOSABS 38 11/03/2021 0917   EOSABS 0.2 05/04/2021 0742   BASOSABS 69 11/03/2021 0917   BASOSABS 0.1 05/04/2021 0742    No results found for: "POCLITH", "LITHIUM"   No results found for: "PHENYTOIN", "PHENOBARB", "VALPROATE", "CBMZ"    .res Assessment: Plan:    35 minutes spent dedicated to the care of this patient on the date of this encounter to include pre-visit review of records, ordering of medication, post visit documentation, and face-to-face time with the patient discussing recent symptoms and treatment plan. Discussed that therapy would likely be beneficial for her symptoms to help with setting boundaries and reducing negative self-talk. Discussed option to increase Viibryd since she reports that there has been some slight improvement since initiation of Viibryd. Pt agrees to increase in Viibryd. Will increase Viibryd to 30 mg daily for one week, then increase to 40 mg daily for anxiety.  Recommend starting therapy with Stevphen Meuse, [facility] and that therapist may have virtual appointments available sooner than future appointment. Requested office staff schedule appointment with therapist sooner if available.  Pt to follow-up with this provider in 4-6 weeks or sooner if clinically indicated.  Patient advised to contact office with any questions, adverse effects, or acute worsening in signs and symptoms.   Dana Washington was seen today for anxiety.  Diagnoses and all orders for this visit:  Generalized anxiety disorder -     Vilazodone HCl 20 MG TABS; Take 1.5 tablets daily for one week, then increase to 40 mg daily. -     Vilazodone HCl (VIIBRYD) 40 MG TABS; Take 1 tablet (40 mg total) by mouth daily.  Social anxiety disorder -     Vilazodone HCl 20 MG TABS; Take 1.5 tablets daily for one week, then increase to 40 mg daily. -     Vilazodone HCl (VIIBRYD) 40 MG TABS; Take 1 tablet (40 mg total) by mouth daily.     Please see After Visit Summary for patient specific instructions.  Future Appointments  Date Time Provider Department Center  11/09/2022  2:00 PM Stevphen Meuse, [facility] CP-CP None  11/24/2022  4:00 PM Wilder Glade, MD MWM-MWM None  11/29/2022  2:00 PM Stevphen Meuse, [facility] CP-CP None  12/13/2022  4:00 PM  Corie Chiquito, PMHNP CP-CP None  12/14/2022  2:00 PM Stevphen Meuse, [facility] CP-CP None  12/27/2022  2:00 PM Stevphen Meuse, [facility] CP-CP None  12/29/2022  1:00 PM Anson Fret, MD GNA-GNA None  12/30/2022  9:00 AM Quillian Quince D, MD MWM-MWM None  01/13/2023  1:00 PM Stevphen Meuse, [facility] CP-CP None  01/18/2023  9:20 AM MJB-LAB MJB-MJB MJB  01/20/2023  3:00 PM Baxley, Luanna Cole, MD MJB-MJB MJB    No orders of the defined types were placed in this encounter.   -------------------------------

## 2022-11-03 ENCOUNTER — Other Ambulatory Visit: Payer: Self-pay

## 2022-11-06 ENCOUNTER — Other Ambulatory Visit: Payer: Self-pay | Admitting: Internal Medicine

## 2022-11-06 MED ORDER — BENAZEPRIL-HYDROCHLOROTHIAZIDE 20-12.5 MG PO TABS
1.0000 | ORAL_TABLET | Freq: Every day | ORAL | 1 refills | Status: DC
  Filled 2022-11-06: qty 90, 90d supply, fill #0
  Filled 2023-02-01: qty 90, 90d supply, fill #1

## 2022-11-08 ENCOUNTER — Other Ambulatory Visit: Payer: Self-pay

## 2022-11-08 ENCOUNTER — Other Ambulatory Visit (HOSPITAL_COMMUNITY): Payer: Self-pay

## 2022-11-09 ENCOUNTER — Ambulatory Visit: Payer: 59 | Admitting: Psychiatry

## 2022-11-09 ENCOUNTER — Encounter: Payer: Self-pay | Admitting: Psychiatry

## 2022-11-09 ENCOUNTER — Telehealth: Payer: Self-pay | Admitting: Psychiatry

## 2022-11-09 DIAGNOSIS — F411 Generalized anxiety disorder: Secondary | ICD-10-CM | POA: Diagnosis not present

## 2022-11-09 NOTE — Progress Notes (Signed)
[facility] Counsel[facility] Initial Adult Exam  Name: Dana Washington Date: 11/09/2022 MRN: [id number] DOB: Feb 21, 1975 PCP: Margaree Mackintosh, MD  Time spent: 60 minutes start time 1:55 PM.  End time 2:55 PM Virtual Visit via video Note Connected with patient by a telemedicine/telehealth application, with their informed consent, and verified patient privacy and that I am speaking with the correct person using two identifiers. I discussed the limitations, risks, security and privacy concerns of performing psychotherapy and the availability of in person appointments. I also discussed with the patient that there may be a patient responsible charge related to this service. The patient expressed understanding and agreed to proceed. I discussed the treatment planning with the patient. The patient was provided an opportunity to ask questions and all were answered. The patient agreed with the plan and demonstrated an understanding of the instructions. The patient was advised to call  our office if  symptoms worsen or feel they are in a crisis state and need immediate contact.   Therapist Location: home Patient Location: home     Guardian/Payee:  patient    Paperwork requested:  Yes   Reason for Visit /Presenting Problem: Met with patient present for session. She was referred by provider Corie Chiquito PMHNP. Patient stated that she has a boss that she is very fearful of and she is in a hostile work environment. There have been letters written about boss by multiple employees in the past and she has run off many other employees. She went on to share that whenever something comes up it makes her panic. She shared she lives in fear all the time that she is not doing something right. Patient stated that others come to her with frustration over that situation as well.  Patient explained that she has a new supervisor and she is trying to help her set better limits with the other person.  Patient shared she has  been there for over 12 years and during this time there has been lots of undermining and questioning of her abilities so it is very difficult for her to feel affirmed in the situation.  Had patient do the GAD-7 and Patient scored an 11.  Patient shared some of her family history.  She explained that her parents are divorced and currently her father is on his third marriage and her mother lives with her.  Patient is very close to her sister and her sister's children as well as the her other family members.  Discussed patient thinking through what she would like to have his goals for treatment to be discussed at next session.   Mental Status Exam:    Appearance:   Well Groomed     Behavior:  Appropriate  Motor:  Normal  Speech/Language:   Normal Rate  Affect:  Appropriate  Mood:  anxious  Thought process:  normal  Thought content:    WNL  Sensory/Perceptual disturbances:    WNL  Orientation:  oriented to person, place, time/date, and situation  Attention:  Good  Concentration:  Good  Memory:  WNL  Fund of knowledge:   Good  Insight:    Good  Judgment:   Good  Impulse Control:  Good   Reported Symptoms:  anxiety, triggered responses, panic, rumination, sleep issues, focusing issues, crying spells  Risk Assessment: Danger to Self:  No Self-injurious Behavior: No Danger to Others: No Duty to Warn:no Physical Aggression / Violence:No  Access to Firearms a concern: No  Gang Involvement:No  Patient / guardian  was educated about steps to take if suicide or homicide risk level increases between visits: no While future psychiatric events cannot be accurately predicted, the patient does not currently require acute inpatient psychiatric care and does not currently meet [facility] involuntary commitment criteria.  Substance Abuse History: Current substance abuse: No     Past Psychiatric History:   No previous psychological problems have been observed Outpatient Providers:PCP Dr.  Malena Edman History of Psych Hospitalization: No  Psychological Testing:  none    Abuse History: Victim of No.,  none    Report needed: No. Victim of Neglect:No. Perpetrator of  none   Witness / Exposure to Domestic Violence: No   Protective Services Involvement: No  Witness to [employer] Violence:   witnessed some things through work with seeing people with traumatic deaths  Family History:  Family History  Problem Relation Age of Onset   High Cholesterol Mother    Obesity Mother    Thyroid disease Mother    High Cholesterol Father    Cancer Father    Obesity Father    ADD / ADHD Sister    Migraines Sister    Heart disease Maternal Grandfather    Colon cancer Maternal Grandfather        ds in his late 67's   Heart disease Paternal Grandmother    Esophageal cancer Other        mat great aunt, non-smoker   ADD / ADHD Niece    Rectal cancer Neg Hx    Stomach cancer Neg Hx    Colon polyps Neg Hx     Living situation: the patient lives with their family mom moved in with her  Sexual Orientation:  Straight  Relationship Status: single  Name of spouse / other:none             If a parent, number of children / ages:none  Support Systems; family  Financial Stress:  No   Income/Employment/Disability: Employment  Financial planner: No   Educational History: Education: post Engineer, maintenance (IT) work or degree  Religion/Sprituality/World View:    Christian  Any cultural differences that may affect / interfere with treatment:  not applicable   Recreation/Hobbies: read and walk dogs  Stressors:Other: work situation    Strengths:  Family and Hopefulness  Barriers:  none   Legal History: Pending legal issue / charges: The patient has no significant history of legal issues. History of legal issue / charges:  none  Medical History/Surgical History:reviewed Past Medical History:  Diagnosis Date   Allergy    Anxiety    Colon polyps    Constipation    Diabetes mellitus  without complication (HCC)    Fundic gland polyps of stomach, benign    GERD (gastroesophageal reflux disease)    Glucose intolerance (impaired glucose tolerance)    Herpes dermatitis    History of motion sickness    Hx of Hashimoto thyroiditis    Hx of sessile serrated colonic polyps 03/04/2021   Hyperlipidemia    Hypertension    Hypothyroidism, secondary    Impingement syndrome of right shoulder    Insomnia    Metabolic syndrome    Migraine    OA (osteoarthritis)    RIGHT SHOULDER AC JOINT   Obese    OCD (obsessive compulsive disorder)    PONV (postoperative nausea and vomiting)    SEVERE after breast reduction   TMJ (dislocation of temporomandibular joint)    Vitamin D deficiency    Vitamin D deficiency  Past Surgical History:  Procedure Laterality Date   biopsy of lymph node  FEB 2010   BENIGN   BREAST BIOPSY Bilateral    BREAST EXCISIONAL BIOPSY Left    benign   BREAST REDUCTION SURGERY Bilateral MAY 2010   CARPAL TUNNEL RELEASE Bilateral 2012   CHONDROPLASTY Left 10/25/2017   Procedure: CHONDROPLASTY;  Surgeon: Eugenia Mcalpine, MD;  Location: [facility] [place];  Service: Orthopedics;  Laterality: Left;   EXCISION MASS ABDOMINAL N/A 04/14/2017   Procedure: EXCISION OF SUBCUTANEOUS LIPOMA OF LEFT UPPER ABDOMINAL WALL;  Surgeon: Manus Rudd, MD;  Location: [facility];  Service: General;  Laterality: N/A;   KNEE ARTHROSCOPY WITH MEDIAL MENISECTOMY Left 10/25/2017   Procedure: LEFT KNEE ARTHROSCOPY WITH MEDIAL AND LATERAL MENISECTOMY;  Surgeon: Eugenia Mcalpine, MD;  Location: [facility];  Service: Orthopedics;  Laterality: Left;   LASIK  2008   REDUCTION MAMMAPLASTY Bilateral    SHOULDER ARTHROSCOPY WITH ROTATOR CUFF REPAIR AND SUBACROMIAL DECOMPRESSION Right 09/19/2012   Procedure: RIGHT SHOULDER EXAMINE UNDER ANESTHESIA, ARTHROSCOPY,  DEBRIDEMENT, SUBACROMIAL DECOMPRESSION, DISTAL CLAVICLE RESECTION AND  ROTATOR CUFF REPAIR,  LABRAL REPAIR ;  Surgeon: Eugenia Mcalpine, MD;  Location: [facility];  Service: Orthopedics;  Laterality: Right;   WISDOM TOOTH EXTRACTION  AGE 1    Medications: Current Outpatient Medications  Medication Sig Dispense Refill   ALPRAZolam (XANAX) 0.5 MG tablet Take 1 tablet (0.5 mg total) by mouth 2 (two) times daily as needed for anxiety. 60 tablet 1   benazepril-hydrochlorthiazide (LOTENSIN HCT) 20-12.5 MG tablet Take 1 tablet by mouth daily. 90 tablet 1   cholecalciferol (VITAMIN D3) 25 MCG (1000 UNIT) tablet Take 1,000 Units by mouth daily.     HYDROcodone-acetaminophen (NORCO/VICODIN) 5-325 MG tablet Take 1 tablet by mouth every 6 (six) hours as needed for moderate pain. (Patient not taking: Reported on 09/10/2022) 30 tablet 0   norethindrone-ethinyl estradiol-iron (BLISOVI FE 1.5/30) 1.5-30 MG-MCG tablet Take 1 tablet by mouth daily. 84 tablet 0   omeprazole (PRILOSEC) 20 MG capsule Take 1 capsule (20 mg total) by mouth daily. 90 capsule 3   polyethylene glycol powder (GLYCOLAX/MIRALAX) 17 GM/SCOOP powder Take 17 g by mouth 2 (two) times daily as needed. 3350 g 1   Rimegepant Sulfate (NURTEC) 75 MG TBDP Take 75 mg by mouth daily as needed. For migraines. Take as close to onset of migraine as possible. One daily maximum. 6 tablet 0   rosuvastatin (CRESTOR) 20 MG tablet Take 1 tablet (20 mg total) by mouth daily. 90 tablet 3   SYNTHROID 100 MCG tablet Take 1 tablet (100 mcg total) by mouth daily. 90 tablet 1   tirzepatide (ZEPBOUND) 10 MG/0.5ML Pen Inject 10 mg into the skin once a week. 2 mL 0   topiramate (TOPAMAX) 100 MG tablet Take 1 tablet (100 mg total) by mouth at bedtime. 90 tablet 2   Vilazodone HCl (VIIBRYD) 40 MG TABS Take 1 tablet (40 mg total) by mouth daily. 30 tablet 1   Vilazodone HCl 20 MG TABS Take 1.5 tablets daily for one week, then increase to 40 mg daily.     No current facility-administered medications for this visit.    Allergies  Allergen Reactions    Bee Venom Hives    Diagnoses:    ICD-10-CM   1. Generalized anxiety disorder  F41.1       Plan of Care: Patient is to develop treatment plan and set goals at next session.  Patient is  to look into brain spotting and EMDR as treatment options.  Patient is to take medication as directed   Stevphen Meuse, [facility]

## 2022-11-09 NOTE — Telephone Encounter (Signed)
Ms. Dana Washington, temores are scheduled for a virtual visit with your provider today.    Just as we do with appointments in the office, we must obtain your consent to participate.  Your consent will be active for this visit and any virtual visit you may have with one of our providers in the next 365 days.    If you have a MyChart account, I can also send a copy of this consent to you electronically.  All virtual visits are billed to your insurance company just like a traditional visit in the office.  As this is a virtual visit, video technology does not allow for your provider to perform a traditional examination.  This may limit your provider's ability to fully assess your condition.  If your provider identifies any concerns that need to be evaluated in person or the need to arrange testing such as labs, EKG, etc, we will make arrangements to do so.    Although advances in technology are sophisticated, we cannot ensure that it will always work on either your end or our end.  If the connection with a video visit is poor, we may have to switch to a telephone visit.  With either a video or telephone visit, we are not always able to ensure that we have a secure connection.   I need to obtain your verbal consent now.   Are you willing to proceed with your visit today?   [name] has provided verbal consent on 11/09/2022 for a virtual visit (video or telephone).   Stevphen Meuse, [facility] 11/09/2022  1:57 PM

## 2022-11-11 ENCOUNTER — Other Ambulatory Visit: Payer: Self-pay | Admitting: Internal Medicine

## 2022-11-18 ENCOUNTER — Other Ambulatory Visit (HOSPITAL_COMMUNITY): Payer: Self-pay

## 2022-11-19 ENCOUNTER — Other Ambulatory Visit (HOSPITAL_COMMUNITY): Payer: Self-pay

## 2022-11-19 ENCOUNTER — Other Ambulatory Visit: Payer: Self-pay

## 2022-11-19 MED ORDER — NORETHIN ACE-ETH ESTRAD-FE 1.5-30 MG-MCG PO TABS
1.0000 | ORAL_TABLET | Freq: Every day | ORAL | 0 refills | Status: DC
  Filled 2022-11-19: qty 84, 84d supply, fill #0

## 2022-11-24 ENCOUNTER — Encounter: Payer: Self-pay | Admitting: Psychiatry

## 2022-11-24 ENCOUNTER — Other Ambulatory Visit (HOSPITAL_BASED_OUTPATIENT_CLINIC_OR_DEPARTMENT_OTHER): Payer: Self-pay

## 2022-11-24 ENCOUNTER — Other Ambulatory Visit (HOSPITAL_COMMUNITY): Payer: Self-pay

## 2022-11-24 ENCOUNTER — Encounter (INDEPENDENT_AMBULATORY_CARE_PROVIDER_SITE_OTHER): Payer: Self-pay | Admitting: Family Medicine

## 2022-11-24 ENCOUNTER — Ambulatory Visit (INDEPENDENT_AMBULATORY_CARE_PROVIDER_SITE_OTHER): Payer: 59 | Admitting: Family Medicine

## 2022-11-24 VITALS — BP 102/69 | HR 85 | Temp 97.8°F | Ht 66.0 in | Wt 216.0 lb

## 2022-11-24 DIAGNOSIS — E669 Obesity, unspecified: Secondary | ICD-10-CM

## 2022-11-24 DIAGNOSIS — I951 Orthostatic hypotension: Secondary | ICD-10-CM | POA: Diagnosis not present

## 2022-11-24 DIAGNOSIS — Z6834 Body mass index (BMI) 34.0-34.9, adult: Secondary | ICD-10-CM | POA: Diagnosis not present

## 2022-11-24 DIAGNOSIS — F5089 Other specified eating disorder: Secondary | ICD-10-CM | POA: Diagnosis not present

## 2022-11-24 DIAGNOSIS — I9589 Other hypotension: Secondary | ICD-10-CM

## 2022-11-24 DIAGNOSIS — I959 Hypotension, unspecified: Secondary | ICD-10-CM | POA: Insufficient documentation

## 2022-11-24 DIAGNOSIS — R632 Polyphagia: Secondary | ICD-10-CM

## 2022-11-24 DIAGNOSIS — Z6835 Body mass index (BMI) 35.0-35.9, adult: Secondary | ICD-10-CM

## 2022-11-24 MED ORDER — NORETHIN ACE-ETH ESTRAD-FE 1.5-30 MG-MCG PO TABS
1.0000 | ORAL_TABLET | Freq: Every day | ORAL | 4 refills | Status: AC
  Filled 2023-02-10: qty 84, 84d supply, fill #0
  Filled 2023-05-05: qty 84, 84d supply, fill #1
  Filled 2023-07-24: qty 84, 84d supply, fill #2
  Filled 2023-10-16: qty 84, 84d supply, fill #3

## 2022-11-24 MED ORDER — ZEPBOUND 10 MG/0.5ML ~~LOC~~ SOAJ
10.0000 mg | SUBCUTANEOUS | 0 refills | Status: DC
  Filled 2022-11-24: qty 2, 28d supply, fill #0

## 2022-11-24 NOTE — Progress Notes (Signed)
.smr  Office: [phone]  /  Fax: [phone]  WEIGHT SUMMARY AND BIOMETRICS  Anthropometric Measurements Height: 5\' 6"  (1.676 m) Weight: 216 lb (98 kg) BMI (Calculated): 34.88 Weight at Last Visit: 223 lb Weight Lost Since Last Visit: 7 lb Weight Gained Since Last Visit: 0 Starting Weight: 273 lb Total Weight Loss (lbs): 57 lb (25.9 kg)   Body Composition  Body Fat %: 41.3 % Fat Mass (lbs): 89.4 lbs Muscle Mass (lbs): 120.6 lbs Total Body Water (lbs): 88.2 lbs Visceral Fat Rating : 11   Other Clinical Data Fasting: No Labs: No Today's Visit #: 30 Starting Date: 08/27/20    Chief Complaint: OBESITY   History of Present Illness   The patient, with a history of emotional eating behaviors, polyphasia, and obesity, has been following a category two eating plan and has lost seven pounds in the last month. She has been using an elliptical and treadmill five times per week. She is currently on tirzepatide 10mg  weekly for polyphasia and has requested a refill.  The patient has been working on reducing emotional eating behaviors and has been adhering to the meal plan, even exceeding the recommended intake. She has been eating the entire meal plan and then some, which has been facilitated by her mother following the same plan.  The patient has been experiencing some anxiety related to concerns about developing a restricted eating disorder, which has been a significant worry for her. She has been very regimented in following the meal plan, even to the point of avoiding any deviations such as the use of oil in cooking.  The patient has also been experiencing some lightheadedness upon standing, which she suspects may be due to dehydration. She has been drinking approximately 60 ounces of water at work, with additional intake at home. She has not experienced any nausea with the tirzepatide.  The patient has been managing celebration eating on special occasions, such as her birthday,  by making mindful choices. She has been choosing smaller portions and avoiding high-calorie options. She has also been managing eating out by choosing healthier options, such as salads and grilled chicken.  The patient's exercise regimen has been consistent, with daily use of an under-desk elliptical and regular treadmill use. She has not reported any adverse effects from her medication or exercise regimen.          PHYSICAL EXAM:  Blood pressure 102/69, pulse 85, temperature 97.8 F (36.6 C), height 5\' 6"  (1.676 m), weight 216 lb (98 kg), SpO2 98%. Body mass index is 34.86 kg/m.  DIAGNOSTIC DATA REVIEWED:  BMET    Component Value Date/Time   NA 140 11/03/2021 0917   NA 141 05/04/2021 0742   K 4.8 11/03/2021 0917   CL 104 11/03/2021 0917   CO2 25 11/03/2021 0917   GLUCOSE 95 11/03/2021 0917   BUN 13 11/03/2021 0917   BUN 17 05/04/2021 0742   CREATININE 0.88 11/03/2021 0917   CALCIUM 9.8 11/03/2021 0917   GFRNONAA >60 02/08/2021 1244   GFRNONAA 85 07/18/2020 0937   GFRAA 98 07/18/2020 0937   Lab Results  Component Value Date   HGBA1C 5.6 11/03/2021   HGBA1C 5.9 (H) 01/07/2011   Lab Results  Component Value Date   INSULIN 22.3 05/04/2021   INSULIN 28.0 (H) 08/27/2020   Lab Results  Component Value Date   TSH 0.72 11/03/2021   CBC    Component Value Date/Time   WBC 6.3 11/03/2021 0917   RBC 5.22 (H) 11/03/2021 2952  HGB 16.2 (H) 11/03/2021 0917   HGB 15.6 05/04/2021 0742   HCT 47.9 (H) 11/03/2021 0917   HCT 47.5 (H) 05/04/2021 0742   PLT 395 11/03/2021 0917   PLT 325 05/04/2021 0742   MCV 91.8 11/03/2021 0917   MCV 93 05/04/2021 0742   MCH 31.0 11/03/2021 0917   MCHC 33.8 11/03/2021 0917   RDW 13.0 11/03/2021 0917   RDW 13.9 05/04/2021 0742   Iron Studies No results found for: "IRON", "TIBC", "FERRITIN", "IRONPCTSAT" Lipid Panel     Component Value Date/Time   CHOL 157 11/03/2021 0917   CHOL 158 05/04/2021 0742   TRIG 91 11/03/2021 0917   HDL 63  11/03/2021 0917   HDL 54 05/04/2021 0742   CHOLHDL 2.5 11/03/2021 0917   VLDL 29 03/29/2016 1224   LDLCALC 77 11/03/2021 0917   Hepatic Function Panel     Component Value Date/Time   PROT 7.6 11/03/2021 0917   PROT 7.0 05/04/2021 0742   ALBUMIN 4.4 05/04/2021 0742   AST 17 11/03/2021 0917   ALT 16 11/03/2021 0917   ALKPHOS 82 05/04/2021 0742   BILITOT 0.3 11/03/2021 0917   BILITOT 0.3 05/04/2021 0742   BILIDIR 0.1 09/27/2016 0941   IBILI 0.2 09/27/2016 0941      Component Value Date/Time   TSH 0.72 11/03/2021 0917   Nutritional Lab Results  Component Value Date   VD25OH 65.5 05/04/2021   VD25OH 45.7 08/27/2020   VD25OH 32 06/19/2019     Assessment and Plan    Obesity   Patient has lost seven pounds in the last month by adhering to a category two eating plan and exercising five times per week. She is on tirzepatide (Zepbound) 10 mg weekly for polyphagia with no reported nausea. Discussed the importance of hydration to prevent dehydration and balanced eating to avoid disordered eating patterns.   - Refill tirzepatide (Zepbound) 10 mg weekly   - Encourage continued adherence to the category two eating plan   - Encourage continued exercise regimen   - Increase hydration to 80 ounces of calorie-free, caffeine-free liquid daily    Emotional Eating   Patient is working on reducing emotional eating behaviors and is doing well. Reassured her about eating disorder concerns based on current behaviors. Emphasized balanced eating and occasional indulgences to prevent disordered eating patterns.   - Continue current strategies to manage emotional eating   - Reassure patient about eating disorder concerns based on current behaviors    Orthostatic Hypotension   Patient reports lightheadedness upon standing, possibly related to dehydration. Blood pressure is 102/69, which is not too low unless symptomatic. Advised to increase hydration to see if symptoms improve.   - Increase  hydration to 80 ounces of calorie-free, caffeine-free liquid daily   - Monitor symptoms and report if they persist    General Health Maintenance   Patient has a December appointment for fasting blood work and needs to schedule a January appointment.   - Schedule January appointment   - Perform fasting blood work in December.        She was informed of the importance of frequent follow up visits to maximize her success with intensive lifestyle modifications for her multiple health conditions.    Quillian Quince, MD

## 2022-11-25 ENCOUNTER — Other Ambulatory Visit (HOSPITAL_BASED_OUTPATIENT_CLINIC_OR_DEPARTMENT_OTHER): Payer: Self-pay

## 2022-11-29 ENCOUNTER — Ambulatory Visit: Payer: 59 | Admitting: Psychiatry

## 2022-11-29 DIAGNOSIS — F411 Generalized anxiety disorder: Secondary | ICD-10-CM

## 2022-11-29 NOTE — Progress Notes (Signed)
      [facility] Counselor/Therapist Progress Note  Patient ID: Dana Washington, MRN: [id number],    Date: 11/29/2022  Time Spent: 49 minutes start time 2:02 PM end time 2:51 PM Virtual Visit via Video Note Connected with patient by a telemedicine/telehealth application, with their informed consent, and verified patient privacy and that I am speaking with the correct person using two identifiers. I discussed the limitations, risks, security and privacy concerns of performing psychotherapy and the availability of in person appointments. I also discussed with the patient that there may be a patient responsible charge related to this service. The patient expressed understanding and agreed to proceed. I discussed the treatment planning with the patient. The patient was provided an opportunity to ask questions and all were answered. The patient agreed with the plan and demonstrated an understanding of the instructions. The patient was advised to call  our office if  symptoms worsen or feel they are in a crisis state and need immediate contact.   Therapist Location: home Patient Location: home    Treatment Type: Individual Therapy  Reported Symptoms: anxiety, sadness, triggered responses, panic, rumination, sleep issues  Mental Status Exam:  Appearance:   Casual     Behavior:  Appropriate  Motor:  Normal  Speech/Language:   Normal Rate  Affect:  Appropriate  Mood:  anxious  Thought process:  normal  Thought content:    WNL  Sensory/Perceptual disturbances:    WNL  Orientation:  oriented to person, place, time/date, and situation  Attention:  Good  Concentration:  Good  Memory:  WNL  Fund of knowledge:   Good  Insight:    Good  Judgment:   Good  Impulse Control:  Good   Risk Assessment: Danger to Self:  No Self-injurious Behavior: No Danger to Others: No Duty to Warn:no Physical Aggression / Violence:No  Access to Firearms a concern: No  Gang Involvement:No    Subjective: Met with patient via virtual session. She shared that she did make it 9 days without tears which is positive.  Things are still stressful at work.  Discussed treatment plan and developed goals in session.  Patient was taught multiple grounding exercises including counting backwards back to grounded by and brain spotting exercise vergence.  She was able to feel comfortable that 1 of those exercises would work for her.  Patient stated she is been waking up at 3:30 in the morning and even though she is having to go to bed between 730 and 8 and sleeping until 330 it is very frustrating.  Discussed some different things that she could try to see if she can get on a better sleep schedule so that she is not waking up so early in the morning.  Patient was encouraged to journal what surfaces over the next few week to be addressed at next session.  Interventions: Solution-Oriented/Positive Psychology  Diagnosis:   ICD-10-CM   1. Generalized anxiety disorder  F41.1       Plan: Patient is to use coping skills discussed in session.  Patient is to work on exercising regularly.  Patient is to look at [facility] is the first dose by Dr. Casilda Carls.  Patient is to take medication as directed.  Stevphen Meuse, [facility]

## 2022-12-10 ENCOUNTER — Other Ambulatory Visit (HOSPITAL_COMMUNITY): Payer: Self-pay

## 2022-12-13 ENCOUNTER — Encounter: Payer: Self-pay | Admitting: Psychiatry

## 2022-12-13 ENCOUNTER — Ambulatory Visit (INDEPENDENT_AMBULATORY_CARE_PROVIDER_SITE_OTHER): Payer: 59 | Admitting: Psychiatry

## 2022-12-13 DIAGNOSIS — F411 Generalized anxiety disorder: Secondary | ICD-10-CM | POA: Diagnosis not present

## 2022-12-13 DIAGNOSIS — F4010 Social phobia, unspecified: Secondary | ICD-10-CM | POA: Diagnosis not present

## 2022-12-13 DIAGNOSIS — G4700 Insomnia, unspecified: Secondary | ICD-10-CM | POA: Diagnosis not present

## 2022-12-13 MED ORDER — VILAZODONE HCL 40 MG PO TABS
40.0000 mg | ORAL_TABLET | Freq: Every day | ORAL | 1 refills | Status: DC
  Filled 2022-12-13 – 2023-01-04 (×2): qty 90, 90d supply, fill #0
  Filled 2023-04-04: qty 90, 90d supply, fill #1

## 2022-12-13 MED ORDER — ALPRAZOLAM 0.5 MG PO TABS
0.5000 mg | ORAL_TABLET | Freq: Two times a day (BID) | ORAL | 1 refills | Status: AC | PRN
  Filled 2022-12-13: qty 60, 30d supply, fill #0
  Filled 2023-04-27: qty 60, 30d supply, fill #1

## 2022-12-13 NOTE — Progress Notes (Unsigned)
Dana Washington [facility] [id number] 01-23-1975 47 y.o.  Subjective:   Patient ID:  Dana Washington is a 47 y.o. (DOB [birth year]) female.  Chief Complaint: No chief complaint on file.   HPI Dana Washington presents to the office today for follow-up of *** She reports some slight improvement in anxiety. She reports that she has been working on focusing more on gratitude. She had anxiety last night in anticipation of returning to work. She reports anxiety about situational stressor. She reports that uncontrolled crying has stopped. Denies panic attacks. Denies depressed mood. Reports that she is sleeping well on the weekends and sleeps 4-5 hours through the week. Energy and motivation are ok. Concentration is adequate. Appetite has been ok. Denies SI.   She has anxiety with driving places that are not   She has started seeing Dana Washington, [facility].   Past Psychiatric Medication Trials: Lexapro- Started in Feb 2024. She feels that this has been helpful for tearfulness. Seemed to be initially more effect. Prozac- Ineffective.  Paxil- Weight gain Sertraline Cymbalta Xanax- Rarely takes.  Topamax- prescribed for headaches   Reports being very sensitive to medications.  PHQ2-9    Flowsheet Row Office Visit from 03/04/2022 in Dana Salina, MD Office Visit from 12/17/2021 in Dana Salina, MD Office Visit from 11/06/2021 in Dana Salina, MD Office Visit from 08/27/2020 in [facility] at [facility] Visit from 07/22/2020 in Dana Salina, MD  PHQ-2 Total Score 0 0 0 1 0  PHQ-9 Total Score -- -- -- 3 --      Flowsheet Row ED from 02/08/2021 in [facility] Emergency Department at [employer] ED from 09/04/2020 in [facility] Emergency Department at [facility]  C-SSRS RISK CATEGORY No Risk No Risk        Review of Systems:  Review of Systems  Gastrointestinal:  Positive for constipation.  Musculoskeletal:  Negative for gait  problem.  Psychiatric/Behavioral:         Please refer to HPI    Medications: I have reviewed the patient's current medications.  Current Outpatient Medications  Medication Sig Dispense Refill   ALPRAZolam (XANAX) 0.5 MG tablet Take 1 tablet (0.5 mg total) by mouth 2 (two) times daily as needed for anxiety. 60 tablet 1   benazepril-hydrochlorthiazide (LOTENSIN HCT) 20-12.5 MG tablet Take 1 tablet by mouth daily. 90 tablet 1   cholecalciferol (VITAMIN D3) 25 MCG (1000 UNIT) tablet Take 1,000 Units by mouth daily.     HYDROcodone-acetaminophen (NORCO/VICODIN) 5-325 MG tablet Take 1 tablet by mouth every 6 (six) hours as needed for moderate pain. (Patient not taking: Reported on 09/10/2022) 30 tablet 0   norethindrone-ethinyl estradiol-iron (BLISOVI FE 1.5/30) 1.5-30 MG-MCG tablet Take 1 tablet by mouth daily. 84 tablet 4   omeprazole (PRILOSEC) 20 MG capsule Take 1 capsule (20 mg total) by mouth daily. 90 capsule 3   polyethylene glycol powder (GLYCOLAX/MIRALAX) 17 GM/SCOOP powder Take 17 g by mouth 2 (two) times daily as needed. 3350 g 1   Rimegepant Sulfate (NURTEC) 75 MG TBDP Take 75 mg by mouth daily as needed. For migraines. Take as close to onset of migraine as possible. One daily maximum. 6 tablet 0   rosuvastatin (CRESTOR) 20 MG tablet Take 1 tablet (20 mg total) by mouth daily. 90 tablet 3   SYNTHROID 100 MCG tablet TAKE 1 TABLET (100 MCG TOTAL) DAILY. 90 tablet 1   tirzepatide (ZEPBOUND) 10 MG/0.5ML Pen Inject 10 mg into  the skin once a week. 2 mL 0   topiramate (TOPAMAX) 100 MG tablet Take 1 tablet (100 mg total) by mouth at bedtime. 90 tablet 2   Vilazodone HCl (VIIBRYD) 40 MG TABS Take 1 tablet (40 mg total) by mouth daily. 30 tablet 1   Vilazodone HCl 20 MG TABS Take 1.5 tablets daily for one week, then increase to 40 mg daily.     No current facility-administered medications for this visit.    Medication Side Effects: None  Allergies:  Allergies  Allergen Reactions   Bee  Venom Hives    Past Medical History:  Diagnosis Date   Allergy    Anxiety    Colon polyps    Constipation    Diabetes mellitus without complication (HCC)    Fundic gland polyps of stomach, benign    GERD (gastroesophageal reflux disease)    Glucose intolerance (impaired glucose tolerance)    Herpes dermatitis    History of motion sickness    Hx of Hashimoto thyroiditis    Hx of sessile serrated colonic polyps 03/04/2021   Hyperlipidemia    Hypertension    Hypothyroidism, secondary    Impingement syndrome of right shoulder    Insomnia    Metabolic syndrome    Migraine    OA (osteoarthritis)    RIGHT SHOULDER AC JOINT   Obese    OCD (obsessive compulsive disorder)    PONV (postoperative nausea and vomiting)    SEVERE after breast reduction   TMJ (dislocation of temporomandibular joint)    Vitamin D deficiency    Vitamin D deficiency     Past Medical History, Surgical history, Social history, and Family history were reviewed and updated as appropriate.   Please see review of systems for further details on the patient's review from today.   Objective:   Physical Exam:  There were no vitals taken for this visit.  Physical Exam  Lab Review:     Component Value Date/Time   NA 140 11/03/2021 0917   NA 141 05/04/2021 0742   K 4.8 11/03/2021 0917   CL 104 11/03/2021 0917   CO2 25 11/03/2021 0917   GLUCOSE 95 11/03/2021 0917   BUN 13 11/03/2021 0917   BUN 17 05/04/2021 0742   CREATININE 0.88 11/03/2021 0917   CALCIUM 9.8 11/03/2021 0917   PROT 7.6 11/03/2021 0917   PROT 7.0 05/04/2021 0742   ALBUMIN 4.4 05/04/2021 0742   AST 17 11/03/2021 0917   ALT 16 11/03/2021 0917   ALKPHOS 82 05/04/2021 0742   BILITOT 0.3 11/03/2021 0917   BILITOT 0.3 05/04/2021 0742   GFRNONAA >60 02/08/2021 1244   GFRNONAA 85 07/18/2020 0937   GFRAA 98 07/18/2020 0937       Component Value Date/Time   WBC 6.3 11/03/2021 0917   RBC 5.22 (H) 11/03/2021 0917   HGB 16.2 (H)  11/03/2021 0917   HGB 15.6 05/04/2021 0742   HCT 47.9 (H) 11/03/2021 0917   HCT 47.5 (H) 05/04/2021 0742   PLT 395 11/03/2021 0917   PLT 325 05/04/2021 0742   MCV 91.8 11/03/2021 0917   MCV 93 05/04/2021 0742   MCH 31.0 11/03/2021 0917   MCHC 33.8 11/03/2021 0917   RDW 13.0 11/03/2021 0917   RDW 13.9 05/04/2021 0742   LYMPHSABS 1,323 11/03/2021 0917   LYMPHSABS 1.7 05/04/2021 0742   MONOABS 0.7 09/04/2020 0144   EOSABS 38 11/03/2021 0917   EOSABS 0.2 05/04/2021 0742   BASOSABS 69 11/03/2021 0917  BASOSABS 0.1 05/04/2021 0742    No results found for: "POCLITH", "LITHIUM"   No results found for: "PHENYTOIN", "PHENOBARB", "VALPROATE", "CBMZ"   .res Assessment: Plan:    There are no diagnoses linked to this encounter.   Please see After Visit Summary for patient specific instructions.  Future Appointments  Date Time Provider Department Center  12/14/2022  2:00 PM Dana Washington, [facility] CP-CP None  12/27/2022  2:00 PM Dana Washington, [facility] CP-CP None  12/29/2022  1:00 PM Anson Fret, MD GNA-GNA None  12/30/2022  9:00 AM Quillian Quince D, MD MWM-MWM None  01/13/2023  1:00 PM Dana Washington, [facility] CP-CP None  01/18/2023  9:20 AM MJB-LAB MJB-MJB MJB  01/19/2023  3:40 PM Wilder Glade, MD MWM-MWM None  01/20/2023  3:00 PM Margaree Mackintosh, MD MJB-MJB MJB  02/16/2023  4:00 PM Wilder Glade, MD MWM-MWM None    No orders of the defined types were placed in this encounter.   -------------------------------

## 2022-12-14 ENCOUNTER — Ambulatory Visit: Payer: 59 | Admitting: Psychiatry

## 2022-12-14 ENCOUNTER — Other Ambulatory Visit: Payer: Self-pay

## 2022-12-14 ENCOUNTER — Other Ambulatory Visit (HOSPITAL_COMMUNITY): Payer: Self-pay

## 2022-12-14 DIAGNOSIS — F411 Generalized anxiety disorder: Secondary | ICD-10-CM

## 2022-12-14 NOTE — Progress Notes (Unsigned)
[facility] Counselor/Therapist Progress Note  Patient ID: Dana Washington, MRN: [id number],    Date: 12/14/2022  Time Spent: 58 minutes start time 2:04 PM end time 3:02 PM Virtual Visit via Video Note Connected with patient by a telemedicine/telehealth application, with their informed consent, and verified patient privacy and that I am speaking with the correct person using two identifiers. I discussed the limitations, risks, security and privacy concerns of performing psychotherapy and the availability of in person appointments. I also discussed with the patient that there may be a patient responsible charge related to this service. The patient expressed understanding and agreed to proceed. I discussed the treatment planning with the patient. The patient was provided an opportunity to ask questions and all were answered. The patient agreed with the plan and demonstrated an understanding of the instructions. The patient was advised to call  our office if  symptoms worsen or feel they are in a crisis state and need immediate contact.   Therapist Location: home Patient Location: home    Treatment Type: Individual Therapy  Reported Symptoms: anxiety, sadness, triggered responses, rumination, panic  Mental Status Exam:  Appearance:   Casual     Behavior:  Appropriate  Motor:  Normal  Speech/Language:   Normal Rate  Affect:  Appropriate tearful  Mood:  anxious  Thought process:  normal  Thought content:    WNL  Sensory/Perceptual disturbances:    WNL  Orientation:  oriented to person, place, time/date, and situation  Attention:  Good  Concentration:  Good  Memory:  WNL  Fund of knowledge:   Good  Insight:    Good  Judgment:   Good  Impulse Control:  Good   Risk Assessment: Danger to Self:  No Self-injurious Behavior: No Danger to Others: No Duty to Warn:no Physical Aggression / Violence:No  Access to Firearms a concern: No  Gang Involvement:No   Subjective: Met  with patient via virtual session. She shared that she is stil having lots of work stress. She went on to share that she has a Psychologist, occupational at work who is recommending that she sets a limit with an employee.  Patient shared some of the situations that are occurring at work.  Patient was able to see that setting limits and being clear is challenging for her.  Also through the discussion she was able to recognize the things that she is trying and the things she is doing are taking a lot of her time and energy and are not being effective with her employees.  As discussion continued patient was able to realize she is triggering her employees like she would want to be treated and expecting them to respond the same way but reality is perceptions and how people respond is going to be different.  Encouraged her to realize because she works hard at always doing the right things and following steps and recognizing boundaries it does not mean the 26 people under her when she was well.  As patient was able to recognize that her perceptions need to be more realistic when interacting with her employees and made it easier for her to think through how to set appropriate limits and what she needs to do to change the dynamics within her work place for herself if nothing else.  Patient was encouraged to think of CBT skills and grounding skills that she can utilize to help her with that process.  She was also encouraged to make sure she is releasing any  negative emotions appropriately on a regular basis.   Interventions: Cognitive Behavioral Therapy and Solution-Oriented/Positive Psychology  Diagnosis:   ICD-10-CM   1. Generalized anxiety disorder  F41.1       Plan: Patient is to use coping skills discussed in session.  Patient is to follow plans from session to deal with workplace situations differently to decrease anxiety symptoms.  Patient is to work on exercising regularly.  Patient is to look at [facility] is the first dose by Dr.  Casilda Carls.  Patient is to take medication as directed.    Stevphen Meuse, [facility]

## 2022-12-22 ENCOUNTER — Other Ambulatory Visit (HOSPITAL_COMMUNITY): Payer: Self-pay

## 2022-12-27 ENCOUNTER — Other Ambulatory Visit (HOSPITAL_COMMUNITY): Payer: Self-pay

## 2022-12-27 ENCOUNTER — Ambulatory Visit (INDEPENDENT_AMBULATORY_CARE_PROVIDER_SITE_OTHER): Payer: 59 | Admitting: Psychiatry

## 2022-12-27 ENCOUNTER — Telehealth: Payer: 59 | Admitting: Neurology

## 2022-12-27 DIAGNOSIS — F411 Generalized anxiety disorder: Secondary | ICD-10-CM | POA: Diagnosis not present

## 2022-12-27 NOTE — Progress Notes (Signed)
      [facility] Counselor/Therapist Progress Note  Patient ID: Dana Washington, MRN: [id number],    Date: 12/27/2022  Time Spent: 48 minutes start time 1:53 PM end time 2:41 PM Virtual Visit via Video Note Connected with patient by a telemedicine/telehealth application, with their informed consent, and verified patient privacy and that I am speaking with the correct person using two identifiers. I discussed the limitations, risks, security and privacy concerns of performing psychotherapy and the availability of in person appointments. I also discussed with the patient that there may be a patient responsible charge related to this service. The patient expressed understanding and agreed to proceed. I discussed the treatment planning with the patient. The patient was provided an opportunity to ask questions and all were answered. The patient agreed with the plan and demonstrated an understanding of the instructions. The patient was advised to call  our office if  symptoms worsen or feel they are in a crisis state and need immediate contact.   Therapist Location: home Patient Location: home    Treatment Type: Individual Therapy  Reported Symptoms: anxiety, panic, triggered response, rumination  Mental Status Exam:  Appearance:   Well Groomed     Behavior:  Appropriate  Motor:  Normal  Speech/Language:   Normal Rate  Affect:  Appropriate  Mood:  anxious  Thought process:  normal  Thought content:    WNL  Sensory/Perceptual disturbances:    WNL  Orientation:  oriented to person, place, time/date, and situation  Attention:  Good  Concentration:  Good  Memory:  WNL  Fund of knowledge:   Good  Insight:    Good  Judgment:   Good  Impulse Control:  Good   Risk Assessment: Danger to Self:  No Self-injurious Behavior: No Danger to Others: No Duty to Warn:no Physical Aggression / Violence:No  Access to Firearms a concern: No  Gang Involvement:No   Subjective: Met with patient  via virtual session. She went on to share that she is feeling lots of anxiety due to having to terminate an employee later. Patient shared that things have been stressful with her and she has not followed policy so she will have to let her go even though she is concerned there will be retaliation. Did processing set on employee saying she was recording the conversation. SUDS level 8,  negative cognition "I'm doing something wrong". Felt anxiety in chest.  Patient was able to resolve the set and reduce SUDS level to 1. She was able to develop some visuals she could use to help her through her anxiety. She was also able to realize she has to do it due to keep patients safe and she can do the right things.    Interventions: Eye Movement Desensitization and Reprocessing (EMDR) and Insight-Oriented  Diagnosis:   ICD-10-CM   1. Generalized anxiety disorder  F41.1       Plan:  Patient is to use coping skills and CBT skills.  Patient is to follow plans from session to deal with workplace situations differently to decrease anxiety symptoms.  Patient is to work on exercising regularly.  Patient is to look at [facility] is the first dose by Dr. Casilda Carls.  Patient is to take medication as directed.    Stevphen Meuse, [facility]

## 2022-12-29 ENCOUNTER — Telehealth (INDEPENDENT_AMBULATORY_CARE_PROVIDER_SITE_OTHER): Payer: 59 | Admitting: Neurology

## 2022-12-29 ENCOUNTER — Other Ambulatory Visit (HOSPITAL_COMMUNITY): Payer: Self-pay

## 2022-12-29 ENCOUNTER — Telehealth: Payer: Self-pay | Admitting: Neurology

## 2022-12-29 DIAGNOSIS — G43709 Chronic migraine without aura, not intractable, without status migrainosus: Secondary | ICD-10-CM

## 2022-12-29 MED ORDER — NURTEC 75 MG PO TBDP
75.0000 mg | ORAL_TABLET | Freq: Every day | ORAL | 0 refills | Status: DC | PRN
  Filled 2022-12-29: qty 4, 4d supply, fill #0

## 2022-12-29 MED ORDER — AJOVY 225 MG/1.5ML ~~LOC~~ SOAJ: 225.0000 mg | SUBCUTANEOUS | 0 refills | Status: DC

## 2022-12-29 MED ORDER — NURTEC 75 MG PO TBDP: 75.0000 mg | ORAL_TABLET | Freq: Every day | ORAL | 0 refills | Status: AC | PRN

## 2022-12-29 MED ORDER — UBRELVY 100 MG PO TABS: 100.0000 mg | ORAL_TABLET | ORAL | 0 refills | Status: DC | PRN

## 2022-12-29 NOTE — Patient Instructions (Addendum)
Inject Ajovy once monthly  Ubrelvy or nurtec prn  Ubrogepant Tablets What is this medication? UBROGEPANT (ue BROE je pant) treats migraines. It works by blocking a substance in the body that causes migraines. It is not used to prevent migraines. This medicine may be used for other purposes; ask your health care provider or pharmacist if you have questions. COMMON BRAND NAME(S): Bernita Raisin What should I tell my care team before I take this medication? They need to know if you have any of these conditions: Kidney disease Liver disease An unusual or allergic reaction to ubrogepant, other medications, foods, dyes, or preservatives Pregnant or trying to get pregnant Breast-feeding How should I use this medication? Take this medication by mouth with a glass of water. Take it as directed on the prescription label. You can take it with or without food. If it upsets your stomach, take it with food. Keep taking it unless your care team tells you to stop. Talk to your care team about the use of this medication in children. Special care may be needed. Overdosage: If you think you have taken too much of this medicine contact a poison control center or emergency room at once. NOTE: This medicine is only for you. Do not share this medicine with others. What if I miss a dose? This does not apply. This medication is not for regular use. What may interact with this medication? Do not take this medication with any of the following: Adagrasib Ceritinib Certain antibiotics, such as chloramphenicol, clarithromycin, telithromycin Certain antivirals for HIV, such as atazanavir, cobicistat, darunavir, delavirdine, fosamprenavir, indinavir, ritonavir Certain medications for fungal infections, such as itraconazole, ketoconazole, posaconazole, voriconazole Conivaptan Grapefruit Idelalisib Mifepristone Nefazodone Ribociclib This medication may also interact with the following: Carvedilol Certain medications  for seizures, such as phenobarbital, phenytoin Ciprofloxacin Cyclosporine Eltrombopag Fluconazole Fluvoxamine Quinidine Rifampin St. John's wort Verapamil This list may not describe all possible interactions. Give your health care provider a list of all the medicines, herbs, non-prescription drugs, or dietary supplements you use. Also tell them if you smoke, drink alcohol, or use illegal drugs. Some items may interact with your medicine. What should I watch for while using this medication? Visit your care team for regular checks on your progress. Tell your care team if your symptoms do not start to get better or if they get worse. Your mouth may get dry. Chewing sugarless gum or sucking hard candy and drinking plenty of water may help. Contact your care team if the problem does not go away or is severe. What side effects may I notice from receiving this medication? Side effects that you should report to your care team as soon as possible: Allergic reactions--skin rash, itching, hives, swelling of the face, lips, tongue, or throat Side effects that usually do not require medical attention (report to your care team if they continue or are bothersome): Drowsiness Dry mouth Fatigue Nausea This list may not describe all possible side effects. Call your doctor for medical advice about side effects. You may report side effects to FDA at 1-800-FDA-1088. Where should I keep my medication? Keep out of the reach of children and pets. Store between 15 and 30 degrees C (59 and 86 degrees F). Get rid of any unused medication after the expiration date. To get rid of medications that are no longer needed or have expired: Take the medication to a medication take-back program. Check with your pharmacy or law enforcement to find a location. If you cannot return the medication, check  the label or package insert to see if the medication should be thrown out in the garbage or flushed down the toilet. If you  are not sure, ask your care team. If it is safe to put it in the trash, pour the medication out of the container. Mix the medication with cat litter, dirt, coffee grounds, or other unwanted substance. Seal the mixture in a bag or container. Put it in the trash. NOTE: This sheet is a summary. It may not cover all possible information. If you have questions about this medicine, talk to your doctor, pharmacist, or health care provider.  2024 Elsevier/Gold Standard (2021-02-20 00:00:00) Rimegepant Disintegrating Tablets What is this medication? RIMEGEPANT (ri ME je pant) prevents and treats migraines. It works by blocking a substance in the body that causes migraines. This medicine may be used for other purposes; ask your health care provider or pharmacist if you have questions. COMMON BRAND NAME(S): NURTEC ODT What should I tell my care team before I take this medication? They need to know if you have any of these conditions: Kidney disease Liver disease An unusual or allergic reaction to rimegepant, other medications, foods, dyes, or preservatives Pregnant or trying to get pregnant Breast-feeding How should I use this medication? Take this medication by mouth. Take it as directed on the prescription label. Leave the tablet in the sealed pack until you are ready to take it. With dry hands, open the pack and gently remove the tablet. If the tablet breaks or crumbles, throw it away. Use a new tablet. Place the tablet in the mouth and allow it to dissolve. Then, swallow it. Do not cut, crush, or chew this medication. You do not need water to take this medication. Talk to your care team about the use of this medication in children. Special care may be needed. Overdosage: If you think you have taken too much of this medicine contact a poison control center or emergency room at once. NOTE: This medicine is only for you. Do not share this medicine with others. What if I miss a dose? This does not apply.  This medication is not for regular use. What may interact with this medication? Certain medications for fungal infections, such as fluconazole, itraconazole Rifampin This list may not describe all possible interactions. Give your health care provider a list of all the medicines, herbs, non-prescription drugs, or dietary supplements you use. Also tell them if you smoke, drink alcohol, or use illegal drugs. Some items may interact with your medicine. What should I watch for while using this medication? Visit your care team for regular checks on your progress. Tell your care team if your symptoms do not start to get better or if they get worse. What side effects may I notice from receiving this medication? Side effects that you should report to your care team as soon as possible: Allergic reactions--skin rash, itching, hives, swelling of the face, lips, tongue, or throat Side effects that usually do not require medical attention (report to your care team if they continue or are bothersome): Nausea Stomach pain This list may not describe all possible side effects. Call your doctor for medical advice about side effects. You may report side effects to FDA at 1-800-FDA-1088. Where should I keep my medication? Keep out of the reach of children and pets. Store at room temperature between 20 and 25 degrees C (68 and 77 degrees F). Get rid of any unused medication after the expiration date. To get rid of medications  that are no longer needed or have expired: Take the medication to a medication take-back program. Check with your pharmacy or law enforcement to find a location. If you cannot return the medication, check the label or package insert to see if the medication should be thrown out in the garbage or flushed down the toilet. If you are not sure, ask your care team. If it is safe to put it in the trash, take the medication out of the container. Mix the medication with cat litter, dirt, coffee grounds,  or other unwanted substance. Seal the mixture in a bag or container. Put it in the trash. NOTE: This sheet is a summary. It may not cover all possible information. If you have questions about this medicine, talk to your doctor, pharmacist, or health care provider.  2024 Elsevier/Gold Standard (2021-02-18 00:00:00) Vernell Barrier Injection What is this medication? FREMANEZUMAB (fre ma NEZ ue mab) prevents migraines. It works by blocking a substance in the body that causes migraines. It is a monoclonal antibody. This medicine may be used for other purposes; ask your health care provider or pharmacist if you have questions. COMMON BRAND NAME(S): AJOVY What should I tell my care team before I take this medication? They need to know if you have any of these conditions: An unusual or allergic reaction to fremanezumab, other medications, foods, dyes, or preservatives Pregnant or trying to get pregnant Breast-feeding How should I use this medication? This medication is injected under the skin. You will be taught how to prepare and give it. Take it as directed on the prescription label. Keep taking it unless your care team tells you to stop. It is important that you put your used needles and syringes in a special sharps container. Do not put them in a trash can. If you do not have a sharps container, call your pharmacist or care team to get one. Talk to your care team about the use of this medication in children. Special care may be needed. Overdosage: If you think you have taken too much of this medicine contact a poison control center or emergency room at once. NOTE: This medicine is only for you. Do not share this medicine with others. What if I miss a dose? If you miss a dose, take it as soon as you can. If it is almost time for your next dose, take only that dose. Do not take double or extra doses. What may interact with this medication? Interactions are not expected. This list may not describe all  possible interactions. Give your health care provider a list of all the medicines, herbs, non-prescription drugs, or dietary supplements you use. Also tell them if you smoke, drink alcohol, or use illegal drugs. Some items may interact with your medicine. What should I watch for while using this medication? Tell your care team if your symptoms do not start to get better or if they get worse. What side effects may I notice from receiving this medication? Side effects that you should report to your care team as soon as possible: Allergic reactions or angioedema--skin rash, itching or hives, swelling of the face, eyes, lips, tongue, arms, or legs, trouble swallowing or breathing Side effects that usually do not require medical attention (report to your care team if they continue or are bothersome): Pain, redness, or irritation at injection site This list may not describe all possible side effects. Call your doctor for medical advice about side effects. You may report side effects to FDA at 1-800-FDA-1088.  Where should I keep my medication? Keep out of the reach of children and pets. Store in a refrigerator or at room temperature between 20 and 25 degrees C (68 and 77 degrees F). Refrigeration (preferred): Store in the refrigerator. Do not freeze. Keep in the original container until you are ready to take it. Remove the dose from the carton about 30 minutes before it is time for you to use it. If the dose is not used, it may be stored in the original container at room temperature for 7 days. Get rid of any unused medication after the expiration date. Room Temperature: This medication may be stored at room temperature for up to 7 days. Keep it in the original container. Protect from light until time of use. If it is stored at room temperature, get rid of any unused medication after 7 days or after it expires, whichever is first. To get rid of medications that are no longer needed or have expired: Take the  medication to a medication take-back program. Check with your pharmacy or law enforcement to find a location. If you cannot return the medication, ask your pharmacist or care team how to get rid of this medication safely. NOTE: This sheet is a summary. It may not cover all possible information. If you have questions about this medicine, talk to your doctor, pharmacist, or health care provider.  2024 Elsevier/Gold Standard (2021-02-20 00:00:00)

## 2022-12-29 NOTE — Progress Notes (Unsigned)
[facility]    Provider:  Dr Lucia Gaskins Requesting Provider: Lenord Fellers Luanna Cole, MD Primary Care Provider:  Margaree Mackintosh, MD  CC:  Migraines  Virtual Visit via Telephone Note  I connected with Dana Washington on 12/30/22 at  1:00 PM EST by telephone and verified that I am speaking with the correct person using two identifiers.  Location: Patient: home Provider: work   I discussed the limitations, risks, security and privacy concerns of performing an evaluation and management service by telephone and the availability of in person appointments. I also discussed with the patient that there may be a patient responsible charge related to this service. The patient expressed understanding and agreed to proceed.    Follow Up Instructions:    I discussed the assessment and treatment plan with the patient. The patient was provided an opportunity to ask questions and all were answered. The patient agreed with the plan and demonstrated an understanding of the instructions.   The patient was advised to call back or seek an in-person evaluation if the symptoms worsen or if the condition fails to improve as anticipated.  I provided 30 minutes of non-face-to-face time during this encounter.   Anson Fret, MD   12/29/2022: She starts with a headache randomly bc of dehydration maybe or weather. Then it can turn into a migraine. Excedrin helps but she may proceed to nausea and vomiting.  She has 10 migraines a month. Manager of non-invasive now. Nurtec helps but she is running after 10 migraine days a month and a total of >15 total headache days a month for the last 6 months.   HPI 12/23/2021:  Dana Washington is a 47 y.o. female here as requested by Margaree Mackintosh, MD for migraines. Has had them for about 20 years. She saw a neurologist in [place] over 11 years ago. A few days before the migraine blurry vision, feel like you need reading glasses, go on a few days and  then get a migraine. Her whole entire head is ready to explode, irritability, everything bothers her, nausea, vomiting, photophobia, pulsating/pounding/throbbing, she usually so severe she cries. In thepast she used Vicodin and sleep it off. She gets 1-2 migraines a year. She tried fioricet and it never worked well she got 30 pills in Jan 2021 and lasted her 3 years. Feels like migraines are actually becoming less frequent. No new symptoms. Usually weather changes. Now with hormones may be helping her with pre-menopause. Oxycodone does not work. No other focal neurologic deficits, associated symptoms, inciting events or modifiable factors.  Reviewed notes, labs and imaging from outside physicians, which showed:     Latest Ref Rng & Units 11/03/2021    9:17 AM 05/04/2021    7:42 AM 02/08/2021   12:44 PM  CBC  WBC 3.8 - 10.8 Thousand/uL 6.3  6.6  8.0   Hemoglobin 11.7 - 15.5 g/dL 16.1  09.6  04.5   Hematocrit 35.0 - 45.0 % 47.9  47.5  45.4   Platelets 140 - 400 Thousand/uL 395  325  416        Latest Ref Rng & Units 11/03/2021    9:17 AM 05/04/2021    7:42 AM 02/08/2021   12:44 PM  CMP  Glucose 65 - 99 mg/dL 95  94  409   BUN 7 - 25 mg/dL 13  17  12    Creatinine 0.50 - 0.99 mg/dL 8.11  9.14  7.82   Sodium 135 - 146  mmol/L 140  141  136   Potassium 3.5 - 5.3 mmol/L 4.8  4.9  2.8   Chloride 98 - 110 mmol/L 104  104  98   CO2 20 - 32 mmol/L 25  24  25    Calcium 8.6 - 10.2 mg/dL 9.8  9.2  9.3   Total Protein 6.1 - 8.1 g/dL 7.6  7.0  7.6   Total Bilirubin 0.2 - 1.2 mg/dL 0.3  0.3  0.4   Alkaline Phos 44 - 121 IU/L  82  54   AST 10 - 35 U/L 17  18  23    ALT 6 - 29 U/L 16  15  19       Review of Systems: Patient complains of symptoms per HPI as well as the following symptoms stable migraines. Pertinent negatives and positives per HPI. All others negative.   Social History   Socioeconomic History   Marital status: Single    Spouse name: n/a   Number of children: 0   Years of education:  Not on file   Highest education level: Not on file  Occupational History   Occupation: Pediatric ECHO sonographer    Employer: [employer]  Tobacco Use   Smoking status: Never   Smokeless tobacco: Never  Vaping Use   Vaping status: Never Used  Substance and Sexual Activity   Alcohol use: Yes    Comment: social   Drug use: No   Sexual activity: Not Currently    Birth control/protection: Pill  Other Topics Concern   Not on file  Social History Narrative   Lived alone until her mother moved in (retired and moved from [place]).   Her sister and father also live locally.   Caffeine: 2 cups coffee/day   Social Drivers of Corporate investment banker Strain: Not on file  Food Insecurity: Not on file  Transportation Needs: Not on file  Physical Activity: Not on file  Stress: Not on file  Social Connections: Not on file  Intimate Partner Violence: Not on file    Family History  Problem Relation Age of Onset   High Cholesterol Mother    Obesity Mother    Thyroid disease Mother    High Cholesterol Father    Cancer Father    Obesity Father    ADD / ADHD Sister    Migraines Sister    Heart disease Maternal Grandfather    Colon cancer Maternal Grandfather        ds in his late 44's   Heart disease Paternal Grandmother    Esophageal cancer Other        mat great aunt, non-smoker   ADD / ADHD Niece    Rectal cancer Neg Hx    Stomach cancer Neg Hx    Colon polyps Neg Hx     Past Medical History:  Diagnosis Date   Allergy    Anxiety    Colon polyps    Constipation    Diabetes mellitus without complication (HCC)    Fundic gland polyps of stomach, benign    GERD (gastroesophageal reflux disease)    Glucose intolerance (impaired glucose tolerance)    Herpes dermatitis    History of motion sickness    Hx of Hashimoto thyroiditis    Hx of sessile serrated colonic polyps 03/04/2021   Hyperlipidemia    Hypertension    Hypothyroidism, secondary    Impingement syndrome  of right shoulder    Insomnia    Metabolic syndrome  Migraine    OA (osteoarthritis)    RIGHT SHOULDER AC JOINT   Obese    OCD (obsessive compulsive disorder)    PONV (postoperative nausea and vomiting)    SEVERE after breast reduction   TMJ (dislocation of temporomandibular joint)    Vitamin D deficiency    Vitamin D deficiency     Patient Active Problem List   Diagnosis Date Noted   Arterial hypotension 11/24/2022   BMI 35.0-35.9,adult 10/27/2022   Obesity with beginning BMI of 45 10/27/2022   Stress 04/29/2022   BMI 38.0-38.9,adult 03/10/2022   Depression 03/10/2022   Polyphagia 03/10/2022   Other constipation 02/03/2022   Controlled type 2 diabetes mellitus with complication, without long-term current use of insulin (HCC) 02/03/2022   BMI 40.0-44.9, adult (HCC) 02/03/2022   Chronic migraine without aura without status migrainosus, not intractable 12/23/2021   Other hyperlipidemia 06/01/2021   Essential hypertension 06/01/2021   Hx of sessile serrated colonic polyps 03/04/2021   Prediabetes 08/28/2020   S/P left knee arthroscopy 10/25/2017   Knee pain 10/03/2017   Hashimoto's thyroiditis 08/05/2017   Vitamin D deficiency 04/11/2015   OCD (obsessive compulsive disorder) 07/09/2013   Anxiety state 07/09/2013   Hypertension 07/10/2011   Depression with anxiety 06/11/2011   History of migraine headaches 04/08/2011   Hyperlipidemia 01/07/2011   Impaired glucose tolerance 01/07/2011   Generalized obesity 01/07/2011   Hypothyroidism 01/07/2011    Past Surgical History:  Procedure Laterality Date   biopsy of lymph node  FEB 2010   BENIGN   BREAST BIOPSY Bilateral    BREAST EXCISIONAL BIOPSY Left    benign   BREAST REDUCTION SURGERY Bilateral MAY 2010   CARPAL TUNNEL RELEASE Bilateral 2012   CHONDROPLASTY Left 10/25/2017   Procedure: CHONDROPLASTY;  Surgeon: Eugenia Mcalpine, MD;  Location[facility]  Service: Orthopedics;  Laterality: Left;    EXCISION MASS ABDOMINAL N/A 04/14/2017   Procedure: EXCISION OF SUBCUTANEOUS LIPOMA OF LEFT UPPER ABDOMINAL WALL;  Surgeon: Manus Rudd, MD;  Location: [facility];  Service: General;  Laterality: N/A;   KNEE ARTHROSCOPY WITH MEDIAL MENISECTOMY Left 10/25/2017   Procedure: LEFT KNEE ARTHROSCOPY WITH MEDIAL AND LATERAL MENISECTOMY;  Surgeon: Eugenia Mcalpine, MD;  Location: [facility];  Service: Orthopedics;  Laterality: Left;   LASIK  2008   REDUCTION MAMMAPLASTY Bilateral    SHOULDER ARTHROSCOPY WITH ROTATOR CUFF REPAIR AND SUBACROMIAL DECOMPRESSION Right 09/19/2012   Procedure: RIGHT SHOULDER EXAMINE UNDER ANESTHESIA, ARTHROSCOPY,  DEBRIDEMENT, SUBACROMIAL DECOMPRESSION, DISTAL CLAVICLE RESECTION AND  ROTATOR CUFF REPAIR, LABRAL REPAIR ;  Surgeon: Eugenia Mcalpine, MD;  Location: [facility];  Service: Orthopedics;  Laterality: Right;   WISDOM TOOTH EXTRACTION  AGE 52    Current Outpatient Medications  Medication Sig Dispense Refill   Fremanezumab-vfrm (AJOVY) 225 MG/1.5ML SOAJ Inject 225 mg into the skin every 30 (thirty) days. Please use copay card: BIN# [id number] PCN# [id number] GRP# [id number] ID# [id number] 1.5 mL 11   Rimegepant Sulfate (NURTEC) 75 MG TBDP Take 1 tablet (75 mg total) by mouth daily as needed. For migraines. Take as close to onset of migraine as possible. One daily maximum. 4 tablet 0   ALPRAZolam (XANAX) 0.5 MG tablet Take 1 tablet (0.5 mg total) by mouth 2 (two) times daily as needed for anxiety. 60 tablet 1   benazepril-hydrochlorthiazide (LOTENSIN HCT) 20-12.5 MG tablet Take 1 tablet by mouth daily. 90 tablet 1   cholecalciferol (VITAMIN D3) 25 MCG (1000 UNIT) tablet Take  1,000 Units by mouth daily.     Fremanezumab-vfrm (AJOVY) 225 MG/1.5ML SOAJ Inject 225 mg into the skin every 30 (thirty) days. 1.5 mL 0   norethindrone-ethinyl estradiol-iron (BLISOVI FE 1.5/30) 1.5-30 MG-MCG tablet Take 1 tablet by mouth daily. 84 tablet 4   omeprazole  (PRILOSEC) 20 MG capsule Take 1 capsule (20 mg total) by mouth daily. 90 capsule 3   polyethylene glycol powder (GLYCOLAX/MIRALAX) 17 GM/SCOOP powder Take 17 g by mouth 2 (two) times daily as needed. 3350 g 1   rosuvastatin (CRESTOR) 20 MG tablet Take 1 tablet (20 mg total) by mouth daily. 90 tablet 3   SYNTHROID 100 MCG tablet TAKE 1 TABLET (100 MCG TOTAL) DAILY. 90 tablet 1   tirzepatide (ZEPBOUND) 10 MG/0.5ML Pen Inject 10 mg into the skin once a week. 2 mL 0   topiramate (TOPAMAX) 100 MG tablet Take 1 tablet (100 mg total) by mouth at bedtime. 90 tablet 2   Ubrogepant (UBRELVY) 100 MG TABS Take 1 tablet (100 mg total) by mouth every 2 (two) hours as needed. Maximum 200mg  a day. 6 tablet 0   Vilazodone HCl (VIIBRYD) 40 MG TABS Take 1 tablet (40 mg total) by mouth daily. 90 tablet 1   No current facility-administered medications for this visit.    Allergies as of 12/29/2022 - Review Complete 12/13/2022  Allergen Reaction Noted   Bee venom Hives 08/17/2011    Vitals: There were no vitals taken for this visit. Last Weight:  Wt Readings from Last 1 Encounters:  12/30/22 214 lb (97.1 kg)   Last Height:   Ht Readings from Last 1 Encounters:  12/30/22 5\' 6"  (1.676 m)    Physical exam: Exam: Gen: NAD, conversant      CV: No palpitations or chest pain or SOB. VS: Breathing at a normal rate. Not febrile. Eyes: Conjunctivae clear without exudates or hemorrhage  Neuro: Detailed Neurologic Exam  Speech:    Speech is normal; fluent and spontaneous with normal comprehension.  Cognition:    The patient is oriented to person, place, and time;     recent and remote memory intact;     language fluent;     normal attention, concentration, fund of knowledge Cranial Nerves:    The pupils are equal, round, and reactive to light. Visual fields are full Extraocular movements are intact.  The face is symmetric with normal sensation. The palate elevates in the midline. Hearing intact. Voice is  normal. Shoulder shrug is normal. The tongue has normal motion without fasciculations.   Coordination: normal  Gait:    No abnormalities noted or reported  Motor Observation:   no involuntary movements noted. Tone:    Appears normal  Posture:    Posture is normal. normal erect    Strength:    Strength is anti-gravity and symmetric in the upper and lower limbs.      Sensation: intact to LT, no reports of numbness or tingling or paresthesias         Assessment/Plan:  chronic migraines  Inject Ajovy once monthly  Ubrelvy or nurtec prn  And if Vicodin in the past has helped, you are comfortable with it, don't abuse it and use it 1-2x a year I will provide that limited   Meds ordered this encounter  Medications   DISCONTD: Fremanezumab-vfrm (AJOVY) 225 MG/1.5ML SOAJ    Sig: Inject 225 mg into the skin every 30 (thirty) days.    Dispense:  1.5 mL    Refill:  0   DISCONTD: Ubrogepant (UBRELVY) 100 MG TABS    Sig: Take 1 tablet (100 mg total) by mouth every 2 (two) hours as needed. Maximum 200mg  a day.    Dispense:  6 tablet    Refill:  0   DISCONTD: Rimegepant Sulfate (NURTEC) 75 MG TBDP    Sig: Take 1 tablet (75 mg total) by mouth daily as needed. For migraines. Take as close to onset of migraine as possible. One daily maximum.    Dispense:  4 tablet    Refill:  0   Fremanezumab-vfrm (AJOVY) 225 MG/1.5ML SOAJ    Sig: Inject 225 mg into the skin every 30 (thirty) days.    Dispense:  1.5 mL    Refill:  0   Ubrogepant (UBRELVY) 100 MG TABS    Sig: Take 1 tablet (100 mg total) by mouth every 2 (two) hours as needed. Maximum 200mg  a day.    Dispense:  6 tablet    Refill:  0   Rimegepant Sulfate (NURTEC) 75 MG TBDP    Sig: Take 1 tablet (75 mg total) by mouth daily as needed. For migraines. Take as close to onset of migraine as possible. One daily maximum.    Dispense:  4 tablet    Refill:  0   Fremanezumab-vfrm (AJOVY) 225 MG/1.5ML SOAJ    Sig: Inject 225 mg into the  skin every 30 (thirty) days. Please use copay card: BIN# [id number] PCN# [id number] GRP# [id number] ID# [id number]    Dispense:  1.5 mL    Refill:  11    Please use copay card: BIN# [id number] PCN# [id number] GRP# [id number] ID# [id number]    Cc: Margaree Mackintosh, MD,  Baxley, Luanna Cole, MD  Naomie Dean, MD  [facility] [address] [place], [place] [address]  Phone [phone] Fax [phone]

## 2022-12-29 NOTE — Telephone Encounter (Signed)
Please call patient for 6 month follow video dr Lucia Gaskins also can make when she comes in for samples today

## 2022-12-29 NOTE — Telephone Encounter (Signed)
LVM asking pt to call back to schedule follow up with Dr. Lucia Gaskins

## 2022-12-30 ENCOUNTER — Encounter: Payer: Self-pay | Admitting: Neurology

## 2022-12-30 ENCOUNTER — Encounter (INDEPENDENT_AMBULATORY_CARE_PROVIDER_SITE_OTHER): Payer: Self-pay | Admitting: Family Medicine

## 2022-12-30 ENCOUNTER — Ambulatory Visit (INDEPENDENT_AMBULATORY_CARE_PROVIDER_SITE_OTHER): Payer: 59 | Admitting: Family Medicine

## 2022-12-30 ENCOUNTER — Other Ambulatory Visit (HOSPITAL_BASED_OUTPATIENT_CLINIC_OR_DEPARTMENT_OTHER): Payer: Self-pay

## 2022-12-30 VITALS — BP 113/74 | HR 78 | Temp 98.1°F | Ht 66.0 in | Wt 214.0 lb

## 2022-12-30 DIAGNOSIS — R7303 Prediabetes: Secondary | ICD-10-CM | POA: Diagnosis not present

## 2022-12-30 DIAGNOSIS — E559 Vitamin D deficiency, unspecified: Secondary | ICD-10-CM

## 2022-12-30 DIAGNOSIS — E669 Obesity, unspecified: Secondary | ICD-10-CM | POA: Diagnosis not present

## 2022-12-30 DIAGNOSIS — E785 Hyperlipidemia, unspecified: Secondary | ICD-10-CM

## 2022-12-30 DIAGNOSIS — G43009 Migraine without aura, not intractable, without status migrainosus: Secondary | ICD-10-CM | POA: Diagnosis not present

## 2022-12-30 DIAGNOSIS — Z6834 Body mass index (BMI) 34.0-34.9, adult: Secondary | ICD-10-CM | POA: Diagnosis not present

## 2022-12-30 DIAGNOSIS — E7849 Other hyperlipidemia: Secondary | ICD-10-CM | POA: Diagnosis not present

## 2022-12-30 DIAGNOSIS — R632 Polyphagia: Secondary | ICD-10-CM | POA: Diagnosis not present

## 2022-12-30 MED ORDER — AJOVY 225 MG/1.5ML ~~LOC~~ SOAJ
225.0000 mg | SUBCUTANEOUS | 11 refills | Status: AC
  Filled 2022-12-30 – 2023-01-03 (×2): qty 1.5, 30d supply, fill #0
  Filled 2023-01-27: qty 1.5, 30d supply, fill #1
  Filled 2023-03-02: qty 1.5, 30d supply, fill #2
  Filled 2023-03-30 – 2023-03-31 (×2): qty 1.5, 30d supply, fill #3
  Filled 2023-04-30: qty 1.5, 30d supply, fill #4
  Filled 2023-05-28: qty 1.5, 30d supply, fill #5
  Filled 2023-06-27: qty 1.5, 30d supply, fill #6
  Filled 2023-07-24: qty 1.5, 30d supply, fill #7
  Filled 2023-08-29 – 2023-09-27 (×2): qty 1.5, 30d supply, fill #8
  Filled 2023-10-27: qty 1.5, 30d supply, fill #9
  Filled 2023-12-23: qty 1.5, 30d supply, fill #0

## 2022-12-30 MED ORDER — ZEPBOUND 10 MG/0.5ML ~~LOC~~ SOAJ
10.0000 mg | SUBCUTANEOUS | 0 refills | Status: DC
  Filled 2022-12-30: qty 2, 28d supply, fill #0

## 2022-12-30 NOTE — Progress Notes (Signed)
.smr  Office: [phone]  /  Fax: [phone]  WEIGHT SUMMARY AND BIOMETRICS  Anthropometric Measurements Height: 5\' 6"  (1.676 m) Weight: 214 lb (97.1 kg) BMI (Calculated): 34.56 Weight at Last Visit: 216 lb Weight Lost Since Last Visit: 2 lb Weight Gained Since Last Visit: 0 Starting Weight: 273 lb Total Weight Loss (lbs): 59 lb (26.8 kg)   Body Composition  Body Fat %: 41.1 % Fat Mass (lbs): 88.2 lbs Muscle Mass (lbs): 120 lbs Total Body Water (lbs): 85.8 lbs Visceral Fat Rating : 10   Other Clinical Data Fasting: Yes Labs: Yes Today's Visit #: 31 Starting Date: 08/27/20    Chief Complaint: OBESITY   History of Present Illness   The patient, with a history of prediabetes, hyperlipidemia, and vitamin D deficiency, has been actively working on weight loss through diet and exercise. She is on Zepbound for polyphasia and Crestor for cholesterol control. Over the past month, including the Thanksgiving period, she has lost two pounds and reports adhering to her category two eating plan about 85% of the time. She exercises five days a week, alternating between treadmill and bicycle for 30-45 minutes each session.  However, the patient recently experienced a setback due to a norovirus infection, which resulted in a week-long period of not being able to ingest any food. She expressed concern about the impact of this on her protein intake and hydration.  The patient also discussed challenges with her mother's cooking methods, particularly the use of oil and the balance of protein in meals. She has been trying to adapt to these meals while maintaining her dietary plan.  In addition to her weight loss efforts, the patient has been managing migraines. She reports having a constant headache, which she had not previously identified as a migraine. She has been taking Zepbound for this condition.  The patient's progress has been somewhat overshadowed by comparisons with a colleague  who has been losing weight while maintaining a less strict diet. Despite this, the patient remains committed to her health goals.          PHYSICAL EXAM:  Blood pressure 113/74, pulse 78, temperature 98.1 F (36.7 C), height 5\' 6"  (1.676 m), weight 214 lb (97.1 kg), SpO2 100%. Body mass index is 34.54 kg/m.  DIAGNOSTIC DATA REVIEWED:  BMET    Component Value Date/Time   NA 140 11/03/2021 0917   NA 141 05/04/2021 0742   K 4.8 11/03/2021 0917   CL 104 11/03/2021 0917   CO2 25 11/03/2021 0917   GLUCOSE 95 11/03/2021 0917   BUN 13 11/03/2021 0917   BUN 17 05/04/2021 0742   CREATININE 0.88 11/03/2021 0917   CALCIUM 9.8 11/03/2021 0917   GFRNONAA >60 02/08/2021 1244   GFRNONAA 85 07/18/2020 0937   GFRAA 98 07/18/2020 0937   Lab Results  Component Value Date   HGBA1C 5.6 11/03/2021   HGBA1C 5.9 (H) 01/07/2011   Lab Results  Component Value Date   INSULIN 22.3 05/04/2021   INSULIN 28.0 (H) 08/27/2020   Lab Results  Component Value Date   TSH 0.72 11/03/2021   CBC    Component Value Date/Time   WBC 6.3 11/03/2021 0917   RBC 5.22 (H) 11/03/2021 0917   HGB 16.2 (H) 11/03/2021 0917   HGB 15.6 05/04/2021 0742   HCT 47.9 (H) 11/03/2021 0917   HCT 47.5 (H) 05/04/2021 0742   PLT 395 11/03/2021 0917   PLT 325 05/04/2021 0742   MCV 91.8 11/03/2021 0917   MCV  93 05/04/2021 0742   MCH 31.0 11/03/2021 0917   MCHC 33.8 11/03/2021 0917   RDW 13.0 11/03/2021 0917   RDW 13.9 05/04/2021 0742   Iron Studies No results found for: "IRON", "TIBC", "FERRITIN", "IRONPCTSAT" Lipid Panel     Component Value Date/Time   CHOL 157 11/03/2021 0917   CHOL 158 05/04/2021 0742   TRIG 91 11/03/2021 0917   HDL 63 11/03/2021 0917   HDL 54 05/04/2021 0742   CHOLHDL 2.5 11/03/2021 0917   VLDL 29 03/29/2016 1224   LDLCALC 77 11/03/2021 0917   Hepatic Function Panel     Component Value Date/Time   PROT 7.6 11/03/2021 0917   PROT 7.0 05/04/2021 0742   ALBUMIN 4.4 05/04/2021 0742    AST 17 11/03/2021 0917   ALT 16 11/03/2021 0917   ALKPHOS 82 05/04/2021 0742   BILITOT 0.3 11/03/2021 0917   BILITOT 0.3 05/04/2021 0742   BILIDIR 0.1 09/27/2016 0941   IBILI 0.2 09/27/2016 0941      Component Value Date/Time   TSH 0.72 11/03/2021 0917   Nutritional Lab Results  Component Value Date   VD25OH 65.5 05/04/2021   VD25OH 45.7 08/27/2020   VD25OH 32 06/19/2019     Assessment and Plan    Obesity  Actively working on weight loss through diet and exercise. Lost two pounds in the last month despite a setback due to norovirus. Following a category two eating plan 85% of the time and exercising five days a week. Emphasized maintaining protein intake and hydration, especially post-illness, to avoid muscle mass loss and metabolic issues. - Continue Zepbound for polyphagia - Continue diet and exercise regimen - Verify January and February appointments  Hyperlipidemia On Crestor with good recent lab results for triglycerides and LDL. Discussed the importance of continued adherence to diet and exercise to maintain lipid levels. - Check labs including liver function, electrolytes, triglycerides, LDL, and vitamin D  Prediabetes Managing prediabetes with diet and exercise. Emphasized regular monitoring and lifestyle modifications to prevent progression to diabetes. - Check A1c and insulin levels  Chronic Migraines Experiencing constant mild headaches and severe incapacitating migraines. Currently managing with Excedrin but may need better control. Discussed the possibility that constant mild headaches may be early stages of migraines and the need for improved management. - Follow up with neurologist Dr. Lucia Gaskins for migraine management  General Health Maintenance Routine health maintenance including lab checks and lifestyle modifications. Discussed the importance of regular monitoring of thyroid function and vitamin D levels. - Check thyroid function - Continue current diet and  exercise regimen  Follow-up - Verify January and February appointments - Check labs as discussed.          She was informed of the importance of frequent follow up visits to maximize her success with intensive lifestyle modifications for her multiple health conditions.    Quillian Quince, MD

## 2022-12-31 ENCOUNTER — Other Ambulatory Visit: Payer: Self-pay

## 2023-01-01 LAB — CMP14+EGFR
ALT: 16 [IU]/L (ref 0–32)
AST: 16 [IU]/L (ref 0–40)
Albumin: 4.5 g/dL (ref 3.9–4.9)
Alkaline Phosphatase: 71 [IU]/L (ref 44–121)
BUN/Creatinine Ratio: 11 (ref 9–23)
BUN: 10 mg/dL (ref 6–24)
Bilirubin Total: 0.4 mg/dL (ref 0.0–1.2)
CO2: 20 mmol/L (ref 20–29)
Calcium: 9.6 mg/dL (ref 8.7–10.2)
Chloride: 103 mmol/L (ref 96–106)
Creatinine, Ser: 0.95 mg/dL (ref 0.57–1.00)
Globulin, Total: 2.5 g/dL (ref 1.5–4.5)
Glucose: 82 mg/dL (ref 70–99)
Potassium: 4.9 mmol/L (ref 3.5–5.2)
Sodium: 140 mmol/L (ref 134–144)
Total Protein: 7 g/dL (ref 6.0–8.5)
eGFR: 73 mL/min/{1.73_m2} (ref >59)

## 2023-01-01 LAB — LIPID PANEL WITH LDL/HDL RATIO
Cholesterol, Total: 167 mg/dL (ref 100–199)
HDL: 50 mg/dL (ref >39)
LDL Chol Calc (NIH): 96 mg/dL (ref 0–99)
LDL/HDL Ratio: 1.9 {ratio} (ref 0.0–3.2)
Triglycerides: 117 mg/dL (ref 0–149)
VLDL Cholesterol Cal: 21 mg/dL (ref 5–40)

## 2023-01-01 LAB — INSULIN, RANDOM: INSULIN: 13.5 u[IU]/mL (ref 2.6–24.9)

## 2023-01-01 LAB — HEMOGLOBIN A1C
Est. average glucose Bld gHb Est-mCnc: 111 mg/dL
Hgb A1c MFr Bld: 5.5 % (ref 4.8–5.6)

## 2023-01-01 LAB — VITAMIN D 25 HYDROXY (VIT D DEFICIENCY, FRACTURES): Vit D, 25-Hydroxy: 64.9 ng/mL (ref 30.0–100.0)

## 2023-01-01 LAB — VITAMIN B12: Vitamin B-12: 615 pg/mL (ref 232–1245)

## 2023-01-01 LAB — TSH: TSH: 0.794 u[IU]/mL (ref 0.450–4.500)

## 2023-01-03 ENCOUNTER — Other Ambulatory Visit (HOSPITAL_BASED_OUTPATIENT_CLINIC_OR_DEPARTMENT_OTHER): Payer: Self-pay

## 2023-01-04 ENCOUNTER — Other Ambulatory Visit: Payer: Self-pay

## 2023-01-13 ENCOUNTER — Other Ambulatory Visit: Payer: Self-pay | Admitting: Internal Medicine

## 2023-01-13 ENCOUNTER — Ambulatory Visit: Payer: 59 | Admitting: Psychiatry

## 2023-01-13 DIAGNOSIS — Z1231 Encounter for screening mammogram for malignant neoplasm of breast: Secondary | ICD-10-CM

## 2023-01-17 ENCOUNTER — Ambulatory Visit: Payer: 59 | Admitting: Psychiatry

## 2023-01-17 DIAGNOSIS — E669 Obesity, unspecified: Secondary | ICD-10-CM

## 2023-01-17 DIAGNOSIS — F419 Anxiety disorder, unspecified: Secondary | ICD-10-CM

## 2023-01-17 NOTE — Progress Notes (Signed)
 [facility] Counselor/Therapist Progress Note  Patient ID: Dana Washington, MRN: [id number],    Date: 01/17/2023  Time Spent: 45 minutes start time 4:53 PM end time 5:38 PM Virtual Visit via Video Note Connected with patient by a telemedicine/telehealth application, with their informed consent, and verified patient privacy and that I am speaking with the correct person using two identifiers. I discussed the limitations, risks, security and privacy concerns of performing psychotherapy and the availability of in person appointments. I also discussed with the patient that there may be a patient responsible charge related to this service. The patient expressed understanding and agreed to proceed. I discussed the treatment planning with the patient. The patient was provided an opportunity to ask questions and all were answered. The patient agreed with the plan and demonstrated an understanding of the instructions. The patient was advised to call  our office if  symptoms worsen or feel they are in a crisis state and need immediate contact.   Therapist Location: home Patient Location: home    Treatment Type: Individual Therapy  Reported Symptoms: anxiety, rumination  Mental Status Exam:  Appearance:   Well Groomed     Behavior:  Appropriate  Motor:  Normal  Speech/Language:   Normal Rate  Affect:  Appropriate  Mood:  anxious  Thought process:  normal  Thought content:    WNL  Sensory/Perceptual disturbances:    WNL  Orientation:  oriented to person, place, time/date, and situation  Attention:  Good  Concentration:  Good  Memory:  WNL  Fund of knowledge:   Good  Insight:    Good  Judgment:   Good  Impulse Control:  Good   Risk Assessment: Danger to Self:  No Self-injurious Behavior: No Danger to Others: No Duty to Warn:no Physical Aggression / Violence:No  Access to Firearms a concern: No  Gang Involvement:No   Subjective: Met with patient via virtual session. She  shared that she was doing okay. She explained that she is using her grounding exercise and that is helping her to manage her rumination. She went on to share she is getting ready to go on cruise and she is having anxiety about what will happen while she is gone.  Encouraged her to get back to the facts/truth.  She was able to recognize that she was hired for the position and no one else can do it the way that she does it.  Reminded her to write that down and think about that while she is on her vacation.  She went on to share that she is having people who are starting to share that they appreciate her. She was given a positive card and she is leaving it at her desk to read regularly.  Through thinking about those things she was able to recognize that when she set the limit with the person who was negative and she is no longer working there and has changed everybody's perceptions at work and things are much more positive.  The importance of keeping positive going was discussed with patient.  Patient shared that she is planning on taking her laptop and checking on emails on a vacation.  Encouraged patient to try and disengage and give herself some time just to relax and realize she is in the awful she is on medication.  Patient also shared she has had a huge decrease in crying spells which has helped her mood in a positive manner as well.  At end of session she  shared there are issues with her dad and some of the past regarding when her grandmother passed away and how there were many arguments within his siblings regarding money and that has left to her losing her and uncle and cousins as well as her grandmother which is hard.  Patient agreed she may need to process that at future session.  Interventions: Cognitive Behavioral Therapy and Insight-Oriented  Diagnosis:   ICD-10-CM   1. Generalized obesity  E66.9       Plan: Patient is to use coping skills and CBT skills.  Patient is to follow plans from session  to affirm herself regarding workplace situations  to decrease anxiety symptoms.  Patient is to work on exercising regularly.  Patient is to look at [facility] is the first dose by Dr. Jama Lowers.  Patient is to take medication as directed.   Silvano Pacini, [facility]

## 2023-01-17 NOTE — Telephone Encounter (Signed)
 Copied from CRM [id number]. Topic: Clinical - Medical Advice >> Jan 17, 2023  9:58 AM Carlatta H wrote: Reason for CRM: Patient would like to know what labs will be ordered due to her having labs done on 1/3//Please call

## 2023-01-18 ENCOUNTER — Other Ambulatory Visit: Payer: 59

## 2023-01-18 DIAGNOSIS — Z0000 Encounter for general adult medical examination without abnormal findings: Secondary | ICD-10-CM | POA: Diagnosis not present

## 2023-01-18 LAB — CBC WITH DIFFERENTIAL/PLATELET
Absolute Lymphocytes: 1677 {cells}/uL (ref 850–3900)
Absolute Monocytes: 527 {cells}/uL (ref 200–950)
Basophils Absolute: 57 {cells}/uL (ref 0–200)
Basophils Relative: 0.7 %
Eosinophils Absolute: 73 {cells}/uL (ref 15–500)
Eosinophils Relative: 0.9 %
HCT: 47.4 % — ABNORMAL HIGH (ref 35.0–45.0)
Hemoglobin: 15.5 g/dL (ref 11.7–15.5)
MCH: 30.6 pg (ref 27.0–33.0)
MCHC: 32.7 g/dL (ref 32.0–36.0)
MCV: 93.7 fL (ref 80.0–100.0)
MPV: 9.3 fL (ref 7.5–12.5)
Monocytes Relative: 6.5 %
Neutro Abs: 5767 {cells}/uL (ref 1500–7800)
Neutrophils Relative %: 71.2 %
Platelets: 364 10*3/uL (ref 140–400)
RBC: 5.06 10*6/uL (ref 3.80–5.10)
RDW: 13.1 % (ref 11.0–15.0)
Total Lymphocyte: 20.7 %
WBC: 8.1 10*3/uL (ref 3.8–10.8)

## 2023-01-18 NOTE — Progress Notes (Signed)
 Annual Wellness Visit   Patient Care Team: Dana Ronal PARAS, MD as PCP - General (Internal Medicine)  Visit Date: 01/20/23   Chief Complaint  Patient presents with   Annual Exam   Subjective:  Patient: Dana Washington, Female DOB: 02/20/1975, 48 y.o. MRN: [id number]  Dana Washington is a 48 y.o. Female who presents today for her Annual Wellness Visit. Patient has history of Essential Hypertension, Hyperlipidemia, Obesity, Metabolic Syndrome, Impaired Glucose Tolerance, Migraine Headaches, Hashimoto's Thyroiditis with Resultant Hypothyroidism, GE Reflux, Functional Constipation, and Vitamin-D Deficiency.   History of Hypertension treated with 20-12.5 mg Lotensin  HCT daily  History of Hyperlipidemia treated with 20 mg Rosuvastatin  daily  History of Impaired Glucose Tolerance managed with diet and exercise.12/30/22 HgbA1c 5.5, decreased from 5.6 on 11/03/21. Insulin  13.5, decreased from 22.3 on 05/04/21. Reports she had an eye exam a couple of years ago and was told she didn't need to repeat for another 10 years.  History of Obesity managed with 10 mg Zepbound , diet, and exercise. Followed by Louann Penton, MD with [facility] and Silvano Pacini, [facility] with Behavioral Health. Has lost 30 pounds since last Health Maintenance Exam in this office on 11/06/21.    History of Migraine Headaches treated with 225 mg Ajovy  monthly, 100 mg Ubrelvy  every 2 hours as needed, and 75 mg Nurtec daily as needed  History of Hashimoto's Thyroiditis with Resultant Hypothyroidism treated with 100 mcg Synthroid  daily. 12/30/22 TSH 0.749, slight increase from 0.72 on 11/03/21.   History of GE Reflux treated with 20 mg Prilosec daily.   Labs  CBC: HCT 47.4, otherwise normal.  CMP14 + EGFR: WNL Vitamin-D: WNL at 64.9  Lipid Panel: WNL Vitamin B12: WNL at 615  PAP Smear September 2024 with Ruthellen SHIPPER per patient report though we have no records of this in [place] - will contact  Mammogram  01/27/22 was normal with repeat recommendation of 2025  Colonoscopy 02/27/21 with 3 diminutive polyps in the ascending colon removed, otherwise normal with repeat recommendation of 2028.   Bone DEXA will not be due until 2041.   Vaccine Counseling: UTD on Flu, Tdap, and per patient report Covid-19 at [facility] though no records of this encounter are in [facility]  Past Medical History:  Diagnosis Date   Allergy    Anxiety    Colon polyps    Constipation    Diabetes mellitus without complication (HCC)    Fundic gland polyps of stomach, benign    GERD (gastroesophageal reflux disease)    Glucose intolerance (impaired glucose tolerance)    Herpes dermatitis    History of motion sickness    Hx of Hashimoto thyroiditis    Hx of sessile serrated colonic polyps 03/04/2021   Hyperlipidemia    Hypertension    Hypothyroidism, secondary    Impingement syndrome of right shoulder    Insomnia    Metabolic syndrome    Migraine    OA (osteoarthritis)    RIGHT SHOULDER AC JOINT   Obese    OCD (obsessive compulsive disorder)    PONV (postoperative nausea and vomiting)    SEVERE after breast reduction   TMJ (dislocation of temporomandibular joint)    Vitamin D  deficiency    Vitamin D  deficiency   Past Medical/Surgical History Narrative:  April 2019 - Dr. Belinda removed a Lipoma from Left Upper Abdominal Wall   February 2018 - Urticaria treated with Steroid, Zyrtec, and Zantac .   September 2014 - 20% Rotator Cuff Tear and Osteoarthritis AC  Joint treated surgically by Dr. Gerome with Partial Clavicle Resection.  February 2012 - Bilateral Carpal Tunnel Release  2010 - Benign Lymph Node Biopsy in February.  Breast Reduction surgery with subsequent Seroma of Left Breast.     2008 - LASEK surgery for Myopia.   Other: Glenoid Labral Tear  Family History  Problem Relation Age of Onset   High Cholesterol Mother    Obesity Mother    Thyroid  disease Mother    High Cholesterol Father    Cancer  Father    Obesity Father    ADD / ADHD Sister    Migraines Sister    Heart disease Maternal Grandfather    Colon cancer Maternal Grandfather        ds in his late 78's   Heart disease Paternal Grandmother    Esophageal cancer Other        mat great aunt, non-smoker   ADD / ADHD Niece    Rectal cancer Neg Hx    Stomach cancer Neg Hx    Colon polyps Neg Hx     Social History   Social History Narrative   Lived alone until her mother moved in (retired and moved from [place]).   Her sister and father also live locally.   Caffeine : 2 cups coffee/day   Review of Systems  Constitutional:  Negative for chills, fever, malaise/fatigue and weight loss.  HENT:  Negative for hearing loss, sinus pain and sore throat.   Respiratory:  Negative for cough, hemoptysis and shortness of breath.   Cardiovascular:  Negative for chest pain, palpitations, leg swelling and PND.  Gastrointestinal:  Negative for abdominal pain, constipation, diarrhea, heartburn, nausea and vomiting.  Genitourinary:  Negative for dysuria, frequency and urgency.  Musculoskeletal:  Negative for back pain, myalgias and neck pain.  Skin:  Negative for itching and rash.  Neurological:  Negative for dizziness, tingling, seizures and headaches.  Endo/Heme/Allergies:  Negative for polydipsia.  Psychiatric/Behavioral:  Negative for depression. The patient is not nervous/anxious.     Objective:  Vitals: BP 110/70   Pulse 90   Ht 5' 6 (1.676 m)   Wt 216 lb (98 kg)   LMP 01/18/2023   SpO2 98%   BMI 34.86 kg/m  Physical Exam Vitals and nursing note reviewed.  Constitutional:      General: She is not in acute distress.    Appearance: Normal appearance. She is not ill-appearing or toxic-appearing.  HENT:     Head: Normocephalic and atraumatic.     Right Ear: Hearing, tympanic membrane, ear canal and external ear normal.     Left Ear: Hearing, tympanic membrane, ear canal and external ear normal.     Mouth/Throat:      Pharynx: Oropharynx is clear.  Eyes:     Extraocular Movements: Extraocular movements intact.     Pupils: Pupils are equal, round, and reactive to light.  Neck:     Thyroid : No thyroid  mass, thyromegaly or thyroid  tenderness.     Vascular: No carotid bruit.  Cardiovascular:     Rate and Rhythm: Normal rate and regular rhythm. No extrasystoles are present.    Pulses:          Dorsalis pedis pulses are 1+ on the right side and 1+ on the left side.     Heart sounds: Normal heart sounds. No murmur heard.    No friction rub. No gallop.  Pulmonary:     Effort: Pulmonary effort is normal.     Breath  sounds: Normal breath sounds. No decreased breath sounds, wheezing, rhonchi or rales.  Chest:     Chest wall: No mass.  Breasts:    Right: Normal. No mass.     Left: Normal. No mass.  Abdominal:     Palpations: Abdomen is soft. There is no hepatomegaly, splenomegaly or mass.     Tenderness: There is no abdominal tenderness.     Hernia: No hernia is present.  Musculoskeletal:     Cervical back: Normal range of motion.     Right lower leg: No edema.     Left lower leg: No edema.  Lymphadenopathy:     Cervical: No cervical adenopathy.     Upper Body:     Right upper body: No supraclavicular adenopathy.     Left upper body: No supraclavicular adenopathy.  Skin:    General: Skin is warm and dry.  Neurological:     General: No focal deficit present.     Mental Status: She is alert and oriented to person, place, and time. Mental status is at baseline.     Sensory: Sensation is intact.     Motor: Motor function is intact. No weakness.     Deep Tendon Reflexes: Reflexes are normal and symmetric.  Psychiatric:        Attention and Perception: Attention normal.        Mood and Affect: Mood normal.        Speech: Speech normal.        Behavior: Behavior normal.        Thought Content: Thought content normal.        Cognition and Memory: Cognition normal.        Judgment: Judgment normal.    Most recent fall risk assessment:    03/04/2022    3:45 PM  Fall Risk   Falls in the past year? 0  Number falls in past yr: 0  Injury with Fall? 0  Risk for fall due to : No Fall Risks  Follow up Falls prevention discussed    Most recent depression screenings:    01/20/2023    3:17 PM 03/04/2022    3:45 PM  PHQ 2/9 Scores  PHQ - 2 Score 0 0  PHQ- 9 Score 0    Results:  Studies obtained and personally reviewed by me:  Mammogram 01/27/22 was normal with repeat recommendation of 2025  Colonoscopy 02/27/21 with 3 diminutive polyps in the ascending colon removed, otherwise normal with repeat recommendation of 2028.   Labs:     Component Value Date/Time   NA 140 12/30/2022 0928   K 4.9 12/30/2022 0928   CL 103 12/30/2022 0928   CO2 20 12/30/2022 0928   GLUCOSE 82 12/30/2022 0928   GLUCOSE 95 11/03/2021 0917   BUN 10 12/30/2022 0928   CREATININE 0.95 12/30/2022 0928   CREATININE 0.88 11/03/2021 0917   CALCIUM  9.6 12/30/2022 0928   PROT 7.0 12/30/2022 0928   ALBUMIN 4.5 12/30/2022 0928   AST 16 12/30/2022 0928   ALT 16 12/30/2022 0928   ALKPHOS 71 12/30/2022 0928   BILITOT 0.4 12/30/2022 0928   GFRNONAA >60 02/08/2021 1244   GFRNONAA 85 07/18/2020 0937   GFRAA 98 07/18/2020 0937    Lab Results  Component Value Date   WBC 8.1 01/18/2023   HGB 15.5 01/18/2023   HCT 47.4 (H) 01/18/2023   MCV 93.7 01/18/2023   PLT 364 01/18/2023   Lab Results  Component Value Date   CHOL  167 12/30/2022   HDL 50 12/30/2022   LDLCALC 96 12/30/2022   TRIG 117 12/30/2022   CHOLHDL 2.5 11/03/2021   Lab Results  Component Value Date   HGBA1C 5.5 12/30/2022    Lab Results  Component Value Date   TSH 0.794 12/30/2022   Assessment & Plan:  Labs ordered today: Microalbumin/Creatinine Urine Ratio and POCT Urinalysis Dip  Hypertension: treated with 20-12.5 mg Lotensin  HCT daily  Hyperlipidemia: treated with 20 mg Rosuvastatin  daily  Impaired Glucose Tolerance: managed with diet  and exercise.12/30/22 HgbA1c 5.5, decreased from 5.6 on 11/03/21. Insulin  13.5, decreased from 22.3 on 05/04/21. Reports she had an eye exam a couple of years ago and was told she didn't need to repeat for another 10 years.  Obesity: managed with 10 mg Zepbound , diet, and exercise. Followed by Louann Penton, MD with [facility] and Silvano Pacini, [facility] with Behavioral Health. Has lost 30 pounds since last Health Maintenance Exam in this office on 11/06/21.    Migraine Headaches: treated with 225 mg Ajovy  monthly, 100 mg Ubrelvy  every 2 hours as needed, and 75 mg Nurtec daily as needed  Hashimoto's Thyroiditis with Resultant Hypothyroidism: treated with 100 mcg Synthroid  daily. 12/30/22 TSH 0.749, slight increase from 0.72 on 11/03/21.   GE Reflux: treated with 20 mg Prilosec daily.   PAP Smear: September 2024 with Ruthellen SHIPPER per patient report though we have no records of this in [facility] - will contact to obtain records  Mammogram: 01/27/22 was normal with repeat recommendation of 2025  Colonoscopy: 02/27/21 with 3 diminutive polyps in the ascending colon removed, otherwise normal with repeat recommendation of 2028.   Bone DEXA will not be due until 2041.   Vaccine Counseling: UTD on Flu, Tdap, and per patient report Covid-19-reportedly done at [facility] - will contact for verification    Annual wellness visit done today including the all of the following: Reviewed patient's Family Medical History Reviewed and updated list of patient's medical providers Assessment of cognitive impairment was done Assessed patient's functional ability Established a written schedule for health screening services Health Risk Assessent Completed and Reviewed  Discussed health benefits of physical activity, and encouraged her to engage in regular exercise appropriate for her age and condition.        I,Emily Lagle,acting as a neurosurgeon for Ronal JINNY Hailstone, MD.,have documented all relevant  documentation on the behalf of Ronal JINNY Hailstone, MD,as directed by  Ronal JINNY Hailstone, MD while in the presence of Ronal JINNY Hailstone, MD.   I, Ronal JINNY Hailstone, MD, have reviewed all documentation for this visit. The documentation on 02/05/23 for the exam, diagnosis, procedures, and orders are all accurate and complete.

## 2023-01-19 ENCOUNTER — Encounter (INDEPENDENT_AMBULATORY_CARE_PROVIDER_SITE_OTHER): Payer: Self-pay | Admitting: Family Medicine

## 2023-01-19 ENCOUNTER — Ambulatory Visit (INDEPENDENT_AMBULATORY_CARE_PROVIDER_SITE_OTHER): Payer: 59 | Admitting: Family Medicine

## 2023-01-19 ENCOUNTER — Other Ambulatory Visit (HOSPITAL_BASED_OUTPATIENT_CLINIC_OR_DEPARTMENT_OTHER): Payer: Self-pay

## 2023-01-19 VITALS — BP 110/74 | HR 107 | Temp 98.0°F | Ht 66.0 in | Wt 211.0 lb

## 2023-01-19 DIAGNOSIS — E669 Obesity, unspecified: Secondary | ICD-10-CM

## 2023-01-19 DIAGNOSIS — R632 Polyphagia: Secondary | ICD-10-CM

## 2023-01-19 DIAGNOSIS — F419 Anxiety disorder, unspecified: Secondary | ICD-10-CM | POA: Diagnosis not present

## 2023-01-19 DIAGNOSIS — Z6834 Body mass index (BMI) 34.0-34.9, adult: Secondary | ICD-10-CM

## 2023-01-19 MED ORDER — ZEPBOUND 10 MG/0.5ML ~~LOC~~ SOAJ
10.0000 mg | SUBCUTANEOUS | 0 refills | Status: DC
  Filled 2023-01-19 – 2023-01-28 (×3): qty 2, 28d supply, fill #0

## 2023-01-19 NOTE — Progress Notes (Signed)
 .smr  Office: [phone]  /  Fax: [phone]  WEIGHT SUMMARY AND BIOMETRICS  Anthropometric Measurements Height: 5' 6 (1.676 m) Weight: 211 lb (95.7 kg) BMI (Calculated): 34.07 Weight at Last Visit: 214 lb Weight Lost Since Last Visit: 3 lb Weight Gained Since Last Visit: 0 Starting Weight: 273 lb Total Weight Loss (lbs): 62 lb (28.1 kg)   Body Composition  Body Fat %: 41 % Fat Mass (lbs): 86.8 lbs Muscle Mass (lbs): 118.6 lbs Total Body Water (lbs): 87.2 lbs Visceral Fat Rating : 10   Other Clinical Data Fasting: No Labs: No Today's Visit #: 32 Starting Date: 08/27/20    Chief Complaint: OBESITY   Discussed the use of AI scribe software for clinical note transcription with the patient, who gave verbal consent to proceed.  History of Present Illness   The patient, under the care of behavioral health for anxiety, presents for a follow-up visit to discuss her polyphagia, obesity, and anxiety. She reports adherence to a category two eating plan about 75% of the time and has lost three pounds in the last three weeks. She engages in cardio exercise for 30-45 minutes, 4-5 times per week. She is currently on Zepbound  for polyphagia.  The patient has been working with a chiropractor, who recently adjusted her medication regimen. She is now on Viibryd , which initially seemed effective, but then appeared to lose efficacy. After a dose increase, the patient reports feeling stabilized and believes the medication is working well. She also has a prescription for Xanax , which she rarely uses, but appreciates having available as a safety net.  The patient has been focusing on her diet, particularly her protein intake, to manage her polyphagia. She reports occasional cravings for carbohydrates, but has been able to resist these urges. She has also been mindful of her caloric intake, ensuring she consumes at least 1000 calories daily.  The patient has not reported any  gastrointestinal issues with Zepbound . She expresses a desire for more consistency in her exercise routine, aiming for at least five days a week of activity. She is planning a vacation, during which she intends to maintain her dietary and exercise routines.          PHYSICAL EXAM:  Blood pressure 110/74, pulse (!) 107, temperature 98 F (36.7 C), height 5' 6 (1.676 m), weight 211 lb (95.7 kg), SpO2 98%. Body mass index is 34.06 kg/m.  DIAGNOSTIC DATA REVIEWED:  BMET    Component Value Date/Time   NA 140 12/30/2022 0928   K 4.9 12/30/2022 0928   CL 103 12/30/2022 0928   CO2 20 12/30/2022 0928   GLUCOSE 82 12/30/2022 0928   GLUCOSE 95 11/03/2021 0917   BUN 10 12/30/2022 0928   CREATININE 0.95 12/30/2022 0928   CREATININE 0.88 11/03/2021 0917   CALCIUM  9.6 12/30/2022 0928   GFRNONAA >60 02/08/2021 1244   GFRNONAA 85 07/18/2020 0937   GFRAA 98 07/18/2020 0937   Lab Results  Component Value Date   HGBA1C 5.5 12/30/2022   HGBA1C 5.9 (H) 01/07/2011   Lab Results  Component Value Date   INSULIN  13.5 12/30/2022   INSULIN  28.0 (H) 08/27/2020   Lab Results  Component Value Date   TSH 0.794 12/30/2022   CBC    Component Value Date/Time   WBC 8.1 01/18/2023 0920   RBC 5.06 01/18/2023 0920   HGB 15.5 01/18/2023 0920   HGB 15.6 05/04/2021 0742   HCT 47.4 (H) 01/18/2023 0920   HCT 47.5 (H) 05/04/2021  0742   PLT 364 01/18/2023 0920   PLT 325 05/04/2021 0742   MCV 93.7 01/18/2023 0920   MCV 93 05/04/2021 0742   MCH 30.6 01/18/2023 0920   MCHC 32.7 01/18/2023 0920   RDW 13.1 01/18/2023 0920   RDW 13.9 05/04/2021 0742   Iron Studies No results found for: IRON, TIBC, FERRITIN, IRONPCTSAT Lipid Panel     Component Value Date/Time   CHOL 167 12/30/2022 0928   TRIG 117 12/30/2022 0928   HDL 50 12/30/2022 0928   CHOLHDL 2.5 11/03/2021 0917   VLDL 29 03/29/2016 1224   LDLCALC 96 12/30/2022 0928   LDLCALC 77 11/03/2021 0917   Hepatic Function Panel      Component Value Date/Time   PROT 7.0 12/30/2022 0928   ALBUMIN 4.5 12/30/2022 0928   AST 16 12/30/2022 0928   ALT 16 12/30/2022 0928   ALKPHOS 71 12/30/2022 0928   BILITOT 0.4 12/30/2022 0928   BILIDIR 0.1 09/27/2016 0941   IBILI 0.2 09/27/2016 0941      Component Value Date/Time   TSH 0.794 12/30/2022 0928   Nutritional Lab Results  Component Value Date   VD25OH 64.9 12/30/2022   VD25OH 65.5 05/04/2021   VD25OH 45.7 08/27/2020     Assessment and Plan    Obesity Actively working on diet and exercise. Lost three pounds in the last three weeks. Following a category two eating plan 75% of the time and engaging in cardio exercise 30-45 minutes, 4-5 times per week. Behavioral health is also addressing obesity. Discussed maintaining at least 1000 calories per day and ensuring adequate protein intake to avoid excessive hunger signals. - Continue current diet and exercise regimen - Ensure intake of at least 1000 calories per day and 80+ gm protein daily - Monitor protein intake to avoid excessive hunger signals  Polyphagia Managed with Zepbound . No gastrointestinal issues reported. Occasionally feels medication is not working, possibly due to insufficient protein intake. Discussed maintaining adequate protein intake to prevent excessive hunger signals. - Refill Zepbound   Anxiety with emotional eating behaviors Managed with Viibryd . Reports stabilization on current dose. Xanax  available as needed but not used recently. Discussed combining medication with therapy for optimal outcomes. Prefers having Xanax  available as a safety net. - Continue Viibryd  at current dose - Keep Xanax  available as needed  General Health Maintenance Maintaining a balanced diet and regular exercise. No issues with alcohol consumption. Plans to use sunscreen and avoid excessive sun exposure during upcoming vacation. - Ensure intake of at least 80 grams of protein daily - Maintain regular exercise routine -  Use sunscreen and avoid excessive sun exposure during vacation  Follow-up - Schedule March appointment.       She was informed of the importance of frequent follow up visits to maximize her success with intensive lifestyle modifications for her multiple health conditions.    Louann Penton, MD

## 2023-01-20 ENCOUNTER — Ambulatory Visit (INDEPENDENT_AMBULATORY_CARE_PROVIDER_SITE_OTHER): Payer: 59 | Admitting: Internal Medicine

## 2023-01-20 ENCOUNTER — Other Ambulatory Visit (HOSPITAL_BASED_OUTPATIENT_CLINIC_OR_DEPARTMENT_OTHER): Payer: Self-pay

## 2023-01-20 ENCOUNTER — Encounter: Payer: Self-pay | Admitting: Internal Medicine

## 2023-01-20 VITALS — BP 110/70 | HR 90 | Ht 66.0 in | Wt 216.0 lb

## 2023-01-20 DIAGNOSIS — I10 Essential (primary) hypertension: Secondary | ICD-10-CM | POA: Diagnosis not present

## 2023-01-20 DIAGNOSIS — F411 Generalized anxiety disorder: Secondary | ICD-10-CM | POA: Diagnosis not present

## 2023-01-20 DIAGNOSIS — Z8669 Personal history of other diseases of the nervous system and sense organs: Secondary | ICD-10-CM

## 2023-01-20 DIAGNOSIS — Z6834 Body mass index (BMI) 34.0-34.9, adult: Secondary | ICD-10-CM | POA: Diagnosis not present

## 2023-01-20 DIAGNOSIS — Z0000 Encounter for general adult medical examination without abnormal findings: Secondary | ICD-10-CM

## 2023-01-20 DIAGNOSIS — R7303 Prediabetes: Secondary | ICD-10-CM | POA: Diagnosis not present

## 2023-01-24 ENCOUNTER — Encounter: Payer: Self-pay | Admitting: Neurology

## 2023-01-25 ENCOUNTER — Other Ambulatory Visit: Payer: Self-pay | Admitting: Neurology

## 2023-01-28 ENCOUNTER — Other Ambulatory Visit (HOSPITAL_BASED_OUTPATIENT_CLINIC_OR_DEPARTMENT_OTHER): Payer: Self-pay

## 2023-01-29 ENCOUNTER — Other Ambulatory Visit (HOSPITAL_BASED_OUTPATIENT_CLINIC_OR_DEPARTMENT_OTHER): Payer: Self-pay

## 2023-01-30 ENCOUNTER — Other Ambulatory Visit: Payer: Self-pay | Admitting: Internal Medicine

## 2023-01-30 DIAGNOSIS — F3289 Other specified depressive episodes: Secondary | ICD-10-CM

## 2023-01-31 ENCOUNTER — Ambulatory Visit (INDEPENDENT_AMBULATORY_CARE_PROVIDER_SITE_OTHER): Payer: 59 | Admitting: Psychiatry

## 2023-01-31 ENCOUNTER — Other Ambulatory Visit (HOSPITAL_COMMUNITY): Payer: Self-pay

## 2023-01-31 ENCOUNTER — Other Ambulatory Visit: Payer: Self-pay

## 2023-01-31 DIAGNOSIS — F419 Anxiety disorder, unspecified: Secondary | ICD-10-CM | POA: Diagnosis not present

## 2023-01-31 DIAGNOSIS — E669 Obesity, unspecified: Secondary | ICD-10-CM

## 2023-01-31 MED ORDER — TOPIRAMATE 100 MG PO TABS
100.0000 mg | ORAL_TABLET | Freq: Every day | ORAL | 2 refills | Status: DC
  Filled 2023-01-31: qty 90, 90d supply, fill #0
  Filled 2023-04-27: qty 90, 90d supply, fill #1
  Filled 2023-07-24: qty 90, 90d supply, fill #2

## 2023-01-31 NOTE — Progress Notes (Unsigned)
[facility] Counselor/Therapist Progress Note  Patient ID: Dana Washington, MRN: [id number],    Date: 01/31/2023  Time Spent: 55 minutes start time 4:57 PM and time 5:52 PM Virtual Visit via Video Note Connected with patient by a telemedicine/telehealth application, with their informed consent, and verified patient privacy and that I am speaking with the correct person using two identifiers. I discussed the limitations, risks, security and privacy concerns of performing psychotherapy and the availability of in person appointments. I also discussed with the patient that there may be a patient responsible charge related to this service. The patient expressed understanding and agreed to proceed. I discussed the treatment planning with the patient. The patient was provided an opportunity to ask questions and all were answered. The patient agreed with the plan and demonstrated an understanding of the instructions. The patient was advised to call  our office if  symptoms worsen or feel they are in a crisis state and need immediate contact.   Therapist Location: home Patient Location: home    Treatment Type: Individual Therapy  Reported Symptoms: anxiety, triggered responses, rumination  Mental Status Exam:  Appearance:   Casual     Behavior:  Appropriate  Motor:  Normal  Speech/Language:   Normal Rate  Affect:  Appropriate  Mood:  anxious  Thought process:  normal  Thought content:    WNL  Sensory/Perceptual disturbances:    WNL  Orientation:  oriented to person, place, time/date, and situation  Attention:  Good  Concentration:  Good  Memory:  WNL  Fund of knowledge:   Good  Insight:    Good  Judgment:   Good  Impulse Control:  Good   Risk Assessment: Danger to Self:  No Self-injurious Behavior: No Danger to Others: No Duty to Warn:no Physical Aggression / Violence:No  Access to Firearms a concern: No  Gang Involvement:No   Subjective: Met with patient via virtual  session. She shared her trip was great and she was able to relax on the trip but work is stressful. She went on to share that the person that she doesn't work for anymore went on to send her lots of messages and it was triggering for her. She did talk to her boss about how she looks at her schedule everyday. She stated that it makes her feel like she doesn't trust her.  Patient went on to explain that her boss encouraged her to realize it does not matter and that she just needs to let it go.  Patient explained that there have been multiple complaints about this person and her practice and that multiple people have left because of her micromanaging yet things do not seem to change.  Currently there is someone from human resources that is going to come in and talk with all of them to try and find a solution to the problem.  Patient stated that she and her person underneath her will be in the meeting and both of them feel that she is difficult to work with and undermining.  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  Patient was also encouraged just to stay to the facts and to give different examples of things that triggered people to seek other positions to not have contact with her.  Patient  was also encouraged to think of a visual character or something she can think in her head when she is communicating with her so she does not get triggered.  She was able to agreed that thinking about the [employer] teacher when she is talking so that she does not get to where she ruminates on the things that she says could be beneficial and she agreed to try that.  Interventions: Solution-Oriented/Positive Psychology  Diagnosis:   ICD-10-CM   1. Generalized obesity  E66.9       Plan:  Patient is to use coping skills and CBT skills.   Patient is to follow plans from session to indicate the concerns for the department with the person who is to work on the issue with Boneta Lucks at work.  Patient is also to practice the visualization from session to help give her a cognitive disconnect when she is communicating with her so she does not get as triggered.  Patient is to work on exercising regularly.  Patient is to look at [facility] is the first dose by Dr. Casilda Carls.  Patient is to take medication as directed.   Stevphen Meuse, [facility]

## 2023-02-01 ENCOUNTER — Other Ambulatory Visit (HOSPITAL_COMMUNITY): Payer: Self-pay

## 2023-02-01 ENCOUNTER — Ambulatory Visit
Admission: RE | Admit: 2023-02-01 | Discharge: 2023-02-01 | Disposition: A | Discharge status: Home / Self Care | Payer: 59 | Source: Ambulatory Visit | Attending: Internal Medicine | Admitting: Internal Medicine

## 2023-02-01 DIAGNOSIS — Z1231 Encounter for screening mammogram for malignant neoplasm of breast: Secondary | ICD-10-CM

## 2023-02-02 ENCOUNTER — Other Ambulatory Visit: Payer: Self-pay

## 2023-02-02 ENCOUNTER — Telehealth: Payer: Self-pay | Admitting: Neurology

## 2023-02-02 NOTE — Telephone Encounter (Signed)
Pt states the PA for her Fremanezumab-vfrm (AJOVY) 225 MG/1.5ML SOAJ  from '24 has expired and that a new PA is needed for year '25

## 2023-02-03 ENCOUNTER — Other Ambulatory Visit: Payer: Self-pay

## 2023-02-03 NOTE — Telephone Encounter (Signed)
Pt called today to check  on PA for Ajovy. Would like a call back with  an update on the PA.

## 2023-02-04 ENCOUNTER — Telehealth: Payer: Self-pay

## 2023-02-04 ENCOUNTER — Other Ambulatory Visit: Payer: Self-pay

## 2023-02-04 ENCOUNTER — Other Ambulatory Visit (HOSPITAL_COMMUNITY): Payer: Self-pay

## 2023-02-04 NOTE — Telephone Encounter (Signed)
PA request has been Submitted. New Encounter created for follow up. For additional info see Pharmacy Prior Auth telephone encounter from 02/04/2023.

## 2023-02-04 NOTE — Telephone Encounter (Signed)
Pharmacy Patient Advocate Encounter   Received notification from Physician's Office that prior authorization for AJOVY (fremanezumab-vfrm) injection 225MG /1.5ML auto-injectors is required/requested.   Insurance verification completed.   The patient is insured through [facility] .   Per test claim: PA required; PA submitted to above mentioned insurance via CoverMyMeds Key/confirmation #/EOC [id number] Status is pending

## 2023-02-05 NOTE — Patient Instructions (Addendum)
Your lab results are excellent.  Blood pressure is under excellent control on current regimen.  Lipids are normal on statin medication.  Recommend continuing follow-up with Dr. Dalbert Garnet at [facility] healthy weight management.  Continue counseling with behavioral health.  Anxiety appears to be stable.  Thyroid functions are stable on thyroid replacement medication.  Okay to continue with Prilosec for GE reflux.  Recommend annual eye exam.  GYN records indicate Pap was done in 2020.

## 2023-02-07 ENCOUNTER — Other Ambulatory Visit (HOSPITAL_BASED_OUTPATIENT_CLINIC_OR_DEPARTMENT_OTHER): Payer: Self-pay

## 2023-02-08 ENCOUNTER — Other Ambulatory Visit (HOSPITAL_COMMUNITY): Payer: Self-pay

## 2023-02-08 NOTE — Telephone Encounter (Signed)
Pharmacy Patient Advocate Encounter  Received notification from [facility] that Prior Authorization for Ajovy 225mg /1.66ml has been APPROVED from 02/04/23 to 08/04/23 filled 02/04/23   PA #/Case ID/Reference #: [id number]

## 2023-02-10 ENCOUNTER — Other Ambulatory Visit: Payer: Self-pay

## 2023-02-14 ENCOUNTER — Ambulatory Visit (INDEPENDENT_AMBULATORY_CARE_PROVIDER_SITE_OTHER): Payer: 59 | Admitting: Psychiatry

## 2023-02-14 DIAGNOSIS — F411 Generalized anxiety disorder: Secondary | ICD-10-CM

## 2023-02-14 NOTE — Progress Notes (Unsigned)
      [facility] Counselor/Therapist Progress Note  Patient ID: Dana Washington, MRN: [id number],    Date: 02/14/2023  Time Spent: *** start time 4:02 PM Virtual Visit via Video Note Connected with patient by a telemedicine/telehealth application, with their informed consent, and verified patient privacy and that I am speaking with the correct person using two identifiers. I discussed the limitations, risks, security and privacy concerns of performing psychotherapy and the availability of in person appointments. I also discussed with the patient that there may be a patient responsible charge related to this service. The patient expressed understanding and agreed to proceed. I discussed the treatment planning with the patient. The patient was provided an opportunity to ask questions and all were answered. The patient agreed with the plan and demonstrated an understanding of the instructions. The patient was advised to call  our office if  symptoms worsen or feel they are in a crisis state and need immediate contact.   Therapist Location: home Patient Location: home    Treatment Type: Individual Therapy  Reported Symptoms: anxiety, sadness, triggered responses  Mental Status Exam:  Appearance:   Well Groomed     Behavior:  Appropriate  Motor:  Normal  Speech/Language:   Normal Rate  Affect:  Appropriate  Mood:  anxious  Thought process:  normal  Thought content:    WNL  Sensory/Perceptual disturbances:    WNL  Orientation:  oriented to person, place, time/date, and situation  Attention:  Good  Concentration:  Good  Memory:  WNL  Fund of knowledge:   Good  Insight:    Good  Judgment:   Good  Impulse Control:  Good   Risk Assessment: Danger to Self:  No Self-injurious Behavior: No Danger to Others: No Duty to Warn:no Physical Aggression / Violence:No  Access to Firearms a concern: No  Gang Involvement:No   Subjective: Met with patient via virtual session. She shared  that it has been a hard Monday. She shared that the meeting was hard and she cried through it. She went on to share that things continue to be difficult.    Interventions: {[id number]}  Diagnosis:   ICD-10-CM   1. Generalized anxiety disorder  F41.1       Plan:  Patient is to use coping skills and CBT skills.  Patient is to follow plans from session to indicate the concerns for the department with the person who is to work on the issue with Boneta Lucks at work.  Patient is also to practice the visualization from session to help give her a cognitive disconnect when she is communicating with her so she does not get as triggered.  Patient is to work on exercising regularly.  Patient is to look at [facility] is the first dose by Dr. Casilda Carls.  Patient is to take medication as directed.   Stevphen Meuse, [facility]

## 2023-02-16 ENCOUNTER — Ambulatory Visit (INDEPENDENT_AMBULATORY_CARE_PROVIDER_SITE_OTHER): Payer: 59 | Admitting: Family Medicine

## 2023-02-16 ENCOUNTER — Encounter (INDEPENDENT_AMBULATORY_CARE_PROVIDER_SITE_OTHER): Payer: Self-pay | Admitting: Family Medicine

## 2023-02-16 ENCOUNTER — Other Ambulatory Visit (HOSPITAL_BASED_OUTPATIENT_CLINIC_OR_DEPARTMENT_OTHER): Payer: Self-pay

## 2023-02-16 VITALS — BP 104/71 | HR 77 | Temp 97.9°F | Ht 66.0 in | Wt 211.0 lb

## 2023-02-16 DIAGNOSIS — R632 Polyphagia: Secondary | ICD-10-CM | POA: Diagnosis not present

## 2023-02-16 DIAGNOSIS — Z6834 Body mass index (BMI) 34.0-34.9, adult: Secondary | ICD-10-CM

## 2023-02-16 DIAGNOSIS — E669 Obesity, unspecified: Secondary | ICD-10-CM | POA: Diagnosis not present

## 2023-02-16 MED ORDER — ZEPBOUND 10 MG/0.5ML ~~LOC~~ SOAJ
10.0000 mg | SUBCUTANEOUS | 0 refills | Status: DC
  Filled 2023-02-16 – 2023-02-23 (×2): qty 2, 28d supply, fill #0

## 2023-02-16 NOTE — Progress Notes (Signed)
 .smr  Office: [phone]  /  Fax: [phone]  WEIGHT SUMMARY AND BIOMETRICS  Anthropometric Measurements Height: 5' 6 (1.676 m) Weight: 211 lb (95.7 kg) BMI (Calculated): 34.07 Weight at Last Visit: 211 lb Weight Lost Since Last Visit: 0 Weight Gained Since Last Visit: 0 Starting Weight: 273 lb Total Weight Loss (lbs): 62 lb (28.1 kg)   Body Composition  Body Fat %: 41.6 % Fat Mass (lbs): 88 lbs Muscle Mass (lbs): 117.4 lbs Total Body Water (lbs): 88.4 lbs Visceral Fat Rating : 10   Other Clinical Data Fasting: No Labs: No Today's Visit #: 62 Starting Date: 08/27/20    Chief Complaint: OBESITY   Discussed the use of AI scribe software for clinical note transcription with the patient, who gave verbal consent to proceed.  History of Present Illness   Dana Washington is a 48 year old female who presents for a follow-up visit to discuss her weight management.  She has maintained her weight over the past month and adheres to her category two plan approximately 90% of the time. She exercises for 20 to 45 minutes, three to five times per week.  She is currently taking Zepbound  for polyphasia and requests a refill. She has mixed feelings about the medication's effectiveness, noting that sometimes it helps with hunger, but at other times she feels hungry or eats out of boredom or stress. She frequently thinks about food and questions whether her hunger is genuine or due to other factors.  Her sleep is fine, and she does not consume alcohol regularly, only having a mimosa daily during a recent cruise. She follows a high-protein diet, consuming two hard-boiled eggs, high-protein yogurt, and deli meat for lunch, and a protein with vegetables for dinner. She avoids carbohydrates and is concerned about being too heavy on protein.  She is frustrated with her weight loss progress, having lost 30 pounds over the past year, and compares her progress to others who have lost more  weight in a shorter time. She feels she is doing everything right but is not seeing the results she desires. Her busy work schedule sometimes interferes with her ability to exercise as planned.          PHYSICAL EXAM:  Blood pressure 104/71, pulse 77, temperature 97.9 F (36.6 C), height 5' 6 (1.676 m), weight 211 lb (95.7 kg), last menstrual period 01/18/2023, SpO2 99%. Body mass index is 34.06 kg/m.  DIAGNOSTIC DATA REVIEWED:  BMET    Component Value Date/Time   NA 140 12/30/2022 0928   K 4.9 12/30/2022 0928   CL 103 12/30/2022 0928   CO2 20 12/30/2022 0928   GLUCOSE 82 12/30/2022 0928   GLUCOSE 95 11/03/2021 0917   BUN 10 12/30/2022 0928   CREATININE 0.95 12/30/2022 0928   CREATININE 0.88 11/03/2021 0917   CALCIUM  9.6 12/30/2022 0928   GFRNONAA >60 02/08/2021 1244   GFRNONAA 85 07/18/2020 0937   GFRAA 98 07/18/2020 0937   Lab Results  Component Value Date   HGBA1C 5.5 12/30/2022   HGBA1C 5.9 (H) 01/07/2011   Lab Results  Component Value Date   INSULIN  13.5 12/30/2022   INSULIN  28.0 (H) 08/27/2020   Lab Results  Component Value Date   TSH 0.794 12/30/2022   CBC    Component Value Date/Time   WBC 8.1 01/18/2023 0920   RBC 5.06 01/18/2023 0920   HGB 15.5 01/18/2023 0920   HGB 15.6 05/04/2021 0742   HCT 47.4 (H) 01/18/2023 0920   HCT  47.5 (H) 05/04/2021 0742   PLT 364 01/18/2023 0920   PLT 325 05/04/2021 0742   MCV 93.7 01/18/2023 0920   MCV 93 05/04/2021 0742   MCH 30.6 01/18/2023 0920   MCHC 32.7 01/18/2023 0920   RDW 13.1 01/18/2023 0920   RDW 13.9 05/04/2021 0742   Iron Studies No results found for: IRON, TIBC, FERRITIN, IRONPCTSAT Lipid Panel     Component Value Date/Time   CHOL 167 12/30/2022 0928   TRIG 117 12/30/2022 0928   HDL 50 12/30/2022 0928   CHOLHDL 2.5 11/03/2021 0917   VLDL 29 03/29/2016 1224   LDLCALC 96 12/30/2022 0928   LDLCALC 77 11/03/2021 0917   Hepatic Function Panel     Component Value Date/Time   PROT 7.0  12/30/2022 0928   ALBUMIN 4.5 12/30/2022 0928   AST 16 12/30/2022 0928   ALT 16 12/30/2022 0928   ALKPHOS 71 12/30/2022 0928   BILITOT 0.4 12/30/2022 0928   BILIDIR 0.1 09/27/2016 0941   IBILI 0.2 09/27/2016 0941      Component Value Date/Time   TSH 0.794 12/30/2022 0928   Nutritional Lab Results  Component Value Date   VD25OH 64.9 12/30/2022   VD25OH 65.5 05/04/2021   VD25OH 45.7 08/27/2020     Assessment and Plan    Obesity She has maintained her weight over the past month, adhering to her category two plan about ninety percent of the time. She exercises twenty to forty-five minutes three to five times per week. Despite her efforts, she feels frustrated with her progress, comparing herself to others who have lost more weight in a shorter period. She is on Zepbound  for polyphagia and requests a refill. She reports mixed feelings about the effectiveness of the current dose, sometimes feeling hungry and attributing it to non-hunger eating due to boredom or stress. Zepbound  includes GIP and GLP-1, which can cross the blood-brain barrier to improve neurologic hunger, potentially more effective at higher doses. Alcohol and carbohydrate intake can negate the effectiveness of GLP-1. She does not consume alcohol regularly and follows a high-protein diet. Emphasized the importance of not comparing her progress to others and the significance of her thirty-pound weight loss over the past year. - Refill Zepbound  - Continue current exercise regimen - Monitor carbohydrate intake - Encourage continued adherence to high-protein diet - Schedule follow-up appointment next month  Polyphagia She is currently on Zepbound  for polyphagia. Reports mixed effectiveness of the medication, sometimes feeling hungry and attributing it to non-hunger eating due to boredom or stress. Zepbound 's effectiveness might improve at higher doses. Alcohol and carbohydrate intake can impact hunger and the effectiveness of  GLP-1. - Refill Zepbound  - Monitor and adjust Zepbound  dosage as needed - Encourage monitoring of carbohydrate intake  General Health Maintenance She has lost thirty pounds over the past year and is healthier based on her blood work. Follows a high-protein diet and avoids fast food and sugary drinks. Provided encouragement and emphasized the importance of not comparing her progress to others. - Continue high-protein diet - Avoid fast food and sugary drinks - Encourage regular physical activity - Provide emotional support and encouragement  Follow-up - Schedule follow-up appointment next month.         I have personally spent 30 minutes total time today in preparation, patient care, and documentation for this visit, including the following: review of clinical lab tests; review of medical tests/procedures/services.    She was informed of the importance of frequent follow up visits to maximize her  success with intensive lifestyle modifications for her multiple health conditions.    Louann Penton, MD

## 2023-02-23 ENCOUNTER — Other Ambulatory Visit (HOSPITAL_BASED_OUTPATIENT_CLINIC_OR_DEPARTMENT_OTHER): Payer: Self-pay

## 2023-02-24 ENCOUNTER — Other Ambulatory Visit (HOSPITAL_BASED_OUTPATIENT_CLINIC_OR_DEPARTMENT_OTHER): Payer: Self-pay

## 2023-03-02 ENCOUNTER — Other Ambulatory Visit (HOSPITAL_COMMUNITY): Payer: Self-pay

## 2023-03-07 ENCOUNTER — Ambulatory Visit: Payer: 59 | Admitting: Psychiatry

## 2023-03-07 DIAGNOSIS — F411 Generalized anxiety disorder: Secondary | ICD-10-CM | POA: Diagnosis not present

## 2023-03-07 NOTE — Progress Notes (Unsigned)
 [facility] Counselor/Therapist Progress Note  Patient ID: Dana Washington, MRN: [id number],    Date: 03/07/2023  Time Spent: 51 minutes start time 4:01 PM end time 4:52 PM Virtual Visit via Video Note Connected with patient by a telemedicine/telehealth application, with their informed consent, and verified patient privacy and that I am speaking with the correct person using two identifiers. I discussed the limitations, risks, security and privacy concerns of performing psychotherapy and the availability of in person appointments. I also discussed with the patient that there may be a patient responsible charge related to this service. The patient expressed understanding and agreed to proceed. I discussed the treatment planning with the patient. The patient was provided an opportunity to ask questions and all were answered. The patient agreed with the plan and demonstrated an understanding of the instructions. The patient was advised to call  our office if  symptoms worsen or feel they are in a crisis state and need immediate contact.   Therapist Location: home Patient Location: home    Treatment Type: Individual Therapy  Reported Symptoms: anxiety, sadness, triggered responses, crying spells, sleep issues, ruminations  Mental Status Exam:  Appearance:   Well Groomed     Behavior:  Appropriate  Motor:  Normal  Speech/Language:   Normal Rate  Affect:  Appropriate  Mood:  anxious  Thought process:  normal  Thought content:    WNL  Sensory/Perceptual disturbances:    WNL  Orientation:  oriented to person, place, time/date, and situation  Attention:  Good  Concentration:  Good  Memory:  WNL  Fund of knowledge:   Good  Insight:    Good  Judgment:   Good  Impulse Control:  Good   Risk Assessment: Danger to Self:  No Self-injurious Behavior: No Danger to Others: No Duty to Warn:no Physical Aggression / Violence:No  Access to Firearms a concern: No  Gang Involvement:No    Subjective: Met with patient via virtual session. She shared she had a meeting with her boss and she was told that she was going to be written up for her behavior.  She is not sure what she will be written up for at this time. She explained it has been upsetting for her. She went on to share that she has been working to try and change her perspective and keep her mood better.  She was able to see that a she enjoys her job and wants to stay in her position.  Discussed the fact that she will need to change her perspective and put some CBT filters in place so that she will be able to maintain and keep herself at a good place with the difficult dynamics within her work situation.  Patient was able to think through what is most important to her and decided that she could focus on those things.  Also discussed the importance of releasing the negative emotions on a regular basis so that she does not increase her anxiety.  Interventions: Cognitive Behavioral Therapy and Solution-Oriented/Positive Psychology  Diagnosis:   ICD-10-CM   1. Generalized anxiety disorder  F41.1       Plan:  Patient is to use coping skills and CBT skills.  Patient is to focus on what she can control fix and change.  Patient is also to practice the visualization from session to help give her a cognitive disconnect when she is communicating with her so she does not get as triggered.  Patient is to work on exercising  regularly.  Patient is to look at [facility] is the first dose by Dr. Casilda Carls.  Patient is to take medication as directed.   Stevphen Meuse, [facility]

## 2023-03-14 ENCOUNTER — Other Ambulatory Visit (HOSPITAL_COMMUNITY): Payer: Self-pay

## 2023-03-14 ENCOUNTER — Other Ambulatory Visit: Payer: Self-pay | Admitting: Neurology

## 2023-03-14 ENCOUNTER — Other Ambulatory Visit (HOSPITAL_BASED_OUTPATIENT_CLINIC_OR_DEPARTMENT_OTHER): Payer: Self-pay

## 2023-03-14 ENCOUNTER — Encounter (INDEPENDENT_AMBULATORY_CARE_PROVIDER_SITE_OTHER): Payer: Self-pay | Admitting: Family Medicine

## 2023-03-14 ENCOUNTER — Ambulatory Visit (INDEPENDENT_AMBULATORY_CARE_PROVIDER_SITE_OTHER): Payer: 59 | Admitting: Family Medicine

## 2023-03-14 VITALS — BP 105/73 | HR 91 | Temp 98.0°F | Ht 66.0 in | Wt 205.0 lb

## 2023-03-14 DIAGNOSIS — I10 Essential (primary) hypertension: Secondary | ICD-10-CM

## 2023-03-14 DIAGNOSIS — R632 Polyphagia: Secondary | ICD-10-CM

## 2023-03-14 DIAGNOSIS — Z6833 Body mass index (BMI) 33.0-33.9, adult: Secondary | ICD-10-CM | POA: Diagnosis not present

## 2023-03-14 DIAGNOSIS — E66813 Obesity, class 3: Secondary | ICD-10-CM

## 2023-03-14 DIAGNOSIS — E669 Obesity, unspecified: Secondary | ICD-10-CM | POA: Diagnosis not present

## 2023-03-14 MED ORDER — UBRELVY 100 MG PO TABS
100.0000 mg | ORAL_TABLET | ORAL | 0 refills | Status: DC | PRN
  Filled 2023-03-14: qty 6, 3d supply, fill #0

## 2023-03-14 MED ORDER — ZEPBOUND 10 MG/0.5ML ~~LOC~~ SOAJ
10.0000 mg | SUBCUTANEOUS | 0 refills | Status: DC
Start: 2023-03-14 — End: 2023-04-11
  Filled 2023-03-14 – 2023-03-18 (×2): qty 2, 28d supply, fill #0

## 2023-03-14 NOTE — Progress Notes (Signed)
 Office: [phone]  /  Fax: [phone]  WEIGHT SUMMARY AND BIOMETRICS  Anthropometric Measurements Height: 5\' 6"  (1.676 m) Weight: 205 lb (93 kg) BMI (Calculated): 33.1 Weight at Last Visit: 211 lb Weight Lost Since Last Visit: 6 lb Weight Gained Since Last Visit: 0 Starting Weight: 273 lb Total Weight Loss (lbs): 68 lb (30.8 kg) Peak Weight: 273 lb   Body Composition  Body Fat %: 39.8 % Fat Mass (lbs): 82 lbs Muscle Mass (lbs): 117.6 lbs Total Body Water (lbs): 84.6 lbs Visceral Fat Rating : 10   Other Clinical Data Fasting: no Labs: no Today's Visit #: 34 Starting Date: 08/27/20    Chief Complaint: OBESITY   Discussed the use of AI scribe software for clinical note transcription with the patient, who gave verbal consent to proceed.  History of Present Illness   Dana Washington is a 48 year old female who presents for obesity treatment plan and progress monitoring.  She has lost six pounds in the last month since her last visit. She follows the category two eating plan 96% of the time and exercises using the elliptical for 20 minutes, five times per week. She is concerned about not eating enough calories. Protein intake is adequate, with a goal of 80 grams per day. Her diet primarily consists of protein and vegetables, with meals including two hard-boiled eggs for breakfast, yogurt and deli meat for lunch, and lean protein with vegetables for dinner. Occasionally, she eats a high-protein tortilla with ground [place] or beef. She also allows herself small treats like mini packs of gummy bears.  She experiences work-related stress and is working on her mental health by adopting a new outlook on life, reading 'Don't Sweat the Small Stuff and It's All Small Stuff' to help manage stress.  She is currently taking Zepbound for her polyplasia and requests a refill. No nausea or other side effects from the medication, although she experienced nausea this morning, which  she attributes to a migraine. No nausea related to Zepbound use.  She has a history of hypertension and is on benazepril and hydrochlorothiazide. Her blood pressure is well controlled at 105/73.          PHYSICAL EXAM:  Blood pressure 105/73, pulse 91, temperature 98 F (36.7 C), height 5\' 6"  (1.676 m), weight 205 lb (93 kg), SpO2 98%. Body mass index is 33.09 kg/m.  DIAGNOSTIC DATA REVIEWED:  BMET    Component Value Date/Time   NA 140 12/30/2022 0928   K 4.9 12/30/2022 0928   CL 103 12/30/2022 0928   CO2 20 12/30/2022 0928   GLUCOSE 82 12/30/2022 0928   GLUCOSE 95 11/03/2021 0917   BUN 10 12/30/2022 0928   CREATININE 0.95 12/30/2022 0928   CREATININE 0.88 11/03/2021 0917   CALCIUM 9.6 12/30/2022 0928   GFRNONAA >60 02/08/2021 1244   GFRNONAA 85 07/18/2020 0937   GFRAA 98 07/18/2020 0937   Lab Results  Component Value Date   HGBA1C 5.5 12/30/2022   HGBA1C 5.9 (H) 01/07/2011   Lab Results  Component Value Date   INSULIN 13.5 12/30/2022   INSULIN 28.0 (H) 08/27/2020   Lab Results  Component Value Date   TSH 0.794 12/30/2022   CBC    Component Value Date/Time   WBC 8.1 01/18/2023 0920   RBC 5.06 01/18/2023 0920   HGB 15.5 01/18/2023 0920   HGB 15.6 05/04/2021 0742   HCT 47.4 (H) 01/18/2023 0920   HCT 47.5 (H) 05/04/2021 0742   PLT  364 01/18/2023 0920   PLT 325 05/04/2021 0742   MCV 93.7 01/18/2023 0920   MCV 93 05/04/2021 0742   MCH 30.6 01/18/2023 0920   MCHC 32.7 01/18/2023 0920   RDW 13.1 01/18/2023 0920   RDW 13.9 05/04/2021 0742   Iron Studies No results found for: "IRON", "TIBC", "FERRITIN", "IRONPCTSAT" Lipid Panel     Component Value Date/Time   CHOL 167 12/30/2022 0928   TRIG 117 12/30/2022 0928   HDL 50 12/30/2022 0928   CHOLHDL 2.5 11/03/2021 0917   VLDL 29 03/29/2016 1224   LDLCALC 96 12/30/2022 0928   LDLCALC 77 11/03/2021 0917   Hepatic Function Panel     Component Value Date/Time   PROT 7.0 12/30/2022 0928   ALBUMIN 4.5  12/30/2022 0928   AST 16 12/30/2022 0928   ALT 16 12/30/2022 0928   ALKPHOS 71 12/30/2022 0928   BILITOT 0.4 12/30/2022 0928   BILIDIR 0.1 09/27/2016 0941   IBILI 0.2 09/27/2016 0941      Component Value Date/Time   TSH 0.794 12/30/2022 0928   Nutritional Lab Results  Component Value Date   VD25OH 64.9 12/30/2022   VD25OH 65.5 05/04/2021   VD25OH 45.7 08/27/2020     Assessment and Plan    Obesity with POlyphagia Actively working on weight loss with a six-pound reduction in the last month. Adhering to a category two eating plan 96% of the time and exercises on the elliptical for 20 minutes, five times per week. Currently on Zepbound for weight management with no reported side effects. Positive reinforcement from peers is beneficial for her mental health. Discussed the importance of maintaining a minimum intake of 1200 calories per day and ensuring adequate protein intake. Potential for stomach upset if food volume is excessive, carbohydrate intake is too high, or if she goes too long without eating. - Refill Zepbound prescription - Continue category two eating plan - Continue current exercise regimen - Ensure a minimum intake of 1200 calories per day - Monitor protein intake to ensure at least 75 grams per day, aiming for 80 grams - Recheck labs by May  Hypertension Well controlled with benazepril and hydrochlorothiazide. Current blood pressure is 105/73. Improvement in blood pressure may necessitate a future adjustment in medication dosage. - Continue benazepril and hydrochlorothiazide - Monitor blood pressure regularly - Consider medication dosage adjustment if blood pressure continues to improve  General Health Maintenance Maintaining a healthy diet and exercise routine. Focusing on mental health and stress management, contributing positively to her overall well-being. - Continue current mental health practices and stress management techniques -Make sure she does not fall  back into unhealthy restrictive eating habits - Ensure regular follow-up appointments  Follow-up 4 weeks.       She was informed of the importance of frequent follow up visits to maximize her success with intensive lifestyle modifications for her multiple health conditions.    Quillian Quince, MD

## 2023-03-18 ENCOUNTER — Other Ambulatory Visit (HOSPITAL_BASED_OUTPATIENT_CLINIC_OR_DEPARTMENT_OTHER): Payer: Self-pay

## 2023-03-18 ENCOUNTER — Other Ambulatory Visit: Payer: Self-pay | Admitting: Internal Medicine

## 2023-03-18 ENCOUNTER — Other Ambulatory Visit: Payer: Self-pay

## 2023-03-18 MED ORDER — OMEPRAZOLE 20 MG PO CPDR
20.0000 mg | DELAYED_RELEASE_CAPSULE | Freq: Every day | ORAL | 3 refills | Status: AC
  Filled 2023-03-18: qty 90, 90d supply, fill #0
  Filled 2023-06-15: qty 90, 90d supply, fill #1
  Filled 2023-09-13: qty 90, 90d supply, fill #2
  Filled 2023-12-09: qty 90, 90d supply, fill #3

## 2023-03-21 ENCOUNTER — Ambulatory Visit: Payer: 59 | Admitting: Psychiatry

## 2023-03-21 DIAGNOSIS — F411 Generalized anxiety disorder: Secondary | ICD-10-CM | POA: Diagnosis not present

## 2023-03-21 NOTE — Progress Notes (Unsigned)
 [facility] Counselor/Therapist Progress Note  Patient ID: Dana Washington, MRN: [id number],    Date: 03/21/2023  Time Spent: 55 minutes start time 3:58 PM end time 4:53 PM Virtual Visit via Video Note Connected with patient by a telemedicine/telehealth application, with their informed consent, and verified patient privacy and that I am speaking with the correct person using two identifiers. I discussed the limitations, risks, security and privacy concerns of performing psychotherapy and the availability of in person appointments. I also discussed with the patient that there may be a patient responsible charge related to this service. The patient expressed understanding and agreed to proceed. I discussed the treatment planning with the patient. The patient was provided an opportunity to ask questions and all were answered. The patient agreed with the plan and demonstrated an understanding of the instructions. The patient was advised to call  our office if  symptoms worsen or feel they are in a crisis state and need immediate contact.   Therapist Location: home Patient Location: home    Treatment Type: Individual Therapy  Reported Symptoms: anxiety, triggered responses, rumination, sadness  Mental Status Exam:  Appearance:   Well Groomed     Behavior:  Appropriate  Motor:  Normal  Speech/Language:   Normal Rate  Affect:  Appropriate  Mood:  anxious  Thought process:  normal  Thought content:    WNL  Sensory/Perceptual disturbances:    WNL  Orientation:  oriented to person, place, time/date, and situation  Attention:  Good  Concentration:  Good  Memory:  WNL  Fund of knowledge:   Good  Insight:    Good  Judgment:   Good  Impulse Control:  Good   Risk Assessment: Danger to Self:  No Self-injurious Behavior: No Danger to Others: No Duty to Warn:no Physical Aggression / Violence:No  Access to Firearms a concern:  no Gang Involvement:No   Subjective: Met with  patient via virtual session. She shared she has been working on her perspective and that has helped.  She went on to explain visualization from last session had made a huge difference in being able to maintain her anxiety and emotions when she gets upset.  She shared she had to have a hard conversation with someone and she was dreading it but it turned out good.  Patient was also able to share she has had a few people tell her how much they appreciate what she does is the supervisor at the same time they were leaving due to the person that was creating issues for her.  Patient explained that her supervisor has still not given her her job description as well as the job description for the person who was under her.  Patient explained that she was getting calls over the weekend because that person was not doing what she needed to do and answering the phone and helping.  Encouraged patient to start tracking all the times that she has to do this other person's job and to just have that information in case her supervisor is at a place where she can share with her.  Patient was also encouraged to continue to remind herself of while she is at her current position.  It does allow her the freedom to travel give her the resources that she needs and she does like the job that she does.  Patient was encouraged to write some of those things down so she can read them when she starts getting upset.  Patient was  also encouraged to make sure she is doing some journaling in the evening and releasing any of the negative thoughts and feelings so she can rest better at night.  Interventions: Cognitive Behavioral Therapy, Solution-Oriented/Positive Psychology, and Insight-Oriented  Diagnosis:   ICD-10-CM   1. Generalized anxiety disorder  F41.1       Plan:  Patient is to use coping skills and CBT skills.  Patient is to write down the things that she likes about her job so she can read it when she is starting to feel anxious or  upset.  Patient is to focus on what she can control fix and change.  Patient is also to practice the visualization from session to help give her a cognitive disconnect when she is communicating with her so she does not get as triggered.  Patient is to work on exercising regularly.  Patient is to look at [facility] is the first dose by Dr. Casilda Carls.  Patient is to take medication as directed.   Stevphen Meuse, [facility]

## 2023-03-22 ENCOUNTER — Other Ambulatory Visit (HOSPITAL_BASED_OUTPATIENT_CLINIC_OR_DEPARTMENT_OTHER): Payer: Self-pay

## 2023-03-22 ENCOUNTER — Telehealth: Payer: Self-pay

## 2023-03-22 ENCOUNTER — Telehealth: Payer: Self-pay | Admitting: Neurology

## 2023-03-22 ENCOUNTER — Other Ambulatory Visit (HOSPITAL_COMMUNITY): Payer: Self-pay

## 2023-03-22 NOTE — Telephone Encounter (Signed)
 Pt said her pharmacy informed her that they are still waiting on the Pre Auth for her  Ubrogepant (UBRELVY) 100 MG TABS

## 2023-03-22 NOTE — Telephone Encounter (Signed)
 Pharmacy Patient Advocate Encounter   Received notification from Pt Calls Messages that prior authorization for Ubrelvy 100MG  tablets is required/requested.   Insurance verification completed.   The patient is insured through [facility] .   Per test claim: PA required; PA submitted to above mentioned insurance via CoverMyMeds Key/confirmation #/EOC [id number] Status is pending

## 2023-03-23 NOTE — Telephone Encounter (Signed)
 PA request has been Submitted. New Encounter has been or will be created for follow up. For additional info see Pharmacy Prior Auth telephone encounter from 03-22-2023.

## 2023-03-24 ENCOUNTER — Other Ambulatory Visit: Payer: Self-pay

## 2023-03-24 ENCOUNTER — Other Ambulatory Visit (HOSPITAL_COMMUNITY): Payer: Self-pay

## 2023-03-24 MED ORDER — UBRELVY 100 MG PO TABS
100.0000 mg | ORAL_TABLET | ORAL | 11 refills | Status: AC | PRN
  Filled 2023-03-24: qty 16, 30d supply, fill #0
  Filled 2023-05-29: qty 16, 30d supply, fill #1
  Filled 2023-07-10: qty 16, 30d supply, fill #2
  Filled 2023-08-29: qty 16, 30d supply, fill #3
  Filled 2023-12-09: qty 16, 30d supply, fill #4
  Filled 2024-01-04: qty 16, 30d supply, fill #5

## 2023-03-24 NOTE — Telephone Encounter (Signed)
 Pharmacy Patient Advocate Encounter  Received notification from [facility] that Prior Authorization for Ubrelvy 100MG  tablets  has been APPROVED from 03/23/2023 to 09/19/2023   PA #/Case ID/Reference #: [id number]

## 2023-03-24 NOTE — Addendum Note (Signed)
 Addended by: Guy Begin on: 03/24/2023 09:36 AM   Modules accepted: Orders

## 2023-03-24 NOTE — Telephone Encounter (Signed)
 Renewed prescription for ubrelvy.

## 2023-03-25 ENCOUNTER — Other Ambulatory Visit (HOSPITAL_BASED_OUTPATIENT_CLINIC_OR_DEPARTMENT_OTHER): Payer: Self-pay

## 2023-03-25 ENCOUNTER — Other Ambulatory Visit (HOSPITAL_COMMUNITY): Payer: Self-pay

## 2023-03-31 ENCOUNTER — Other Ambulatory Visit: Payer: Self-pay

## 2023-03-31 ENCOUNTER — Other Ambulatory Visit (HOSPITAL_COMMUNITY): Payer: Self-pay

## 2023-04-04 ENCOUNTER — Other Ambulatory Visit (HOSPITAL_COMMUNITY): Payer: Self-pay

## 2023-04-05 ENCOUNTER — Ambulatory Visit (INDEPENDENT_AMBULATORY_CARE_PROVIDER_SITE_OTHER): Payer: 59 | Admitting: Psychiatry

## 2023-04-05 DIAGNOSIS — F411 Generalized anxiety disorder: Secondary | ICD-10-CM

## 2023-04-05 NOTE — Progress Notes (Unsigned)
 [facility] Counselor/Therapist Progress Note  Patient ID: Dana Washington, MRN: [id number],    Date: 04/05/2023  Time Spent: 57 minutes start time 4:57 PM end time 5:54 PM Virtual Visit via Video Note Connected with patient by a telemedicine/telehealth application, with their informed consent, and verified patient privacy and that I am speaking with the correct person using two identifiers. I discussed the limitations, risks, security and privacy concerns of performing psychotherapy and the availability of in person appointments. I also discussed with the patient that there may be a patient responsible charge related to this service. The patient expressed understanding and agreed to proceed. I discussed the treatment planning with the patient. The patient was provided an opportunity to ask questions and all were answered. The patient agreed with the plan and demonstrated an understanding of the instructions. The patient was advised to call  our office if  symptoms worsen or feel they are in a crisis state and need immediate contact.   Therapist Location: home Patient Location: home    Treatment Type: Individual Therapy  Reported Symptoms: anxiety, sadness, rumination, triggered responses, rumination  Mental Status Exam:  Appearance:   Well Groomed     Behavior:  Appropriate  Motor:  Normal  Speech/Language:   Normal Rate  Affect:  Appropriate  Mood:  anxious  Thought process:  normal  Thought content:    WNL  Sensory/Perceptual disturbances:    WNL  Orientation:  oriented to person, place, time/date, and situation  Attention:  Good  Concentration:  Good  Memory:  WNL  Fund of knowledge:   Good  Insight:    Good  Judgment:   Good  Impulse Control:  Good   Risk Assessment: Danger to Self:  No Self-injurious Behavior: No Danger to Others: No Duty to Warn:no Physical Aggression / Violence:No  Access to Firearms a concern: No  Gang Involvement:No   Subjective:  Met with patient via virtual session. She shared there was an incident with her dogs that was hard.  She explained what happened and what she had witnessed happen with her dogs.  It was a very traumatic incident and left patient feeling very overwhelmed.  Gave her time to process the situation and discussed how she wanted to handle it at this time.  Patient was able to recognize at this time she feels overall her daughters are say what had transpired was more of a freak accident than typical behavior or something that would happen in the future.  Patient was encouraged to remind herself that she had done everything right and there was nothing she could have done to change the situation.  Patient agreed to remind herself of the truth and how she did not do what she could do in the situation and that she always takes every measure to keep her dogs and others safe.  Patient shared that her boss did give her the write out and finally wrote out her job description and they will be going over everything soon discussed perspective and ways to keep her CBT filters and check so that she is able to see this as a positive thing and how it helps her to have clear boundaries and expectations of the people that work for her.  Patient did share that tools from other sessions have been helping her and she has been utilizing them regularly to try and decrease anxiety.  Interventions: Cognitive Behavioral Therapy, Solution-Oriented/Positive Psychology, and Insight-Oriented  Diagnosis:   ICD-10-CM  1. Generalized anxiety disorder  F41.1       Plan: Patient is to use coping skills and CBT skills.  Patient is to remind herself of the truth/facts regarding work and the situation with her dogs that create anxiety for her.  Patient is to practice using CBT filters regularly at work..  Patient is to focus on what she can control fix and change.  Patient is also to practice the visualization from session to help give her a  cognitive disconnect when she is communicating with her so she does not get as triggered.  Patient is to work on exercising regularly.  Patient is to look at [facility] is the first dose by Dr. Casilda Carls.  Patient is to take medication as directed.     Stevphen Meuse, [facility]

## 2023-04-11 ENCOUNTER — Other Ambulatory Visit (HOSPITAL_BASED_OUTPATIENT_CLINIC_OR_DEPARTMENT_OTHER): Payer: Self-pay

## 2023-04-11 ENCOUNTER — Ambulatory Visit (INDEPENDENT_AMBULATORY_CARE_PROVIDER_SITE_OTHER): Payer: 59 | Admitting: Family Medicine

## 2023-04-11 ENCOUNTER — Encounter (INDEPENDENT_AMBULATORY_CARE_PROVIDER_SITE_OTHER): Payer: Self-pay | Admitting: Family Medicine

## 2023-04-11 VITALS — BP 101/66 | HR 90 | Temp 98.6°F | Ht 66.0 in | Wt 204.0 lb

## 2023-04-11 DIAGNOSIS — Z6832 Body mass index (BMI) 32.0-32.9, adult: Secondary | ICD-10-CM

## 2023-04-11 DIAGNOSIS — F439 Reaction to severe stress, unspecified: Secondary | ICD-10-CM

## 2023-04-11 DIAGNOSIS — R632 Polyphagia: Secondary | ICD-10-CM | POA: Diagnosis not present

## 2023-04-11 DIAGNOSIS — E669 Obesity, unspecified: Secondary | ICD-10-CM | POA: Diagnosis not present

## 2023-04-11 MED ORDER — ZEPBOUND 12.5 MG/0.5ML ~~LOC~~ SOAJ
12.5000 mg | SUBCUTANEOUS | 0 refills | Status: DC
Start: 2023-04-11 — End: 2023-05-09
  Filled 2023-04-11: qty 2, 28d supply, fill #0

## 2023-04-11 NOTE — Progress Notes (Signed)
 Office: [phone]  /  Fax: [phone]  WEIGHT SUMMARY AND BIOMETRICS  Anthropometric Measurements Height: 5\' 6"  (1.676 m) Weight: 204 lb (92.5 kg) BMI (Calculated): 32.94 Weight at Last Visit: 205 lb Weight Lost Since Last Visit: 1 lb Weight Gained Since Last Visit: 0 Starting Weight: 273 lb Total Weight Loss (lbs): 69 lb (31.3 kg) Peak Weight: 273 lb   Body Composition  Body Fat %: 40.5 % Fat Mass (lbs): 82.6 lbs Muscle Mass (lbs): 115.2 lbs Total Body Water (lbs): 87.6 lbs Visceral Fat Rating : 10   Other Clinical Data Fasting: No Labs: No Today's Visit #: 35 Starting Date: 08/27/20    Chief Complaint: OBESITY   Discussed the use of AI scribe software for clinical note transcription with the patient, who gave verbal consent to proceed.  History of Present Illness Dana Washington is a 48 year old female who presents for obesity treatment plan assessment and progress evaluation.  She is adhering to a category two eating plan with 90% compliance and has lost one pound in the last month. She exercises four to five times per week, engaging in 40-minute sessions on the elliptical and bike. She is currently taking Zetban 10 mg weekly and requests a refill.  She notes variability in the effectiveness of her medication, sometimes feeling it works better than at other times. She describes a recent experience where the medication seemed more effective, speculating that the effectiveness might be related to the injection technique, mentioning occasional blood at the injection site.  Her diet primarily consists of eggs, yogurt, deli meats, and vegetables, with occasional snacks like single-serve popcorn and [employer] chocolate chip cookies. She avoids simple carbohydrates and no longer consumes pretzels. She expresses concern about the availability of hard-boiled eggs due to bird flu, which affects her usual breakfast routine. She occasionally walks around the hospital at  lunchtime and uses a stationary bike at home while reading, although she does not consider these activities part of her formal exercise routine. She aims for 40 to 45 minutes of exercise per session, averaging 40 minutes. She shares a dessert recipe involving cottage cheese and sugar-free pudding, indicating her interest in maintaining a low-calorie, high-protein diet. She discusses her preference for texture in foods and her dislike for chocolate, considering alternatives like vanilla or cheesecake-flavored pudding.  No issues with allergies despite the spring season.      PHYSICAL EXAM:  Blood pressure 101/66, pulse 90, temperature 98.6 F (37 C), height 5\' 6"  (1.676 m), weight 204 lb (92.5 kg), SpO2 98%. Body mass index is 32.93 kg/m.  DIAGNOSTIC DATA REVIEWED:  BMET    Component Value Date/Time   NA 140 12/30/2022 0928   K 4.9 12/30/2022 0928   CL 103 12/30/2022 0928   CO2 20 12/30/2022 0928   GLUCOSE 82 12/30/2022 0928   GLUCOSE 95 11/03/2021 0917   BUN 10 12/30/2022 0928   CREATININE 0.95 12/30/2022 0928   CREATININE 0.88 11/03/2021 0917   CALCIUM 9.6 12/30/2022 0928   GFRNONAA >60 02/08/2021 1244   GFRNONAA 85 07/18/2020 0937   GFRAA 98 07/18/2020 0937   Lab Results  Component Value Date   HGBA1C 5.5 12/30/2022   HGBA1C 5.9 (H) 01/07/2011   Lab Results  Component Value Date   INSULIN 13.5 12/30/2022   INSULIN 28.0 (H) 08/27/2020   Lab Results  Component Value Date   TSH 0.794 12/30/2022   CBC    Component Value Date/Time   WBC 8.1 01/18/2023 0920  RBC 5.06 01/18/2023 0920   HGB 15.5 01/18/2023 0920   HGB 15.6 05/04/2021 0742   HCT 47.4 (H) 01/18/2023 0920   HCT 47.5 (H) 05/04/2021 0742   PLT 364 01/18/2023 0920   PLT 325 05/04/2021 0742   MCV 93.7 01/18/2023 0920   MCV 93 05/04/2021 0742   MCH 30.6 01/18/2023 0920   MCHC 32.7 01/18/2023 0920   RDW 13.1 01/18/2023 0920   RDW 13.9 05/04/2021 0742   Iron Studies No results found for: "IRON",  "TIBC", "FERRITIN", "IRONPCTSAT" Lipid Panel     Component Value Date/Time   CHOL 167 12/30/2022 0928   TRIG 117 12/30/2022 0928   HDL 50 12/30/2022 0928   CHOLHDL 2.5 11/03/2021 0917   VLDL 29 03/29/2016 1224   LDLCALC 96 12/30/2022 0928   LDLCALC 77 11/03/2021 0917   Hepatic Function Panel     Component Value Date/Time   PROT 7.0 12/30/2022 0928   ALBUMIN 4.5 12/30/2022 0928   AST 16 12/30/2022 0928   ALT 16 12/30/2022 0928   ALKPHOS 71 12/30/2022 0928   BILITOT 0.4 12/30/2022 0928   BILIDIR 0.1 09/27/2016 0941   IBILI 0.2 09/27/2016 0941      Component Value Date/Time   TSH 0.794 12/30/2022 0928   Nutritional Lab Results  Component Value Date   VD25OH 64.9 12/30/2022   VD25OH 65.5 05/04/2021   VD25OH 45.7 08/27/2020     Assessment and Plan Assessment & Plan Obesity and Stress She is being treated for obesity and adheres to a category two eating plan 90% of the time. She has lost one pound in the last month and exercises regularly, performing 40 minutes of elliptical and biking five times per week. She is on Zetban 10 mg weekly and has concerns about its variable effectiveness, possibly due to injection technique. Stress, hormones, and dietary factors, such as increased simple carbohydrates, can affect the medication's efficacy. She is considering increasing the Zepbound dose to 12.5 mg due to periods of reduced effectiveness. The potential for increased queasiness or inability to eat with the higher dose was discussed, with a plan to revert to 10 mg if necessary. - Educate on the potential for stress and dietary factors to affect medication efficacy. - Encourage continuation of current exercise regimen. - Continue Cat 2 eating plan  Polyphagia She reports periods of increased hunger and snacking, associated with the variable effectiveness of her obesity medication. The impact of simple carbohydrates on insulin levels and hunger signals, which may reduce the  medication's effectiveness, was discussed. She is aware of her dietary habits and is making efforts to choose healthier snack options. - Educate on the impact of simple carbohydrates on hunger and medication efficacy. - Encourage continuation of choosing healthier snack options. - Increase Zepbound dose to 12.5 mg weekly. - Keep remaining Zepbound 10 mg doses on reserve in case of adverse effects from the increased dose.  General Health Maintenance She is actively engaged in managing her weight through diet and exercise. A low-calorie, high-protein dessert recipe was provided to support her dietary goals. She maintains a regular exercise routine and understands the importance of dietary choices in managing her weight. - Provide a low-calorie, high-protein dessert recipe. - Encourage continuation of current dietary and exercise habits.      She was informed of the importance of frequent follow up visits to maximize her success with intensive lifestyle modifications for her multiple health conditions.    Quillian Quince, MD

## 2023-04-27 ENCOUNTER — Other Ambulatory Visit: Payer: Self-pay

## 2023-04-27 ENCOUNTER — Other Ambulatory Visit (HOSPITAL_COMMUNITY): Payer: Self-pay

## 2023-04-30 ENCOUNTER — Other Ambulatory Visit (HOSPITAL_BASED_OUTPATIENT_CLINIC_OR_DEPARTMENT_OTHER): Payer: Self-pay

## 2023-05-05 ENCOUNTER — Other Ambulatory Visit: Payer: Self-pay

## 2023-05-05 ENCOUNTER — Other Ambulatory Visit: Payer: Self-pay | Admitting: Internal Medicine

## 2023-05-05 DIAGNOSIS — F411 Generalized anxiety disorder: Secondary | ICD-10-CM

## 2023-05-05 DIAGNOSIS — F4010 Social phobia, unspecified: Secondary | ICD-10-CM

## 2023-05-05 DIAGNOSIS — I10 Essential (primary) hypertension: Secondary | ICD-10-CM

## 2023-05-05 MED ORDER — BENAZEPRIL-HYDROCHLOROTHIAZIDE 20-12.5 MG PO TABS
1.0000 | ORAL_TABLET | Freq: Every day | ORAL | 1 refills | Status: AC
  Filled 2023-05-05: qty 90, 90d supply, fill #0
  Filled 2023-08-29: qty 90, 90d supply, fill #1

## 2023-05-09 ENCOUNTER — Encounter: Payer: Self-pay | Admitting: Internal Medicine

## 2023-05-09 ENCOUNTER — Ambulatory Visit (INDEPENDENT_AMBULATORY_CARE_PROVIDER_SITE_OTHER): Admitting: Family Medicine

## 2023-05-09 ENCOUNTER — Other Ambulatory Visit (HOSPITAL_BASED_OUTPATIENT_CLINIC_OR_DEPARTMENT_OTHER): Payer: Self-pay

## 2023-05-09 ENCOUNTER — Encounter (INDEPENDENT_AMBULATORY_CARE_PROVIDER_SITE_OTHER): Payer: Self-pay | Admitting: Family Medicine

## 2023-05-09 ENCOUNTER — Other Ambulatory Visit: Payer: Self-pay

## 2023-05-09 VITALS — BP 106/74 | HR 94 | Temp 98.1°F | Ht 66.0 in | Wt 202.0 lb

## 2023-05-09 DIAGNOSIS — E669 Obesity, unspecified: Secondary | ICD-10-CM

## 2023-05-09 DIAGNOSIS — R632 Polyphagia: Secondary | ICD-10-CM

## 2023-05-09 DIAGNOSIS — Z6832 Body mass index (BMI) 32.0-32.9, adult: Secondary | ICD-10-CM

## 2023-05-09 MED ORDER — VILAZODONE HCL 40 MG PO TABS
40.0000 mg | ORAL_TABLET | Freq: Every day | ORAL | 1 refills | Status: DC
  Filled 2023-05-09 – 2023-07-03 (×2): qty 90, 90d supply, fill #0
  Filled 2023-09-27: qty 90, 90d supply, fill #1

## 2023-05-09 MED ORDER — ZEPBOUND 12.5 MG/0.5ML ~~LOC~~ SOAJ
12.5000 mg | SUBCUTANEOUS | 0 refills | Status: DC
  Filled 2023-05-09: qty 2, 28d supply, fill #0

## 2023-05-09 NOTE — Progress Notes (Signed)
 Office: [phone]  /  Fax: [phone]  WEIGHT SUMMARY AND BIOMETRICS  Anthropometric Measurements Height: 5\' 6"  (1.676 m) Weight: 202 lb (91.6 kg) BMI (Calculated): 32.62 Weight at Last Visit: 204lb Weight Lost Since Last Visit: 2lb Weight Gained Since Last Visit: 0 Starting Weight: 273lb Total Weight Loss (lbs): 71 lb (32.2 kg) Peak Weight: 273lb   Body Composition  Body Fat %: 39.5 % Fat Mass (lbs): 79.8 lbs Muscle Mass (lbs): 116 lbs Total Body Water (lbs): 86.4 lbs Visceral Fat Rating : 9   Other Clinical Data Fasting: no Labs: no Today's Visit #: 36 Starting Date: 08/27/20    Chief Complaint: OBESITY    History of Present Illness Dana Washington is a 48 year old female who presents for obesity treatment and progress assessment.  She has been following a category two eating plan with 95% compliance and exercises 30 to 40 minutes, five times per week. She has achieved a total weight loss of 71 pounds since starting her program, with a 2-pound loss in the last month. Despite her efforts, she feels stressed and frustrated with the slow pace of her weight loss, especially when comparing her progress to a coworker who has lost weight rapidly despite poor eating habits. She acknowledges feeling healthier overall, with most health metrics in the normal range except for hematocrit, which she attributes to dehydration.  She is currently taking Zepbound  for polyphagia, which effectively controls her hunger. She experienced four days of severe stomach pain, initially attributing it to the medication, but it resolved with Xanax , suggesting stress as a possible cause. No significant gastrointestinal issues related to the medication are reported.  She has a history of constipation, previously having bowel movements once a week despite taking Miralax  twice daily. Recently, with the addition of Citrucel, bowel movements have increased to three times a week, indicating  improvement. Her diet includes a high protein intake, approximately 100 grams daily, with meals consisting of protein and vegetables. She does not count calories but estimates her intake might be slightly below 1000 calories per day. She incorporates snacks like yogurt and edamame for additional protein.      PHYSICAL EXAM:  Blood pressure 106/74, pulse 94, temperature 98.1 F (36.7 C), height 5\' 6"  (1.676 m), weight 202 lb (91.6 kg), SpO2 96%. Body mass index is 32.6 kg/m.  DIAGNOSTIC DATA REVIEWED:  BMET    Component Value Date/Time   NA 140 12/30/2022 0928   K 4.9 12/30/2022 0928   CL 103 12/30/2022 0928   CO2 20 12/30/2022 0928   GLUCOSE 82 12/30/2022 0928   GLUCOSE 95 11/03/2021 0917   BUN 10 12/30/2022 0928   CREATININE 0.95 12/30/2022 0928   CREATININE 0.88 11/03/2021 0917   CALCIUM  9.6 12/30/2022 0928   GFRNONAA >60 02/08/2021 1244   GFRNONAA 85 07/18/2020 0937   GFRAA 98 07/18/2020 0937   Lab Results  Component Value Date   HGBA1C 5.5 12/30/2022   HGBA1C 5.9 (H) 01/07/2011   Lab Results  Component Value Date   INSULIN  13.5 12/30/2022   INSULIN  28.0 (H) 08/27/2020   Lab Results  Component Value Date   TSH 0.794 12/30/2022   CBC    Component Value Date/Time   WBC 8.1 01/18/2023 0920   RBC 5.06 01/18/2023 0920   HGB 15.5 01/18/2023 0920   HGB 15.6 05/04/2021 0742   HCT 47.4 (H) 01/18/2023 0920   HCT 47.5 (H) 05/04/2021 0742   PLT 364 01/18/2023 0920   PLT 325  05/04/2021 0742   MCV 93.7 01/18/2023 0920   MCV 93 05/04/2021 0742   MCH 30.6 01/18/2023 0920   MCHC 32.7 01/18/2023 0920   RDW 13.1 01/18/2023 0920   RDW 13.9 05/04/2021 0742   Iron Studies No results found for: "IRON", "TIBC", "FERRITIN", "IRONPCTSAT" Lipid Panel     Component Value Date/Time   CHOL 167 12/30/2022 0928   TRIG 117 12/30/2022 0928   HDL 50 12/30/2022 0928   CHOLHDL 2.5 11/03/2021 0917   VLDL 29 03/29/2016 1224   LDLCALC 96 12/30/2022 0928   LDLCALC 77 11/03/2021  0917   Hepatic Function Panel     Component Value Date/Time   PROT 7.0 12/30/2022 0928   ALBUMIN 4.5 12/30/2022 0928   AST 16 12/30/2022 0928   ALT 16 12/30/2022 0928   ALKPHOS 71 12/30/2022 0928   BILITOT 0.4 12/30/2022 0928   BILIDIR 0.1 09/27/2016 0941   IBILI 0.2 09/27/2016 0941      Component Value Date/Time   TSH 0.794 12/30/2022 0928   Nutritional Lab Results  Component Value Date   VD25OH 64.9 12/30/2022   VD25OH 65.5 05/04/2021   VD25OH 45.7 08/27/2020     Assessment and Plan Assessment & Plan Obesity Obesity management focuses on gradual weight loss. She has lost 71 pounds since starting the program, with a 2-pound loss in the last month. She adheres to a category two eating plan 95% of the time and exercises 30-40 minutes, five times per week. Despite frustration with the slow pace, she is reminded of the benefits of gradual weight loss to prevent regain. Her diet is high in protein and low in calories, aimed at maintaining muscle mass and metabolism. Monitoring caloric intake is advised to prevent metabolic slowdown. - Continue category two eating plan. - Continue exercise regimen of 30-40 minutes, five times per week. - Ensure caloric intake is at least 1000 calories per day. - Consider adding healthy fats to diet if needed to increase caloric intake. - Check metabolism in the summer to assess current status.  Polyphagia Polyphagia is managed with Zepbound . She reports no significant gastrointestinal issues, though transient stomach pain likely due to stress. Constipation is managed with Citrucel and Miralax , improving bowel movements. Hunger is well controlled with the current regimen. - Refill Zepbound  at 12.5 mg. - Continue Citrucel and Miralax  for constipation management. - Monitor for gastrointestinal side effects or changes in hunger levels.      She was informed of the importance of frequent follow up visits to maximize her success with intensive  lifestyle modifications for her multiple health conditions.    Jasmine Mesi, MD

## 2023-05-09 NOTE — Telephone Encounter (Signed)
 Spoke with patient by phone today. Her therapist at [facility] has left the practice. She was prescribing Viibryd  40 mg daily for her. New therapist does not prescribe.   Patient reports she is doing well on this medication with no side effects.   Will refill medication for 6 months today. #90 tabs with on refill.   She will need  an office visit in the Fall for refills. Will need to be seen in October 2025 for med check. She should plan in advance for an appt.   Her CPE is due January 2026 but I want to see her in the office this coming Fall around October for Med check.  MJB, MD

## 2023-05-10 ENCOUNTER — Other Ambulatory Visit (HOSPITAL_COMMUNITY): Payer: Self-pay

## 2023-05-28 ENCOUNTER — Other Ambulatory Visit (HOSPITAL_BASED_OUTPATIENT_CLINIC_OR_DEPARTMENT_OTHER): Payer: Self-pay

## 2023-05-30 ENCOUNTER — Other Ambulatory Visit (HOSPITAL_COMMUNITY): Payer: Self-pay

## 2023-06-13 ENCOUNTER — Encounter (INDEPENDENT_AMBULATORY_CARE_PROVIDER_SITE_OTHER): Payer: Self-pay | Admitting: Family Medicine

## 2023-06-13 ENCOUNTER — Other Ambulatory Visit (HOSPITAL_BASED_OUTPATIENT_CLINIC_OR_DEPARTMENT_OTHER): Payer: Self-pay

## 2023-06-13 ENCOUNTER — Ambulatory Visit (INDEPENDENT_AMBULATORY_CARE_PROVIDER_SITE_OTHER): Admitting: Family Medicine

## 2023-06-13 VITALS — BP 103/72 | HR 97 | Temp 97.8°F | Ht 66.0 in | Wt 200.0 lb

## 2023-06-13 DIAGNOSIS — K5900 Constipation, unspecified: Secondary | ICD-10-CM | POA: Diagnosis not present

## 2023-06-13 DIAGNOSIS — E669 Obesity, unspecified: Secondary | ICD-10-CM

## 2023-06-13 DIAGNOSIS — Z6832 Body mass index (BMI) 32.0-32.9, adult: Secondary | ICD-10-CM | POA: Diagnosis not present

## 2023-06-13 DIAGNOSIS — R632 Polyphagia: Secondary | ICD-10-CM

## 2023-06-13 DIAGNOSIS — K5909 Other constipation: Secondary | ICD-10-CM

## 2023-06-13 DIAGNOSIS — E66813 Obesity, class 3: Secondary | ICD-10-CM

## 2023-06-13 MED ORDER — TIRZEPATIDE-WEIGHT MANAGEMENT 10 MG/0.5ML ~~LOC~~ SOAJ
10.0000 mg | SUBCUTANEOUS | 0 refills | Status: DC
  Filled 2023-06-13: qty 2, 28d supply, fill #0

## 2023-06-13 NOTE — Progress Notes (Signed)
 Office: [phone]  /  Fax: [phone]  WEIGHT SUMMARY AND BIOMETRICS  Anthropometric Measurements Height: 5\' 6"  (1.676 m) Weight: 200 lb (90.7 kg) BMI (Calculated): 32.3 Weight at Last Visit: 202 lb Weight Lost Since Last Visit: 2 lb Weight Gained Since Last Visit: 0 Starting Weight: 273 lb Total Weight Loss (lbs): 73 lb (33.1 kg) Peak Weight: 273 lb   Body Composition  Body Fat %: 39 % Fat Mass (lbs): 78 lbs Muscle Mass (lbs): 115.8 lbs Total Body Water (lbs): 85 lbs Visceral Fat Rating : 9   Other Clinical Data Fasting: no Labs: no Today's Visit #: 37 Starting Date: 08/27/20    Chief Complaint: OBESITY    History of Present Illness Dana Washington is a 48 year old female with obesity who presents for a follow-up on her treatment plan and progress.  She is adhering to a category two eating plan 95% of the time and has achieved a total weight loss of 75 pounds since starting the program, with a two-pound loss in the last month. Her exercise regimen includes strengthening exercises such as leg lifts, power squats, and wall pushups, although she has not consistently performed 30 minutes of exercise seven times per week. She substitutes with activities like clamshells and under-the-desk elliptical use due to life circumstances.  She experiences issues with hunger, feeling that her current medication dose is too high, leading to excessive fullness and dehydration. This has exacerbated her constipation, despite using Citrucel and Miralax . She has a history of bowel concerns and has undergone a colonoscopy, with previous advice that her bowel habits might be normal for her. However, recent readings about bowel obstructions have increased her anxiety about her condition.  She experiences significant stress at work due to short staffing and challenges in getting travel positions approved, which adds to her overall stress levels. She describes the work environment as  having reduced staff compared to when she started, impacting her ability to manage tasks effectively.      PHYSICAL EXAM:  Blood pressure 103/72, pulse 97, temperature 97.8 F (36.6 C), height 5\' 6"  (1.676 m), weight 200 lb (90.7 kg), SpO2 98%. Body mass index is 32.28 kg/m.  DIAGNOSTIC DATA REVIEWED:  BMET    Component Value Date/Time   NA 140 12/30/2022 0928   K 4.9 12/30/2022 0928   CL 103 12/30/2022 0928   CO2 20 12/30/2022 0928   GLUCOSE 82 12/30/2022 0928   GLUCOSE 95 11/03/2021 0917   BUN 10 12/30/2022 0928   CREATININE 0.95 12/30/2022 0928   CREATININE 0.88 11/03/2021 0917   CALCIUM  9.6 12/30/2022 0928   GFRNONAA >60 02/08/2021 1244   GFRNONAA 85 07/18/2020 0937   GFRAA 98 07/18/2020 0937   Lab Results  Component Value Date   HGBA1C 5.5 12/30/2022   HGBA1C 5.9 (H) 01/07/2011   Lab Results  Component Value Date   INSULIN  13.5 12/30/2022   INSULIN  28.0 (H) 08/27/2020   Lab Results  Component Value Date   TSH 0.794 12/30/2022   CBC    Component Value Date/Time   WBC 8.1 01/18/2023 0920   RBC 5.06 01/18/2023 0920   HGB 15.5 01/18/2023 0920   HGB 15.6 05/04/2021 0742   HCT 47.4 (H) 01/18/2023 0920   HCT 47.5 (H) 05/04/2021 0742   PLT 364 01/18/2023 0920   PLT 325 05/04/2021 0742   MCV 93.7 01/18/2023 0920   MCV 93 05/04/2021 0742   MCH 30.6 01/18/2023 0920   MCHC 32.7 01/18/2023 0920  RDW 13.1 01/18/2023 0920   RDW 13.9 05/04/2021 0742   Iron Studies No results found for: "IRON", "TIBC", "FERRITIN", "IRONPCTSAT" Lipid Panel     Component Value Date/Time   CHOL 167 12/30/2022 0928   TRIG 117 12/30/2022 0928   HDL 50 12/30/2022 0928   CHOLHDL 2.5 11/03/2021 0917   VLDL 29 03/29/2016 1224   LDLCALC 96 12/30/2022 0928   LDLCALC 77 11/03/2021 0917   Hepatic Function Panel     Component Value Date/Time   PROT 7.0 12/30/2022 0928   ALBUMIN 4.5 12/30/2022 0928   AST 16 12/30/2022 0928   ALT 16 12/30/2022 0928   ALKPHOS 71 12/30/2022 0928    BILITOT 0.4 12/30/2022 0928   BILIDIR 0.1 09/27/2016 0941   IBILI 0.2 09/27/2016 0941      Component Value Date/Time   TSH 0.794 12/30/2022 0928   Nutritional Lab Results  Component Value Date   VD25OH 64.9 12/30/2022   VD25OH 65.5 05/04/2021   VD25OH 45.7 08/27/2020     Assessment and Plan Assessment & Plan Obesity Obesity management is ongoing with a focus on weight loss and lifestyle modification. She has lost 75 pounds since starting the program, with a recent loss of 2 pounds in the last month. She is following a category two eating plan 95% of the time and engaging in strengthening exercises such as leg lifts, power squats, and wall pushups for 30 minutes seven times per week. The rate of weight loss is appropriate given her current weight and activity level. Emphasized the importance of sustainable lifestyle changes over rapid weight loss. - Continue category two eating plan - Continue strengthening exercises - Encourage sustainable lifestyle changes  Polyphagia Current medication Zepbound  dose is too high, leading to excessive fullness and decreased fluid intake, contributing to constipation. Discussed the potential for bowel obstruction with high doses and the importance of adjusting the dose to manage symptoms effectively. She expressed concern about bowel obstruction after reading an article, but reassured that she is not at high risk. - Decrease Zepbound  to 10 mg and follow up in 1 month  Constipation Constipation is slightly worse despite the use of Citrucel and Miralax . She is performing colonic massages and is concerned about bowel obstruction. Reassured that she is not at high risk for obstruction and emphasized the importance of hydration and monitoring for signs of obstruction. - Continue Citrucel and Miralax  - Encourage hydration - Monitor for signs of obstruction      She was informed of the importance of frequent follow up visits to maximize her success  with intensive lifestyle modifications for her multiple health conditions.    Jasmine Mesi, MD

## 2023-06-15 ENCOUNTER — Other Ambulatory Visit (HOSPITAL_COMMUNITY): Payer: Self-pay

## 2023-06-19 ENCOUNTER — Other Ambulatory Visit: Payer: Self-pay | Admitting: Internal Medicine

## 2023-06-20 ENCOUNTER — Other Ambulatory Visit (HOSPITAL_COMMUNITY): Payer: Self-pay

## 2023-06-20 ENCOUNTER — Encounter (INDEPENDENT_AMBULATORY_CARE_PROVIDER_SITE_OTHER): Payer: Self-pay | Admitting: Family Medicine

## 2023-06-20 ENCOUNTER — Other Ambulatory Visit: Payer: Self-pay

## 2023-06-20 MED ORDER — ROSUVASTATIN CALCIUM 20 MG PO TABS
20.0000 mg | ORAL_TABLET | Freq: Every day | ORAL | 3 refills | Status: AC
  Filled 2023-06-20: qty 90, 90d supply, fill #0
  Filled 2023-09-19: qty 90, 90d supply, fill #1
  Filled 2023-12-14: qty 90, 90d supply, fill #2

## 2023-06-24 ENCOUNTER — Other Ambulatory Visit: Payer: Self-pay

## 2023-06-24 ENCOUNTER — Encounter: Payer: Self-pay | Admitting: Pharmacist

## 2023-06-24 ENCOUNTER — Other Ambulatory Visit: Payer: Self-pay | Admitting: Internal Medicine

## 2023-06-24 ENCOUNTER — Other Ambulatory Visit (HOSPITAL_COMMUNITY): Payer: Self-pay

## 2023-06-24 MED ORDER — SYNTHROID 100 MCG PO TABS
100.0000 ug | ORAL_TABLET | Freq: Every day | ORAL | 1 refills | Status: AC
  Filled 2023-06-24: qty 90, 90d supply, fill #0

## 2023-06-27 ENCOUNTER — Encounter: Payer: Self-pay | Admitting: Internal Medicine

## 2023-06-27 ENCOUNTER — Other Ambulatory Visit: Payer: Self-pay

## 2023-06-27 ENCOUNTER — Other Ambulatory Visit (HOSPITAL_BASED_OUTPATIENT_CLINIC_OR_DEPARTMENT_OTHER): Payer: Self-pay

## 2023-06-27 MED ORDER — SYNTHROID 100 MCG PO TABS: 100.0000 ug | ORAL_TABLET | Freq: Every day | ORAL | 1 refills | Status: DC

## 2023-06-29 ENCOUNTER — Telehealth: Payer: 59 | Admitting: Neurology

## 2023-07-04 ENCOUNTER — Other Ambulatory Visit (HOSPITAL_COMMUNITY): Payer: Self-pay

## 2023-07-11 ENCOUNTER — Encounter (INDEPENDENT_AMBULATORY_CARE_PROVIDER_SITE_OTHER): Payer: Self-pay | Admitting: Family Medicine

## 2023-07-11 ENCOUNTER — Ambulatory Visit (INDEPENDENT_AMBULATORY_CARE_PROVIDER_SITE_OTHER): Admitting: Family Medicine

## 2023-07-11 ENCOUNTER — Other Ambulatory Visit (HOSPITAL_BASED_OUTPATIENT_CLINIC_OR_DEPARTMENT_OTHER): Payer: Self-pay

## 2023-07-11 ENCOUNTER — Other Ambulatory Visit: Payer: Self-pay

## 2023-07-11 VITALS — BP 108/72 | HR 114 | Temp 97.9°F | Ht 66.0 in | Wt 200.0 lb

## 2023-07-11 DIAGNOSIS — E559 Vitamin D deficiency, unspecified: Secondary | ICD-10-CM | POA: Diagnosis not present

## 2023-07-11 DIAGNOSIS — I10 Essential (primary) hypertension: Secondary | ICD-10-CM

## 2023-07-11 DIAGNOSIS — R632 Polyphagia: Secondary | ICD-10-CM

## 2023-07-11 DIAGNOSIS — Z6832 Body mass index (BMI) 32.0-32.9, adult: Secondary | ICD-10-CM

## 2023-07-11 DIAGNOSIS — E669 Obesity, unspecified: Secondary | ICD-10-CM

## 2023-07-11 DIAGNOSIS — E88819 Insulin resistance, unspecified: Secondary | ICD-10-CM | POA: Insufficient documentation

## 2023-07-11 DIAGNOSIS — E785 Hyperlipidemia, unspecified: Secondary | ICD-10-CM | POA: Diagnosis not present

## 2023-07-11 DIAGNOSIS — E038 Other specified hypothyroidism: Secondary | ICD-10-CM

## 2023-07-11 DIAGNOSIS — E7849 Other hyperlipidemia: Secondary | ICD-10-CM

## 2023-07-11 MED ORDER — TIRZEPATIDE-WEIGHT MANAGEMENT 10 MG/0.5ML ~~LOC~~ SOAJ
10.0000 mg | SUBCUTANEOUS | 0 refills | Status: DC
Start: 2023-07-11 — End: 2023-08-10
  Filled 2023-07-11: qty 2, 28d supply, fill #0

## 2023-07-11 NOTE — Progress Notes (Signed)
 Office: [phone]  /  Fax: [phone]  WEIGHT SUMMARY AND BIOMETRICS  Anthropometric Measurements Height: 5' 6 (1.676 m) Weight: 200 lb (90.7 kg) BMI (Calculated): 32.3 Weight at Last Visit: 200 lb Weight Lost Since Last Visit: 0 Weight Gained Since Last Visit: 0 Starting Weight: 273 lb Total Weight Loss (lbs): 73 lb (33.1 kg) Peak Weight: 273 lb   Body Composition  Body Fat %: 39.5 % Fat Mass (lbs): 79.2 lbs Muscle Mass (lbs): 115.2 lbs Total Body Water (lbs): 86.2 lbs Visceral Fat Rating : 9   Other Clinical Data Fasting: No Labs: Yes Today's Visit #: 38 Starting Date: 08/27/20    Chief Complaint: OBESITY   History of Present Illness Dana Washington is a 48 year old female who presents for obesity treatment plan assessment and progress evaluation.  She has been following a category two eating plan with 95% compliance and exercises 20 to 45 minutes five times a week using a combination of elliptical and treadmill. Her weight loss has been maintained over the last four weeks since her last visit.  She manages insulin  resistance with diet and exercise and takes Earma off-label to improve her insulin  resistance. No new symptoms related to insulin  resistance have been reported.  She experiences anxiety related to stress but denies significant changes in mood, describing her mood as 'okay' and 'pretty stable'. She recently had a menstrual period despite not having had one for years due to birth control pills, attributing it to stress.  Her eating habits include struggling with eating patterns on weekends, often not eating during the day and then overeating at dinner, which sometimes causes nausea. During the week, she follows a regimented eating schedule, consuming eggs, protein shakes, yogurt, and deli meat out of habit rather than desire.  She had a past episode of gastrointestinal discomfort after consuming high protein pasta, attributed to overeating  after not eating for a prolonged period. This episode occurred after increasing her Zepbound  dose to 12.5 mg, but she has not experienced similar symptoms since.  She takes over-the-counter vitamin D  at a dose of 2000 IU daily. She has been breaking her hydrochlorothiazide  and benazepril  in half for several months due to dizziness, which she attributes to low blood pressure. She monitors her blood pressure regularly and reports it has not been 'freakishly high or low'.  She has a family history of high blood pressure and heart attack (grandfather) and is concerned about her father's potential dementia.      PHYSICAL EXAM:  Blood pressure 108/72, pulse (!) 114, temperature 97.9 F (36.6 C), height 5' 6 (1.676 m), weight 200 lb (90.7 kg), SpO2 97%. Body mass index is 32.28 kg/m.  DIAGNOSTIC DATA REVIEWED:  BMET    Component Value Date/Time   NA 140 12/30/2022 0928   K 4.9 12/30/2022 0928   CL 103 12/30/2022 0928   CO2 20 12/30/2022 0928   GLUCOSE 82 12/30/2022 0928   GLUCOSE 95 11/03/2021 0917   BUN 10 12/30/2022 0928   CREATININE 0.95 12/30/2022 0928   CREATININE 0.88 11/03/2021 0917   CALCIUM  9.6 12/30/2022 0928   GFRNONAA >60 02/08/2021 1244   GFRNONAA 85 07/18/2020 0937   GFRAA 98 07/18/2020 0937   Lab Results  Component Value Date   HGBA1C 5.5 12/30/2022   HGBA1C 5.9 (H) 01/07/2011   Lab Results  Component Value Date   INSULIN  13.5 12/30/2022   INSULIN  28.0 (H) 08/27/2020   Lab Results  Component Value Date   TSH  0.794 12/30/2022   CBC    Component Value Date/Time   WBC 8.1 01/18/2023 0920   RBC 5.06 01/18/2023 0920   HGB 15.5 01/18/2023 0920   HGB 15.6 05/04/2021 0742   HCT 47.4 (H) 01/18/2023 0920   HCT 47.5 (H) 05/04/2021 0742   PLT 364 01/18/2023 0920   PLT 325 05/04/2021 0742   MCV 93.7 01/18/2023 0920   MCV 93 05/04/2021 0742   MCH 30.6 01/18/2023 0920   MCHC 32.7 01/18/2023 0920   RDW 13.1 01/18/2023 0920   RDW 13.9 05/04/2021 0742   Iron  Studies No results found for: IRON, TIBC, FERRITIN, IRONPCTSAT Lipid Panel     Component Value Date/Time   CHOL 167 12/30/2022 0928   TRIG 117 12/30/2022 0928   HDL 50 12/30/2022 0928   CHOLHDL 2.5 11/03/2021 0917   VLDL 29 03/29/2016 1224   LDLCALC 96 12/30/2022 0928   LDLCALC 77 11/03/2021 0917   Hepatic Function Panel     Component Value Date/Time   PROT 7.0 12/30/2022 0928   ALBUMIN 4.5 12/30/2022 0928   AST 16 12/30/2022 0928   ALT 16 12/30/2022 0928   ALKPHOS 71 12/30/2022 0928   BILITOT 0.4 12/30/2022 0928   BILIDIR 0.1 09/27/2016 0941   IBILI 0.2 09/27/2016 0941      Component Value Date/Time   TSH 0.794 12/30/2022 0928   Nutritional Lab Results  Component Value Date   VD25OH 64.9 12/30/2022   VD25OH 65.5 05/04/2021   VD25OH 45.7 08/27/2020     Assessment and Plan Assessment & Plan Obesity She adheres to a category two eating plan 95% of the time and exercises 20-45 minutes five times a week using elliptical and treadmill. Weight loss has been maintained over the last four weeks. Challenges with weekend eating habits lead to overeating and subsequent sickness. Strategies to manage weekend eating habits were discussed, emphasizing breaking the all-or-nothing cycle and finding enjoyable yet nutritious meals. - Continue category two eating plan - Continue exercise routine - Implement strategies to manage weekend eating habits  Insulin  Resistance Insulin  resistance is managed with diet, exercise, and off-label use of Xeljanz. Insulin -related labs will be monitored to assess progress and adjust treatment as necessary. Emphasis on maintaining a balanced diet and regular exercise as part of overall health maintenance. - Continue Earma - Order insulin -related labs  Hypertension She has been on antihypertensive medication since age 36 and has been self-adjusting her medication by taking half doses due to dizziness. Current blood pressure is low, and the  reduced dosage is appropriate. Blood pressure will be monitored, and medication adjusted as needed. She is advised to continue hydrating and report any further dizziness. - Decrease Lotensin  hydrochlorothiazide  to half a pill per day - Monitor blood pressure - Order CMP to check kidneys, liver, and electrolytes  Hyperlipidemia Hyperlipidemia is managed with diet and exercise. Previous labs showed improvement in triglycerides. Lipid levels will be monitored to ensure continued improvement. - Order lipid panel  Vitamin D  Deficiency She is taking 2000 IU of over-the-counter vitamin D  daily. Vitamin D  levels will be monitored to ensure they remain within the optimal range and avoid over-replacement. - Order vitamin D  level    She was informed of the importance of frequent follow up visits to maximize her success with intensive lifestyle modifications for her multiple health conditions.    Louann Penton, MD

## 2023-07-12 ENCOUNTER — Telehealth (INDEPENDENT_AMBULATORY_CARE_PROVIDER_SITE_OTHER): Admitting: Neurology

## 2023-07-12 DIAGNOSIS — G43009 Migraine without aura, not intractable, without status migrainosus: Secondary | ICD-10-CM

## 2023-07-12 DIAGNOSIS — G43709 Chronic migraine without aura, not intractable, without status migrainosus: Secondary | ICD-10-CM | POA: Diagnosis not present

## 2023-07-12 NOTE — Progress Notes (Unsigned)
 [facility]    Provider:  Dr Washington Requesting Provider: Perri Ronal PARAS, MD Primary Care Provider:  Perri Ronal PARAS, MD  CC:  Migraines  Virtual Visit via Telephone Note  I connected with Dana Washington on 07/13/23 at  2:30 PM EDT by telephone and verified that I am speaking with the correct person using two identifiers.  Location: Patient: home Provider: work   I discussed the limitations, risks, security and privacy concerns of performing an evaluation and management service by telephone and the availability of in person appointments. I also discussed with the patient that there may be a patient responsible charge related to this service. The patient expressed understanding and agreed to proceed.    Follow Up Instructions:    I discussed the assessment and treatment plan with the patient. The patient was provided an opportunity to ask questions and all were answered. The patient agreed with the plan and demonstrated an understanding of the instructions.   The patient was advised to call back or seek an in-person evaluation if the symptoms worsen or if the condition fails to improve as anticipated.  I provided 10 minutes of non-face-to-face time during this encounter.   Dana KATHEE Ines, MD    07/12/2023: we started Ajovy , ubrelvy . Lovely patient! So thankful and kind. Placed her on ajovy  and ubrelvy  last appointment. She feels like a changed person. Appreciate her very much and so happy she is significantly better.  At baseline more than 10 migraine days a month and a total of >15 total headache days a month and now improved to 4 mild migraine days a mon and < 5 total headache days a month. No other focal neurologic deficits, associated symptoms, inciting events or modifiable factors.Patient complains of symptoms per HPI as well as the following symptoms: none . Pertinent negatives and positives per HPI. All others negative   12/29/2022: She starts with  a headache randomly bc of dehydration maybe or weather. Then it can turn into a migraine. Excedrin helps but she may proceed to nausea and vomiting.  She has 10 migraines a month. Manager of non-invasive now. Nurtec helps but she is running after 10 migraine days a month and a total of >15 total headache days a month for the last 6 months.   HPI 12/23/2021:  Dana Washington is a 48 y.o. female here as requested by Perri Ronal PARAS, MD for migraines. Has had them for about 20 years. She saw a neurologist in [place] over 11 years ago. A few days before the migraine blurry vision, feel like you need reading glasses, go on a few days and then get a migraine. Her whole entire head is ready to explode, irritability, everything bothers her, nausea, vomiting, photophobia, pulsating/pounding/throbbing, she usually so severe she cries. In thepast she used Vicodin and sleep it off. She gets 1-2 migraines a year. She tried fioricet and it never worked well she got 30 pills in Jan 2021 and lasted her 3 years. Feels like migraines are actually becoming less frequent. No new symptoms. Usually weather changes. Now with hormones may be helping her with pre-menopause. Oxycodone  does not work. No other focal neurologic deficits, associated symptoms, inciting events or modifiable factors.  Reviewed notes, labs and imaging from outside physicians, which showed:     Latest Ref Rng & Units 01/18/2023    9:20 AM 11/03/2021    9:17 AM 05/04/2021    7:42 AM  CBC  WBC 3.8 - 10.8 Thousand/uL 8.1  6.3  6.6   Hemoglobin 11.7 - 15.5 g/dL 84.4  83.7  84.3   Hematocrit 35.0 - 45.0 % 47.4  47.9  47.5   Platelets 140 - 400 Thousand/uL 364  395  325        Latest Ref Rng & Units 12/30/2022    9:28 AM 11/03/2021    9:17 AM 05/04/2021    7:42 AM  CMP  Glucose 70 - 99 mg/dL 82  95  94   BUN 6 - 24 mg/dL 10  13  17    Creatinine 0.57 - 1.00 mg/dL 9.04  9.11  9.01   Sodium 134 - 144 mmol/L 140  140  141   Potassium 3.5 - 5.2  mmol/L 4.9  4.8  4.9   Chloride 96 - 106 mmol/L 103  104  104   CO2 20 - 29 mmol/L 20  25  24    Calcium  8.7 - 10.2 mg/dL 9.6  9.8  9.2   Total Protein 6.0 - 8.5 g/dL 7.0  7.6  7.0   Total Bilirubin 0.0 - 1.2 mg/dL 0.4  0.3  0.3   Alkaline Phos 44 - 121 IU/L 71   82   AST 0 - 40 IU/L 16  17  18    ALT 0 - 32 IU/L 16  16  15       Review of Systems: Patient complains of symptoms per HPI as well as the following symptoms stable migraines. Pertinent negatives and positives per HPI. All others negative.   Social History   Socioeconomic History   Marital status: Single    Spouse name: n/a   Number of children: 0   Years of education: Not on file   Highest education level: Not on file  Occupational History   Occupation: Pediatric ECHO sonographer    Employer: [employer]  Tobacco Use   Smoking status: Never   Smokeless tobacco: Never  Vaping Use   Vaping status: Never Used  Substance and Sexual Activity   Alcohol use: Yes    Comment: social   Drug use: No   Sexual activity: Not Currently    Birth control/protection: Pill  Other Topics Concern   Not on file  Social History Narrative   Lived alone until her mother moved in (retired and moved from [place]).   Her sister and father also live locally.   Caffeine : 2 cups coffee/day   Social Drivers of Corporate investment banker Strain: Not on file  Food Insecurity: Not on file  Transportation Needs: Not on file  Physical Activity: Not on file  Stress: Not on file  Social Connections: Not on file  Intimate Partner Violence: Not on file    Family History  Problem Relation Age of Onset   High Cholesterol Mother    Obesity Mother    Thyroid  disease Mother    High Cholesterol Father    Cancer Father    Obesity Father    ADD / ADHD Sister    Migraines Sister    Heart disease Maternal Grandfather    Colon cancer Maternal Grandfather        ds in his late 48's   Heart disease Paternal Grandmother    Esophageal cancer  Other        mat great aunt, non-smoker   ADD / ADHD Niece    Rectal cancer Neg Hx    Stomach cancer Neg Hx    Colon polyps Neg Hx     Past Medical  History:  Diagnosis Date   Allergy    Anxiety    Colon polyps    Constipation    Diabetes mellitus without complication (HCC)    Fundic gland polyps of stomach, benign    GERD (gastroesophageal reflux disease)    Glucose intolerance (impaired glucose tolerance)    Herpes dermatitis    History of motion sickness    Hx of Hashimoto thyroiditis    Hx of sessile serrated colonic polyps 03/04/2021   Hyperlipidemia    Hypertension    Hypothyroidism, secondary    Impingement syndrome of right shoulder    Insomnia    Metabolic syndrome    Migraine    OA (osteoarthritis)    RIGHT SHOULDER AC JOINT   Obese    OCD (obsessive compulsive disorder)    PONV (postoperative nausea and vomiting)    SEVERE after breast reduction   TMJ (dislocation of temporomandibular joint)    Vitamin D  deficiency    Vitamin D  deficiency     Patient Active Problem List   Diagnosis Date Noted   Migraine without aura and without status migrainosus, not intractable 07/13/2023   Insulin  resistance 07/11/2023   Arterial hypotension 11/24/2022   BMI 32.0-32.9,adult 10/27/2022   Obesity with beginning BMI of 45 10/27/2022   Stress 04/29/2022   BMI 38.0-38.9,adult 03/10/2022   Depression 03/10/2022   Polyphagia 03/10/2022   Other constipation 02/03/2022   BMI 40.0-44.9, adult (HCC) 02/03/2022   Chronic migraine without aura without status migrainosus, not intractable 12/23/2021   Other hyperlipidemia 06/01/2021   Essential hypertension 06/01/2021   Hx of sessile serrated colonic polyps 03/04/2021   Prediabetes 08/28/2020   S/P left knee arthroscopy 10/25/2017   Knee pain 10/03/2017   Hashimoto's thyroiditis 08/05/2017   Vitamin D  deficiency 04/11/2015   OCD (obsessive compulsive disorder) 07/09/2013   Anxiety 07/09/2013   Hypertension 07/10/2011    Depression with anxiety 06/11/2011   History of migraine headaches 04/08/2011   Hyperlipidemia 01/07/2011   Impaired glucose tolerance 01/07/2011   Generalized obesity 01/07/2011   Hypothyroidism 01/07/2011    Past Surgical History:  Procedure Laterality Date   biopsy of lymph node  FEB 2010   BENIGN   BREAST BIOPSY Bilateral    BREAST EXCISIONAL BIOPSY Left    benign   BREAST REDUCTION SURGERY Bilateral MAY 2010   CARPAL TUNNEL RELEASE Bilateral 2012   CHONDROPLASTY Left 10/25/2017   Procedure: CHONDROPLASTY;  Surgeon: Gerome Charleston, MD;  Location[facility];  Service: Orthopedics;  Laterality: Left;   EXCISION MASS ABDOMINAL N/A 04/14/2017   Procedure: EXCISION OF SUBCUTANEOUS LIPOMA OF LEFT UPPER ABDOMINAL WALL;  Surgeon: Belinda Cough, MD;  Location: [facility];  Service: General;  Laterality: N/A;   KNEE ARTHROSCOPY WITH MEDIAL MENISECTOMY Left 10/25/2017   Procedure: LEFT KNEE ARTHROSCOPY WITH MEDIAL AND LATERAL MENISECTOMY;  Surgeon: Gerome Charleston, MD;  Location: [facility];  Service: Orthopedics;  Laterality: Left;   LASIK  2008   REDUCTION MAMMAPLASTY Bilateral    SHOULDER ARTHROSCOPY WITH ROTATOR CUFF REPAIR AND SUBACROMIAL DECOMPRESSION Right 09/19/2012   Procedure: RIGHT SHOULDER EXAMINE UNDER ANESTHESIA, ARTHROSCOPY,  DEBRIDEMENT, SUBACROMIAL DECOMPRESSION, DISTAL CLAVICLE RESECTION AND  ROTATOR CUFF REPAIR, LABRAL REPAIR ;  Surgeon: Charleston Gerome, MD;  Location: [facility];  Service: Orthopedics;  Laterality: Right;   WISDOM TOOTH EXTRACTION  AGE 55    Current Outpatient Medications  Medication Sig Dispense Refill   Fremanezumab -vfrm (AJOVY ) 225 MG/1.5ML SOAJ Inject 225 mg  into the skin every 30 (thirty) days. Please run copay card BIN# [id number] PCN# [id number] GRP# [id number] ID# [id number] EXP 01/11/2024 1.5 mL 11   Ubrogepant  (UBRELVY ) 100 MG TABS Take 1 tablet (100 mg total) by mouth every 2 (two) hours as  needed. Maximum 200mg  a day. Please run copay card: BIN [id number] PCN CNRX GRP [id number] ID [id number] EXP 01/11/2024 16 tablet 11   ALPRAZolam  (XANAX ) 0.5 MG tablet Take 1 tablet (0.5 mg total) by mouth 2 (two) times daily as needed for anxiety. 60 tablet 1   benazepril -hydrochlorthiazide (LOTENSIN  HCT) 20-12.5 MG tablet Take 1 tablet by mouth daily. 90 tablet 1   cholecalciferol (VITAMIN D3) 25 MCG (1000 UNIT) tablet Take 1,000 Units by mouth daily.     Fremanezumab -vfrm (AJOVY ) 225 MG/1.5ML SOAJ Inject 225 mg into the skin every 30 (thirty) days. 1.5 mL 11   norethindrone -ethinyl estradiol -iron (BLISOVI  FE 1.5/30) 1.5-30 MG-MCG tablet Take 1 tablet by mouth daily. 84 tablet 4   omeprazole  (PRILOSEC) 20 MG capsule Take 1 capsule (20 mg total) by mouth daily. 90 capsule 3   polyethylene glycol powder (GLYCOLAX /MIRALAX ) 17 GM/SCOOP powder Take 17 g by mouth 2 (two) times daily as needed. 3350 g 1   Rimegepant Sulfate  (NURTEC) 75 MG TBDP Take 1 tablet (75 mg total) by mouth daily as needed. For migraines. Take as close to onset of migraine as possible. One daily maximum. 4 tablet 0   rosuvastatin  (CRESTOR ) 20 MG tablet Take 1 tablet (20 mg total) by mouth daily. 90 tablet 3   SYNTHROID  100 MCG tablet Take 1 tablet (100 mcg total) by mouth daily before breakfast. 90 tablet 1   SYNTHROID  100 MCG tablet Take 1 tablet (100 mcg total) by mouth daily before breakfast. 90 tablet 1   tirzepatide  (ZEPBOUND ) 10 MG/0.5ML Pen Inject 10 mg into the skin once a week. 2 mL 0   topiramate  (TOPAMAX ) 100 MG tablet Take 1 tablet (100 mg total) by mouth at bedtime. 90 tablet 2   Ubrogepant  (UBRELVY ) 100 MG TABS Take 1 tablet (100 mg total) by mouth every 2 (two) hours as needed. Maximum 200mg  a day. 16 tablet 11   Vilazodone  HCl (VIIBRYD ) 40 MG TABS Take 1 tablet (40 mg total) by mouth daily. 90 tablet 1   No current facility-administered medications for this visit.    Allergies as of 07/12/2023 - Review Complete  07/11/2023  Allergen Reaction Noted   Bee venom Hives 08/17/2011    Vitals: There were no vitals taken for this visit. Last Weight:  Wt Readings from Last 1 Encounters:  07/11/23 200 lb (90.7 kg)   Last Height:   Ht Readings from Last 1 Encounters:  07/11/23 5' 6 (1.676 m)     Physical exam: Exam: Gen: NAD, conversant      CV: No palpitations or chest pain or SOB. VS: Breathing at a normal rate. Weight appears within normal limits. Not febrile. Eyes: Conjunctivae clear without exudates or hemorrhage  Neuro: Detailed Neurologic Exam  Speech:    Speech is normal; fluent and spontaneous with normal comprehension.  Cognition:    The patient is oriented to person, place, and time;     recent and remote memory intact;     language fluent;     normal attention, concentration, fund of knowledge Cranial Nerves:    The pupils are equal, round, and reactive to light. Visual fields are full Extraocular movements are intact.  The face is symmetric with normal sensation. The palate  elevates in the midline. Hearing intact. Voice is normal. Shoulder shrug is normal. The tongue has normal motion without fasciculations.   Coordination: normal  Gait:    No abnormalities noted or reported  Motor Observation:   no involuntary movements noted. Tone:    Appears normal  Posture:    Posture is normal. normal erect    Strength:    Strength is anti-gravity and symmetric in the upper and lower limbs.      Sensation: intact to LT, no reports of numbness or tingling or paresthesias        Assessment/Plan:  chronic migraines; Lovely patient! So thankful and kind. Placed her on ajovy  and ubrelvy  last appointment. She feels like a changed person. Appreciate her very much and so happy she is significantly better.  At baseline more than 10 migraine days a month and a total of >15 total headache days a month and now improved to 4 mild migraine days a mon and < 5 total headache days a  month.  Inject Ajovy  once monthly  Ubrelvy  prn  And if Vicodin in the past has helped, you are comfortable with it, no signs of abuse(very responsible patient) it and use it 1-2x a year I will provide that limited   Meds ordered this encounter  Medications   Fremanezumab -vfrm (AJOVY ) 225 MG/1.5ML SOAJ    Sig: Inject 225 mg into the skin every 30 (thirty) days. Please run copay card BIN# [id number] PCN# [id number] GRP# [id number] ID# [id number] EXP 01/11/2024    Dispense:  1.5 mL    Refill:  11    Please run copay card BIN# [id number] PCN# [id number] GRP# [id number] ID# [id number] EXP 01/11/2024   Ubrogepant  (UBRELVY ) 100 MG TABS    Sig: Take 1 tablet (100 mg total) by mouth every 2 (two) hours as needed. Maximum 200mg  a day. Please run copay card: BIN [id number] PCN CNRX GRP [id number] ID [id number] EXP 01/11/2024    Dispense:  16 tablet    Refill:  11    Maximum 200mg  a day. Please run copay card: [facility] [id number] PCN CNRX GRP [id number] ID [id number] EXP 01/11/2024     Cc: Perri Ronal PARAS, MD,  Baxley, Ronal PARAS, MD  Dana Epp, MD  [facility] [address] [place], [place] [address]  Phone [phone] Fax [phone]

## 2023-07-13 ENCOUNTER — Other Ambulatory Visit (HOSPITAL_BASED_OUTPATIENT_CLINIC_OR_DEPARTMENT_OTHER): Payer: Self-pay

## 2023-07-13 ENCOUNTER — Telehealth: Payer: Self-pay | Admitting: Neurology

## 2023-07-13 DIAGNOSIS — G43009 Migraine without aura, not intractable, without status migrainosus: Secondary | ICD-10-CM | POA: Insufficient documentation

## 2023-07-13 MED ORDER — AJOVY 225 MG/1.5ML ~~LOC~~ SOAJ
225.0000 mg | SUBCUTANEOUS | 11 refills | Status: AC
  Filled 2023-07-13 – 2023-11-28 (×3): qty 1.5, 30d supply, fill #0
  Filled 2024-01-18: qty 1.5, 30d supply, fill #1

## 2023-07-13 MED ORDER — UBRELVY 100 MG PO TABS
100.0000 mg | ORAL_TABLET | ORAL | 11 refills | Status: AC | PRN
  Filled 2023-07-13: qty 16, 2d supply, fill #0
  Filled 2023-10-16: qty 16, 30d supply, fill #0

## 2023-07-13 NOTE — Telephone Encounter (Signed)
 Please schedule follow up per video in  the beginning of February for med refill please dr ines thank you

## 2023-07-13 NOTE — Telephone Encounter (Signed)
 Called pt and scheduled VV for 02/13/24 @ 1:30 pm.

## 2023-07-14 DIAGNOSIS — E88819 Insulin resistance, unspecified: Secondary | ICD-10-CM | POA: Diagnosis not present

## 2023-07-14 DIAGNOSIS — E559 Vitamin D deficiency, unspecified: Secondary | ICD-10-CM | POA: Diagnosis not present

## 2023-07-14 DIAGNOSIS — I10 Essential (primary) hypertension: Secondary | ICD-10-CM | POA: Diagnosis not present

## 2023-07-14 DIAGNOSIS — E038 Other specified hypothyroidism: Secondary | ICD-10-CM | POA: Diagnosis not present

## 2023-07-14 DIAGNOSIS — E7849 Other hyperlipidemia: Secondary | ICD-10-CM | POA: Diagnosis not present

## 2023-07-15 LAB — CMP14+EGFR
ALT: 16 IU/L (ref 0–32)
AST: 17 IU/L (ref 0–40)
Albumin: 4.4 g/dL (ref 3.9–4.9)
Alkaline Phosphatase: 71 IU/L (ref 44–121)
BUN/Creatinine Ratio: 16 (ref 9–23)
BUN: 15 mg/dL (ref 6–24)
Bilirubin Total: 0.3 mg/dL (ref 0.0–1.2)
CO2: 21 mmol/L (ref 20–29)
Calcium: 9.5 mg/dL (ref 8.7–10.2)
Chloride: 105 mmol/L (ref 96–106)
Creatinine, Ser: 0.96 mg/dL (ref 0.57–1.00)
Globulin, Total: 2.3 g/dL (ref 1.5–4.5)
Glucose: 90 mg/dL (ref 70–99)
Potassium: 4.8 mmol/L (ref 3.5–5.2)
Sodium: 140 mmol/L (ref 134–144)
Total Protein: 6.7 g/dL (ref 6.0–8.5)
eGFR: 73 mL/min/1.73 (ref >59)

## 2023-07-15 LAB — HEMOGLOBIN A1C
Est. average glucose Bld gHb Est-mCnc: 103 mg/dL
Hgb A1c MFr Bld: 5.2 % (ref 4.8–5.6)

## 2023-07-15 LAB — INSULIN, RANDOM: INSULIN: 20.3 u[IU]/mL (ref 2.6–24.9)

## 2023-07-15 LAB — LIPID PANEL WITH LDL/HDL RATIO
Cholesterol, Total: 153 mg/dL (ref 100–199)
HDL: 53 mg/dL (ref >39)
LDL Chol Calc (NIH): 84 mg/dL (ref 0–99)
LDL/HDL Ratio: 1.6 ratio (ref 0.0–3.2)
Triglycerides: 85 mg/dL (ref 0–149)
VLDL Cholesterol Cal: 16 mg/dL (ref 5–40)

## 2023-07-15 LAB — VITAMIN D 25 HYDROXY (VIT D DEFICIENCY, FRACTURES): Vit D, 25-Hydroxy: 70.2 ng/mL (ref 30.0–100.0)

## 2023-07-15 LAB — VITAMIN B12: Vitamin B-12: 769 pg/mL (ref 232–1245)

## 2023-07-15 LAB — TSH: TSH: 1.18 u[IU]/mL (ref 0.450–4.500)

## 2023-07-25 ENCOUNTER — Other Ambulatory Visit: Payer: Self-pay

## 2023-07-25 ENCOUNTER — Other Ambulatory Visit (HOSPITAL_BASED_OUTPATIENT_CLINIC_OR_DEPARTMENT_OTHER): Payer: Self-pay

## 2023-07-26 ENCOUNTER — Other Ambulatory Visit (HOSPITAL_BASED_OUTPATIENT_CLINIC_OR_DEPARTMENT_OTHER): Payer: Self-pay

## 2023-08-10 ENCOUNTER — Other Ambulatory Visit (HOSPITAL_BASED_OUTPATIENT_CLINIC_OR_DEPARTMENT_OTHER): Payer: Self-pay

## 2023-08-10 ENCOUNTER — Ambulatory Visit (INDEPENDENT_AMBULATORY_CARE_PROVIDER_SITE_OTHER): Admitting: Family Medicine

## 2023-08-10 ENCOUNTER — Encounter (INDEPENDENT_AMBULATORY_CARE_PROVIDER_SITE_OTHER): Payer: Self-pay | Admitting: Family Medicine

## 2023-08-10 VITALS — BP 105/73 | HR 88 | Temp 98.1°F | Ht 66.0 in | Wt 202.0 lb

## 2023-08-10 DIAGNOSIS — Z6832 Body mass index (BMI) 32.0-32.9, adult: Secondary | ICD-10-CM | POA: Diagnosis not present

## 2023-08-10 DIAGNOSIS — K5900 Constipation, unspecified: Secondary | ICD-10-CM

## 2023-08-10 DIAGNOSIS — R632 Polyphagia: Secondary | ICD-10-CM

## 2023-08-10 DIAGNOSIS — K5909 Other constipation: Secondary | ICD-10-CM

## 2023-08-10 DIAGNOSIS — E669 Obesity, unspecified: Secondary | ICD-10-CM

## 2023-08-10 MED ORDER — TIRZEPATIDE-WEIGHT MANAGEMENT 10 MG/0.5ML ~~LOC~~ SOAJ
10.0000 mg | SUBCUTANEOUS | 0 refills | Status: DC
  Filled 2023-08-10: qty 2, 28d supply, fill #0

## 2023-08-10 NOTE — Progress Notes (Signed)
 Office: [phone]  /  Fax: [phone]  WEIGHT SUMMARY AND BIOMETRICS  Anthropometric Measurements Height: 5' 6 (1.676 m) Weight: 202 lb (91.6 kg) BMI (Calculated): 32.62 Weight at Last Visit: 200 lb Weight Lost Since Last Visit: 0 Weight Gained Since Last Visit: 2 lb Starting Weight: 273 lb Total Weight Loss (lbs): 71 lb (32.2 kg) Peak Weight: 273 lb   Body Composition  Body Fat %: 40.5 % Fat Mass (lbs): 81.8 lbs Muscle Mass (lbs): 114.2 lbs Total Body Water (lbs): 88.4 lbs Visceral Fat Rating : 10   Other Clinical Data Fasting: no Labs: no Today's Visit #: 39 Starting Date: 08/27/20    Chief Complaint: OBESITY   History of Present Illness Dana Washington is a 48 year old female with obesity who presents to discuss her treatment plan and assess her progress.  She has been following the category two eating plan approximately 85% of the time. Her exercise routine includes walking and using the elliptical for about 20 to 30 minutes, four times per week. She has gained two pounds in the last month. She feels irritated and suspects fluid retention, and reports that she is currently on her period, which has been irregular. She notes that she experiences irregular periods when under stress and due to her medication.  Her diet includes a protein bread made from protein powder, pumpkin, egg whites, and baking powder, which she uses as a substitute for eggs. She consumes a moderate dinner with plenty of protein, such as turkey kielbasa with vegetables. Despite her efforts, she feels frustrated with her weight gain and the stress from her job, which she describes as 'craziness' with staffing issues and high demands.  She is currently taking Zepbound  for her obesity and Miralax  for constipation. She has also been using a supplement that she believes has improved her bowel movements to daily, compared to once a week previously.      PHYSICAL EXAM:  Blood pressure  105/73, pulse 88, temperature 98.1 F (36.7 C), height 5' 6 (1.676 m), weight 202 lb (91.6 kg), SpO2 98%. Body mass index is 32.6 kg/m.  DIAGNOSTIC DATA REVIEWED:  BMET    Component Value Date/Time   NA 140 07/14/2023 0836   K 4.8 07/14/2023 0836   CL 105 07/14/2023 0836   CO2 21 07/14/2023 0836   GLUCOSE 90 07/14/2023 0836   GLUCOSE 95 11/03/2021 0917   BUN 15 07/14/2023 0836   CREATININE 0.96 07/14/2023 0836   CREATININE 0.88 11/03/2021 0917   CALCIUM  9.5 07/14/2023 0836   GFRNONAA >60 02/08/2021 1244   GFRNONAA 85 07/18/2020 0937   GFRAA 98 07/18/2020 0937   Lab Results  Component Value Date   HGBA1C 5.2 07/14/2023   HGBA1C 5.9 (H) 01/07/2011   Lab Results  Component Value Date   INSULIN  20.3 07/14/2023   INSULIN  28.0 (H) 08/27/2020   Lab Results  Component Value Date   TSH 1.180 07/14/2023   CBC    Component Value Date/Time   WBC 8.1 01/18/2023 0920   RBC 5.06 01/18/2023 0920   HGB 15.5 01/18/2023 0920   HGB 15.6 05/04/2021 0742   HCT 47.4 (H) 01/18/2023 0920   HCT 47.5 (H) 05/04/2021 0742   PLT 364 01/18/2023 0920   PLT 325 05/04/2021 0742   MCV 93.7 01/18/2023 0920   MCV 93 05/04/2021 0742   MCH 30.6 01/18/2023 0920   MCHC 32.7 01/18/2023 0920   RDW 13.1 01/18/2023 0920   RDW 13.9 05/04/2021 0742  Iron Studies No results found for: IRON, TIBC, FERRITIN, IRONPCTSAT Lipid Panel     Component Value Date/Time   CHOL 153 07/14/2023 0836   TRIG 85 07/14/2023 0836   HDL 53 07/14/2023 0836   CHOLHDL 2.5 11/03/2021 0917   VLDL 29 03/29/2016 1224   LDLCALC 84 07/14/2023 0836   LDLCALC 77 11/03/2021 0917   Hepatic Function Panel     Component Value Date/Time   PROT 6.7 07/14/2023 0836   ALBUMIN 4.4 07/14/2023 0836   AST 17 07/14/2023 0836   ALT 16 07/14/2023 0836   ALKPHOS 71 07/14/2023 0836   BILITOT 0.3 07/14/2023 0836   BILIDIR 0.1 09/27/2016 0941   IBILI 0.2 09/27/2016 0941      Component Value Date/Time   TSH 1.180  07/14/2023 0836   Nutritional Lab Results  Component Value Date   VD25OH 70.2 07/14/2023   VD25OH 64.9 12/30/2022   VD25OH 65.5 05/04/2021     Assessment and Plan Assessment & Plan Polyphagia Polyphagia is being managed with Zepbound . No new symptoms or concerns related to polyphagia were discussed. Travel considerations for Zepbound  were addressed, including maintaining it below body temperature and carrying it on during flights. If the medication becomes too warm, it may become less effective but is still safe to take. - Continue Zepbound  therapy. - Ensure Zepbound  is kept below body temperature during travel. - Carry Zepbound  on during flights.  Obesity Obesity management includes a category two eating plan and regular exercise. She reports adherence to the plan about 85% of the time and exercises four times per week. Weight gain of two pounds noted, likely due to fluid retention and menstrual cycle. Stress and work-related issues may impact adherence to exercise routine. Discussed strategies for maintaining healthy eating while on vacation. - Continue category two eating plan. - Encourage regular exercise as tolerated. - Follow up in four weeks.  Constipation Constipation is being managed with Miralax  and a supplement. She reports improved bowel movements, now occurring daily. No adverse effects noted from the supplement. - Continue Miralax  and supplement regimen. - Increase hydration.    She was informed of the importance of frequent follow up visits to maximize her success with intensive lifestyle modifications for her multiple health conditions.    Louann Penton, MD

## 2023-08-29 ENCOUNTER — Telehealth: Payer: Self-pay

## 2023-08-29 ENCOUNTER — Other Ambulatory Visit (HOSPITAL_BASED_OUTPATIENT_CLINIC_OR_DEPARTMENT_OTHER): Payer: Self-pay

## 2023-08-29 ENCOUNTER — Telehealth: Payer: Self-pay | Admitting: *Deleted

## 2023-08-29 ENCOUNTER — Other Ambulatory Visit (HOSPITAL_COMMUNITY): Payer: Self-pay

## 2023-08-29 ENCOUNTER — Encounter: Payer: Self-pay | Admitting: Neurology

## 2023-08-29 ENCOUNTER — Other Ambulatory Visit: Payer: Self-pay

## 2023-08-29 NOTE — Telephone Encounter (Signed)
 Pt states Ajovy  PA needed.

## 2023-08-29 NOTE — Telephone Encounter (Signed)
 Pharmacy Patient Advocate Encounter   Received notification from Physician's Office that prior authorization for Ajovy  225mg /1.85ml autoinjectors is required/requested.   Insurance verification completed.   The patient is insured through [facility] .   Per test claim: PA required; PA submitted to above mentioned insurance via Latent Key/confirmation #/EOC [id number] Status is pending

## 2023-08-29 NOTE — Telephone Encounter (Signed)
 Pharmacy Patient Advocate Encounter  Received notification from [facility] that Prior Authorization for Ubrelvy  100mg  Tablet has been APPROVED from 08/29/2023 to 08/29/2023. Unable to obtain price due to refill too soon rejection, last fill date 08/29/2023 next available fill date9/11/2023   PA #/Case ID/Reference #: [id number]

## 2023-09-01 ENCOUNTER — Other Ambulatory Visit (HOSPITAL_BASED_OUTPATIENT_CLINIC_OR_DEPARTMENT_OTHER): Payer: Self-pay

## 2023-09-07 ENCOUNTER — Other Ambulatory Visit (HOSPITAL_BASED_OUTPATIENT_CLINIC_OR_DEPARTMENT_OTHER): Payer: Self-pay

## 2023-09-07 ENCOUNTER — Ambulatory Visit (INDEPENDENT_AMBULATORY_CARE_PROVIDER_SITE_OTHER): Admitting: Family Medicine

## 2023-09-07 ENCOUNTER — Encounter (INDEPENDENT_AMBULATORY_CARE_PROVIDER_SITE_OTHER): Payer: Self-pay | Admitting: Family Medicine

## 2023-09-07 VITALS — BP 98/66 | HR 77 | Temp 97.7°F | Ht 66.0 in | Wt 201.0 lb

## 2023-09-07 DIAGNOSIS — K5909 Other constipation: Secondary | ICD-10-CM

## 2023-09-07 DIAGNOSIS — F5089 Other specified eating disorder: Secondary | ICD-10-CM | POA: Diagnosis not present

## 2023-09-07 DIAGNOSIS — K5900 Constipation, unspecified: Secondary | ICD-10-CM

## 2023-09-07 DIAGNOSIS — R632 Polyphagia: Secondary | ICD-10-CM

## 2023-09-07 DIAGNOSIS — Z6832 Body mass index (BMI) 32.0-32.9, adult: Secondary | ICD-10-CM | POA: Diagnosis not present

## 2023-09-07 DIAGNOSIS — F3289 Other specified depressive episodes: Secondary | ICD-10-CM

## 2023-09-07 DIAGNOSIS — E669 Obesity, unspecified: Secondary | ICD-10-CM

## 2023-09-07 MED ORDER — TIRZEPATIDE-WEIGHT MANAGEMENT 10 MG/0.5ML ~~LOC~~ SOAJ
10.0000 mg | SUBCUTANEOUS | 0 refills | Status: DC
  Filled 2023-09-07: qty 2, 28d supply, fill #0

## 2023-09-07 NOTE — Progress Notes (Signed)
 Office: [phone]  /  Fax: [phone]  WEIGHT SUMMARY AND BIOMETRICS  Anthropometric Measurements Height: 5' 6 (1.676 m) Weight: 201 lb (91.2 kg) BMI (Calculated): 32.46 Weight at Last Visit: 202 lb Weight Lost Since Last Visit: 1 lb Weight Gained Since Last Visit: 0 Starting Weight: 273 lb Total Weight Loss (lbs): 72 lb (32.7 kg) Peak Weight: 273 lb   Body Composition  Body Fat %: 40 % Fat Mass (lbs): 80.4 lbs Muscle Mass (lbs): 114.6 lbs Total Body Water (lbs): 87.8 lbs Visceral Fat Rating : 10   Other Clinical Data Fasting: no Labs: no Today's Visit #: 40 Starting Date: 08/27/20    Chief Complaint: OBESITY    History of Present Illness Dana Washington is a 48 year old female who presents for a follow-up on her obesity treatment plan and progress assessment.  She adheres to a category two eating plan approximately 95% of the time and engages in physical activity, including walking and using an elliptical machine five to seven days a week for 30 to 60 minutes. Despite a recent cruise, she lost a pound since her last visit one month ago and has lost a total of 72 pounds over three years. She is frustrated with the slow pace of her weight loss and aims to get under 200 pounds.  Her dietary intake includes eggs for breakfast and vitamin water daily. She indulged in chocolate croissants and macarons during her vacation but maintains a disciplined approach to her diet otherwise. She does not count calories but is mindful of her food portions, consuming high-protein yogurt with chia seeds and a balanced diet prepared by her mother.  She describes emotional eating behaviors, such as keeping candy and chocolate in her office and home as a security measure, though she rarely consumes them. She is concerned about plateauing in her weight loss journey.  Her menstrual cycle has been irregular, with her last period occurring two months ago, and she notes retaining  water due to her current period. She takes Miralax  daily, sometimes twice, for constipation. She has been using a GLP booster and has resumed taking it once a week after her cruise.  She is currently on Zepbound  10 mg per week and is able to maintain her dietary intake. Her exercise routine includes walking around a mile at the hospital and using an elliptical and bike at home.      PHYSICAL EXAM:  Blood pressure 98/66, pulse 77, temperature 97.7 F (36.5 C), height 5' 6 (1.676 m), weight 201 lb (91.2 kg), SpO2 97%. Body mass index is 32.44 kg/m.  DIAGNOSTIC DATA REVIEWED:  BMET    Component Value Date/Time   NA 140 07/14/2023 0836   K 4.8 07/14/2023 0836   CL 105 07/14/2023 0836   CO2 21 07/14/2023 0836   GLUCOSE 90 07/14/2023 0836   GLUCOSE 95 11/03/2021 0917   BUN 15 07/14/2023 0836   CREATININE 0.96 07/14/2023 0836   CREATININE 0.88 11/03/2021 0917   CALCIUM  9.5 07/14/2023 0836   GFRNONAA >60 02/08/2021 1244   GFRNONAA 85 07/18/2020 0937   GFRAA 98 07/18/2020 0937   Lab Results  Component Value Date   HGBA1C 5.2 07/14/2023   HGBA1C 5.9 (H) 01/07/2011   Lab Results  Component Value Date   INSULIN  20.3 07/14/2023   INSULIN  28.0 (H) 08/27/2020   Lab Results  Component Value Date   TSH 1.180 07/14/2023   CBC    Component Value Date/Time   WBC 8.1 01/18/2023  0920   RBC 5.06 01/18/2023 0920   HGB 15.5 01/18/2023 0920   HGB 15.6 05/04/2021 0742   HCT 47.4 (H) 01/18/2023 0920   HCT 47.5 (H) 05/04/2021 0742   PLT 364 01/18/2023 0920   PLT 325 05/04/2021 0742   MCV 93.7 01/18/2023 0920   MCV 93 05/04/2021 0742   MCH 30.6 01/18/2023 0920   MCHC 32.7 01/18/2023 0920   RDW 13.1 01/18/2023 0920   RDW 13.9 05/04/2021 0742   Iron Studies No results found for: IRON, TIBC, FERRITIN, IRONPCTSAT Lipid Panel     Component Value Date/Time   CHOL 153 07/14/2023 0836   TRIG 85 07/14/2023 0836   HDL 53 07/14/2023 0836   CHOLHDL 2.5 11/03/2021 0917   VLDL  29 03/29/2016 1224   LDLCALC 84 07/14/2023 0836   LDLCALC 77 11/03/2021 0917   Hepatic Function Panel     Component Value Date/Time   PROT 6.7 07/14/2023 0836   ALBUMIN 4.4 07/14/2023 0836   AST 17 07/14/2023 0836   ALT 16 07/14/2023 0836   ALKPHOS 71 07/14/2023 0836   BILITOT 0.3 07/14/2023 0836   BILIDIR 0.1 09/27/2016 0941   IBILI 0.2 09/27/2016 0941      Component Value Date/Time   TSH 1.180 07/14/2023 0836   Nutritional Lab Results  Component Value Date   VD25OH 70.2 07/14/2023   VD25OH 64.9 12/30/2022   VD25OH 65.5 05/04/2021     Assessment and Plan Assessment & Plan Obesity with emotional eating behaviors and polyphagia She has lost 72 pounds over three years but is currently experiencing a plateau in weight loss. Engages in regular physical activity, including walking and using an elliptical 5-7 days a week for 30-60 minutes. Follows a category two eating plan 95% of the time. Reports emotional eating behaviors and frustration with weight loss progress. Discussed the genetic component of obesity and the challenges it presents. - Refill Zepbound  10 mg per week for polyphagia. - Provide support and encouragement for emotional eating behaviors. - Continue current exercise regimen and dietary plan.  Constipation Chronic constipation managed with Miralax  once to twice daily as needed. Reports regular use of Miralax  and adequate hydration. Discussed the potential impact of GLP booster on bowel movements, though no evidence supports its efficacy or safety. She still experiences constipation despite daily use of Miralax . - Continue Miralax  once to twice daily as needed. - Encourage adequate hydration.      She was informed of the importance of frequent follow up visits to maximize her success with intensive lifestyle modifications for her multiple health conditions.    Louann Penton, MD

## 2023-09-08 ENCOUNTER — Other Ambulatory Visit (HOSPITAL_BASED_OUTPATIENT_CLINIC_OR_DEPARTMENT_OTHER): Payer: Self-pay

## 2023-09-13 ENCOUNTER — Other Ambulatory Visit (HOSPITAL_COMMUNITY): Payer: Self-pay

## 2023-09-19 ENCOUNTER — Other Ambulatory Visit: Payer: Self-pay

## 2023-09-27 ENCOUNTER — Other Ambulatory Visit (HOSPITAL_BASED_OUTPATIENT_CLINIC_OR_DEPARTMENT_OTHER): Payer: Self-pay

## 2023-09-29 ENCOUNTER — Other Ambulatory Visit (HOSPITAL_COMMUNITY): Payer: Self-pay

## 2023-09-29 NOTE — Telephone Encounter (Signed)
 Dana Washington

## 2023-10-05 ENCOUNTER — Ambulatory Visit (INDEPENDENT_AMBULATORY_CARE_PROVIDER_SITE_OTHER): Admitting: Family Medicine

## 2023-10-05 ENCOUNTER — Encounter (INDEPENDENT_AMBULATORY_CARE_PROVIDER_SITE_OTHER): Payer: Self-pay | Admitting: Family Medicine

## 2023-10-05 VITALS — BP 105/73 | HR 93 | Temp 98.4°F | Ht 66.0 in | Wt 202.0 lb

## 2023-10-05 DIAGNOSIS — F5089 Other specified eating disorder: Secondary | ICD-10-CM | POA: Diagnosis not present

## 2023-10-05 DIAGNOSIS — E669 Obesity, unspecified: Secondary | ICD-10-CM | POA: Diagnosis not present

## 2023-10-05 DIAGNOSIS — F418 Other specified anxiety disorders: Secondary | ICD-10-CM

## 2023-10-05 DIAGNOSIS — F419 Anxiety disorder, unspecified: Secondary | ICD-10-CM

## 2023-10-05 DIAGNOSIS — Z6832 Body mass index (BMI) 32.0-32.9, adult: Secondary | ICD-10-CM

## 2023-10-05 DIAGNOSIS — F3289 Other specified depressive episodes: Secondary | ICD-10-CM

## 2023-10-05 DIAGNOSIS — F32A Depression, unspecified: Secondary | ICD-10-CM

## 2023-10-06 ENCOUNTER — Other Ambulatory Visit (HOSPITAL_COMMUNITY): Payer: Self-pay

## 2023-10-06 ENCOUNTER — Other Ambulatory Visit (HOSPITAL_BASED_OUTPATIENT_CLINIC_OR_DEPARTMENT_OTHER): Payer: Self-pay

## 2023-10-06 MED ORDER — TIRZEPATIDE-WEIGHT MANAGEMENT 10 MG/0.5ML ~~LOC~~ SOAJ
10.0000 mg | SUBCUTANEOUS | 0 refills | Status: DC
  Filled 2023-10-06: qty 2, 28d supply, fill #0

## 2023-10-06 MED ORDER — COVID-19 MRNA VAC-TRIS(PFIZER) 30 MCG/0.3ML IM SUSY
0.3000 mL | PREFILLED_SYRINGE | Freq: Once | INTRAMUSCULAR | 0 refills | Status: AC
  Filled 2023-10-06: qty 0.3, 1d supply, fill #0

## 2023-10-06 NOTE — Progress Notes (Signed)
 Office: [phone]  /  Fax: [phone]  WEIGHT SUMMARY AND BIOMETRICS  Anthropometric Measurements Height: 5' 6 (1.676 m) Weight: 202 lb (91.6 kg) BMI (Calculated): 32.62 Weight at Last Visit: 201 lb Weight Lost Since Last Visit: 0 Weight Gained Since Last Visit: 1 lb Starting Weight: 273 lb Total Weight Loss (lbs): 71 lb (32.2 kg) Peak Weight: 273 lb   Body Composition  Body Fat %: 42.1 % Fat Mass (lbs): 85.2 lbs Muscle Mass (lbs): 111.4 lbs Total Body Water (lbs): 88.4 lbs Visceral Fat Rating : 10   Other Clinical Data Fasting: no Labs: no Today's Visit #: 41 Starting Date: 08/27/20    Chief Complaint: OBESITY   Discussed the use of AI scribe software for clinical note transcription with the patient, who gave verbal consent to proceed.  History of Present Illness Dana Washington is a 48 year old female with obesity who presents for a follow-up on her obesity treatment plan.  She has been adhering to a category two eating plan approximately ninety percent of the time but has gained one pound since her last visit a month ago. She feels frustrated as increased exercise and dietary adherence have not yielded expected results. She exercises twenty to sixty minutes five days a week, combining walking and using the elliptical.  She experiences stress related to her work, which she believes may impact her weight. A recent work emergency caused significant stress and a 'mini meltdown'. She also feels stressed about potentially being late for appointments and the associated fees, adding to her overall stress levels.  Her dietary habits include two boiled eggs for breakfast, yogurt with chia seeds and a meat stick for lunch, and a measured portion of meat or fish with vegetables for dinner. She is concerned about not feeling full after meals and has been trying not to eat until she is full. She consumes over a hundred grams of protein daily, including protein coffee,  and is worried about the potential impact on her kidneys.  She generally sleeps well, except when stressed about work-related issues. Occasionally, she experiences difficulty sleeping due to stress, such as replaying conversations in her head.      PHYSICAL EXAM:  Blood pressure 105/73, pulse 93, temperature 98.4 F (36.9 C), height 5' 6 (1.676 m), weight 202 lb (91.6 kg), SpO2 98%. Body mass index is 32.6 kg/m.  DIAGNOSTIC DATA REVIEWED:  BMET    Component Value Date/Time   NA 140 07/14/2023 0836   K 4.8 07/14/2023 0836   CL 105 07/14/2023 0836   CO2 21 07/14/2023 0836   GLUCOSE 90 07/14/2023 0836   GLUCOSE 95 11/03/2021 0917   BUN 15 07/14/2023 0836   CREATININE 0.96 07/14/2023 0836   CREATININE 0.88 11/03/2021 0917   CALCIUM  9.5 07/14/2023 0836   GFRNONAA >60 02/08/2021 1244   GFRNONAA 85 07/18/2020 0937   GFRAA 98 07/18/2020 0937   Lab Results  Component Value Date   HGBA1C 5.2 07/14/2023   HGBA1C 5.9 (H) 01/07/2011   Lab Results  Component Value Date   INSULIN  20.3 07/14/2023   INSULIN  28.0 (H) 08/27/2020   Lab Results  Component Value Date   TSH 1.180 07/14/2023   CBC    Component Value Date/Time   WBC 8.1 01/18/2023 0920   RBC 5.06 01/18/2023 0920   HGB 15.5 01/18/2023 0920   HGB 15.6 05/04/2021 0742   HCT 47.4 (H) 01/18/2023 0920   HCT 47.5 (H) 05/04/2021 0742   PLT 364 01/18/2023  0920   PLT 325 05/04/2021 0742   MCV 93.7 01/18/2023 0920   MCV 93 05/04/2021 0742   MCH 30.6 01/18/2023 0920   MCHC 32.7 01/18/2023 0920   RDW 13.1 01/18/2023 0920   RDW 13.9 05/04/2021 0742   Iron Studies No results found for: IRON, TIBC, FERRITIN, IRONPCTSAT Lipid Panel     Component Value Date/Time   CHOL 153 07/14/2023 0836   TRIG 85 07/14/2023 0836   HDL 53 07/14/2023 0836   CHOLHDL 2.5 11/03/2021 0917   VLDL 29 03/29/2016 1224   LDLCALC 84 07/14/2023 0836   LDLCALC 77 11/03/2021 0917   Hepatic Function Panel     Component Value Date/Time    PROT 6.7 07/14/2023 0836   ALBUMIN 4.4 07/14/2023 0836   AST 17 07/14/2023 0836   ALT 16 07/14/2023 0836   ALKPHOS 71 07/14/2023 0836   BILITOT 0.3 07/14/2023 0836   BILIDIR 0.1 09/27/2016 0941   IBILI 0.2 09/27/2016 0941      Component Value Date/Time   TSH 1.180 07/14/2023 0836   Nutritional Lab Results  Component Value Date   VD25OH 70.2 07/14/2023   VD25OH 64.9 12/30/2022   VD25OH 65.5 05/04/2021     Assessment and Plan Assessment & Plan Obesity Obesity management is ongoing. Despite adherence to the category two eating plan and regular exercise, there has been a weight gain of one pound in the last month. Stress and potential hormonal fluctuations due to menstrual irregularities may be contributing factors. She is consuming over 100 grams of protein daily, which is appropriate and not harmful to her kidneys as long as they are healthy. - Continue category two eating plan - Continue regular exercise regimen - Monitor protein intake to ensure adequate levels - Avoid excessive calorie restriction - Schedule next appointment in November - Refill Zepbound   Emotional eating behaviors Emotional eating behaviors are being managed with topiramate . She is advised to ensure adequate nutrition to support weight loss efforts. - Continue topiramate  for emotional eating behaviors  Depression Depression is being managed with Viibryd  - Continue Viibryd  for depression - Be mindful of emotional eating behaviors  Anxiety disorder Anxiety disorder is being managed with Xanax  as needed. Stress related to work and personal life is contributing to anxiety levels. - Continue Xanax  as needed for anxiety - Will continue to monitor  Follow-Up Follow-up plans discussed to ensure continued monitoring and support for weight management and emotional health. - Schedule follow-up appointment in November - Send MyChart message regarding policy issues with appointment reminders     Dana Washington  was informed of the importance of frequent follow up visits to maximize her success with intensive lifestyle modifications for her obesity and obesity related health conditions as recommended by USPSTF and CMS guidelines   Louann Penton, MD

## 2023-10-07 ENCOUNTER — Other Ambulatory Visit (HOSPITAL_BASED_OUTPATIENT_CLINIC_OR_DEPARTMENT_OTHER): Payer: Self-pay

## 2023-10-17 ENCOUNTER — Other Ambulatory Visit: Payer: Self-pay

## 2023-10-17 ENCOUNTER — Other Ambulatory Visit (HOSPITAL_COMMUNITY): Payer: Self-pay

## 2023-10-20 ENCOUNTER — Telehealth: Payer: Self-pay | Admitting: Neurology

## 2023-10-20 NOTE — Telephone Encounter (Signed)
 Called and left voicemail for patient to reschedule appointment on 02/13/24 with Dr Ines.  If patient calls back, they can be rescheduled with Dr Gregg

## 2023-10-21 ENCOUNTER — Other Ambulatory Visit: Payer: Self-pay | Admitting: Internal Medicine

## 2023-10-21 ENCOUNTER — Other Ambulatory Visit: Payer: Self-pay

## 2023-10-21 DIAGNOSIS — F3289 Other specified depressive episodes: Secondary | ICD-10-CM

## 2023-10-21 MED ORDER — TOPIRAMATE 100 MG PO TABS
100.0000 mg | ORAL_TABLET | Freq: Every day | ORAL | 2 refills | Status: AC
  Filled 2023-10-21: qty 90, 90d supply, fill #0
  Filled 2024-01-18: qty 90, 90d supply, fill #1

## 2023-10-24 ENCOUNTER — Ambulatory Visit: Admitting: Internal Medicine

## 2023-10-24 ENCOUNTER — Encounter: Payer: Self-pay | Admitting: Internal Medicine

## 2023-10-24 VITALS — BP 98/70 | HR 103 | Ht 66.0 in | Wt 199.0 lb

## 2023-10-24 DIAGNOSIS — Z8669 Personal history of other diseases of the nervous system and sense organs: Secondary | ICD-10-CM

## 2023-10-24 DIAGNOSIS — F419 Anxiety disorder, unspecified: Secondary | ICD-10-CM

## 2023-10-24 NOTE — Telephone Encounter (Signed)
 I called the patient.  Explained our office policy for how we are rescheduling Dr. Sharion patients.  Explained that because she has never seen Harlene we would like to establish her with a neurologist first.  When she sees the neurologist if they feel it is appropriate to transfer care to the nurse practitioner they could at that point.  She voiced understanding but stated she did not agree with our policy.  States that she may seek care elsewhere.  For now she plans to keep her appointment with Dr. Gregg.

## 2023-10-24 NOTE — Progress Notes (Signed)
 Patient Care Team: Perri Ronal PARAS, MD as PCP - General (Internal Medicine) Gregg Lek, MD as Consulting Physician (Neurology)  Visit Date: 10/24/23  Subjective:    Patient ID: Dana Washington , Female   DOB: 11-03-1975, 48 y.o.    MRN: [id number]   48 y.o. Female presents today to discuss migraine headache management.  Patient recently learned that Dr. Ines had left [facility].  Dr. Ines had been prescribing Ajovy  for migraine headaches for patient.  Patient was getting good relief with Ajovy .  Past medical history: In addition to migraine headaches she has hypertension and hyperlipidemia.  History of Migraine headaches treated with 225 mg Ajovy  monthly, 100 mg Ubrelvy  every 2 hours as needed, and 75 mg Nurtec daily as needed. Her preciously doctor is no longer with [facility] and she has expressed frustration with having to find a new Neurologist in order to receive Ajovy .  She says the Ajovy  is working really well. Before the Ajovy  she was getting 8-15 migraines a month.  Currently, she does not remember the last time she had a migraine headache.   Vaccine counseling: Influenza vaccine to be received through work, UTD on Covid-19 vaccine.   Past Medical History:  Diagnosis Date   Allergy    Anxiety    Colon polyps    Constipation    Diabetes mellitus without complication (HCC)    Fundic gland polyps of stomach, benign    GERD (gastroesophageal reflux disease)    Glucose intolerance (impaired glucose tolerance)    Herpes dermatitis    History of motion sickness    Hx of Hashimoto thyroiditis    Hx of sessile serrated colonic polyps 03/04/2021   Hyperlipidemia    Hypertension    Hypothyroidism, secondary    Impingement syndrome of right shoulder    Insomnia    Metabolic syndrome    Migraine    OA (osteoarthritis)    RIGHT SHOULDER AC JOINT   Obese    OCD (obsessive compulsive disorder)    PONV (postoperative nausea and vomiting)    SEVERE  after breast reduction   TMJ (dislocation of temporomandibular joint)    Vitamin D  deficiency    Vitamin D  deficiency      Family History  Problem Relation Age of Onset   High Cholesterol Mother    Obesity Mother    Thyroid  disease Mother    High Cholesterol Father    Cancer Father    Obesity Father    ADD / ADHD Sister    Migraines Sister    Heart disease Maternal Grandfather    Colon cancer Maternal Grandfather        ds in his late 2's   Heart disease Paternal Grandmother    Esophageal cancer Other        mat great aunt, non-smoker   ADD / ADHD Niece    Rectal cancer Neg Hx    Stomach cancer Neg Hx    Colon polyps Neg Hx     Social History   Social History Narrative   Lived alone until her mother moved in (retired and moved from [place]).   Her sister and father also live locally.   Caffeine : 2 cups coffee/day      Review of Systems  All other systems reviewed and are negative.       Objective:   Vitals: BP 98/70   Pulse (!) 103   Ht 5' 6 (1.676 m)   Wt 199 lb (90.3  kg)   SpO2 98%   BMI 32.12 kg/m    Physical Exam Vitals and nursing note reviewed.  Neurological:     General: No focal deficit present.     Mental Status: She is alert and oriented to person, place, and time.     Motor: Motor function is intact.     Deep Tendon Reflexes: Reflexes are normal and symmetric.     Her muscle strength is normal in the upper and lower extremities.  She is alert and oriented x 3.  Deep tendon reflexes are 2+ and symmetrical.  Extraocular movements are full.  PERRLA.  Cranial nerves II through XII are grossly intact.  Results:      Labs:       Component Value Date/Time   NA 140 07/14/2023 0836   K 4.8 07/14/2023 0836   CL 105 07/14/2023 0836   CO2 21 07/14/2023 0836   GLUCOSE 90 07/14/2023 0836   GLUCOSE 95 11/03/2021 0917   BUN 15 07/14/2023 0836   CREATININE 0.96 07/14/2023 0836   CREATININE 0.88 11/03/2021 0917   CALCIUM  9.5 07/14/2023  0836   PROT 6.7 07/14/2023 0836   ALBUMIN 4.4 07/14/2023 0836   AST 17 07/14/2023 0836   ALT 16 07/14/2023 0836   ALKPHOS 71 07/14/2023 0836   BILITOT 0.3 07/14/2023 0836   GFRNONAA >60 02/08/2021 1244   GFRNONAA 85 07/18/2020 0937   GFRAA 98 07/18/2020 0937     Lab Results  Component Value Date   WBC 8.1 01/18/2023   HGB 15.5 01/18/2023   HCT 47.4 (H) 01/18/2023   MCV 93.7 01/18/2023   PLT 364 01/18/2023    Lab Results  Component Value Date   CHOL 153 07/14/2023   HDL 53 07/14/2023   LDLCALC 84 07/14/2023   TRIG 85 07/14/2023   CHOLHDL 2.5 11/03/2021    Lab Results  Component Value Date   HGBA1C 5.2 07/14/2023     Lab Results  Component Value Date   TSH 1.180 07/14/2023          Assessment & Plan:   Migraine headaches: treated with 225 mg Ajovy  monthly, 100 mg Ubrelvy  every 2 hours as needed, and 75 mg Nurtec daily as needed.  Dr. Ines is no longer with [facility] and patient has expressed frustration with finding a new neurologist.  She is worried about how she will be able to obtain her migraine medication. She says the Ajovy  is working really well. Before the Ajovy  she was getting 8-15 migraines a day and now she does not remember the last time she had one.  Was told she could see  a different Neurologist with [facility] in May 2026.  Patient does not feel this is acceptable.  She is she is asking me to prescribe Ajovy .  I have agreed that I Will prescribe Ajovy  until further notice or she finds a new neurologist.  We may need to get prior authorization of this medication through [facility].  She will notify me well in advance before running out of the medication that she needs a refill.   Vaccine counseling: Influenza vaccine to be received through work, UTD on Covid-19 vaccine.   30 minutes spent with patient today  I,Makayla C Reid,acting as a scribe for Ronal JINNY Hailstone, MD.,have documented all relevant documentation on the behalf of Ronal JINNY Hailstone,  MD,as directed by  Ronal JINNY Hailstone, MD while in the presence of Ronal JINNY Hailstone, MD.

## 2023-10-24 NOTE — Telephone Encounter (Signed)
 Pt has called in response to voicemail from 10/9.  Pt was offered next available for Dr Gregg.  Pt is asking to be called to discuss why her care can not solely remain with Harlene, NP.  Pt states she was told by Dr Rosemarie that she could continue her care with Harlene, NP.  Phone rep did attempt to explain that as a result of the departure of Dr Ines pt's have been reassigned and that the initial appointment would need to be with MD.  Pt is asking to be called by office management to discuss the reasoning behind this.

## 2023-10-24 NOTE — Patient Instructions (Addendum)
 Patient prefers to come here to receive Ajovy  prescription for migraine prevention.  Currently she is anxious to learn that Dr. Ines has left [facility] and is concerned about how she will get future prescriptions of Ajovy .  Patient is to let us  know well in advance when she is about to run out of this migraine medications so we can make arrangements for prior authorization if needed

## 2023-10-24 NOTE — Telephone Encounter (Signed)
 Dr. Perri, I would like to request a referral to [facility] to see Dr. Skeet because Dr. Ines has left [facility]. Would you be able to submit that referral? Thanks.  Mishell

## 2023-10-25 NOTE — Telephone Encounter (Signed)
 I have never met the patient, ok to switch Dr. Chalice.

## 2023-10-25 NOTE — Telephone Encounter (Signed)
 Pt called and is now wanting to switch her appt with Dr. Gregg to Dr Chalice. Please advise.

## 2023-10-27 ENCOUNTER — Other Ambulatory Visit (HOSPITAL_BASED_OUTPATIENT_CLINIC_OR_DEPARTMENT_OTHER): Payer: Self-pay

## 2023-10-28 ENCOUNTER — Other Ambulatory Visit (HOSPITAL_BASED_OUTPATIENT_CLINIC_OR_DEPARTMENT_OTHER): Payer: Self-pay

## 2023-11-01 NOTE — Telephone Encounter (Signed)
 Fyi  Pt called returning call Inform NP message Pt will keep Scheduled appt with Dr. Camara

## 2023-11-05 LAB — HM PAP SMEAR: HM Pap smear: NEGATIVE

## 2023-11-10 ENCOUNTER — Encounter (INDEPENDENT_AMBULATORY_CARE_PROVIDER_SITE_OTHER): Payer: Self-pay | Admitting: Family Medicine

## 2023-11-10 ENCOUNTER — Ambulatory Visit (INDEPENDENT_AMBULATORY_CARE_PROVIDER_SITE_OTHER): Admitting: Family Medicine

## 2023-11-10 ENCOUNTER — Other Ambulatory Visit (HOSPITAL_BASED_OUTPATIENT_CLINIC_OR_DEPARTMENT_OTHER): Payer: Self-pay

## 2023-11-10 VITALS — BP 96/65 | HR 85 | Temp 98.8°F | Ht 66.0 in | Wt 197.0 lb

## 2023-11-10 DIAGNOSIS — Z6831 Body mass index (BMI) 31.0-31.9, adult: Secondary | ICD-10-CM | POA: Diagnosis not present

## 2023-11-10 DIAGNOSIS — E669 Obesity, unspecified: Secondary | ICD-10-CM

## 2023-11-10 DIAGNOSIS — R632 Polyphagia: Secondary | ICD-10-CM | POA: Diagnosis not present

## 2023-11-10 MED ORDER — TIRZEPATIDE-WEIGHT MANAGEMENT 10 MG/0.5ML ~~LOC~~ SOAJ
10.0000 mg | SUBCUTANEOUS | 0 refills | Status: DC
  Filled 2023-11-10: qty 2, 28d supply, fill #0

## 2023-11-10 NOTE — Progress Notes (Signed)
 Office: [phone]  /  Fax: [phone]  WEIGHT SUMMARY AND BIOMETRICS  Anthropometric Measurements Height: 5' 6 (1.676 m) Weight: 197 lb (89.4 kg) BMI (Calculated): 31.81 Weight at Last Visit: 202 lb Weight Lost Since Last Visit: 5 lb Weight Gained Since Last Visit: 0 Starting Weight: 273 lb Total Weight Loss (lbs): 76 lb (34.5 kg) Peak Weight: 237 lb   Body Composition  Body Fat %: 40.2 % Fat Mass (lbs): 79.2 lbs Muscle Mass (lbs): 111.8 lbs Total Body Water (lbs): 87.4 lbs Visceral Fat Rating : 10   Other Clinical Data Fasting: no Labs: no Today's Visit #: 42 Starting Date: 08/27/20    Chief Complaint: OBESITY   History of Present Illness Dana Washington is a 48 year old female with obesity who presents for a follow-up on her obesity treatment and progress.  She adheres to a category two eating plan 95% of the time, focusing on consuming sufficient fruits and vegetables, meeting protein requirements, and maintaining adequate hydration. She does not skip meals and generally sleeps 7-9 hours per night, about six days a week.  She engages in physical activity six days a week for 30 to 45 minutes, primarily doing cardio exercises. Recently, she started attending a spin class once a week and continues to use the elliptical and treadmill at work. She has lost 5 pounds in the last month since her last visit.  She is on Zepbound  for polyphagia, currently taking 10 mg, and finds it beneficial. No gastrointestinal issues are reported with Zepbound .  She experienced lightheadedness and dizziness after resuming her full dose of hydrochlorothiazide , which she had previously reduced. The patient reported episodes of 'blacking out' when standing up after resuming her full dose of hydrochlorothiazide , which improved after she returned to a reduced dose. She is drinking more fluids and urinates frequently, with urine being almost clear. No current lightheadedness or  dizziness is reported.  No current lightheadedness, dizziness, or gastrointestinal issues with her current medication.      PHYSICAL EXAM:  Blood pressure 96/65, pulse 85, temperature 98.8 F (37.1 C), height 5' 6 (1.676 m), weight 197 lb (89.4 kg), SpO2 99%. Body mass index is 31.8 kg/m.  DIAGNOSTIC DATA REVIEWED:  BMET    Component Value Date/Time   NA 140 07/14/2023 0836   K 4.8 07/14/2023 0836   CL 105 07/14/2023 0836   CO2 21 07/14/2023 0836   GLUCOSE 90 07/14/2023 0836   GLUCOSE 95 11/03/2021 0917   BUN 15 07/14/2023 0836   CREATININE 0.96 07/14/2023 0836   CREATININE 0.88 11/03/2021 0917   CALCIUM  9.5 07/14/2023 0836   GFRNONAA >60 02/08/2021 1244   GFRNONAA 85 07/18/2020 0937   GFRAA 98 07/18/2020 0937   Lab Results  Component Value Date   HGBA1C 5.2 07/14/2023   HGBA1C 5.9 (H) 01/07/2011   Lab Results  Component Value Date   INSULIN  20.3 07/14/2023   INSULIN  28.0 (H) 08/27/2020   Lab Results  Component Value Date   TSH 1.180 07/14/2023   CBC    Component Value Date/Time   WBC 8.1 01/18/2023 0920   RBC 5.06 01/18/2023 0920   HGB 15.5 01/18/2023 0920   HGB 15.6 05/04/2021 0742   HCT 47.4 (H) 01/18/2023 0920   HCT 47.5 (H) 05/04/2021 0742   PLT 364 01/18/2023 0920   PLT 325 05/04/2021 0742   MCV 93.7 01/18/2023 0920   MCV 93 05/04/2021 0742   MCH 30.6 01/18/2023 0920   MCHC 32.7 01/18/2023 0920  RDW 13.1 01/18/2023 0920   RDW 13.9 05/04/2021 0742   Iron Studies No results found for: IRON, TIBC, FERRITIN, IRONPCTSAT Lipid Panel     Component Value Date/Time   CHOL 153 07/14/2023 0836   TRIG 85 07/14/2023 0836   HDL 53 07/14/2023 0836   CHOLHDL 2.5 11/03/2021 0917   VLDL 29 03/29/2016 1224   LDLCALC 84 07/14/2023 0836   LDLCALC 77 11/03/2021 0917   Hepatic Function Panel     Component Value Date/Time   PROT 6.7 07/14/2023 0836   ALBUMIN 4.4 07/14/2023 0836   AST 17 07/14/2023 0836   ALT 16 07/14/2023 0836   ALKPHOS 71  07/14/2023 0836   BILITOT 0.3 07/14/2023 0836   BILIDIR 0.1 09/27/2016 0941   IBILI 0.2 09/27/2016 0941      Component Value Date/Time   TSH 1.180 07/14/2023 0836   Nutritional Lab Results  Component Value Date   VD25OH 70.2 07/14/2023   VD25OH 64.9 12/30/2022   VD25OH 65.5 05/04/2021     Assessment and Plan Assessment & Plan Obesity- improving Obesity is improving with a weight loss of five pounds in the last month. She is adhering to the category two eating plan 95% of the time, focusing on fruits, vegetables, protein intake, and hydration. She is not skipping meals and is getting adequate sleep. Exercise includes 30-45 minutes of cardio six days a week, including spin class once a week and using the elliptical and treadmill at work. She is under 200 pounds, a significant milestone. - Continue category two eating plan - Encourage continued adherence to dietary and exercise plan  Polyphagia Polyphagia is being managed with Zepbound , which she reports is effective. She is currently on 10 mg and requests a refill. No gastrointestinal issues reported with Zepbound . She is willing to continue paying for Zepbound  as it is considered the best option for her, despite the cost. Discussed potential future options with [employer] going generic, but no immediate changes are planned. If Zepbound  becomes unaffordable, alternative options such as Zepbound  in vials from [place] [facility] at a lower cost were discussed. - Refill Zepbound  10 mg - Monitor for any changes in insurance coverage or cost of Zepbound  - Discuss potential future options if Zepbound  becomes unaffordable   Dana Washington was counseled on the importance of maintaining healthy lifestyle habits, including balanced nutrition, regular physical activity, and behavioral modifications, while taking antiobesity medication.  Patient verbalized understanding that medication is an adjunct to, not a replacement for, lifestyle changes and that the  long-term success and weight maintenance depend on continued adherence to these strategies.   Dana Washington was informed of the importance of frequent follow up visits to maximize her success with intensive lifestyle modifications for her obesity and obesity related health conditions as recommended by USPSTF and CMS guidelines   Louann Penton, MD

## 2023-11-16 ENCOUNTER — Encounter: Payer: Self-pay | Admitting: Internal Medicine

## 2023-11-21 ENCOUNTER — Other Ambulatory Visit (HOSPITAL_COMMUNITY): Payer: Self-pay

## 2023-11-21 DIAGNOSIS — Z3041 Encounter for surveillance of contraceptive pills: Secondary | ICD-10-CM | POA: Diagnosis not present

## 2023-11-21 DIAGNOSIS — Z1151 Encounter for screening for human papillomavirus (HPV): Secondary | ICD-10-CM | POA: Diagnosis not present

## 2023-11-21 DIAGNOSIS — Z130 Encounter for screening for diseases of the blood and blood-forming organs and certain disorders involving the immune mechanism: Secondary | ICD-10-CM | POA: Diagnosis not present

## 2023-11-21 DIAGNOSIS — Z01419 Encounter for gynecological examination (general) (routine) without abnormal findings: Secondary | ICD-10-CM | POA: Diagnosis not present

## 2023-11-21 DIAGNOSIS — Z1389 Encounter for screening for other disorder: Secondary | ICD-10-CM | POA: Diagnosis not present

## 2023-11-21 DIAGNOSIS — Z124 Encounter for screening for malignant neoplasm of cervix: Secondary | ICD-10-CM | POA: Diagnosis not present

## 2023-11-21 MED ORDER — NORETHIN ACE-ETH ESTRAD-FE 1.5-30 MG-MCG PO TABS
1.0000 | ORAL_TABLET | Freq: Every day | ORAL | 5 refills | Status: AC
  Filled 2024-01-08: qty 84, 84d supply, fill #0

## 2023-11-22 ENCOUNTER — Other Ambulatory Visit (HOSPITAL_COMMUNITY): Payer: Self-pay

## 2023-11-23 ENCOUNTER — Encounter: Payer: Self-pay | Admitting: Internal Medicine

## 2023-11-26 ENCOUNTER — Other Ambulatory Visit (HOSPITAL_BASED_OUTPATIENT_CLINIC_OR_DEPARTMENT_OTHER): Payer: Self-pay

## 2023-11-28 ENCOUNTER — Other Ambulatory Visit (HOSPITAL_COMMUNITY): Payer: Self-pay

## 2023-12-02 ENCOUNTER — Encounter: Payer: Self-pay | Admitting: Internal Medicine

## 2023-12-09 ENCOUNTER — Other Ambulatory Visit (HOSPITAL_COMMUNITY): Payer: Self-pay

## 2023-12-12 ENCOUNTER — Ambulatory Visit (INDEPENDENT_AMBULATORY_CARE_PROVIDER_SITE_OTHER): Payer: Self-pay | Admitting: Family Medicine

## 2023-12-12 ENCOUNTER — Encounter (INDEPENDENT_AMBULATORY_CARE_PROVIDER_SITE_OTHER): Payer: Self-pay | Admitting: Family Medicine

## 2023-12-12 ENCOUNTER — Other Ambulatory Visit (HOSPITAL_BASED_OUTPATIENT_CLINIC_OR_DEPARTMENT_OTHER): Payer: Self-pay

## 2023-12-12 VITALS — BP 96/65 | HR 90 | Temp 97.9°F | Ht 66.0 in | Wt 197.0 lb

## 2023-12-12 DIAGNOSIS — K5909 Other constipation: Secondary | ICD-10-CM | POA: Diagnosis not present

## 2023-12-12 DIAGNOSIS — E669 Obesity, unspecified: Secondary | ICD-10-CM

## 2023-12-12 DIAGNOSIS — K5901 Slow transit constipation: Secondary | ICD-10-CM

## 2023-12-12 DIAGNOSIS — Z6831 Body mass index (BMI) 31.0-31.9, adult: Secondary | ICD-10-CM | POA: Diagnosis not present

## 2023-12-12 MED ORDER — TIRZEPATIDE-WEIGHT MANAGEMENT 10 MG/0.5ML ~~LOC~~ SOAJ
10.0000 mg | SUBCUTANEOUS | 1 refills | Status: DC
  Filled 2023-12-12: qty 2, 28d supply, fill #0
  Filled 2024-01-04: qty 2, 28d supply, fill #1

## 2023-12-12 NOTE — Progress Notes (Signed)
 Office: [phone]  /  Fax: [phone]  WEIGHT SUMMARY AND BIOMETRICS  Anthropometric Measurements Height: 5' 6 (1.676 m) Weight: 197 lb (89.4 kg) BMI (Calculated): 31.81 Weight at Last Visit: 197 lb Weight Lost Since Last Visit: 0 Weight Gained Since Last Visit: 0 Starting Weight: 273 lb Total Weight Loss (lbs): 76 lb (34.5 kg) Peak Weight: 237 lb   Body Composition  Body Fat %: 38.6 % Fat Mass (lbs): 76.2 lbs Muscle Mass (lbs): 114.8 lbs Total Body Water (lbs): 85.8 lbs Visceral Fat Rating : 9   Other Clinical Data Fasting: no Labs: no Today's Visit #: 61 Starting Date: 08/27/20    Chief Complaint: OBESITY    History of Present Illness Dana Washington is a 48 year old female with obesity who presents for a follow-up on her obesity treatment and progress assessment.  She follows a category two eating plan 95% of the time and engages in 30 to 45 minutes of exercise six days a week, including both cardio and strengthening exercises. She has successfully maintained her weight over the last month, including through Thanksgiving, without experiencing issues with hunger or meal skipping.  She is currently on Zepbound , having previously increased the dose, but experienced significant constipation at higher doses. She has a history of chronic constipation, which was present even before starting Zepbound . She is cautious about increasing the dose further due to concerns about severe constipation.  Her nutrition includes a protein-rich diet with protein coffee in the morning, 20 grams of protein yogurt, and meals like turkey kielbasa and sauerkraut. She consumes snacks such as Korean barbecue pork snacks and clementines, reporting a midday intake of 57 grams of protein and 5 grams of fiber, focusing on maintaining high protein intake to avoid unnecessary snacking.  She has a candy jar at work but feels no temptation to consume the sweets, opting instead for healthier  snacks. She has maintained her weight over the last month.        PHYSICAL EXAM:  Blood pressure 96/65, pulse 90, temperature 97.9 F (36.6 C), height 5' 6 (1.676 m), weight 197 lb (89.4 kg), SpO2 98%. Body mass index is 31.8 kg/m.  DIAGNOSTIC DATA REVIEWED:  BMET    Component Value Date/Time   NA 140 07/14/2023 0836   K 4.8 07/14/2023 0836   CL 105 07/14/2023 0836   CO2 21 07/14/2023 0836   GLUCOSE 90 07/14/2023 0836   GLUCOSE 95 11/03/2021 0917   BUN 15 07/14/2023 0836   CREATININE 0.96 07/14/2023 0836   CREATININE 0.88 11/03/2021 0917   CALCIUM  9.5 07/14/2023 0836   GFRNONAA >60 02/08/2021 1244   GFRNONAA 85 07/18/2020 0937   GFRAA 98 07/18/2020 0937   Lab Results  Component Value Date   HGBA1C 5.2 07/14/2023   HGBA1C 5.9 (H) 01/07/2011   Lab Results  Component Value Date   INSULIN  20.3 07/14/2023   INSULIN  28.0 (H) 08/27/2020   Lab Results  Component Value Date   TSH 1.180 07/14/2023   CBC    Component Value Date/Time   WBC 8.1 01/18/2023 0920   RBC 5.06 01/18/2023 0920   HGB 15.5 01/18/2023 0920   HGB 15.6 05/04/2021 0742   HCT 47.4 (H) 01/18/2023 0920   HCT 47.5 (H) 05/04/2021 0742   PLT 364 01/18/2023 0920   PLT 325 05/04/2021 0742   MCV 93.7 01/18/2023 0920   MCV 93 05/04/2021 0742   MCH 30.6 01/18/2023 0920   MCHC 32.7 01/18/2023 0920   RDW  13.1 01/18/2023 0920   RDW 13.9 05/04/2021 0742   Iron Studies No results found for: IRON, TIBC, FERRITIN, IRONPCTSAT Lipid Panel     Component Value Date/Time   CHOL 153 07/14/2023 0836   TRIG 85 07/14/2023 0836   HDL 53 07/14/2023 0836   CHOLHDL 2.5 11/03/2021 0917   VLDL 29 03/29/2016 1224   LDLCALC 84 07/14/2023 0836   LDLCALC 77 11/03/2021 0917   Hepatic Function Panel     Component Value Date/Time   PROT 6.7 07/14/2023 0836   ALBUMIN 4.4 07/14/2023 0836   AST 17 07/14/2023 0836   ALT 16 07/14/2023 0836   ALKPHOS 71 07/14/2023 0836   BILITOT 0.3 07/14/2023 0836   BILIDIR  0.1 09/27/2016 0941   IBILI 0.2 09/27/2016 0941      Component Value Date/Time   TSH 1.180 07/14/2023 0836   Nutritional Lab Results  Component Value Date   VD25OH 70.2 07/14/2023   VD25OH 64.9 12/30/2022   VD25OH 65.5 05/04/2021     Assessment and Plan Assessment & Plan Obesity, improved from starting BMI Obesity with a BMI of 31.8. She has maintained her weight over the last month, including during Thanksgiving, which is commendable. She follows the category two eating plan 95% of the time and exercises 30-45 minutes, six days a week, including cardio and strengthening exercises. She is on Zepbound  10 mg, which she tolerates well, but experienced constipation at higher doses. She is not skipping meals and is consuming adequate protein. Her goal is to maintain her current weight and improve body composition, as her fat has decreased and muscle has increased. She is aware of the risks of excessive weight loss and is focused on maintaining a healthy balance. - Continue Zepbound  10 mg. - Continue category two eating plan. - Continue exercise regimen. - Monitor weight and body composition. - Scheduled follow-up appointment in five weeks.  Chronic constipation Exacerbated by higher doses of Zepbound . She manages constipation with Miralax  twice a day, which she has been using prior to starting Zepbound . She is encouraged to consume high fiber foods and maintain adequate hydration. - Continue Miralax  twice a day. - Encouraged high fiber diet and adequate hydration.      Patients who are on anti-obesity medications are counseled on the importance of maintaining healthy lifestyle habits, including balanced nutrition, regular physical activity, and behavioral modifications,  Medication is an adjunct to, not a replacement for, lifestyle changes and that the long-term success and weight maintenance depend on continued adherence to these strategies.   Dana Washington was informed of the importance of  frequent follow up visits to maximize her success with intensive lifestyle modifications for her obesity and obesity related health conditions as recommended by USPSTF and CMS guidelines   Louann Penton, MD

## 2023-12-13 ENCOUNTER — Other Ambulatory Visit (HOSPITAL_BASED_OUTPATIENT_CLINIC_OR_DEPARTMENT_OTHER): Payer: Self-pay

## 2023-12-14 ENCOUNTER — Other Ambulatory Visit (HOSPITAL_COMMUNITY): Payer: Self-pay

## 2023-12-23 ENCOUNTER — Other Ambulatory Visit (HOSPITAL_COMMUNITY): Payer: Self-pay

## 2023-12-24 ENCOUNTER — Other Ambulatory Visit: Payer: Self-pay | Admitting: Internal Medicine

## 2023-12-26 ENCOUNTER — Other Ambulatory Visit (HOSPITAL_COMMUNITY): Payer: Self-pay

## 2023-12-26 ENCOUNTER — Other Ambulatory Visit: Payer: Self-pay

## 2023-12-26 MED ORDER — VILAZODONE HCL 40 MG PO TABS
40.0000 mg | ORAL_TABLET | Freq: Every day | ORAL | 1 refills | Status: AC
  Filled 2023-12-26: qty 90, 90d supply, fill #0

## 2024-01-04 ENCOUNTER — Other Ambulatory Visit (HOSPITAL_BASED_OUTPATIENT_CLINIC_OR_DEPARTMENT_OTHER): Payer: Self-pay

## 2024-01-05 ENCOUNTER — Other Ambulatory Visit (HOSPITAL_COMMUNITY): Payer: Self-pay

## 2024-01-08 ENCOUNTER — Other Ambulatory Visit (HOSPITAL_COMMUNITY): Payer: Self-pay

## 2024-01-10 NOTE — Progress Notes (Incomplete)
 "  Annual Comprehensive Physical Exam   Patient Care Team: Baxley, Ronal PARAS, MD as PCP - General (Internal Medicine) Gregg Lek, MD as Consulting Physician (Neurology)  Visit Date: 01/10/2024   No chief complaint on file.  Subjective:  Patient: Dana Washington, Female DOB: [birth year], 48 y.o. MRN: [id number] There were no vitals filed for this visit. Dana Washington is a 48 y.o. Female who presents today for her Annual Comprehensive Physical Exam. Patient has Hyperlipidemia; Impaired glucose tolerance; Generalized obesity; Hypothyroidism; History of migraine headaches; Depression with anxiety; Hypertension; OCD (obsessive compulsive disorder); Anxiety; Vitamin D  deficiency; Hashimoto's thyroiditis; S/P left knee arthroscopy; Knee pain; Prediabetes; Hx of sessile serrated colonic polyps; Other hyperlipidemia; Essential hypertension; Chronic migraine without aura without status migrainosus, not intractable; Other constipation; BMI 40.0-44.9, adult (HCC); BMI 38.0-38.9,adult; Depression; Polyphagia; Stress; BMI 32.0-32.9,adult; Obesity with beginning BMI of 45; Arterial hypotension; Insulin  resistance; and Migraine without aura and without status migrainosus, not intractable on their problem list.  History of Hypertension treated with 20-12.5 mg Lotensin  HCT daily   History of Hyperlipidemia treated with 20 mg Rosuvastatin  daily   History of Impaired Glucose Tolerance managed with diet and exercise.   History of Obesity managed with 10 mg Zepbound , diet, and exercise. Followed by Louann Penton, MD with [facility] and Silvano Pacini, [facility] with [facility].     History of Migraine Headaches treated with 225 mg Ajovy  monthly, 100 mg Ubrelvy  every 2 hours as needed, and 75 mg Nurtec daily as needed   History of Hashimoto's Thyroiditis with Resultant Hypothyroidism treated with 100 mcg Synthroid  daily.    History of GE Reflux treated with 20 mg Prilosec daily.   Labs  ***/***/*** {Labs (Optional):31667}   02/02/2023 Mammogram No mammographic evidence of malignancy. Repeat in one year.   02/27/2021 Colonoscopy Three diminutive polyps in the ascending colon. Resected and retrieved. Pathology found to be benign but precancerous.The examination was otherwise normal on direct and retroflexion views. Repeat in 5 years.   Health Maintenance  Topic Date Due   Pneumococcal Vaccine: 50+ Years (1 of 2 - PCV) Never done   Hepatitis B Vaccines 19-59 Average Risk (1 of 3 - 19+ 3-dose series) Never done   Diabetic kidney evaluation - Urine ACR  03/26/2015   OPHTHALMOLOGY EXAM  11/25/2021   Influenza Vaccine  08/12/2023   Zoster Vaccines- Shingrix (1 of 2) Never done   Mammogram  02/01/2024   HEMOGLOBIN A1C  01/14/2024   FOOT EXAM  01/20/2024   Diabetic kidney evaluation - eGFR measurement  07/13/2024   Colonoscopy  02/27/2026   Cervical Cancer Screening (HPV/Pap Cotest)  11/05/2026   DTaP/Tdap/Td (4 - Td or Tdap) 01/02/2032   COVID-19 Vaccine  Completed   HPV VACCINES  Aged Out   Meningococcal B Vaccine  Aged Out   Hepatitis C Screening  Discontinued   HIV Screening  Discontinued    {Man or Woman:32389}  Vaccine Counseling: Due for {Vaccines:32291::Influenza}; UTD on {Vaccines:32291::Influenza}  ROS Objective:  Vitals: body mass index is unknown because there is no height or weight on file.There were no vitals filed for this visit. Physical Exam  Current Outpatient Medications  Medication Instructions   Ajovy  225 mg, Subcutaneous, Every 30 days   Ajovy  225 mg, Subcutaneous, Every 30 days   ALPRAZolam  (XANAX ) 0.5 mg, Oral, 2 times daily PRN   benazepril -hydrochlorthiazide (LOTENSIN  HCT) 20-12.5 MG tablet 1 tablet, Oral, Daily   cholecalciferol (VITAMIN D3) 1,000 Units, Daily   norethindrone -ethinyl estradiol -iron (BLISOVI   FE 1.5/30) 1.5-30 MG-MCG tablet 1 tablet, Oral, Daily   norethindrone -ethinyl estradiol -iron (BLISOVI  FE 1.5/30) 1.5-30 MG-MCG  tablet 1 tablet, Oral, Daily   Nurtec 75 mg, Oral, Daily PRN, For migraines. Take as close to onset of migraine as possible. One daily maximum.   omeprazole  (PRILOSEC) 20 mg, Oral, Daily   rosuvastatin  (CRESTOR ) 20 mg, Oral, Daily   SM ClearLax 17 g, Oral, 2 times daily PRN   Synthroid  100 mcg, Oral, Daily before breakfast   Synthroid  100 mcg, Oral, Daily before breakfast   topiramate  (TOPAMAX ) 100 mg, Oral, Daily at bedtime   Ubrelvy  100 mg, Oral, Every 2 hours PRN, Maximum 200mg  a day.   Ubrelvy  100 mg, Oral, Every 2 hours PRN, Maximum 200mg  a day.   Vilazodone  HCl (VIIBRYD ) 40 mg, Oral, Daily   Zepbound  10 mg, Subcutaneous, Weekly   Past Medical History:  Diagnosis Date   Allergy    Anxiety    Colon polyps    Constipation    Diabetes mellitus without complication (HCC)    Fundic gland polyps of stomach, benign    GERD (gastroesophageal reflux disease)    Glucose intolerance (impaired glucose tolerance)    Herpes dermatitis    History of motion sickness    Hx of Hashimoto thyroiditis    Hx of sessile serrated colonic polyps 03/04/2021   Hyperlipidemia    Hypertension    Hypothyroidism, secondary    Impingement syndrome of right shoulder    Insomnia    Metabolic syndrome    Migraine    OA (osteoarthritis)    RIGHT SHOULDER AC JOINT   Obese    OCD (obsessive compulsive disorder)    PONV (postoperative nausea and vomiting)    SEVERE after breast reduction   TMJ (dislocation of temporomandibular joint)    Vitamin D  deficiency    Vitamin D  deficiency    Medical/Surgical History Narrative:  Allergic/Intolerant to: Allergies[1] *** - ***  *** - ***  *** - ***  *** - ***  *** - ***  *** - ***  *** - ***  *** - *** Other - Hx of: *** ; Surghx of: *** Past Surgical History:  Procedure Laterality Date   biopsy of lymph node  FEB 2010   BENIGN   BREAST BIOPSY Bilateral    BREAST EXCISIONAL BIOPSY Left    benign   BREAST REDUCTION SURGERY Bilateral MAY 2010    CARPAL TUNNEL RELEASE Bilateral 2012   CHONDROPLASTY Left 10/25/2017   Procedure: CHONDROPLASTY;  Surgeon: Gerome Charleston, MD;  Location: [facility];  Service: Orthopedics;  Laterality: Left;   EXCISION MASS ABDOMINAL N/A 04/14/2017   Procedure: EXCISION OF SUBCUTANEOUS LIPOMA OF LEFT UPPER ABDOMINAL WALL;  Surgeon: Belinda Cough, MD;  Location: [facility];  Service: General;  Laterality: N/A;   KNEE ARTHROSCOPY WITH MEDIAL MENISECTOMY Left 10/25/2017   Procedure: LEFT KNEE ARTHROSCOPY WITH MEDIAL AND LATERAL MENISECTOMY;  Surgeon: Gerome Charleston, MD;  Location: [facility];  Service: Orthopedics;  Laterality: Left;   LASIK  2008   REDUCTION MAMMAPLASTY Bilateral    SHOULDER ARTHROSCOPY WITH ROTATOR CUFF REPAIR AND SUBACROMIAL DECOMPRESSION Right 09/19/2012   Procedure: RIGHT SHOULDER EXAMINE UNDER ANESTHESIA, ARTHROSCOPY,  DEBRIDEMENT, SUBACROMIAL DECOMPRESSION, DISTAL CLAVICLE RESECTION AND  ROTATOR CUFF REPAIR, LABRAL REPAIR ;  Surgeon: Charleston Gerome, MD;  Location: [facility];  Service: Orthopedics;  Laterality: Right;   WISDOM TOOTH EXTRACTION  AGE 77   Family History  Problem Relation Age  of Onset   High Cholesterol Mother    Obesity Mother    Thyroid  disease Mother    High Cholesterol Father    Cancer Father    Obesity Father    ADD / ADHD Sister    Migraines Sister    Heart disease Maternal Grandfather    Colon cancer Maternal Grandfather        ds in his late 63's   Heart disease Paternal Grandmother    Esophageal cancer Other        mat great aunt, non-smoker   ADD / ADHD Niece    Rectal cancer Neg Hx    Stomach cancer Neg Hx    Colon polyps Neg Hx     Social History   Social History Narrative   Lived alone until her mother moved in (retired and moved from [place]).   Her sister and father also live locally.   Caffeine : 2 cups coffee/day   Most Recent Health Risks Assessment:   Most Recent Social  Determinants of Health (Including Hx of Tobacco, Alcohol, and Drug Use) SDOH Screenings   Depression (PHQ2-9): Low Risk (01/20/2023)  Tobacco Use: Low Risk (12/12/2023)   Social History[2] Most Recent Functional Status Assessment:     No data to display         Most Recent Fall Risk Assessment:    03/04/2022    3:45 PM  Fall Risk   Falls in the past year? 0  Number falls in past yr: 0  Injury with Fall? 0   Risk for fall due to : No Fall Risks  Follow up Falls prevention discussed     Data saved with a previous flowsheet row definition   Most Recent Anxiety/Depression Screenings:    01/20/2023    3:17 PM 03/04/2022    3:45 PM  PHQ 2/9 Scores  PHQ - 2 Score 0 0  PHQ- 9 Score 0       Data saved with a previous flowsheet row definition       No data to display         Most Recent Cognitive Screening:     No data to display         Most Recent Vision/Hearing Screenings:No results found. Results:  Studies Obtained And Personally Reviewed By Me: Diabetic Foot Exam - Simple   No data filed     {Imaging, colonoscopy, mammogram, bone density scan, echocardiogram, heart cath, stress test, CT calcium  score, etc.:32292}  Labs:  CBC w/ Differential Lab Results  Component Value Date   WBC 8.1 01/18/2023   RBC 5.06 01/18/2023   HGB 15.5 01/18/2023   HCT 47.4 (H) 01/18/2023   PLT 364 01/18/2023   MCV 93.7 01/18/2023   MCH 30.6 01/18/2023   MCHC 32.7 01/18/2023   RDW 13.1 01/18/2023   MPV 9.3 01/18/2023   LYMPHSABS 1,323 11/03/2021   MONOABS 0.7 09/04/2020   BASOSABS 57 01/18/2023    Comprehensive Metabolic Panel Lab Results  Component Value Date   NA 140 07/14/2023   K 4.8 07/14/2023   CL 105 07/14/2023   CO2 21 07/14/2023   GLUCOSE 90 07/14/2023   BUN 15 07/14/2023   CREATININE 0.96 07/14/2023   CALCIUM  9.5 07/14/2023   PROT 6.7 07/14/2023   ALBUMIN 4.4 07/14/2023   AST 17 07/14/2023   ALT 16 07/14/2023   ALKPHOS 71 07/14/2023   BILITOT 0.3  07/14/2023   EGFR 73 07/14/2023   GFRNONAA >60 02/08/2021   Lipid Panel  Lab Results  Component Value Date   CHOL 153 07/14/2023   HDL 53 07/14/2023   LDLCALC 84 07/14/2023   TRIG 85 07/14/2023   A1c Lab Results  Component Value Date   HGBA1C 5.2 07/14/2023    TSH Lab Results  Component Value Date   TSH 1.180 07/14/2023   PSA{PSA (Optional):32132} No results found for any visits on 01/24/24. Assessment & Plan:  No orders of the defined types were placed in this encounter.  No orders of the defined types were placed in this encounter.  Other Labs Reviewed today:    No follow-ups on file.   Annual Comprehensive Physical Exam done today including the all of the following: Reviewed patient's Family Medical History Reviewed patient's SDOH and reviewed tobacco, alcohol, and drug use.  Reviewed and updated list of patient's medical providers Assessment of cognitive impairment was done Assessed patient's functional ability Established a written schedule for health screening services Health Risk Assessent Completed and Reviewed  Discussed health benefits of physical activity, and encouraged her to engage in regular exercise appropriate for her age and condition.    I,Makayla C Reid,acting as a scribe for Ronal JINNY Hailstone, MD.,have documented all relevant documentation on the behalf of Ronal JINNY Hailstone, MD,as directed by  Ronal JINNY Hailstone, MD while in the presence of Ronal JINNY Hailstone, MD.  I, Ronal JINNY Hailstone, MD, have reviewed all documentation for and agree with the above Annual Wellness Visit documentation.  Ronal JINNY Hailstone, MD Internal Medicine 01/24/2024    [1]  Allergies Allergen Reactions   Bee Venom Hives  [2]  Social History Tobacco Use   Smoking status: Never   Smokeless tobacco: Never  Vaping Use   Vaping status: Never Used  Substance Use Topics   Alcohol use: Yes    Comment: social   Drug use: No   "

## 2024-01-13 ENCOUNTER — Other Ambulatory Visit (HOSPITAL_COMMUNITY): Payer: Self-pay

## 2024-01-13 MED ORDER — ZOSTER VAC RECOMB ADJUVANTED 50 MCG/0.5ML IM SUSR
0.5000 mL | Freq: Once | INTRAMUSCULAR | 0 refills | Status: AC
  Filled 2024-01-13: qty 0.5, 1d supply, fill #0

## 2024-01-16 ENCOUNTER — Encounter (INDEPENDENT_AMBULATORY_CARE_PROVIDER_SITE_OTHER): Payer: Self-pay | Admitting: Family Medicine

## 2024-01-16 ENCOUNTER — Other Ambulatory Visit (HOSPITAL_BASED_OUTPATIENT_CLINIC_OR_DEPARTMENT_OTHER): Payer: Self-pay

## 2024-01-16 ENCOUNTER — Ambulatory Visit (INDEPENDENT_AMBULATORY_CARE_PROVIDER_SITE_OTHER): Admitting: Family Medicine

## 2024-01-16 VITALS — BP 102/70 | HR 94 | Temp 98.2°F | Ht 66.0 in | Wt 201.0 lb

## 2024-01-16 DIAGNOSIS — E669 Obesity, unspecified: Secondary | ICD-10-CM | POA: Diagnosis not present

## 2024-01-16 DIAGNOSIS — F418 Other specified anxiety disorders: Secondary | ICD-10-CM | POA: Diagnosis not present

## 2024-01-16 DIAGNOSIS — Z6832 Body mass index (BMI) 32.0-32.9, adult: Secondary | ICD-10-CM

## 2024-01-16 DIAGNOSIS — K5909 Other constipation: Secondary | ICD-10-CM

## 2024-01-16 DIAGNOSIS — F439 Reaction to severe stress, unspecified: Secondary | ICD-10-CM

## 2024-01-16 MED ORDER — ZEPBOUND 12.5 MG/0.5ML ~~LOC~~ SOAJ
12.5000 mg | SUBCUTANEOUS | 0 refills | Status: DC
  Filled 2024-01-16: qty 2, 28d supply, fill #0

## 2024-01-16 NOTE — Progress Notes (Signed)
 "  Office: [phone]  /  Fax: [phone]  WEIGHT SUMMARY AND BIOMETRICS  Anthropometric Measurements Height: 5' 6 (1.676 m) Weight: 201 lb (91.2 kg) BMI (Calculated): 32.46 Weight at Last Visit: 197 lb Weight Lost Since Last Visit: 0 Weight Gained Since Last Visit: 4 lb Starting Weight: 273 lb Total Weight Loss (lbs): 72 lb (32.7 kg) Peak Weight: 273 lb   Body Composition  Body Fat %: 41.4 % Fat Mass (lbs): 83.2 lbs Muscle Mass (lbs): 112 lbs Total Body Water (lbs): 85.2 lbs Visceral Fat Rating : 10   Other Clinical Data Fasting: no Labs: no Today's Visit #: 44 Starting Date: 08/27/20    Chief Complaint: OBESITY   Discussed the use of AI scribe software for clinical note transcription with the patient, who gave verbal consent to proceed.  History of Present Illness Dana Washington is a 49 year old female who presents for obesity treatment and progress assessment.  She has been on a category two plan but has not been following it closely over the last month due to the holidays, resulting in a weight gain of four pounds. She is not currently exercising but plans to resume her classes this Thursday.  She experiences significant stress and poor sleep, which she believes have contributed to her weight gain. She typically wakes up at 4:30 AM, goes to bed at 8 PM, but often wakes up around 11 or 11:30 PM and struggles to fall back asleep, resulting in only a few hours of sleep per night.  She is currently taking Zepbound  at 10 mg weekly to manage her polyphagia and requests a refill. She reports that she has not experienced significant side effects from the medication, such as nausea or hair loss. She is considering increasing the dose due to borderline diarrhea, which she attributes to stress.  Her diet consists of a high-protein plan with a 30-gram protein shake in the morning, protein water, cottage cheese or yogurt, clementines, and a protein stick throughout  the day. She drinks 60 to 80 ounces of water daily and has a balanced dinner. She reports good hydration and is not bored with her meals.  She is experiencing significant stress at work, which has impacted her sleep and menstrual cycle, as she had a period during the second week of her birth control pack. 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 She occasionally takes Xanax  to manage her stress but rarely uses it.      PHYSICAL EXAM:  Blood pressure 102/70, pulse 94, temperature 98.2 F (36.8 C), height 5' 6 (1.676 m), weight 201 lb (91.2 kg), SpO2 99%. Body mass index is 32.44 kg/m.  DIAGNOSTIC DATA REVIEWED:  BMET    Component Value Date/Time   NA 140 07/14/2023 0836   K 4.8 07/14/2023 0836   CL 105 07/14/2023 0836   CO2 21 07/14/2023 0836   GLUCOSE 90 07/14/2023 0836   GLUCOSE 95 11/03/2021 0917   BUN 15 07/14/2023 0836   CREATININE 0.96 07/14/2023 0836   CREATININE 0.88 11/03/2021 0917   CALCIUM  9.5 07/14/2023 0836   GFRNONAA >60 02/08/2021 1244   GFRNONAA 85 07/18/2020 0937   GFRAA 98 07/18/2020 0937   Lab Results  Component Value Date   HGBA1C 5.2 07/14/2023   HGBA1C 5.9 (H) 01/07/2011   Lab Results  Component Value Date   INSULIN  20.3 07/14/2023   INSULIN  28.0 (H) 08/27/2020   Lab Results  Component Value Date   TSH  1.180 07/14/2023   CBC    Component Value Date/Time   WBC 8.1 01/18/2023 0920   RBC 5.06 01/18/2023 0920   HGB 15.5 01/18/2023 0920   HGB 15.6 05/04/2021 0742   HCT 47.4 (H) 01/18/2023 0920   HCT 47.5 (H) 05/04/2021 0742   PLT 364 01/18/2023 0920   PLT 325 05/04/2021 0742   MCV 93.7 01/18/2023 0920   MCV 93 05/04/2021 0742   MCH 30.6 01/18/2023 0920   MCHC 32.7 01/18/2023 0920   RDW 13.1 01/18/2023 0920   RDW 13.9 05/04/2021 0742   Iron Studies No results found for: IRON, TIBC, FERRITIN, IRONPCTSAT Lipid Panel     Component Value Date/Time   CHOL 153 07/14/2023 0836   TRIG 85  07/14/2023 0836   HDL 53 07/14/2023 0836   CHOLHDL 2.5 11/03/2021 0917   VLDL 29 03/29/2016 1224   LDLCALC 84 07/14/2023 0836   LDLCALC 77 11/03/2021 0917   Hepatic Function Panel     Component Value Date/Time   PROT 6.7 07/14/2023 0836   ALBUMIN 4.4 07/14/2023 0836   AST 17 07/14/2023 0836   ALT 16 07/14/2023 0836   ALKPHOS 71 07/14/2023 0836   BILITOT 0.3 07/14/2023 0836   BILIDIR 0.1 09/27/2016 0941   IBILI 0.2 09/27/2016 0941      Component Value Date/Time   TSH 1.180 07/14/2023 0836   Nutritional Lab Results  Component Value Date   VD25OH 70.2 07/14/2023   VD25OH 64.9 12/30/2022   VD25OH 65.5 05/04/2021     Assessment and Plan Assessment & Plan Obesity Gained four pounds over the last four weeks, likely due to lack of exercise and stress-related factors. Currently on Zepbound  10 mg weekly for polyphagia. No significant dietary changes reported, but stress and lack of sleep may be contributing factors. No significant side effects from Zepbound  reported, except for borderline diarrhea, which may warrant a dose increase. Discussed the variability in side effects among patients on GLP-1 agonists and the importance of finding the optimal dose for hunger control without adverse effects. - Increased Zepbound  to 12.5 mg weekly. - Continue current dietary plan focusing on high protein and portion control. - Encouraged resumption of exercise routine.   Depression and anxiety with stress Experiencing significant stress related to work and personal life, impacting sleep and potentially contributing to weight gain. Currently taking Xanax  as needed for anxiety. Stress may be contributing to irregular menstrual cycles. Discussed the importance of self-care and stress management. - Continue Xanax  as needed for anxiety. - Encouraged prioritization of self-care and stress management techniques. - Encouraged improvement of sleep hygiene.      Patients who are on anti-obesity  medications are counseled on the importance of maintaining healthy lifestyle habits, including balanced nutrition, regular physical activity, and behavioral modifications,  Medication is an adjunct to, not a replacement for, lifestyle changes and that the long-term success and weight maintenance depend on continued adherence to these strategies.   Dana Washington was informed of the importance of frequent follow up visits to maximize her success with intensive lifestyle modifications for her obesity and obesity related health conditions as recommended by USPSTF and CMS guidelines  Louann Penton, MD   "

## 2024-01-18 ENCOUNTER — Other Ambulatory Visit (HOSPITAL_COMMUNITY): Payer: Self-pay

## 2024-01-20 ENCOUNTER — Other Ambulatory Visit: Payer: 59

## 2024-01-20 DIAGNOSIS — F411 Generalized anxiety disorder: Secondary | ICD-10-CM

## 2024-01-20 DIAGNOSIS — I10 Essential (primary) hypertension: Secondary | ICD-10-CM

## 2024-01-20 DIAGNOSIS — E88819 Insulin resistance, unspecified: Secondary | ICD-10-CM

## 2024-01-20 DIAGNOSIS — Z0000 Encounter for general adult medical examination without abnormal findings: Secondary | ICD-10-CM

## 2024-01-20 DIAGNOSIS — E7849 Other hyperlipidemia: Secondary | ICD-10-CM

## 2024-01-20 DIAGNOSIS — R7303 Prediabetes: Secondary | ICD-10-CM

## 2024-01-23 ENCOUNTER — Other Ambulatory Visit

## 2024-01-23 DIAGNOSIS — Z0000 Encounter for general adult medical examination without abnormal findings: Secondary | ICD-10-CM

## 2024-01-23 DIAGNOSIS — I10 Essential (primary) hypertension: Secondary | ICD-10-CM

## 2024-01-23 DIAGNOSIS — Z1329 Encounter for screening for other suspected endocrine disorder: Secondary | ICD-10-CM

## 2024-01-23 DIAGNOSIS — E88819 Insulin resistance, unspecified: Secondary | ICD-10-CM

## 2024-01-23 DIAGNOSIS — R7303 Prediabetes: Secondary | ICD-10-CM

## 2024-01-23 DIAGNOSIS — E7849 Other hyperlipidemia: Secondary | ICD-10-CM

## 2024-01-24 ENCOUNTER — Encounter: Payer: 59 | Admitting: Internal Medicine

## 2024-02-02 ENCOUNTER — Encounter: Payer: Self-pay | Admitting: Internal Medicine

## 2024-02-07 ENCOUNTER — Other Ambulatory Visit: Payer: Self-pay | Admitting: Internal Medicine

## 2024-02-13 ENCOUNTER — Telehealth (INDEPENDENT_AMBULATORY_CARE_PROVIDER_SITE_OTHER): Admitting: Family Medicine

## 2024-02-13 ENCOUNTER — Other Ambulatory Visit (HOSPITAL_BASED_OUTPATIENT_CLINIC_OR_DEPARTMENT_OTHER): Payer: Self-pay

## 2024-02-13 ENCOUNTER — Telehealth: Admitting: Neurology

## 2024-02-13 ENCOUNTER — Encounter (INDEPENDENT_AMBULATORY_CARE_PROVIDER_SITE_OTHER): Payer: Self-pay | Admitting: Family Medicine

## 2024-02-13 VITALS — Ht 66.0 in | Wt 201.0 lb

## 2024-02-13 DIAGNOSIS — F418 Other specified anxiety disorders: Secondary | ICD-10-CM

## 2024-02-13 DIAGNOSIS — Z6832 Body mass index (BMI) 32.0-32.9, adult: Secondary | ICD-10-CM | POA: Diagnosis not present

## 2024-02-13 DIAGNOSIS — F439 Reaction to severe stress, unspecified: Secondary | ICD-10-CM

## 2024-02-13 DIAGNOSIS — R632 Polyphagia: Secondary | ICD-10-CM

## 2024-02-13 MED ORDER — ZEPBOUND 12.5 MG/0.5ML ~~LOC~~ SOAJ
12.5000 mg | SUBCUTANEOUS | 0 refills | Status: AC
  Filled 2024-02-13: qty 2, 28d supply, fill #0

## 2024-02-13 NOTE — Progress Notes (Unsigned)
"  °  Date of Visit      02/13/2024  reason for a virtual visit today is due to inclement weather     MyChart Video Visit Questions (ALL QUESTIONS MUST BE ANSWERED TO PROCEED with your TELEHEALTH VISIT)  Do you weigh yourself at home?  no          If yes, Date taken   n/a     Weight n/a  If no, do you feel like you have gained or lost weight since last visit? (How much)  maintained  Meal Plan    Category 2            Followed  100 %   What medications will you need refilled in the next 2-3 weeks and pharmacy name?  Medications are pended.     Are you exercising?  yes         Type: treadmill and elliptical.  Days per wk.    5            Minutes 20  Do you check your BP or BS at home?  no    If so, what were the last reading?  N/a  Please type yes or for the following:  Tracking calories/macros:     yes              Eating more whole foods (fruits, veggies, etc.)  yes     Getting recommended amount of protein:     yes         Drinking enough water:  yes     Are you skipping meals: no      Sleeping between 7-9 hours per night: yes     For documentation purposes only, what is your location right now? (home, work, etc.) home   Will anyone else be on the visit with you today? If so, what is their name and relation to you? no   FOR SECURITY REASONS, WE ASK THAT YOU DO NOT OPERATE A MOTOR VEHICLE WHILE ON THE VISIT WITH PROVIDER.           "

## 2024-02-13 NOTE — Progress Notes (Unsigned)
 "  Office: [phone]  /  Fax: [phone]  WEIGHT SUMMARY AND BIOMETRICS  Anthropometric Measurements Height: 5' 6 (1.676 m) Weight: 201 lb (91.2 kg) BMI (Calculated): 32.46 Weight at Last Visit: 201 lb Weight Lost Since Last Visit: 0 Weight Gained Since Last Visit: 0 Starting Weight: 273 lb Total Weight Loss (lbs): 72 lb (32.7 kg) Peak Weight: 273 lb   No data recorded Other Clinical Data Fasting: no Labs: no Today's Visit #: 45 Starting Date: 08/27/20 Comments: my chart video visit    Chief Complaint: OBESITY Virtual Visit via A/V Note  I connected with Dana Washington on 02/13/24 at  3:40 PM EST by audiovisual telehealth and verified that I am speaking with the correct person using two identifiers.  Location: Patient: home Provider: home   I discussed the limitations, risks, security and privacy concerns of performing an evaluation and management service by AV telehealth and the availability of in person appointments. I also discussed with the patient that there may be a patient responsible charge related to this service. The patient expressed understanding and agreed to proceed.    History of Present Illness Dana Washington is a 49 year old female who presents for a follow-up on her obesity treatment and progress assessment.  She has been adhering to the category two eating plan and has maintained her weight since the last visit. Her diet includes more fruits and vegetables, adequate protein intake, and she ensures proper hydration without skipping meals. She exercises on the treadmill and elliptical five days a week for twenty minutes each session.  She is currently prescribed Zepbound  for polyphagia at a dose of 12.5 mg weekly and requests a refill. She has only had two doses since the increase to 12.5 mg and does not attribute any changes to the medication yet.  She reports not snacking as much as before and feels she might not be eating enough,  despite eating regularly throughout the day. Her meals include a protein breakfast bar, a Clementine, cottage cheese or high protein yogurt, and a normal portion of dinner, often soup. She tracks her protein and fluid intake diligently, consuming 80 to 100 ounces of water daily.  She experiences stress primarily from work and family. She reports that she enjoys spin class. She alternates between the elliptical, treadmill, and bike for exercise, and attends spin class once a week when weather permits.      PHYSICAL EXAM:  Height 5' 6 (1.676 m), weight 201 lb (91.2 kg). Body mass index is 32.44 kg/m.  DIAGNOSTIC DATA REVIEWED BY MYSELF TODAY:  BMET    Component Value Date/Time   NA 140 07/14/2023 0836   K 4.8 07/14/2023 0836   CL 105 07/14/2023 0836   CO2 21 07/14/2023 0836   GLUCOSE 90 07/14/2023 0836   GLUCOSE 95 11/03/2021 0917   BUN 15 07/14/2023 0836   CREATININE 0.96 07/14/2023 0836   CREATININE 0.88 11/03/2021 0917   CALCIUM  9.5 07/14/2023 0836   GFRNONAA >60 02/08/2021 1244   GFRNONAA 85 07/18/2020 0937   GFRAA 98 07/18/2020 0937   Lab Results  Component Value Date   HGBA1C 5.2 07/14/2023   HGBA1C 5.9 (H) 01/07/2011   Lab Results  Component Value Date   INSULIN  20.3 07/14/2023   INSULIN  28.0 (H) 08/27/2020   Lab Results  Component Value Date   TSH 1.180 07/14/2023   CBC    Component Value Date/Time   WBC 8.1 01/18/2023 0920   RBC 5.06 01/18/2023 0920  HGB 15.5 01/18/2023 0920   HGB 15.6 05/04/2021 0742   HCT 47.4 (H) 01/18/2023 0920   HCT 47.5 (H) 05/04/2021 0742   PLT 364 01/18/2023 0920   PLT 325 05/04/2021 0742   MCV 93.7 01/18/2023 0920   MCV 93 05/04/2021 0742   MCH 30.6 01/18/2023 0920   MCHC 32.7 01/18/2023 0920   RDW 13.1 01/18/2023 0920   RDW 13.9 05/04/2021 0742   Iron Studies No results found for: IRON, TIBC, FERRITIN, IRONPCTSAT Lipid Panel     Component Value Date/Time   CHOL 153 07/14/2023 0836   TRIG 85 07/14/2023  0836   HDL 53 07/14/2023 0836   CHOLHDL 2.5 11/03/2021 0917   VLDL 29 03/29/2016 1224   LDLCALC 84 07/14/2023 0836   LDLCALC 77 11/03/2021 0917   Hepatic Function Panel     Component Value Date/Time   PROT 6.7 07/14/2023 0836   ALBUMIN 4.4 07/14/2023 0836   AST 17 07/14/2023 0836   ALT 16 07/14/2023 0836   ALKPHOS 71 07/14/2023 0836   BILITOT 0.3 07/14/2023 0836   BILIDIR 0.1 09/27/2016 0941   IBILI 0.2 09/27/2016 0941      Component Value Date/Time   TSH 1.180 07/14/2023 0836   Nutritional Lab Results  Component Value Date   VD25OH 70.2 07/14/2023   VD25OH 64.9 12/30/2022   VD25OH 65.5 05/04/2021     Assessment and Plan Assessment & Plan Morbid obesity Weight maintenance since last visit. Adhering to category two eating plan, exercising regularly, and consuming adequate protein and hydration. Recent increase in Zepbound  to 12.5 mg weekly. No significant weight change attributed to medication yet, as it takes four doses to notice changes. Increased physical activity due to snow shoveling may have contributed to weight loss. - Continue Zepbound  12.5 mg weekly. - Monitor weight and dietary intake. - Encouraged continuation of current exercise regimen. - Allowed weight check at the clinic x 1 - Continue either Cat 2 plan or journaling plan with 1200-1400 kcal and 85+ gm protein daily  Polyphagia Managed with Zepbound . No significant changes in appetite reported. Current dose of Zepbound  is 12.5 mg weekly. - Continue Zepbound  12.5 mg weekly.  Depression and anxiety with stress Stress primarily related to workplace and family. Managing stress through exercise, particularly spin class, which she finds beneficial for stress relief. - Encouraged continuation of exercise, particularly spin class, for stress management. - Provided support and encouragement for stress management strategies.      Patients who are on anti-obesity medications are counseled on the importance of  maintaining healthy lifestyle habits, including balanced nutrition, regular physical activity, and behavioral modifications,  Medication is an adjunct to, not a replacement for, lifestyle changes and that the long-term success and weight maintenance depend on continued adherence to these strategies.   Pretty was informed of the importance of frequent follow up visits to maximize her success with intensive lifestyle modifications for her obesity and obesity related health conditions as recommended by USPSTF and CMS guidelines  Louann Penton, MD   "

## 2024-02-14 ENCOUNTER — Other Ambulatory Visit (HOSPITAL_BASED_OUTPATIENT_CLINIC_OR_DEPARTMENT_OTHER): Payer: Self-pay

## 2024-03-12 ENCOUNTER — Ambulatory Visit (INDEPENDENT_AMBULATORY_CARE_PROVIDER_SITE_OTHER): Admitting: Family Medicine

## 2024-04-09 ENCOUNTER — Ambulatory Visit (INDEPENDENT_AMBULATORY_CARE_PROVIDER_SITE_OTHER): Admitting: Family Medicine

## 2024-04-30 ENCOUNTER — Ambulatory Visit: Admitting: Internal Medicine

## 2024-05-22 ENCOUNTER — Ambulatory Visit: Admitting: Neurology
